# Patient Record
Sex: Male | Born: 1935 | Race: White | Hispanic: No | Marital: Married | Smoking: Former smoker
Health system: Southern US, Community
[De-identification: ages and names within clinical notes are randomized; demographics above are authoritative.]

## PROBLEM LIST (undated history)

## (undated) DIAGNOSIS — I639 Cerebral infarction, unspecified: Secondary | ICD-10-CM

## (undated) DIAGNOSIS — C4490 Unspecified malignant neoplasm of skin, unspecified: Secondary | ICD-10-CM

## (undated) DIAGNOSIS — IMO0002 Reserved for concepts with insufficient information to code with codable children: Secondary | ICD-10-CM

## (undated) DIAGNOSIS — I2510 Atherosclerotic heart disease of native coronary artery without angina pectoris: Secondary | ICD-10-CM

## (undated) DIAGNOSIS — Z8719 Personal history of other diseases of the digestive system: Secondary | ICD-10-CM

## (undated) DIAGNOSIS — Z8673 Personal history of transient ischemic attack (TIA), and cerebral infarction without residual deficits: Secondary | ICD-10-CM

## (undated) DIAGNOSIS — L309 Dermatitis, unspecified: Secondary | ICD-10-CM

## (undated) DIAGNOSIS — I509 Heart failure, unspecified: Secondary | ICD-10-CM

## (undated) DIAGNOSIS — I10 Essential (primary) hypertension: Secondary | ICD-10-CM

## (undated) DIAGNOSIS — R945 Abnormal results of liver function studies: Secondary | ICD-10-CM

## (undated) DIAGNOSIS — I499 Cardiac arrhythmia, unspecified: Secondary | ICD-10-CM

## (undated) DIAGNOSIS — R7989 Other specified abnormal findings of blood chemistry: Secondary | ICD-10-CM

## (undated) DIAGNOSIS — J189 Pneumonia, unspecified organism: Secondary | ICD-10-CM

## (undated) DIAGNOSIS — I4891 Unspecified atrial fibrillation: Secondary | ICD-10-CM

## (undated) DIAGNOSIS — E785 Hyperlipidemia, unspecified: Secondary | ICD-10-CM

## (undated) HISTORY — DX: Abnormal results of liver function studies: R94.5

## (undated) HISTORY — DX: Essential (primary) hypertension: I10

## (undated) HISTORY — PX: TONSILLECTOMY: SUR1361

## (undated) HISTORY — DX: Personal history of other diseases of the digestive system: Z87.19

## (undated) HISTORY — DX: Cardiac arrhythmia, unspecified: I49.9

## (undated) HISTORY — DX: Dermatitis, unspecified: L30.9

## (undated) HISTORY — DX: Other specified abnormal findings of blood chemistry: R79.89

## (undated) HISTORY — DX: Unspecified malignant neoplasm of skin, unspecified: C44.90

## (undated) HISTORY — DX: Unspecified atrial fibrillation: I48.91

## (undated) HISTORY — DX: Cerebral infarction, unspecified: I63.9

## (undated) HISTORY — DX: Hyperlipidemia, unspecified: E78.5

## (undated) HISTORY — DX: Reserved for concepts with insufficient information to code with codable children: IMO0002

## (undated) HISTORY — DX: Heart failure, unspecified: I50.9

## (undated) HISTORY — PX: APPENDECTOMY: SHX54

## (undated) HISTORY — DX: Personal history of transient ischemic attack (TIA), and cerebral infarction without residual deficits: Z86.73

## (undated) HISTORY — DX: Atherosclerotic heart disease of native coronary artery without angina pectoris: I25.10

---

## 1997-10-17 HISTORY — PX: CORONARY ANGIOPLASTY: SHX604

## 1998-04-18 ENCOUNTER — Ambulatory Visit (HOSPITAL_COMMUNITY): Admission: RE | Admit: 1998-04-18 | Discharge: 1998-04-18 | Payer: Self-pay | Admitting: Dentistry

## 1998-06-15 ENCOUNTER — Inpatient Hospital Stay (HOSPITAL_COMMUNITY): Admission: EM | Admit: 1998-06-15 | Discharge: 1998-06-18 | Payer: Self-pay | Admitting: Internal Medicine

## 1999-04-08 ENCOUNTER — Observation Stay (HOSPITAL_COMMUNITY): Admission: AD | Admit: 1999-04-08 | Discharge: 1999-04-09 | Payer: Self-pay | Admitting: Cardiology

## 1999-04-08 HISTORY — PX: CORONARY ANGIOPLASTY: SHX604

## 1999-04-08 HISTORY — PX: CARDIAC CATHETERIZATION: SHX172

## 1999-04-08 HISTORY — PX: CORONARY ANGIOPLASTY WITH STENT PLACEMENT: SHX49

## 2002-05-04 ENCOUNTER — Encounter: Admission: RE | Admit: 2002-05-04 | Discharge: 2002-05-04 | Payer: Self-pay | Admitting: Urology

## 2002-05-04 ENCOUNTER — Encounter: Payer: Self-pay | Admitting: Urology

## 2003-08-29 ENCOUNTER — Encounter: Payer: Self-pay | Admitting: Internal Medicine

## 2003-11-18 DIAGNOSIS — Z8673 Personal history of transient ischemic attack (TIA), and cerebral infarction without residual deficits: Secondary | ICD-10-CM

## 2003-11-18 HISTORY — DX: Personal history of transient ischemic attack (TIA), and cerebral infarction without residual deficits: Z86.73

## 2004-07-14 ENCOUNTER — Emergency Department (HOSPITAL_COMMUNITY): Admission: EM | Admit: 2004-07-14 | Discharge: 2004-07-14 | Payer: Self-pay | Admitting: Emergency Medicine

## 2004-09-25 ENCOUNTER — Ambulatory Visit: Payer: Self-pay | Admitting: Internal Medicine

## 2004-10-08 ENCOUNTER — Encounter: Admission: RE | Admit: 2004-10-08 | Discharge: 2004-10-08 | Payer: Self-pay | Admitting: Cardiology

## 2004-10-09 ENCOUNTER — Ambulatory Visit (HOSPITAL_COMMUNITY): Admission: RE | Admit: 2004-10-09 | Discharge: 2004-10-09 | Payer: Self-pay | Admitting: Cardiology

## 2005-06-23 ENCOUNTER — Ambulatory Visit: Payer: Self-pay | Admitting: Internal Medicine

## 2005-06-25 ENCOUNTER — Ambulatory Visit: Payer: Self-pay | Admitting: Internal Medicine

## 2005-07-01 ENCOUNTER — Ambulatory Visit: Payer: Self-pay | Admitting: Internal Medicine

## 2006-03-25 ENCOUNTER — Ambulatory Visit: Payer: Self-pay | Admitting: Internal Medicine

## 2006-11-13 ENCOUNTER — Ambulatory Visit: Payer: Self-pay | Admitting: Internal Medicine

## 2007-08-17 ENCOUNTER — Encounter: Payer: Self-pay | Admitting: Internal Medicine

## 2007-08-17 ENCOUNTER — Telehealth (INDEPENDENT_AMBULATORY_CARE_PROVIDER_SITE_OTHER): Payer: Self-pay | Admitting: *Deleted

## 2007-08-27 ENCOUNTER — Encounter: Payer: Self-pay | Admitting: Internal Medicine

## 2007-09-02 ENCOUNTER — Ambulatory Visit: Payer: Self-pay | Admitting: Internal Medicine

## 2007-09-02 DIAGNOSIS — I4891 Unspecified atrial fibrillation: Secondary | ICD-10-CM | POA: Insufficient documentation

## 2007-09-02 DIAGNOSIS — I10 Essential (primary) hypertension: Secondary | ICD-10-CM | POA: Insufficient documentation

## 2007-09-02 DIAGNOSIS — I252 Old myocardial infarction: Secondary | ICD-10-CM | POA: Insufficient documentation

## 2007-09-02 DIAGNOSIS — E785 Hyperlipidemia, unspecified: Secondary | ICD-10-CM | POA: Insufficient documentation

## 2007-09-03 ENCOUNTER — Ambulatory Visit: Payer: Self-pay | Admitting: Internal Medicine

## 2007-09-06 ENCOUNTER — Telehealth: Payer: Self-pay | Admitting: Internal Medicine

## 2007-09-07 ENCOUNTER — Ambulatory Visit: Payer: Self-pay | Admitting: Internal Medicine

## 2007-09-07 LAB — CONVERTED CEMR LAB
ALT: 24 units/L (ref 0–53)
AST: 30 units/L (ref 0–37)
Albumin: 3.2 g/dL — ABNORMAL LOW (ref 3.5–5.2)
Alkaline Phosphatase: 66 units/L (ref 39–117)
BUN: 25 mg/dL — ABNORMAL HIGH (ref 6–23)
Bacteria, UA: NEGATIVE
Basophils Absolute: 0 10*3/uL (ref 0.0–0.1)
Basophils Relative: 0.8 % (ref 0.0–1.0)
Bilirubin Urine: NEGATIVE
Bilirubin, Direct: 0.2 mg/dL (ref 0.0–0.3)
CO2: 32 meq/L (ref 19–32)
Calcium: 9 mg/dL (ref 8.4–10.5)
Chloride: 107 meq/L (ref 96–112)
Cholesterol: 117 mg/dL (ref 0–200)
Creatinine, Ser: 1.3 mg/dL (ref 0.4–1.5)
Crystals: NEGATIVE
Eosinophils Absolute: 0.1 10*3/uL (ref 0.0–0.6)
Eosinophils Relative: 2.4 % (ref 0.0–5.0)
GFR calc Af Amer: 68 mL/min
GFR calc non Af Amer: 56 mL/min
Glucose, Bld: 104 mg/dL — ABNORMAL HIGH (ref 70–99)
HCT: 42.1 % (ref 39.0–52.0)
HDL: 51.3 mg/dL (ref >39.0)
Hemoglobin: 14.1 g/dL (ref 13.0–17.0)
Ketones, ur: NEGATIVE mg/dL
LDL Cholesterol: 58 mg/dL (ref 0–99)
Leukocytes, UA: NEGATIVE
Lymphocytes Relative: 27.1 % (ref 12.0–46.0)
MCHC: 33.5 g/dL (ref 30.0–36.0)
MCV: 95.2 fL (ref 78.0–100.0)
Monocytes Absolute: 1.1 10*3/uL — ABNORMAL HIGH (ref 0.2–0.7)
Monocytes Relative: 17.5 % — ABNORMAL HIGH (ref 3.0–11.0)
Neutro Abs: 3.3 10*3/uL (ref 1.4–7.7)
Neutrophils Relative %: 52.2 % (ref 43.0–77.0)
Nitrite: NEGATIVE
Platelets: 181 10*3/uL (ref 150–400)
Potassium: 4.9 meq/L (ref 3.5–5.1)
RBC: 4.42 M/uL (ref 4.22–5.81)
RDW: 14.2 % (ref 11.5–14.6)
Sodium: 144 meq/L (ref 135–145)
Specific Gravity, Urine: 1.025 (ref 1.000–1.03)
TSH: 1.43 microintl units/mL (ref 0.35–5.50)
Total Bilirubin: 1 mg/dL (ref 0.3–1.2)
Total CHOL/HDL Ratio: 2.3
Total Protein, Urine: 30 mg/dL — AB
Total Protein: 6.4 g/dL (ref 6.0–8.3)
Triglycerides: 37 mg/dL (ref 0–149)
Uric Acid, Serum: 5.8 mg/dL (ref 2.4–7.0)
Urine Glucose: NEGATIVE mg/dL
Urobilinogen, UA: 1 (ref 0.0–1.0)
VLDL: 7 mg/dL (ref 0–40)
WBC: 6.2 10*3/uL (ref 4.5–10.5)
pH: 6.5 (ref 5.0–8.0)

## 2007-09-08 ENCOUNTER — Encounter (INDEPENDENT_AMBULATORY_CARE_PROVIDER_SITE_OTHER): Payer: Self-pay | Admitting: *Deleted

## 2007-09-08 ENCOUNTER — Encounter: Payer: Self-pay | Admitting: Internal Medicine

## 2007-09-21 ENCOUNTER — Encounter: Admission: RE | Admit: 2007-09-21 | Discharge: 2007-09-21 | Payer: Self-pay | Admitting: Internal Medicine

## 2007-09-22 ENCOUNTER — Telehealth (INDEPENDENT_AMBULATORY_CARE_PROVIDER_SITE_OTHER): Payer: Self-pay | Admitting: *Deleted

## 2007-09-22 ENCOUNTER — Encounter (INDEPENDENT_AMBULATORY_CARE_PROVIDER_SITE_OTHER): Payer: Self-pay | Admitting: *Deleted

## 2007-12-16 ENCOUNTER — Telehealth: Payer: Self-pay | Admitting: Internal Medicine

## 2008-01-04 ENCOUNTER — Telehealth (INDEPENDENT_AMBULATORY_CARE_PROVIDER_SITE_OTHER): Payer: Self-pay | Admitting: *Deleted

## 2008-05-15 ENCOUNTER — Telehealth (INDEPENDENT_AMBULATORY_CARE_PROVIDER_SITE_OTHER): Payer: Self-pay | Admitting: *Deleted

## 2008-05-17 ENCOUNTER — Ambulatory Visit: Payer: Self-pay | Admitting: Internal Medicine

## 2008-05-17 DIAGNOSIS — M545 Low back pain: Secondary | ICD-10-CM

## 2008-08-31 ENCOUNTER — Telehealth (INDEPENDENT_AMBULATORY_CARE_PROVIDER_SITE_OTHER): Payer: Self-pay | Admitting: *Deleted

## 2008-09-05 ENCOUNTER — Encounter (INDEPENDENT_AMBULATORY_CARE_PROVIDER_SITE_OTHER): Payer: Self-pay | Admitting: *Deleted

## 2008-09-06 ENCOUNTER — Ambulatory Visit: Payer: Self-pay | Admitting: Internal Medicine

## 2008-09-11 ENCOUNTER — Encounter (INDEPENDENT_AMBULATORY_CARE_PROVIDER_SITE_OTHER): Payer: Self-pay | Admitting: *Deleted

## 2008-09-11 LAB — CONVERTED CEMR LAB
Albumin: 3.6 g/dL (ref 3.5–5.2)
Alkaline Phosphatase: 73 units/L (ref 39–117)
BUN: 20 mg/dL (ref 6–23)
Cholesterol: 135 mg/dL (ref 0–200)
Eosinophils Absolute: 0.2 10*3/uL (ref 0.0–0.7)
Eosinophils Relative: 3.5 % (ref 0.0–5.0)
GFR calc Af Amer: 58 mL/min
GFR calc non Af Amer: 48 mL/min
HCT: 44.4 % (ref 39.0–52.0)
HDL: 49.4 mg/dL (ref >39.0)
MCHC: 34.1 g/dL (ref 30.0–36.0)
MCV: 94.4 fL (ref 78.0–100.0)
Monocytes Absolute: 0.8 10*3/uL (ref 0.1–1.0)
Platelets: 151 10*3/uL (ref 150–400)
Potassium: 4.9 meq/L (ref 3.5–5.1)
RDW: 14.3 % (ref 11.5–14.6)
Sodium: 143 meq/L (ref 135–145)
TSH: 1.84 microintl units/mL (ref 0.35–5.50)
Triglycerides: 65 mg/dL (ref 0–149)

## 2008-09-21 ENCOUNTER — Telehealth (INDEPENDENT_AMBULATORY_CARE_PROVIDER_SITE_OTHER): Payer: Self-pay | Admitting: *Deleted

## 2008-10-16 ENCOUNTER — Ambulatory Visit: Payer: Self-pay | Admitting: Internal Medicine

## 2008-10-16 DIAGNOSIS — G459 Transient cerebral ischemic attack, unspecified: Secondary | ICD-10-CM

## 2008-10-16 DIAGNOSIS — L259 Unspecified contact dermatitis, unspecified cause: Secondary | ICD-10-CM

## 2008-10-16 DIAGNOSIS — I2510 Atherosclerotic heart disease of native coronary artery without angina pectoris: Secondary | ICD-10-CM

## 2008-12-21 ENCOUNTER — Emergency Department (HOSPITAL_COMMUNITY): Admission: EM | Admit: 2008-12-21 | Discharge: 2008-12-21 | Payer: Self-pay | Admitting: *Deleted

## 2009-03-06 ENCOUNTER — Encounter: Payer: Self-pay | Admitting: Internal Medicine

## 2009-10-09 ENCOUNTER — Ambulatory Visit: Payer: Self-pay | Admitting: Internal Medicine

## 2009-10-12 LAB — CONVERTED CEMR LAB
Alkaline Phosphatase: 94 units/L (ref 39–117)
BUN: 20 mg/dL (ref 6–23)
Basophils Absolute: 0 10*3/uL (ref 0.0–0.1)
Bilirubin, Direct: 0.2 mg/dL (ref 0.0–0.3)
Chloride: 107 meq/L (ref 96–112)
Cholesterol: 137 mg/dL (ref 0–200)
Eosinophils Absolute: 0.2 10*3/uL (ref 0.0–0.7)
LDL Cholesterol: 75 mg/dL (ref 0–99)
Lymphocytes Relative: 29.2 % (ref 12.0–46.0)
MCHC: 33.2 g/dL (ref 30.0–36.0)
Neutrophils Relative %: 48.5 % (ref 43.0–77.0)
Platelets: 118 10*3/uL — ABNORMAL LOW (ref 150.0–400.0)
Potassium: 5.2 meq/L — ABNORMAL HIGH (ref 3.5–5.1)
RDW: 14.4 % (ref 11.5–14.6)
Triglycerides: 58 mg/dL (ref 0.0–149.0)
VLDL: 11.6 mg/dL (ref 0.0–40.0)

## 2009-10-18 ENCOUNTER — Ambulatory Visit: Payer: Self-pay | Admitting: Internal Medicine

## 2009-10-18 DIAGNOSIS — R972 Elevated prostate specific antigen [PSA]: Secondary | ICD-10-CM | POA: Insufficient documentation

## 2009-10-19 ENCOUNTER — Telehealth (INDEPENDENT_AMBULATORY_CARE_PROVIDER_SITE_OTHER): Payer: Self-pay | Admitting: *Deleted

## 2009-11-26 ENCOUNTER — Encounter: Payer: Self-pay | Admitting: Internal Medicine

## 2010-06-26 ENCOUNTER — Ambulatory Visit: Payer: Self-pay | Admitting: Cardiology

## 2010-07-24 ENCOUNTER — Ambulatory Visit: Payer: Self-pay | Admitting: Cardiology

## 2010-08-08 ENCOUNTER — Telehealth (INDEPENDENT_AMBULATORY_CARE_PROVIDER_SITE_OTHER): Payer: Self-pay | Admitting: *Deleted

## 2010-08-28 ENCOUNTER — Ambulatory Visit: Payer: Self-pay | Admitting: Cardiology

## 2010-09-27 ENCOUNTER — Ambulatory Visit: Payer: Self-pay | Admitting: Cardiology

## 2010-10-28 ENCOUNTER — Ambulatory Visit: Payer: Self-pay | Admitting: Cardiology

## 2010-10-29 ENCOUNTER — Ambulatory Visit: Payer: Self-pay | Admitting: Internal Medicine

## 2010-10-29 ENCOUNTER — Telehealth (INDEPENDENT_AMBULATORY_CARE_PROVIDER_SITE_OTHER): Payer: Self-pay | Admitting: *Deleted

## 2010-10-29 LAB — CONVERTED CEMR LAB
Bilirubin Urine: NEGATIVE
Blood in Urine, dipstick: NEGATIVE
Glucose, Urine, Semiquant: NEGATIVE
Protein, U semiquant: NEGATIVE
Specific Gravity, Urine: 1.015
pH: 5.5

## 2010-10-30 ENCOUNTER — Telehealth: Payer: Self-pay | Admitting: Internal Medicine

## 2010-10-30 ENCOUNTER — Ambulatory Visit: Payer: Self-pay | Admitting: Internal Medicine

## 2010-10-30 LAB — CONVERTED CEMR LAB: Potassium: 4.9 meq/L (ref 3.5–5.1)

## 2010-11-05 ENCOUNTER — Encounter: Payer: Self-pay | Admitting: Internal Medicine

## 2010-11-05 ENCOUNTER — Ambulatory Visit: Payer: Self-pay | Admitting: Internal Medicine

## 2010-11-05 DIAGNOSIS — Z85828 Personal history of other malignant neoplasm of skin: Secondary | ICD-10-CM

## 2010-11-27 ENCOUNTER — Ambulatory Visit: Payer: Self-pay | Admitting: Cardiology

## 2010-12-19 NOTE — Assessment & Plan Note (Signed)
Summary: YEARLY EXAM///SPH   Vital Signs:  Patient profile:   77 year old male Height:      72.75 inches Weight:      206.4 pounds BMI:     27.52 Temp:     97.7 degrees F oral Pulse rate:   54 / minute Resp:     14 per minute BP sitting:   134 / 80  (left arm) Cuff size:   large  Vitals Entered By: Shonna Chock CMA (November 05, 2010 1:10 PM) CC: CPX and discuss labs (copy given)    CC:  CPX and discuss labs (copy given) .  History of Present Illness:    Here for Medicare AWV: 1.Risk factors based on Past M, S, F history:see Diagnoses( chart updated) 2.Physical Activities: treadmill ,Abmaster, weights 60 min 4X/week. 3.Depression/mood: no issues 4.Hearing: whisper  heard @ 6 ft  5.ADL's: no limitations  6.Fall Risk: no imbalance or co-ordination issues 7.Home Safety: home safety proofed 8.Height, weight, &visual acuity:wall chart read @ 6 ft w/o lenses 9.Counseling: POA & Living Will in place 10.Labs ordered based on risk factors:results reviewed  11. Referral Coordination: none requested 12. Care Plan:see Instructions 13. Cognitive Assessment: Oriented X 3; memory & recall good  ; "WORLD" spelled backwards;mood & affect normal.       Hyperlipidemia Follow-Up:  The patient reports intermittent UE  muscle aches and itching (from eczema0, but denies GI upset, abdominal pain, flushing, constipation, diarrhea, and fatigue.  Other symptoms include occasional  pedal edema.  The patient denies the following symptoms: chest pain/pressure, exercise intolerance, dypsnea, palpitations, and syncope.  Compliance with medications (by patient report) has been near 100%.  Dietary compliance has been excellent.         Hypertension Follow-Up: The patient denies lightheadedness, urinary frequency, and headaches.  Compliance with medications (by patient report) has been near 100%.  Adjunctive measures currently used by the patient include salt restriction.  BP well controlled @ MD  visits.  Preventive Screening-Counseling & Management  Alcohol-Tobacco     Alcohol drinks/day: 3     Smoking Status: quit > 6 months     Year Started: 1959     Year Quit: 1970     Cigars/week: 5-6/week  Caffeine-Diet-Exercise     Caffeine use/day: < 1 /day  Hep-HIV-STD-Contraception     Dental Visit-last 6 months yes     Sun Exposure-Excessive: no  Safety-Violence-Falls     Seat Belt Use: yes      Blood Transfusions:  no.        Travel History:  [place]   09/2010.    Current Medications (verified): 1)  Coumadin 5 Mg  Tabs (Warfarin Sodium) .Marland Kitchen.. 1 By Mouth Qd 2)  Toprol Xl 100 Mg  Tb24 (Metoprolol Succinate) .... 1/2 Tab Qd 3)  Lipitor 10 Mg  Tabs (Atorvastatin Calcium) .Marland Kitchen.. 1 By Mouth Qd 4)  Advil 200 Mg Tabs (Ibuprofen) .Marland Kitchen.. 1 By Mouth As Needed  Allergies: 1)  ! Pcn  Past History:  Past Medical History: Eczema complicated by cellulitis 1999 ; elevated PSA, PMH of  (790.93), Dr Vonita Moss, Urology ( 4.2 in 2003) Atrial fibrillation Hyperlipidemia: NMR Lipoprofile: LDL 77( 1187/569), HDL 48, TG 65.LDL goal = < 100, ideally < 70. Hypertension Myocardial infarction, PMH  of X 2 in  1996 & 1998; ? Transient ischemic attack, PMH  of  2005, presentation  as diplopia Skin cancer,PMH of "pre Melanoma", Dr Donzetta Starch  Past Surgical History: Appendectomy; Atherectomy 1995, Dr  Delfin Edis; PTCA/stent 1999; Virtual colonoscopy negative  2009 Tonsillectomy  Family History: Father:died of  PNA @ 71 Mother: negative , d @ 68 Siblings: bro: MI in 18s  Social History: no diet Occupation: Psychologist, educational of 2 companies Married Alcohol use-yes: socially Regular exercise-yes: treadmill 30 min 3-4X/week, weights, Ab  Master Former Smoker: cigars in his 30s Smoking Status:  quit > 6 months Caffeine use/day:  < 1 /day Dental Care w/in 6 mos.:  yes Sun Exposure-Excessive:  no Seat Belt Use:  yes Blood Transfusions:  no  Review of Systems  The patient denies anorexia,  fever, weight loss, weight gain, hoarseness, prolonged cough, hemoptysis, melena, hematochezia, severe indigestion/heartburn, hematuria, suspicious skin lesions, unusual weight change, enlarged lymph nodes, and angioedema.    Physical Exam  General:  Appears younger than age,well-nourished; alert,appropriate and cooperative throughout examination Head:  Normocephalic and atraumatic without obvious abnormalities.  Eyes:  No corneal or conjunctival inflammation noted. EOMI. Perrla. Funduscopic exam benign, without hemorrhages, exudates or papilledema. Vision grossly normal. Ears:  External ear exam shows no significant lesions or deformities.  Otoscopic examination reveals clear canals, tympanic membranes are intact bilaterally without bulging, retraction, inflammation or discharge. Hearing is grossly normal bilaterally. Nose:  External nasal examination shows no deformity or inflammation. Nasal mucosa are pink and moist without lesions or exudates. Septal dislocation & deviation Mouth:  Oral mucosa and oropharynx without lesions or exudates.  Teeth in good repair. Neck:  No deformities, masses, or tenderness noted. Lungs:  Normal respiratory effort, chest expands symmetrically. Lungs are clear to auscultation, no crackles or wheezes. Heart:  bradycardia,MINIMALLY  irregular rhythm, and grade1.5- 2  /6 systolic murmur.   Abdomen:  Bowel sounds positive,abdomen soft and non-tender without masses, organomegaly or hernias noted. Prostate:  see PSA  Msk:  No deformity or scoliosis noted of thoracic or lumbar spine.   Pulses:  R and L carotid,radial,dorsalis pedis and posterior tibial pulses are full and equal bilaterally Extremities:  No clubbing or  cyanosis. 1+ ankle  edema. Contractions of 2 nd toes with callus formation.   Minimal crepitus of knees  Neurologic:  alert & oriented X3 and DTRs symmetrical  but 1/2+ Skin:  Diffuse low grade Eczema Cervical Nodes:  No lymphadenopathy noted Axillary  Nodes:  No palpable lymphadenopathy Psych:  memory intact for recent and remote, normally interactive, and good eye contact.     Impression & Recommendations:  Problem # 1:  PREVENTIVE HEALTH CARE (ICD-V70.0)  Orders: Medicare -1st Annual Wellness Visit [phone])  Problem # 2:  HYPERLIPIDEMIA (ICD-272.4) LDL @ goal of < 100 His updated medication list for this problem includes:    Lipitor 10 Mg Tabs (Atorvastatin calcium) .Marland Kitchen... 1 by mouth qd  Problem # 3:  HYPERTENSION (ICD-401.9) controlled His updated medication list for this problem includes:    Toprol Xl 100 Mg Tb24 (Metoprolol succinate) .Marland Kitchen... 1/2 tab qd  Problem # 4:  PSA, INCREASED (ICD-790.93) minimally elevated vs 2003 (4.2). No further evaluation indicated  Complete Medication List: 1)  Coumadin 5 Mg Tabs (Warfarin sodium) .Marland Kitchen.. 1 by mouth qd 2)  Toprol Xl 100 Mg Tb24 (Metoprolol succinate) .... 1/2 tab qd 3)  Lipitor 10 Mg Tabs (Atorvastatin calcium) .Marland Kitchen.. 1 by mouth qd 4)  Advil 200 Mg Tabs (Ibuprofen) .Marland Kitchen.. 1 by mouth as needed  Patient Instructions: 1)  Drink to thirst , up to 40 oz of water / day.  Monitor of PSA is not needed. Consider Tai Chi if you are concerned  about balance. 2)  Check your Blood Pressure regularly. If it is above: 135/85 ON AVERAGE you should make an appointment.If muscle aches persist , please have Vitamin D level & CPK drawn to assess cause(Code: 729.1).   Orders Added: 1)  Medicare -1st Annual Wellness Visit [G0438] 2)  Est. Patient Level III [16109]     Appended Document: YEARLY EXAM///SPH   Immunizations Administered:  Influenza Vaccine # 1:    Vaccine Type: Fluvax Non-MCR    Site: left deltoid    Mfr: Sanofi Pasteur    Dose: 0.5 ml    Route: IM    Given by: Shonna Chock CMA    Exp. Date: 05/17/2011    Lot #: [id number]   Laboratory Results    Stool - Occult Blood Hemmoccult #1: negative Hemoccult #2: negative Hemoccult #3: negative  Kit Test Internal QC: Positive    (Normal Range: Negative)

## 2010-12-19 NOTE — Progress Notes (Signed)
Summary: K+ results  Phone Note Call from Patient Call back at [phone]   Summary of Call: Patient spouse was notified of his K+ results today and they would like to know what was the cause of this and what do you recommend. Please advise. Initial call taken by: Lucious Groves CMA,  October 30, 2010 3:20 PM  Follow-up for Phone Call        Much improved ; avoid citrus or K+ containing products. Bring in all supplements to appt. Kidney function is normal. Follow-up by: Marga Melnick MD,  October 30, 2010 3:42 PM  Additional Follow-up for Phone Call Additional follow up Details #1::        Patient notified. Lucious Groves CMA  October 30, 2010 3:52 PM

## 2010-12-19 NOTE — Progress Notes (Signed)
Summary: ORDERS FOR 12/5 LAB??  Phone Note Call from Patient   Summary of Call: PATIENT HAS YEARLY EXAM FOR 12/12---WHAT ORDERS DO I NEED FOR 12/5 LAB??  ADVISED PT TO CHECK WITH INS TO SEE IF THEY WILL PAY FOR LABS BEFORE PHYSICAL Initial call taken by: Jerolyn Shin,  August 08, 2010 11:05 AM  Follow-up for Phone Call        Lipid/ Hep 272.4/995.20, BMP 401.9, CBCD,TSH, PSA, Stool Cards, Udip V70.0 Follow-up by: Shonna Chock CMA,  August 08, 2010 1:27 PM  Additional Follow-up for Phone Call Additional follow up Details #1::        ENTERED INFO FOR LABS Additional Follow-up by: Jerolyn Shin,  August 12, 2010 10:33 AM

## 2010-12-19 NOTE — Progress Notes (Signed)
Summary: Lab Concerns  Phone Note Outgoing Call Call back at Home Phone [phone]   Call placed by: Shonna Chock CMA,  October 29, 2010 3:19 PM Call placed to: Patient Details for Reason: K+ 6.3, ref range 3.5-5.1 Summary of Call: Per Dr.Hopper contact patient and have him go to [facility] to recheck potassium tomorrow (276.7).   Left message on machine for patient to return call when avaliable, Reason for call:  Discuss Elevated potassium, Dr.Hopper reviewed med list, no meds would cause increase in potassium. Patient to have recheck tomorrow at [facility]Chrae [facility] CMA  October 29, 2010 3:21 PM   Follow-up for Phone Call        Pt aware appt scheduled...........Marland KitchenFelecia Deloach CMA  October 29, 2010 3:33 PM

## 2010-12-19 NOTE — Consult Note (Signed)
Summary: [facility]   Imported By: Lanelle Bal 12/10/2009 09:43:39  _____________________________________________________________________  External Attachment:    Type:   Image     Comment:   External Document

## 2010-12-19 NOTE — Miscellaneous (Signed)
Summary: Orders Update  Clinical Lists Changes  Orders: Added new Service order of EKG w/ Interpretation (93000) - Signed 

## 2011-01-07 ENCOUNTER — Encounter (INDEPENDENT_AMBULATORY_CARE_PROVIDER_SITE_OTHER): Payer: Medicare Other

## 2011-01-07 DIAGNOSIS — Z7901 Long term (current) use of anticoagulants: Secondary | ICD-10-CM

## 2011-02-03 ENCOUNTER — Encounter: Payer: Self-pay | Admitting: *Deleted

## 2011-02-03 DIAGNOSIS — I4891 Unspecified atrial fibrillation: Secondary | ICD-10-CM

## 2011-02-04 ENCOUNTER — Ambulatory Visit (INDEPENDENT_AMBULATORY_CARE_PROVIDER_SITE_OTHER): Payer: Medicare Other | Admitting: *Deleted

## 2011-02-04 DIAGNOSIS — Z7901 Long term (current) use of anticoagulants: Secondary | ICD-10-CM

## 2011-02-04 LAB — POCT INR: INR: 1.9

## 2011-02-25 ENCOUNTER — Ambulatory Visit (INDEPENDENT_AMBULATORY_CARE_PROVIDER_SITE_OTHER): Payer: MEDICARE | Admitting: Cardiology

## 2011-02-25 ENCOUNTER — Encounter: Payer: Self-pay | Admitting: *Deleted

## 2011-02-25 ENCOUNTER — Encounter: Payer: Self-pay | Admitting: Cardiology

## 2011-02-25 VITALS — BP 128/80 | HR 45 | Wt 205.6 lb

## 2011-02-25 DIAGNOSIS — I2510 Atherosclerotic heart disease of native coronary artery without angina pectoris: Secondary | ICD-10-CM

## 2011-02-25 DIAGNOSIS — I4891 Unspecified atrial fibrillation: Secondary | ICD-10-CM

## 2011-02-25 NOTE — Progress Notes (Signed)
Arthur [name] is seen today for followup visit. He had a history of atherectomy of the left anterior descending in 1993 after anterior septal myocardial infarction. He had inferior myocardial infarction in December of 1998 with angioplasty right coronary artery. He subsequently had repeat catheterization in may of 2000 and we stented the proximal left anterior descending at that time. Overall, he has done well since then with no recurrence of angina.  He developed atrial fibrillation in 1995 and has been on warfarin anticoagulation since that time. He's been on beta blockers because of his ischemic heart disease and is gradually has slowing of his ventricular response to his atrial fibrillation. In 2010, he had a Holter monitor which was satisfactory but did have a slow ventricular response. He remains asymptomatic but today on his EKG, he is a heart rate of 45.   Current Outpatient Prescriptions  Medication Sig Dispense Refill  . atorvastatin (LIPITOR) 10 MG tablet Take 10 mg by mouth daily.        Marland Kitchen ibuprofen (ADVIL,MOTRIN) 200 MG tablet Take 200 mg by mouth every 6 (six) hours as needed.        . metoprolol (TOPROL-XL) 50 MG 24 hr tablet Take 50 mg by mouth daily. 1 tablet AM and 25mg  QHS       . warfarin (COUMADIN) 5 MG tablet Take 5 mg by mouth daily.          Allergies  Allergen Reactions  . Penicillins     Patient Active Problem List  Diagnoses  . HYPERLIPIDEMIA  . HYPERTENSION  . MYOCARDIAL INFARCTION, HX OF  . C A D  . ATRIAL FIBRILLATION  . ECZEMA  . LOW BACK PAIN, MILD  . PSA, INCREASED  . TRANSIENT ISCHEMIC ATTACK, HX OF  . SKIN CANCER, HX OF    History  Smoking status  . Former Smoker  . Quit date: 11/18/1967  Smokeless tobacco  . Never Used    History  Alcohol Use  . Yes    Family History  Problem Relation Age of Onset  . Heart attack Mother 56    MI  . Pneumonia Father     Review of Systems:   The patient denies any heat or cold intolerance.  No weight  gain or weight loss.  The patient denies headaches or blurry vision.  There is no cough or sputum production.  The patient denies dizziness.  There is no hematuria or hematochezia.  The patient denies any muscle aches or arthritis.  The patient denies any rash.  The patient denies frequent falling or instability.  There is no history of depression or anxiety.  All other systems were reviewed and are negative.   Physical Exam:   His weight is 205. Blood pressure is 150/80 sitting, 120/80 standing, heart rate is 45 and irregular he is in atrial fibrillation at 45 he may have a nodal escape rhythm.The head is normocephalic and atraumatic.  Pupils are equally round and reactive to light.  Sclerae nonicteric.  Conjunctiva is clear.  Oropharynx is unremarkable.  There's adequate oral airway.  Neck is supple there are no masses.  Thyroid is not enlarged.  There is no lymphadenopathy.  Lungs are clear.  Chest is symmetric.  Heart shows a regular rate and rhythm.  S1 and S2 are normal.  There is no murmur click or gallop.  Abdomen is soft normal bowel sounds.  There is no organomegaly.  Genital and rectal deferred.  Extremities are without edema.  Peripheral pulses  are adequate.  Neurologically intact.  Full range of motion.  The patient is not depressed.  Skin is warm and dry.  Assessment / Plan:

## 2011-02-25 NOTE — Assessment & Plan Note (Signed)
We'll continue his warfarin anticoagulation. I'll decrease his Toprol 25 mg per day. I will see him again in one month to assess his underlying rhythm and ventricular response to his atrial fibrillation.

## 2011-02-25 NOTE — Assessment & Plan Note (Signed)
We'll continue to monitor him for recurrent ischemia. It's been 12 years since he had a cardiac catheterization. His last stress Cardiolite study was in 2010 but overall he's been satisfactory. He's not had any syncope from his atrial fibrillation I will try to decrease his Toprol to 25 mg per day. I will have him see Dr. Excell Seltzer in followup in 6 months but I will see him back in one month to make sure he is tolerating the decrease in beta blockers.

## 2011-03-03 ENCOUNTER — Other Ambulatory Visit: Payer: Self-pay | Admitting: *Deleted

## 2011-03-03 DIAGNOSIS — I4891 Unspecified atrial fibrillation: Secondary | ICD-10-CM

## 2011-03-03 MED ORDER — WARFARIN SODIUM 5 MG PO TABS: 5.0000 mg | ORAL_TABLET | Freq: Every day | ORAL | Status: DC

## 2011-03-04 ENCOUNTER — Ambulatory Visit (INDEPENDENT_AMBULATORY_CARE_PROVIDER_SITE_OTHER): Payer: MEDICARE | Admitting: *Deleted

## 2011-03-04 DIAGNOSIS — I4891 Unspecified atrial fibrillation: Secondary | ICD-10-CM

## 2011-03-04 LAB — POCT INR: INR: 2.5

## 2011-03-06 ENCOUNTER — Encounter: Payer: Self-pay | Admitting: Cardiology

## 2011-03-21 ENCOUNTER — Other Ambulatory Visit (HOSPITAL_COMMUNITY): Payer: Self-pay | Admitting: Surgery

## 2011-03-21 DIAGNOSIS — D0362 Melanoma in situ of left upper limb, including shoulder: Secondary | ICD-10-CM

## 2011-03-24 ENCOUNTER — Telehealth: Payer: Self-pay | Admitting: Cardiology

## 2011-03-24 ENCOUNTER — Other Ambulatory Visit (HOSPITAL_COMMUNITY): Payer: Self-pay | Admitting: Surgery

## 2011-03-24 ENCOUNTER — Encounter (HOSPITAL_COMMUNITY)
Admission: RE | Admit: 2011-03-24 | Discharge: 2011-03-24 | Disposition: A | Discharge status: Home / Self Care | Payer: MEDICARE | Source: Ambulatory Visit | Attending: Surgery | Admitting: Surgery

## 2011-03-24 DIAGNOSIS — C4360 Malignant melanoma of unspecified upper limb, including shoulder: Secondary | ICD-10-CM

## 2011-03-24 DIAGNOSIS — D0362 Melanoma in situ of left upper limb, including shoulder: Secondary | ICD-10-CM

## 2011-03-24 MED ORDER — TECHNETIUM TC 99M SULFUR COLLOID FILTERED
0.5000 | Freq: Once | INTRAVENOUS | Status: AC | PRN
  Administered 2011-03-24: 0.5 via INTRADERMAL

## 2011-03-26 ENCOUNTER — Encounter: Payer: Self-pay | Admitting: Cardiology

## 2011-03-26 ENCOUNTER — Ambulatory Visit (INDEPENDENT_AMBULATORY_CARE_PROVIDER_SITE_OTHER): Payer: MEDICARE | Admitting: Cardiology

## 2011-03-26 DIAGNOSIS — I2510 Atherosclerotic heart disease of native coronary artery without angina pectoris: Secondary | ICD-10-CM

## 2011-03-26 DIAGNOSIS — I4891 Unspecified atrial fibrillation: Secondary | ICD-10-CM

## 2011-03-26 NOTE — Assessment & Plan Note (Signed)
His atrial fibrillation as been steady. By decreasing his beta blocker, he has less bradycardia. I think is reasonable to continue to follow him along at this point in time. He may need followup echocardiogram in 6-8 months

## 2011-03-26 NOTE — Progress Notes (Signed)
Subjective:   Arthur [name] comes in today for followup visit. He has chronic atrial fibrillation as well as ischemic heart disease. I've recently decrease his Toprol to 25 mg per day and he seen back for followup. Overall, he's doing well from a cardiac standpoint of his heart rate has increased to 56 beats per minute. Since his last visit, he seen a dermatologist and has a newly discovered melanoma on his left forearm. He is planned surgical excision in approximately one week.  He has known ischemic heart disease. He had an atherectomy of the left anterior descending in 1993 after anterior septal myocardial infarction. He had an inferior myocardial infarction December 1998 with angioplasty the right coronary artery. In may of 2000, stent was placed in his proximal left anterior descending. He has done well since that time. His history of hypercholesterolemia, a known gallstone, history of TIA in 2006, a history of hypertension.  Current Outpatient Prescriptions  Medication Sig Dispense Refill  . atorvastatin (LIPITOR) 10 MG tablet Take 10 mg by mouth daily.        Marland Kitchen ibuprofen (ADVIL,MOTRIN) 200 MG tablet Take 200 mg by mouth as needed. occasionally      . metoprolol (TOPROL-XL) 50 MG 24 hr tablet Take 25 mg by mouth daily.       Marland Kitchen warfarin (COUMADIN) 5 MG tablet Take 1 tablet (5 mg total) by mouth daily.  90 tablet  3    Allergies  Allergen Reactions  . Penicillins     Patient Active Problem List  Diagnoses  . HYPERLIPIDEMIA  . HYPERTENSION  . MYOCARDIAL INFARCTION, HX OF  . C A D  . ATRIAL FIBRILLATION  . ECZEMA  . LOW BACK PAIN, MILD  . PSA, INCREASED  . TRANSIENT ISCHEMIC ATTACK, HX OF  . SKIN CANCER, HX OF    History  Smoking status  . Former Smoker  . Types: Cigarettes, Cigars  . Quit date: 11/18/1967  Smokeless tobacco  . Never Used  Comment: 1-2 cigars a day    History  Alcohol Use  . Yes    2 glasses of wine daily    Family History  Problem Relation Age of Onset    . Heart attack Mother 80    MI  . Pneumonia Father     Review of Systems:   The patient denies any heat or cold intolerance.  No weight gain or weight loss.  The patient denies headaches or blurry vision.  There is no cough or sputum production.  The patient denies dizziness.  There is no hematuria or hematochezia.  The patient denies any muscle aches or arthritis.  The patient denies any rash.  The patient denies frequent falling or instability.  There is no history of depression or anxiety.  All other systems were reviewed and are negative.   Physical Exam:   He is in atrial fibrillation and has been a regular rate and rhythm. Heart rate is 56 vital signs were reviewed.The head is normocephalic and atraumatic.  Pupils are equally round and reactive to light.  Sclerae nonicteric.  Conjunctiva is clear.  Oropharynx is unremarkable.  There's adequate oral airway.  Neck is supple there are no masses.  Thyroid is not enlarged.  There is no lymphadenopathy.  Lungs are clear.  Chest is symmetric.  Heart shows air regular rate and rhythm.  S1 and S2 are normal.  There is no murmur click or gallop.  Abdomen is soft normal bowel sounds.  There is no organomegaly.  Genital and rectal deferred.  Extremities are without edema. Biopsy lesion is noted on the left forearm. Peripheral pulses are adequate.  Neurologically intact.  Full range of motion.  The patient is not depressed.  Skin is warm and dry. Assessment / Plan:

## 2011-03-26 NOTE — Assessment & Plan Note (Signed)
Overall, he is tall well for established coronary disease. I will have him see Dr. Excell Seltzer in followup. His last stress Cardiolite study was in April of 2010.

## 2011-03-28 ENCOUNTER — Telehealth: Payer: Self-pay | Admitting: Cardiology

## 2011-03-28 NOTE — Telephone Encounter (Signed)
[facility], OFFICE ,EKG, STRESS, ECHO, CHEST XRAY,    FAX [phone]  SURG. WED 04/02/11

## 2011-04-01 ENCOUNTER — Encounter: Payer: Medicare Other | Admitting: *Deleted

## 2011-04-01 ENCOUNTER — Ambulatory Visit
Admission: RE | Admit: 2011-04-01 | Discharge: 2011-04-01 | Disposition: A | Discharge status: Home / Self Care | Payer: MEDICARE | Source: Ambulatory Visit | Attending: Surgery | Admitting: Surgery

## 2011-04-01 ENCOUNTER — Other Ambulatory Visit: Payer: Self-pay | Admitting: Surgery

## 2011-04-01 ENCOUNTER — Encounter (HOSPITAL_BASED_OUTPATIENT_CLINIC_OR_DEPARTMENT_OTHER)
Admission: RE | Admit: 2011-04-01 | Discharge: 2011-04-01 | Disposition: A | Discharge status: Home / Self Care | Payer: Medicare Other | Source: Ambulatory Visit | Attending: Surgery | Admitting: Surgery

## 2011-04-01 DIAGNOSIS — Z01811 Encounter for preprocedural respiratory examination: Secondary | ICD-10-CM

## 2011-04-01 LAB — PROTIME-INR: Prothrombin Time: 21.6 seconds — ABNORMAL HIGH (ref 11.6–15.2)

## 2011-04-01 LAB — BASIC METABOLIC PANEL
Calcium: 9.5 mg/dL (ref 8.4–10.5)
GFR calc Af Amer: >60 mL/min (ref >60)
GFR calc non Af Amer: 58 mL/min — ABNORMAL LOW (ref >60)
Glucose, Bld: 84 mg/dL (ref 70–99)

## 2011-04-01 NOTE — Letter (Signed)
September 07, 2007    Colleen Can. Deborah Chalk, M.D.  [address]  [place], [place] [address]   RE:  Arthur, [name]  MRN:  [id number]  /  DOB:  May 02, 1925   Dear Arthur [name],   I saw Arthur [name] for a complete physical examination on September 02, 2007; enclosed is a copy of that dictation. I have reviewed your  correspondence concerning colonoscopy. You had categorically stated you  did not want him to come off Coumadin to permit a screening colonoscopy.  You felt that the risk of cardiovascular or neurovascular event was too  great off Coumadin.   Two of his 6 stool cards were positive for blood;he did not exhibit  anemia. A copy of his full labs were sent to him.   I will request a virtual colonoscopy be authorized by his insurance  company in view of these facts. I feel it is critical to rule out any  colon lesion in view of the Hemoccult positive stools.   If any significant lesions were identified @  virtual colonoscopy then  further evaluation would require a colonoscopy under Lovenox coverage  off coumadin in an inpatient setting.    Sincerely,      Arthur [name]. Alwyn Ren, MD,FACP,FCCP  Electronically Signed    [facility]  DD: 09/07/2007  DT: 09/08/2007  Job #: [id number]

## 2011-04-02 ENCOUNTER — Ambulatory Visit (HOSPITAL_COMMUNITY)
Admission: RE | Admit: 2011-04-02 | Discharge: 2011-04-02 | Disposition: A | Discharge status: Home / Self Care | Payer: Medicare Other | Source: Ambulatory Visit | Attending: Surgery | Admitting: Surgery

## 2011-04-02 ENCOUNTER — Other Ambulatory Visit (INDEPENDENT_AMBULATORY_CARE_PROVIDER_SITE_OTHER): Payer: Self-pay | Admitting: Surgery

## 2011-04-02 ENCOUNTER — Ambulatory Visit (HOSPITAL_BASED_OUTPATIENT_CLINIC_OR_DEPARTMENT_OTHER)
Admission: RE | Admit: 2011-04-02 | Discharge: 2011-04-02 | Disposition: A | Discharge status: Home / Self Care | Payer: Medicare Other | Source: Ambulatory Visit | Attending: Surgery | Admitting: Surgery

## 2011-04-02 DIAGNOSIS — C4360 Malignant melanoma of unspecified upper limb, including shoulder: Secondary | ICD-10-CM

## 2011-04-02 DIAGNOSIS — I10 Essential (primary) hypertension: Secondary | ICD-10-CM | POA: Insufficient documentation

## 2011-04-02 DIAGNOSIS — Z9861 Coronary angioplasty status: Secondary | ICD-10-CM | POA: Insufficient documentation

## 2011-04-02 DIAGNOSIS — I252 Old myocardial infarction: Secondary | ICD-10-CM | POA: Insufficient documentation

## 2011-04-02 DIAGNOSIS — Z01812 Encounter for preprocedural laboratory examination: Secondary | ICD-10-CM | POA: Insufficient documentation

## 2011-04-02 DIAGNOSIS — Z01810 Encounter for preprocedural cardiovascular examination: Secondary | ICD-10-CM | POA: Insufficient documentation

## 2011-04-02 DIAGNOSIS — Z01818 Encounter for other preprocedural examination: Secondary | ICD-10-CM | POA: Insufficient documentation

## 2011-04-02 LAB — POCT HEMOGLOBIN-HEMACUE: Hemoglobin: 15.4 g/dL (ref 13.0–17.0)

## 2011-04-02 MED ORDER — TECHNETIUM TC 99M SULFUR COLLOID FILTERED
0.5000 | Freq: Once | INTRAVENOUS | Status: AC | PRN
  Administered 2011-04-02: 0.5 via INTRADERMAL

## 2011-04-03 NOTE — Op Note (Addendum)
Arthur [name], [name] NO.:  [id number]  MEDICAL RECORD NO.:  [id number]  LOCATION:  [facility]                          FACILITY:  [facility]  PHYSICIAN:  Arthur Park. Daphine Deutscher, MD  DATE OF BIRTH:  [birth year]  DATE OF PROCEDURE:  04/02/2011 DATE OF DISCHARGE:                              OPERATIVE REPORT   PREOPERATIVE DIAGNOSIS:  Lentigo maligna melanoma, Clark's level IV, base involved, Breslow measurement 1.75 mm.  Lateral and deep margins were involved on the biopsy and labeled a T2B.  PROCEDURE:  Left axillary sentinel lymph node mapping and left axillary sentinel lymph node biopsy (two hot nodes).  Re-excision of melanoma with longitudinal ellipse with 1 cm margins around the melanoma necessitating full-thickness skin graft.  Skin graft harvested from the right lower quadrant abdomen.  SURGEON:  Arthur Park. Daphine Deutscher, MD  ASSISTANT:  None.  ANESTHESIA:  General endotracheal.  DESCRIPTION OF PROCEDURE:  This 77 year old gentleman was taken to room #8 at [facility] on Wednesday afternoon, Apr 02, 2011.  He was given general by LMA.  I did sentinel lymph node mapping first and mapped an area that was fairly high in the axilla.  This was marked and then the entire area was prepped with PCMX including his arm and his wrist, and draped sterilely.  After I had clipped the area prior to prepping, I went ahead and made an incision carried this up and used the Neoprobe to guide me to an area, this was actually pretty deep adjacent the subclavian vein.  I tried to stay right down on the node.  Nodal mass that I found.  I did not transect any major neurovascular pedicles and attempted to carefully just teased through the tissue putting little clips on the small tributaries leading into this nodal mass.  I removed the mass and this was hot.  There were still remained some hot activity and I went ahead and removed the second small area that was hot as well and the counts in  the axilla failed pretty much too, very low-grade background.  There was essentially no bleeding.  I inject this area with some Marcaine and closed with interrupted 4-0 Vicryl and ultimately with Dermabond.  Next, I went down and performed a re-excision of the melanoma of the arm.  I previously marked this with his assistance in the holding area and could see area that had been biopsied.  I measured about a centimeter out from that and created about a 9-cm long ellipse and made this down, then excised this down to the fascia.  I took this off with sharp dissection off the fascia and marked the suture on the distal margin.  When this was completed, I began to try to close this with 2-0 Vicryl pops, the subcu and came up, but there was too much tension and elected to that point after I had closed that with both Vicryl and nylon to measure the resultant defect and then I went up over on the right side, prepped that area on the abdomen and harvested a full-thickness mass of skin.  That was closed primarily with 4-0 Vicryl and running 4-0 nylon.  The skin graft  was then defatted and fenestrated with an 11 blade.  It was then sewn into the wound with 4-0 Vicryls all around and down in the middle of it down to the fascia.  A bolus dressing with Barry Dienes fabric and Silvadene cream was placed over that with fluffs and this was tied down.  This was then wrapped with Kerlix and then with an Ace wrap.  The patient tolerated this procedure well.  He was taken to the recovery room.  He will be given Vicodin for pain.  He will be coming back in 5 days for removal of the bolus dressing.     Arthur Park Daphine Deutscher, MD     [facility]MED[facility]  D:  04/02/2011  T:  04/03/2011  Job:  [id number]  cc:   Rocco Serene, M.D. Titus Dubin. Alwyn Ren, MD,FACP,FCCP  Electronically Signed by Luretha Murphy MD on 06/05/2011 08:45:17 AM

## 2011-04-04 ENCOUNTER — Telehealth: Payer: Self-pay | Admitting: *Deleted

## 2011-04-04 ENCOUNTER — Ambulatory Visit: Payer: Self-pay | Admitting: *Deleted

## 2011-04-04 NOTE — Telephone Encounter (Signed)
LM for pt to call back and schedule an INR appt.

## 2011-04-10 ENCOUNTER — Ambulatory Visit (INDEPENDENT_AMBULATORY_CARE_PROVIDER_SITE_OTHER): Payer: 59 | Admitting: *Deleted

## 2011-04-10 DIAGNOSIS — I4891 Unspecified atrial fibrillation: Secondary | ICD-10-CM

## 2011-04-11 ENCOUNTER — Other Ambulatory Visit (HOSPITAL_COMMUNITY): Payer: Medicare Other

## 2011-04-21 ENCOUNTER — Ambulatory Visit (INDEPENDENT_AMBULATORY_CARE_PROVIDER_SITE_OTHER): Payer: Medicare Other | Admitting: *Deleted

## 2011-04-21 DIAGNOSIS — I4891 Unspecified atrial fibrillation: Secondary | ICD-10-CM

## 2011-05-19 ENCOUNTER — Ambulatory Visit (INDEPENDENT_AMBULATORY_CARE_PROVIDER_SITE_OTHER): Payer: Medicare Other | Admitting: *Deleted

## 2011-05-19 DIAGNOSIS — I4891 Unspecified atrial fibrillation: Secondary | ICD-10-CM

## 2011-05-20 NOTE — Op Note (Addendum)
  [name], Arthur NO.:  [id number]  MEDICAL RECORD NO.:  [id number]  LOCATION:  [facility]                          FACILITY:  [facility]  PHYSICIAN:  Thornton Park. 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  PROCEDURE:  Left axillary sentinel lymph node mapping and left sentinel lymph node biopsy followed by reexcision of melanoma of left forearm, skin graft harvested from the right lower abdomen, full-thickness.  DESCRIPTION OF PROCEDURE:  This 77 year old man, previously dictated operative note, but it apparently has lost, so therefore I am re- dictating this on May 03, 2011.  [name] was taken to [facility].  Previously, he had been injected with radionucleotide and I took him back to room #8 and after prepping and draping him widely including an area to harvest possible skin graft, we prepped him with PCMX, draped him sterilely, and I mapped his axilla.  I also injected around the melanoma site with some methylene blue.  I made a small incision in the hot spot, went down and took out 2 nodal areas and sent those really as a single specimen as a hot node.  This was negative.  In the meantime, it was closed with 4-0 Vicryl and with Dermabond.  I then went down and did a wide excision measuring to get margins and required about a 9-cm long incision x 3 cm in diameter and in a longitudinal fashion where this was in the proximal forearm required a skin graft.  We went ahead and excised and got deep right down on the fascia and oriented this specimen and sent it for permanent sections.  I then harvested an ellipse of tissue from the lower abdomen on the right and closed that primarily with 4-0 Vicryl and with nylons and proceeded to defat this and get it down real thin and then fenestrated it  very well with an #11 blade.  This was then applied to the defect that was remaining after I closed either end a bit and then I sutured this down with 4-0 Vicryl and got a good application.  Owings fabric with some Silvadene cream was placed in a bolus dressing fashion over this and he was wrapped in layers to protect the graft.  The patient tolerated the procedure well, which was done under general anesthesia and there was a late case at [facility] where he did well and was discharged home. Arrangements are being made for followup to take off the bolus dressing in 5 days.     Thornton Park Daphine Deutscher, MD     MBM/MEDQ  D:  05/04/2011  T:  05/05/2011  Job:  [id number]  Electronically Signed by Luretha Murphy MD on 06/05/2011 08:45:21 AM

## 2011-05-29 ENCOUNTER — Encounter (INDEPENDENT_AMBULATORY_CARE_PROVIDER_SITE_OTHER): Payer: Self-pay | Admitting: Surgery

## 2011-05-29 ENCOUNTER — Encounter (INDEPENDENT_AMBULATORY_CARE_PROVIDER_SITE_OTHER): Payer: Self-pay | Admitting: General Surgery

## 2011-05-29 ENCOUNTER — Ambulatory Visit (INDEPENDENT_AMBULATORY_CARE_PROVIDER_SITE_OTHER): Payer: Medicare Other | Admitting: Surgery

## 2011-05-29 VITALS — Temp 97.2°F

## 2011-05-29 DIAGNOSIS — C4360 Malignant melanoma of unspecified upper limb, including shoulder: Secondary | ICD-10-CM

## 2011-05-29 NOTE — Progress Notes (Signed)
Patient returns today in the forearm area has an elevated scab. His surgery was done almost 2 months ago on Apr 02, 2011. I think we would leave the scab in place and I want to see him back in 2 months and see how he is doing.

## 2011-06-12 ENCOUNTER — Telehealth: Payer: Self-pay | Admitting: Nurse Practitioner

## 2011-06-12 NOTE — Telephone Encounter (Signed)
PT NEEDS TO SW SOMEONE ABOUT ONE OF HIS MEDS THAT IS ON AUTO REFILL WITH MAIL ORDER. HE HAS AN ABUNDANCE AND WANTS THEM TO STOP. HE IS UNSURE OF HOW TO HANDLE THIS.

## 2011-06-12 NOTE — Telephone Encounter (Signed)
Called wanting to know how to go about letting [facility] know that he doesn't need any further refills on Metoprolol tart.  When he saw Dr. Deborah Chalk in May he decreased dose to 25 mg daily. States he has enough for a year. Advised him to call [facility] and advise them not to send further refills until he called them. Advised Dr. Excell Seltzer would be doing refills after he is seen in November.

## 2011-06-16 ENCOUNTER — Encounter: Payer: Medicare Other | Admitting: *Deleted

## 2011-06-17 ENCOUNTER — Ambulatory Visit (INDEPENDENT_AMBULATORY_CARE_PROVIDER_SITE_OTHER): Payer: Medicare Other | Admitting: *Deleted

## 2011-06-17 DIAGNOSIS — I4891 Unspecified atrial fibrillation: Secondary | ICD-10-CM

## 2011-06-17 LAB — POCT INR: INR: 2.9

## 2011-07-14 ENCOUNTER — Telehealth: Payer: Self-pay

## 2011-07-14 ENCOUNTER — Telehealth: Payer: Self-pay | Admitting: *Deleted

## 2011-07-14 MED ORDER — ZOSTER VACCINE LIVE 19400 UNT/0.65ML ~~LOC~~ SOLR: 0.6500 mL | Freq: Once | SUBCUTANEOUS | Status: DC

## 2011-07-14 NOTE — Telephone Encounter (Signed)
Patient called to request rx for shingles to be sent to [facility] on [address] for him and his wife(Elizabeth Spainhower). Patient mentioned he is not for certain if he had chicken pox or not.  I spoke with Dr.Hopper and shared all above information and he ok'd patient getting shingles vaccine

## 2011-07-15 ENCOUNTER — Ambulatory Visit (INDEPENDENT_AMBULATORY_CARE_PROVIDER_SITE_OTHER): Payer: Medicare Other | Admitting: *Deleted

## 2011-07-15 DIAGNOSIS — I4891 Unspecified atrial fibrillation: Secondary | ICD-10-CM

## 2011-07-15 LAB — POCT INR: INR: 2.2

## 2011-08-13 ENCOUNTER — Ambulatory Visit (INDEPENDENT_AMBULATORY_CARE_PROVIDER_SITE_OTHER): Payer: Medicare Other | Admitting: *Deleted

## 2011-08-13 DIAGNOSIS — I4891 Unspecified atrial fibrillation: Secondary | ICD-10-CM

## 2011-09-01 ENCOUNTER — Telehealth: Payer: Self-pay

## 2011-09-01 NOTE — Telephone Encounter (Signed)
The pt came into the office and wanted to know if he could take Cod liver oil.  This supplement comes with and without Vitamin A and Vitamin D.  I made the pt aware that the FDA does not research these supplements and it is difficult to know how this supplement will interfere with his current prescription drugs (pt takes Coumadin). The pt is scheduled to see Dr Excell Seltzer on 09/23/11 for the first time and we can discuss his medications at that time.

## 2011-09-08 ENCOUNTER — Other Ambulatory Visit: Payer: Self-pay | Admitting: Nurse Practitioner

## 2011-09-10 ENCOUNTER — Encounter: Payer: Medicare Other | Admitting: *Deleted

## 2011-09-10 ENCOUNTER — Ambulatory Visit (INDEPENDENT_AMBULATORY_CARE_PROVIDER_SITE_OTHER): Payer: Medicare Other | Admitting: *Deleted

## 2011-09-10 DIAGNOSIS — I4891 Unspecified atrial fibrillation: Secondary | ICD-10-CM

## 2011-09-10 LAB — POCT INR: INR: 2.4

## 2011-09-23 ENCOUNTER — Encounter: Payer: Self-pay | Admitting: Cardiovascular Disease

## 2011-09-23 ENCOUNTER — Ambulatory Visit (INDEPENDENT_AMBULATORY_CARE_PROVIDER_SITE_OTHER): Payer: Medicare Other | Admitting: Cardiovascular Disease

## 2011-09-23 DIAGNOSIS — I4891 Unspecified atrial fibrillation: Secondary | ICD-10-CM

## 2011-09-23 DIAGNOSIS — I10 Essential (primary) hypertension: Secondary | ICD-10-CM

## 2011-09-23 DIAGNOSIS — I2510 Atherosclerotic heart disease of native coronary artery without angina pectoris: Secondary | ICD-10-CM

## 2011-09-23 DIAGNOSIS — E785 Hyperlipidemia, unspecified: Secondary | ICD-10-CM

## 2011-09-23 NOTE — Patient Instructions (Addendum)
Your physician wants you to follow-up in: 1 YEAR. You will receive a reminder letter in the mail two months in advance. If you don't receive a letter, please call our office to schedule the follow-up appointment.  Your physician has recommended you make the following change in your medication: START Losartan 50mg  take one by mouth daily (Pt would like to hold off on starting this medication until he can check his BP at home--prescription has not been sent in at this time)  Your physician recommends that you return for a FASTING LIPID, LIVER and BMP in 2 WEEKS (414.01, 401.1)--nothing to eat or drink after midnight  Your physician recommends that you return in 2 WEEKS for a Nurse Visit (BP check)    Your physician has requested that you regularly monitor and record your blood pressure readings at home. Please use the same machine at the same time of day to check your readings and record them to bring to your follow-up visit.

## 2011-09-30 ENCOUNTER — Encounter: Payer: Self-pay | Admitting: Cardiovascular Disease

## 2011-09-30 NOTE — Progress Notes (Signed)
HPI:  77 year-old gentleman presenting for follow-up evaluation. He has been followed by Dr Deborah Chalk for many years. 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 He underwent stenting of the LAD in 2000. He has had no further ischemic events. He is anticoagulated for paroxysmal atrial fibrillation. The patient exercises 3 days per week and has no exertional symptoms. He denies chest pain, dyspnea, edema, orthopnea, or PND.  Outpatient Encounter Prescriptions as of 09/23/2011  Medication Sig Dispense Refill  . atorvastatin (LIPITOR) 10 MG tablet Take 10 mg by mouth daily.        Marland Kitchen ibuprofen (ADVIL,MOTRIN) 200 MG tablet Take 200 mg by mouth as needed. occasionally      . metoprolol tartrate (LOPRESSOR) 25 MG tablet Take 25 mg by mouth daily.        Marland Kitchen warfarin (COUMADIN) 5 MG tablet Take 1 tablet (5 mg total) by mouth daily.  90 tablet  3  . DISCONTD: zoster vaccine live, PF, (ZOSTAVAX) 16109 UNT/0.65ML injection Inject 19,400 Units into the skin once.  1 vial  0    Allergies  Allergen Reactions  . Penicillins     Past Medical History  Diagnosis Date  . Coronary artery disease     MI, atherectomy 1993  . Hyperlipidemia   . Arrhythmia     1995  . Atrial fibrillation   . History of transient ischemic attack (TIA) 2005    double vision  . Hypertension   . History of gallstones   . 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 Eczema     ROS: Negative except as per HPI  BP 148/90  Pulse 74  Ht 6\' 1"  (1.854 m)  Wt 93.495 kg (206 lb 1.9 oz)  BMI 27.19 kg/m2  PHYSICAL EXAM: Pt is alert and oriented, elderly male in NAD HEENT: normal Neck: JVP - normal, carotids 2+= without bruits Lungs: CTA bilaterally CV: RRR with a short grade 2/6 systolic murmur at the RUSB. Abd: soft, NT, Positive BS, no hepatomegaly Ext: no C/C/E, distal pulses intact and equal Skin: warm/dry no rash  EKG:  Atrial fibrillation 74 bpm, otherwise within normal limits.  ASSESSMENT AND  PLAN:

## 2011-09-30 NOTE — Assessment & Plan Note (Signed)
BP elevated. I recommended the addition of losartan today, but the patient declined. He states that his stress level is very high because of today's Presidential election. He wants to monitor BP at home and will let us know if BP range is greater than 140/90.

## 2011-09-30 NOTE — Assessment & Plan Note (Signed)
Lipids previously at goal on Atorvastatin. Has been greater than one year since last check will repeat. LDL was last 75 mg/dL.

## 2011-09-30 NOTE — Assessment & Plan Note (Signed)
Doing well with strategy of rate-control and anticoagulation.

## 2011-09-30 NOTE — Assessment & Plan Note (Signed)
The patient is stable without angina. He is not on ASA because of long-term warfarin. He is tolerating a low-dose beta blocker. Continue current Rx.

## 2011-10-07 ENCOUNTER — Other Ambulatory Visit (INDEPENDENT_AMBULATORY_CARE_PROVIDER_SITE_OTHER): Payer: Medicare Other | Admitting: *Deleted

## 2011-10-07 ENCOUNTER — Ambulatory Visit (INDEPENDENT_AMBULATORY_CARE_PROVIDER_SITE_OTHER): Payer: Medicare Other

## 2011-10-07 ENCOUNTER — Other Ambulatory Visit: Payer: Self-pay | Admitting: *Deleted

## 2011-10-07 VITALS — BP 164/88 | HR 64 | Resp 18 | Ht 74.0 in | Wt 203.0 lb

## 2011-10-07 DIAGNOSIS — I10 Essential (primary) hypertension: Secondary | ICD-10-CM

## 2011-10-07 DIAGNOSIS — I2510 Atherosclerotic heart disease of native coronary artery without angina pectoris: Secondary | ICD-10-CM

## 2011-10-07 LAB — HEPATIC FUNCTION PANEL
ALT: 23 U/L (ref 0–53)
Alkaline Phosphatase: 76 U/L (ref 39–117)
Bilirubin, Direct: 0.2 mg/dL (ref 0.0–0.3)
Total Protein: 6.5 g/dL (ref 6.0–8.3)

## 2011-10-07 LAB — BASIC METABOLIC PANEL
BUN: 27 mg/dL — ABNORMAL HIGH (ref 6–23)
Calcium: 9 mg/dL (ref 8.4–10.5)
Creatinine, Ser: 1.5 mg/dL (ref 0.4–1.5)
GFR: 48.85 mL/min — ABNORMAL LOW (ref >60.00)

## 2011-10-07 LAB — LIPID PANEL
Cholesterol: 142 mg/dL (ref 0–200)
VLDL: 15 mg/dL (ref 0.0–40.0)

## 2011-10-07 MED ORDER — LOSARTAN POTASSIUM 50 MG PO TABS: 50.0000 mg | ORAL_TABLET | Freq: Every day | ORAL | Status: DC

## 2011-10-07 NOTE — Progress Notes (Signed)
Patient ID: Arthur [name], male   DOB: 1925/07/09, 77 y.o.   MRN: [id number] Agree with plan as outlined by Ms. Manson Passey.

## 2011-10-07 NOTE — Telephone Encounter (Signed)
Script kept switching to print, med cancelled and reordered.

## 2011-10-07 NOTE — Patient Instructions (Addendum)
In for nurse visit, BP eval. Home BP 161/84 p 67 are the average readings. I recorded bp and spoke with Dr Excell Seltzer, pt to start taking losartan 50 mg daily, [facility]. Pt verbalized understanding to record bp daily and call back with results in one week. Told him med can cause hypotension and to watch for s/s ie; dizziness. He is to call with any questions or concerns.

## 2011-10-08 ENCOUNTER — Other Ambulatory Visit: Payer: Self-pay | Admitting: *Deleted

## 2011-10-08 ENCOUNTER — Telehealth: Payer: Self-pay | Admitting: Cardiovascular Disease

## 2011-10-08 DIAGNOSIS — I10 Essential (primary) hypertension: Secondary | ICD-10-CM

## 2011-10-08 NOTE — Telephone Encounter (Signed)
Fu call  °Pt returning your call about test results °

## 2011-10-08 NOTE — Telephone Encounter (Signed)
FU Call: Pt returning call back to our office. Please return pt call.

## 2011-10-08 NOTE — Telephone Encounter (Signed)
Spoke with pt, aware of labs and need for repeat labs on 10/22/11 Arthur [name]

## 2011-10-15 ENCOUNTER — Encounter (INDEPENDENT_AMBULATORY_CARE_PROVIDER_SITE_OTHER): Payer: Self-pay | Admitting: Surgery

## 2011-10-15 ENCOUNTER — Ambulatory Visit (INDEPENDENT_AMBULATORY_CARE_PROVIDER_SITE_OTHER): Payer: Medicare Other | Admitting: Surgery

## 2011-10-15 VITALS — BP 144/82 | HR 42 | Temp 97.2°F | Resp 18 | Ht 74.0 in | Wt 206.0 lb

## 2011-10-15 DIAGNOSIS — C439 Malignant melanoma of skin, unspecified: Secondary | ICD-10-CM

## 2011-10-15 NOTE — Patient Instructions (Addendum)
followup with dermatologist

## 2011-10-15 NOTE — Progress Notes (Signed)
Mr. Biehn returns today in followupaafter I excised a melanoma from his left forearm. This is healed nicely. He's got good range of motion and he is playing golf and doing well. His surgery is done June 2012. He had a left axilla sentinel lymph node mapping and biopsy which was negative. Reexcision of melanoma left forearm with full thickness skin graft of left forearm. Impression all healed and doing well. Return p.r.n.

## 2011-10-22 ENCOUNTER — Other Ambulatory Visit (INDEPENDENT_AMBULATORY_CARE_PROVIDER_SITE_OTHER): Payer: Medicare Other | Admitting: *Deleted

## 2011-10-22 ENCOUNTER — Encounter: Payer: Medicare Other | Admitting: *Deleted

## 2011-10-22 ENCOUNTER — Ambulatory Visit (INDEPENDENT_AMBULATORY_CARE_PROVIDER_SITE_OTHER): Payer: Medicare Other | Admitting: *Deleted

## 2011-10-22 DIAGNOSIS — I10 Essential (primary) hypertension: Secondary | ICD-10-CM

## 2011-10-22 DIAGNOSIS — I4891 Unspecified atrial fibrillation: Secondary | ICD-10-CM

## 2011-10-22 LAB — BASIC METABOLIC PANEL
Calcium: 9 mg/dL (ref 8.4–10.5)
GFR: 51.26 mL/min — ABNORMAL LOW (ref >60.00)
Sodium: 140 mEq/L (ref 135–145)

## 2011-10-24 ENCOUNTER — Telehealth: Payer: Self-pay | Admitting: Cardiovascular Disease

## 2011-10-24 ENCOUNTER — Other Ambulatory Visit: Payer: Self-pay | Admitting: Cardiovascular Disease

## 2011-10-24 MED ORDER — LOSARTAN POTASSIUM 100 MG PO TABS: 100.0000 mg | ORAL_TABLET | Freq: Every day | ORAL | Status: DC

## 2011-10-24 NOTE — Telephone Encounter (Signed)
Notified pt of lab results and increased pt to increase Losartan to 100mg  po daily per Dr. Excell Seltzer.  Advised pt to continue to monitor his BP daily and report back readings in 1 week.  Waymon Budge, LPN

## 2011-10-24 NOTE — Telephone Encounter (Signed)
Pt rtn call to get results of test

## 2011-11-18 HISTORY — PX: MELANOMA EXCISION: SHX5266

## 2011-12-01 ENCOUNTER — Other Ambulatory Visit: Payer: Self-pay | Admitting: Cardiovascular Disease

## 2011-12-01 MED ORDER — ATORVASTATIN CALCIUM 10 MG PO TABS: 10.0000 mg | ORAL_TABLET | Freq: Every day | ORAL | Status: DC

## 2011-12-03 ENCOUNTER — Encounter: Payer: Medicare Other | Admitting: *Deleted

## 2011-12-03 ENCOUNTER — Ambulatory Visit (INDEPENDENT_AMBULATORY_CARE_PROVIDER_SITE_OTHER): Payer: Medicare Other | Admitting: *Deleted

## 2011-12-03 DIAGNOSIS — I4891 Unspecified atrial fibrillation: Secondary | ICD-10-CM

## 2011-12-03 LAB — POCT INR: INR: 2

## 2012-01-05 ENCOUNTER — Ambulatory Visit (INDEPENDENT_AMBULATORY_CARE_PROVIDER_SITE_OTHER): Payer: Medicare Other | Admitting: Pharmacist

## 2012-01-05 DIAGNOSIS — I4891 Unspecified atrial fibrillation: Secondary | ICD-10-CM

## 2012-01-05 LAB — POCT INR: INR: 3.3

## 2012-01-14 ENCOUNTER — Encounter: Payer: Medicare Other | Admitting: *Deleted

## 2012-01-27 ENCOUNTER — Other Ambulatory Visit: Payer: Self-pay | Admitting: Dermatology

## 2012-02-02 ENCOUNTER — Ambulatory Visit (INDEPENDENT_AMBULATORY_CARE_PROVIDER_SITE_OTHER): Payer: Medicare Other | Admitting: Pharmacist

## 2012-02-02 DIAGNOSIS — I4891 Unspecified atrial fibrillation: Secondary | ICD-10-CM

## 2012-03-01 ENCOUNTER — Ambulatory Visit (INDEPENDENT_AMBULATORY_CARE_PROVIDER_SITE_OTHER): Payer: Medicare Other | Admitting: *Deleted

## 2012-03-01 DIAGNOSIS — I4891 Unspecified atrial fibrillation: Secondary | ICD-10-CM

## 2012-03-02 NOTE — Telephone Encounter (Signed)
ENCOUNTER COMPLETED 

## 2012-03-09 ENCOUNTER — Other Ambulatory Visit: Payer: Self-pay | Admitting: Cardiology

## 2012-03-29 ENCOUNTER — Ambulatory Visit (INDEPENDENT_AMBULATORY_CARE_PROVIDER_SITE_OTHER): Payer: Medicare Other | Admitting: *Deleted

## 2012-03-29 DIAGNOSIS — I4891 Unspecified atrial fibrillation: Secondary | ICD-10-CM

## 2012-04-19 ENCOUNTER — Ambulatory Visit (INDEPENDENT_AMBULATORY_CARE_PROVIDER_SITE_OTHER): Payer: Medicare Other | Admitting: Pharmacist

## 2012-04-19 DIAGNOSIS — I4891 Unspecified atrial fibrillation: Secondary | ICD-10-CM

## 2012-05-17 ENCOUNTER — Ambulatory Visit (INDEPENDENT_AMBULATORY_CARE_PROVIDER_SITE_OTHER): Payer: Medicare Other | Admitting: Pharmacist

## 2012-05-17 DIAGNOSIS — I4891 Unspecified atrial fibrillation: Secondary | ICD-10-CM

## 2012-05-17 LAB — POCT INR: INR: 2.1

## 2012-06-14 ENCOUNTER — Ambulatory Visit (INDEPENDENT_AMBULATORY_CARE_PROVIDER_SITE_OTHER): Payer: Medicare Other

## 2012-06-14 DIAGNOSIS — I4891 Unspecified atrial fibrillation: Secondary | ICD-10-CM

## 2012-07-06 ENCOUNTER — Encounter (HOSPITAL_COMMUNITY): Payer: Self-pay | Admitting: *Deleted

## 2012-07-06 ENCOUNTER — Emergency Department (HOSPITAL_COMMUNITY)
Admission: EM | Admit: 2012-07-06 | Discharge: 2012-07-06 | Disposition: A | Discharge status: Home / Self Care | Payer: Medicare Other | Attending: Emergency Medicine | Admitting: Emergency Medicine

## 2012-07-06 DIAGNOSIS — I2510 Atherosclerotic heart disease of native coronary artery without angina pectoris: Secondary | ICD-10-CM | POA: Insufficient documentation

## 2012-07-06 DIAGNOSIS — Z8673 Personal history of transient ischemic attack (TIA), and cerebral infarction without residual deficits: Secondary | ICD-10-CM | POA: Insufficient documentation

## 2012-07-06 DIAGNOSIS — I4891 Unspecified atrial fibrillation: Secondary | ICD-10-CM | POA: Insufficient documentation

## 2012-07-06 DIAGNOSIS — Z9861 Coronary angioplasty status: Secondary | ICD-10-CM | POA: Insufficient documentation

## 2012-07-06 DIAGNOSIS — H1132 Conjunctival hemorrhage, left eye: Secondary | ICD-10-CM

## 2012-07-06 DIAGNOSIS — Z7901 Long term (current) use of anticoagulants: Secondary | ICD-10-CM | POA: Insufficient documentation

## 2012-07-06 DIAGNOSIS — Z87891 Personal history of nicotine dependence: Secondary | ICD-10-CM | POA: Insufficient documentation

## 2012-07-06 DIAGNOSIS — I252 Old myocardial infarction: Secondary | ICD-10-CM | POA: Insufficient documentation

## 2012-07-06 DIAGNOSIS — Z880 Allergy status to penicillin: Secondary | ICD-10-CM | POA: Insufficient documentation

## 2012-07-06 DIAGNOSIS — E785 Hyperlipidemia, unspecified: Secondary | ICD-10-CM | POA: Insufficient documentation

## 2012-07-06 DIAGNOSIS — H1130 Conjunctival hemorrhage, unspecified eye: Secondary | ICD-10-CM | POA: Insufficient documentation

## 2012-07-06 DIAGNOSIS — Z951 Presence of aortocoronary bypass graft: Secondary | ICD-10-CM | POA: Insufficient documentation

## 2012-07-06 DIAGNOSIS — I10 Essential (primary) hypertension: Secondary | ICD-10-CM | POA: Insufficient documentation

## 2012-07-06 LAB — CBC
HCT: 45.8 % (ref 39.0–52.0)
MCH: 32.2 pg (ref 26.0–34.0)
MCV: 96.4 fL (ref 78.0–100.0)
Platelets: 137 10*3/uL — ABNORMAL LOW (ref 150–400)
RBC: 4.75 MIL/uL (ref 4.22–5.81)
WBC: 6.4 10*3/uL (ref 4.0–10.5)

## 2012-07-06 MED ORDER — POLYVINYL ALCOHOL 1.4 % OP SOLN
1.0000 [drp] | OPHTHALMIC | Status: DC | PRN
  Administered 2012-07-06: 1 [drp] via OPHTHALMIC
  Filled 2012-07-06: qty 15

## 2012-07-06 NOTE — ED Notes (Addendum)
Pt c/o "popped blood vessel" in left eye at 3 pm this afternoon.  Bleeding controlled.  Bruising around left eye.  Denies CP, SOB, n/v.  Denies any vision changes/loss

## 2012-07-06 NOTE — ED Provider Notes (Signed)
History     CSN: [id number]  Arrival date & time 07/06/12  1924   First MD Initiated Contact with Patient 07/06/12 2001      Chief Complaint  Patient presents with  . Eye Problem    (Consider location/radiation/quality/duration/timing/severity/associated sxs/prior treatment) HPI Comments: Patient states he has a history of spontaneous some conjunctival hemorrhages.  Today.  He noticed a left is worse than normal, and actually had bleeding down onto his cheek.  He has significant bruising to the both the upper and lower lids at this time.  He does not have any visual compromise.  Does not appear to have a hyphema.  Is not having any pain.  Patient states his last INR was checked, approximately 2 weeks, ago, and it was 2.6, and his goal is between 2.5, and 3 for treatment of his atrial fibrillation  Patient is a 78 y.o. male presenting with eye problem. The history is provided by the patient.  Eye Problem  This is a new problem. The current episode started 1 to 2 hours ago. The problem occurs constantly. The problem has not changed since onset.There is pain in the left eye. There was no injury mechanism. Associated symptoms include eye redness. Pertinent negatives include no photophobia and no weakness.    Past Medical History  Diagnosis Date  . Coronary artery disease     MI, atherectomy 1993  . Hyperlipidemia   . Arrhythmia     1995  . Atrial fibrillation   . History of transient ischemic attack (TIA) 2005    double vision  . Hypertension   . History of gallstones   . 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 Eczema     Past Surgical History  Procedure Date  . Cholecystectomy   . Cardiac catheterization 04/08/99    LAD 70% INTER 40-50% RCA 30-40%  . Appendectomy   . Coronary angioplasty 04/08/99    LAD  . Coronary angioplasty with stent placement 04/08/99    LAD  . Coronary angioplasty 10/17/97    RCA     Family History  Problem Relation Age of Onset  . Heart  attack Mother 63    MI  . Pneumonia Father     History  Substance Use Topics  . Smoking status: Former Smoker    Types: Cigarettes, Cigars    Quit date: 11/18/1967  . Smokeless tobacco: Never Used   Comment: 1-2 cigars a day  . Alcohol Use: Yes     2 glasses of wine daily      Review of Systems  Constitutional: Negative for fever and chills.  HENT: Negative for congestion, rhinorrhea and neck pain.   Eyes: Positive for redness. Negative for photophobia, pain and visual disturbance.  Skin: Negative for rash and wound.  Neurological: Negative for dizziness, weakness and headaches.    Allergies  Penicillins  Home Medications   Current Outpatient Rx  Name Route Sig Dispense Refill  . ATORVASTATIN CALCIUM 10 MG PO TABS Oral Take 1 tablet (10 mg total) by mouth daily. 90 tablet 3  . IBUPROFEN 200 MG PO TABS Oral Take 200 mg by mouth every 8 (eight) hours as needed. For pain    . LOSARTAN POTASSIUM 100 MG PO TABS Oral Take 1 tablet (100 mg total) by mouth daily. 90 tablet 3  . METOPROLOL TARTRATE 25 MG PO TABS Oral Take 25 mg by mouth daily.      . WARFARIN SODIUM 5 MG PO TABS Oral  Take 5 mg by mouth daily.      BP 152/85  Pulse 63  Temp 98 F (36.7 C) (Oral)  Resp 18  SpO2 96%  Physical Exam  Constitutional: He is oriented to person, place, and time. He appears well-developed.  HENT:  Head: Normocephalic and atraumatic.  Eyes: EOM are normal. Pupils are equal, round, and reactive to light. Left conjunctiva has a hemorrhage.    Cardiovascular: Normal rate.   Pulmonary/Chest: Effort normal.  Neurological: He is alert and oriented to person, place, and time.  Skin: No rash noted.    ED Course  Procedures (including critical care time)  Labs Reviewed  PROTIME-INR - Abnormal; Notable for the following:    Prothrombin Time 25.9 (*)     INR 2.32 (*)     All other components within normal limits  CBC - Abnormal; Notable for the following:    Platelets 137 (*)       All other components within normal limits   No results found.   1. Conjunctival hemorrhage of left eye       MDM   Will obtain CBC, and INR.  I discussed this with Dr. Silverio Lay.  He will come and consult on the patient.  Prior to my contacting, ophthalmology Patient's INR was checked, is 2.32, there is no need to stop his Coumadin at this time.  I spoke with Dr. suggest a lubricating eyedrop and followup with the office in one to 2 days, earlier, if he has any visual disturbance.  I am to  Cautioned the  patient not to rub his eye        Arman Filter, NP 07/06/12 2142

## 2012-07-07 NOTE — ED Provider Notes (Signed)
Medical screening examination/treatment/procedure(s) were conducted as a shared visit with non-physician practitioner(s) and myself.  I personally evaluated the patient during the encounter  Arthur [name] is a 78 y.o. male hx of afib on coumadin, HTN, TIA here with L subjunctival hemorrhage. Noticed L eye was red suddenly. Denies trauma or any exacerbating factors. No blurry vision or pain.   Vitals stable. Vision nl bilaterally as per NP. He has subconjunctival hemorrhage of L eye without any hyphema. Labs showed stable CBC, INR 2.3. NP called ophthalmologist, who recommended continuing coumadin and using lubricating eyedrops and f/u in 1-2 days. Return if he has blurry vision and caused not to rub eyes.    Richardean Canal, MD 07/07/12 [phone]

## 2012-07-13 ENCOUNTER — Ambulatory Visit (INDEPENDENT_AMBULATORY_CARE_PROVIDER_SITE_OTHER): Payer: Medicare Other

## 2012-07-13 ENCOUNTER — Telehealth: Payer: Self-pay | Admitting: Cardiovascular Disease

## 2012-07-13 DIAGNOSIS — I4891 Unspecified atrial fibrillation: Secondary | ICD-10-CM

## 2012-07-13 NOTE — Telephone Encounter (Signed)
Pt reports subconjunctival hemorrage, no interfering with vision saw optometrist.  Made appt to check INR today.

## 2012-07-13 NOTE — Telephone Encounter (Signed)
I will forward this message to the coumadin clinic to determine if the pt needs to come in for a repeat INR. Pt was seen in the ED 07/06/12 for this issue.

## 2012-07-13 NOTE — Telephone Encounter (Signed)
New Problem:   Patient called in wanting to let you know that he had been hemorrhaging in his right eye for the past three days and his optometrist believes that  It is due to him being on warfarin (COUMADIN) 5 MG tablet.  Please call back.  Patient would like to be called soon so he could be seen if he needs to be seen.

## 2012-08-03 ENCOUNTER — Ambulatory Visit (INDEPENDENT_AMBULATORY_CARE_PROVIDER_SITE_OTHER): Payer: Medicare Other | Admitting: Pharmacist

## 2012-08-03 DIAGNOSIS — I4891 Unspecified atrial fibrillation: Secondary | ICD-10-CM

## 2012-08-20 ENCOUNTER — Telehealth: Payer: Self-pay | Admitting: Cardiovascular Disease

## 2012-08-20 NOTE — Telephone Encounter (Signed)
plz return call to patient [phone] regarding questions about coumadin medication

## 2012-08-20 NOTE — Telephone Encounter (Signed)
Spoke with pt.  He has hurt his back and his orthopedic MD wants to start him on prednisone taper.  Pt wants to know if this okay.  Explained this may increase his INR.  He is going to start it on 10/6.  We made an appt to recheck INR on 10/10

## 2012-08-26 ENCOUNTER — Ambulatory Visit (INDEPENDENT_AMBULATORY_CARE_PROVIDER_SITE_OTHER): Payer: Medicare Other | Admitting: *Deleted

## 2012-08-26 DIAGNOSIS — I4891 Unspecified atrial fibrillation: Secondary | ICD-10-CM

## 2012-08-26 LAB — POCT INR: INR: 3.9

## 2012-09-16 ENCOUNTER — Telehealth: Payer: Self-pay | Admitting: Cardiovascular Disease

## 2012-09-16 ENCOUNTER — Ambulatory Visit (INDEPENDENT_AMBULATORY_CARE_PROVIDER_SITE_OTHER): Payer: Medicare Other | Admitting: Pharmacist

## 2012-09-16 ENCOUNTER — Telehealth: Payer: Self-pay | Admitting: *Deleted

## 2012-09-16 DIAGNOSIS — I4891 Unspecified atrial fibrillation: Secondary | ICD-10-CM

## 2012-09-16 LAB — POCT INR: INR: 3.1

## 2012-09-16 MED ORDER — WARFARIN SODIUM 5 MG PO TABS: 5.0000 mg | ORAL_TABLET | ORAL | Status: DC

## 2012-09-16 MED ORDER — ATORVASTATIN CALCIUM 10 MG PO TABS: 10.0000 mg | ORAL_TABLET | Freq: Every day | ORAL | Status: DC

## 2012-09-16 MED ORDER — LOSARTAN POTASSIUM 100 MG PO TABS: 100.0000 mg | ORAL_TABLET | Freq: Every day | ORAL | Status: DC

## 2012-09-16 NOTE — Telephone Encounter (Signed)
Pt needs a 1 week supply of his Lipitor sent to local pharmacy [facility] on [address].

## 2012-09-16 NOTE — Telephone Encounter (Signed)
New Problem:   Coumadin Clinic:  Patient called in needing a refill of his warfarin (COUMADIN) 5 MG tablet. Please call once the order has been placed.  Refill:  Also needs a refill of his atorvastatin (LIPITOR) 10 MG tablet and losartan (COZAAR) 100 MG tablet.  Please call once the order has been placed.

## 2012-09-17 MED ORDER — ATORVASTATIN CALCIUM 10 MG PO TABS: 10.0000 mg | ORAL_TABLET | Freq: Every day | ORAL | Status: DC

## 2012-10-05 ENCOUNTER — Ambulatory Visit (INDEPENDENT_AMBULATORY_CARE_PROVIDER_SITE_OTHER): Payer: Medicare Other | Admitting: Pharmacist

## 2012-10-05 DIAGNOSIS — I4891 Unspecified atrial fibrillation: Secondary | ICD-10-CM

## 2012-10-05 LAB — POCT INR: INR: 1.8

## 2012-10-08 ENCOUNTER — Ambulatory Visit (INDEPENDENT_AMBULATORY_CARE_PROVIDER_SITE_OTHER): Payer: Medicare Other | Admitting: Cardiovascular Disease

## 2012-10-08 ENCOUNTER — Encounter: Payer: Self-pay | Admitting: Cardiovascular Disease

## 2012-10-08 VITALS — BP 153/79 | HR 63 | Ht 74.0 in | Wt 198.0 lb

## 2012-10-08 DIAGNOSIS — I10 Essential (primary) hypertension: Secondary | ICD-10-CM

## 2012-10-08 DIAGNOSIS — I2510 Atherosclerotic heart disease of native coronary artery without angina pectoris: Secondary | ICD-10-CM

## 2012-10-08 LAB — LIPID PANEL
Cholesterol: 111 mg/dL (ref 0–200)
HDL: 41.2 mg/dL (ref >39.00)
Total CHOL/HDL Ratio: 3
Triglycerides: 48 mg/dL (ref 0.0–149.0)

## 2012-10-08 LAB — HEPATIC FUNCTION PANEL
ALT: 72 U/L — ABNORMAL HIGH (ref 0–53)
AST: 54 U/L — ABNORMAL HIGH (ref 0–37)
Albumin: 3.3 g/dL — ABNORMAL LOW (ref 3.5–5.2)
Alkaline Phosphatase: 85 U/L (ref 39–117)
Total Protein: 7.2 g/dL (ref 6.0–8.3)

## 2012-10-08 LAB — BASIC METABOLIC PANEL
CO2: 31 mEq/L (ref 19–32)
Calcium: 9.1 mg/dL (ref 8.4–10.5)
Creatinine, Ser: 1.4 mg/dL (ref 0.4–1.5)
GFR: 50.73 mL/min — ABNORMAL LOW (ref >60.00)
Sodium: 141 mEq/L (ref 135–145)

## 2012-10-08 NOTE — Patient Instructions (Addendum)
Your physician has requested that you regularly monitor and record your blood pressure readings at home. Please use the same machine at the same time of day to check your readings and record them to bring to your follow-up visit. Please check your BP 2-3 times per week and call the office in 2 WEEKS with your BP readings.  We will make medication adjustments at that time.   Your physician wants you to follow-up in: 1 YEAR with Dr Excell Seltzer.  You will receive a reminder letter in the mail two months in advance. If you don't receive a letter, please call our office to schedule the follow-up appointment.  Your physician recommends that you have lab work today: LIPID, LIVER and BMP

## 2012-10-08 NOTE — Progress Notes (Signed)
   HPI:  78 year old woman presenting for followup evaluation. The patient has coronary artery disease with a history of septal infarction and inferior infarction in 1990s. He underwent stenting of the LAD in 2000. The patient is anticoagulated for atrial fibrillation.  The patient is doing well from a cardiac standpoint. He denies chest pain, chest pressure, dyspnea, or palpitations. He's had problems with low back pain and Advil helps him quite a bit. However, his INRs have been more erratic. He has tried Tylenol but this is not helping him at all. He's also been doing physical therapy.  Outpatient Encounter Prescriptions as of 10/08/2012  Medication Sig Dispense Refill  . atorvastatin (LIPITOR) 10 MG tablet Take 1 tablet (10 mg total) by mouth daily.  10 tablet  1  . losartan (COZAAR) 100 MG tablet Take 1 tablet (100 mg total) by mouth daily.  90 tablet  3  . metoprolol tartrate (LOPRESSOR) 25 MG tablet Take 12.5 mg by mouth daily.       Marland Kitchen warfarin (COUMADIN) 5 MG tablet Take by mouth as directed.      . [DISCONTINUED] warfarin (COUMADIN) 5 MG tablet Take 1 tablet (5 mg total) by mouth as directed.  90 tablet  1  . ibuprofen (ADVIL,MOTRIN) 200 MG tablet Take 200 mg by mouth every 8 (eight) hours as needed. For pain        Allergies  Allergen Reactions  . Penicillins Hives    Past Medical History  Diagnosis Date  . Coronary artery disease     MI, atherectomy 1993  . Hyperlipidemia   . Arrhythmia     1995  . Atrial fibrillation   . History of transient ischemic attack (TIA) 2005    double vision  . Hypertension   . History of gallstones   . 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 Eczema     ROS: Negative except as per HPI  BP 153/79  Pulse 63  Ht 6\' 2"  (1.88 m)  Wt 89.812 kg (198 lb)  BMI 25.42 kg/m2  PHYSICAL EXAM: Pt is alert and oriented, NAD HEENT: normal Neck: JVP - normal, carotids 2+= without bruits Lungs: CTA bilaterally CV: Irregularly irregular without  murmur or gallop Abd: soft, NT, Positive BS, no hepatomegaly Ext: no C/C/E, distal pulses intact and equal Skin: warm/dry no rash  EKG:  Atrial fibrillation 63 beats per minute, cannot rule out anterior infarct age undetermined.  ASSESSMENT AND PLAN: 1. Coronary artery disease, native vessel. He remains asymptomatic. He is anticoagulated with warfarin so he has not prescribed aspirin. He also is on metoprolol 12.5 mg daily.  2. Chronic atrial fibrillation. Continue anticoagulation. His heart rate is well controlled.  3. Hypertension. Blood pressure remains elevated. I've asked him to obtain a blood pressure cuff and call us in a few weeks with readings. He's on losartan metoprolol and may need a diuretic added to his regimen.  4. Hyperlipidemia. The patient is on low-dose Lipitor. His lipids from last year were reviewed. Will followup with lipids and LFTs.  Tonny Bollman 10/08/2012 11:25 AM

## 2012-10-11 ENCOUNTER — Other Ambulatory Visit: Payer: Self-pay

## 2012-10-11 DIAGNOSIS — R748 Abnormal levels of other serum enzymes: Secondary | ICD-10-CM

## 2012-10-19 ENCOUNTER — Ambulatory Visit (INDEPENDENT_AMBULATORY_CARE_PROVIDER_SITE_OTHER): Payer: Medicare Other | Admitting: *Deleted

## 2012-10-19 DIAGNOSIS — I4891 Unspecified atrial fibrillation: Secondary | ICD-10-CM

## 2012-10-28 ENCOUNTER — Ambulatory Visit (INDEPENDENT_AMBULATORY_CARE_PROVIDER_SITE_OTHER): Payer: Medicare Other | Admitting: *Deleted

## 2012-10-28 DIAGNOSIS — I4891 Unspecified atrial fibrillation: Secondary | ICD-10-CM

## 2012-10-28 LAB — POCT INR: INR: 3.2

## 2012-11-16 ENCOUNTER — Ambulatory Visit (INDEPENDENT_AMBULATORY_CARE_PROVIDER_SITE_OTHER): Payer: Medicare Other | Admitting: *Deleted

## 2012-11-16 DIAGNOSIS — I4891 Unspecified atrial fibrillation: Secondary | ICD-10-CM

## 2012-11-16 LAB — POCT INR: INR: 3.3

## 2012-11-30 ENCOUNTER — Telehealth: Payer: Self-pay | Admitting: Cardiovascular Disease

## 2012-11-30 ENCOUNTER — Ambulatory Visit (INDEPENDENT_AMBULATORY_CARE_PROVIDER_SITE_OTHER): Payer: Medicare Other

## 2012-11-30 DIAGNOSIS — I4891 Unspecified atrial fibrillation: Secondary | ICD-10-CM

## 2012-11-30 DIAGNOSIS — E7800 Pure hypercholesterolemia, unspecified: Secondary | ICD-10-CM

## 2012-11-30 NOTE — Telephone Encounter (Signed)
I spoke with the pt and made him aware that he needs to come into the office next week for repeat blood work.  We will check a lipid and liver profile on 12/06/12. The pt has been taking Atorvastatin 10mg  daily.

## 2012-11-30 NOTE — Telephone Encounter (Signed)
New problem:   Has Dr. Excell Seltzer taken any consideration - Lipitor.

## 2012-12-06 ENCOUNTER — Other Ambulatory Visit: Payer: Medicare Other

## 2012-12-09 ENCOUNTER — Other Ambulatory Visit (INDEPENDENT_AMBULATORY_CARE_PROVIDER_SITE_OTHER): Payer: Medicare Other

## 2012-12-09 DIAGNOSIS — R748 Abnormal levels of other serum enzymes: Secondary | ICD-10-CM

## 2012-12-09 DIAGNOSIS — E7800 Pure hypercholesterolemia, unspecified: Secondary | ICD-10-CM

## 2012-12-09 LAB — HEPATIC FUNCTION PANEL
ALT: 276 U/L — ABNORMAL HIGH (ref 0–53)
Albumin: 3.4 g/dL — ABNORMAL LOW (ref 3.5–5.2)
Bilirubin, Direct: 0.2 mg/dL (ref 0.0–0.3)
Total Protein: 7.2 g/dL (ref 6.0–8.3)

## 2012-12-09 LAB — LIPID PANEL
Total CHOL/HDL Ratio: 3
Triglycerides: 62 mg/dL (ref 0.0–149.0)

## 2012-12-10 ENCOUNTER — Telehealth: Payer: Self-pay | Admitting: Cardiovascular Disease

## 2012-12-10 ENCOUNTER — Other Ambulatory Visit: Payer: Self-pay

## 2012-12-10 NOTE — Telephone Encounter (Signed)
Spoke with pt, abdominal ultrasound scheduled for Wednesday 12-15-12. Pt to arrive at [place] @ 9:15am. He will be NPO 6 hours prior.

## 2012-12-10 NOTE — Telephone Encounter (Signed)
Pt rtn call from yesterday

## 2012-12-10 NOTE — Telephone Encounter (Signed)
New problem:   clarification on test results.

## 2012-12-10 NOTE — Telephone Encounter (Signed)
Spoke with pt, elevated liver function discussed with dr cooper. Pt to be scheduled for liver ultrasound.

## 2012-12-15 ENCOUNTER — Ambulatory Visit (HOSPITAL_COMMUNITY)
Admission: RE | Admit: 2012-12-15 | Discharge: 2012-12-15 | Disposition: A | Discharge status: Home / Self Care | Payer: Medicare Other | Source: Ambulatory Visit | Attending: Cardiovascular Disease | Admitting: Cardiovascular Disease

## 2012-12-15 ENCOUNTER — Telehealth: Payer: Self-pay | Admitting: Cardiovascular Disease

## 2012-12-15 DIAGNOSIS — R748 Abnormal levels of other serum enzymes: Secondary | ICD-10-CM

## 2012-12-15 DIAGNOSIS — K8020 Calculus of gallbladder without cholecystitis without obstruction: Secondary | ICD-10-CM | POA: Insufficient documentation

## 2012-12-15 DIAGNOSIS — R7989 Other specified abnormal findings of blood chemistry: Secondary | ICD-10-CM | POA: Insufficient documentation

## 2012-12-15 DIAGNOSIS — K7689 Other specified diseases of liver: Secondary | ICD-10-CM | POA: Insufficient documentation

## 2012-12-15 DIAGNOSIS — N200 Calculus of kidney: Secondary | ICD-10-CM | POA: Insufficient documentation

## 2012-12-15 NOTE — Telephone Encounter (Signed)
Explained to pt that the results were not in the system yet, told him I will forward a msg to dr/nurse and he will receive a call when available, pt agreed to plan.

## 2012-12-15 NOTE — Telephone Encounter (Signed)
New Problem:    Patient called in because he had an acoustic scan today and wanted to know the results.  Please call back.

## 2012-12-16 ENCOUNTER — Ambulatory Visit (INDEPENDENT_AMBULATORY_CARE_PROVIDER_SITE_OTHER): Payer: Medicare Other | Admitting: *Deleted

## 2012-12-16 ENCOUNTER — Encounter: Payer: Self-pay | Admitting: Cardiovascular Disease

## 2012-12-16 DIAGNOSIS — I4891 Unspecified atrial fibrillation: Secondary | ICD-10-CM

## 2012-12-16 LAB — POCT INR: INR: 2

## 2012-12-16 NOTE — Telephone Encounter (Signed)
Pt requesting test results done yesterday done at [facility]

## 2012-12-16 NOTE — Telephone Encounter (Signed)
This encounter was created in error - please disregard.

## 2012-12-16 NOTE — Telephone Encounter (Signed)
Arthur [name] 12/16/2012 4:58 PM Signed  Pt requesting test results done yesterday done at [facility]

## 2012-12-17 NOTE — Telephone Encounter (Signed)
Left message for pt to call back  °

## 2012-12-17 NOTE — Telephone Encounter (Signed)
New Problem     Returning a phone call from Dr. Earmon Phoenix nurse  Call regarding results of scan done on Wednesday

## 2012-12-17 NOTE — Telephone Encounter (Signed)
I spoke with the pt and made him aware of abdominal ultrasound results.  The pt is already aware of gallstones and states he has had them for a while but they are to big to pass. The pt is not using tylenol but does consume 2 glasses of wine every evening (has done for a number of years).  I instructed the pt that he needs to stop using alcohol.  The pt will go to one glass of wine/day for one week and then discontinue use.  The pt is scheduled for repeat liver function on 01/11/13.

## 2012-12-17 NOTE — Telephone Encounter (Signed)
New Problem     Pt had sonic scan earlier this week, calling about results.

## 2012-12-17 NOTE — Telephone Encounter (Signed)
Follow-up:    Patient called in following up on his initial call.  Please call back.

## 2013-01-11 ENCOUNTER — Other Ambulatory Visit (INDEPENDENT_AMBULATORY_CARE_PROVIDER_SITE_OTHER): Payer: Medicare Other

## 2013-01-11 ENCOUNTER — Ambulatory Visit (INDEPENDENT_AMBULATORY_CARE_PROVIDER_SITE_OTHER): Payer: Medicare Other

## 2013-01-11 ENCOUNTER — Telehealth: Payer: Self-pay | Admitting: Internal Medicine

## 2013-01-11 ENCOUNTER — Telehealth: Payer: Self-pay | Admitting: Cardiovascular Disease

## 2013-01-11 DIAGNOSIS — I4891 Unspecified atrial fibrillation: Secondary | ICD-10-CM

## 2013-01-11 DIAGNOSIS — R748 Abnormal levels of other serum enzymes: Secondary | ICD-10-CM

## 2013-01-11 LAB — HEPATIC FUNCTION PANEL
ALT: 494 U/L — ABNORMAL HIGH (ref 0–53)
Alkaline Phosphatase: 154 U/L — ABNORMAL HIGH (ref 39–117)
Bilirubin, Direct: 0.3 mg/dL (ref 0.0–0.3)
Total Bilirubin: 1 mg/dL (ref 0.3–1.2)

## 2013-01-11 NOTE — Telephone Encounter (Signed)
Patient is scheduled to see Dr. Marina Goodell tomorrow at 10:15

## 2013-01-11 NOTE — Telephone Encounter (Signed)
New problem:  Test results.  

## 2013-01-11 NOTE — Telephone Encounter (Signed)
Follow-up:    Patient called in because he was unable to get in contact with [facility]'s office.  Please call back.

## 2013-01-11 NOTE — Telephone Encounter (Signed)
Follow-up:     Patient called in wanting to know the results of his latest labs.  Please call back.

## 2013-01-11 NOTE — Telephone Encounter (Signed)
I spoke with the pt and the virtual colonoscopy that was performed in the past was not ordered by a GI physician.  This test was ordered by a friend of the pt who is a physician. I contacted [facility] and they have scheduled the pt to see Dr Marina Goodell tomorrow morning at 10:15.  I instructed the pt to arrive at 10:00 for appointment. Pt verbalized understanding.

## 2013-01-11 NOTE — Telephone Encounter (Signed)
I spoke with the pt and made him aware of hepatic panel results.  Per Dr Excell Seltzer the pt needs ASAP referral to GI.  I contacted [facility] and they made me aware that before an appointment can be scheduled they need to know if the pt has seen a GI doctor in the past.  I spoke with the pt and he has had a virtual colonoscopy in the past but cannot remember who ordered that test.  The pt will try to find out the name of physician and then call our office.

## 2013-01-12 ENCOUNTER — Encounter: Payer: Self-pay | Admitting: Internal Medicine

## 2013-01-12 ENCOUNTER — Ambulatory Visit (INDEPENDENT_AMBULATORY_CARE_PROVIDER_SITE_OTHER): Payer: Medicare Other | Admitting: Internal Medicine

## 2013-01-12 ENCOUNTER — Other Ambulatory Visit (INDEPENDENT_AMBULATORY_CARE_PROVIDER_SITE_OTHER): Payer: Medicare Other

## 2013-01-12 VITALS — BP 132/78 | HR 79 | Ht 74.0 in | Wt 198.0 lb

## 2013-01-12 LAB — HEPATITIS B SURFACE ANTIGEN: Hepatitis B Surface Ag: NEGATIVE

## 2013-01-12 LAB — HEPATITIS C ANTIBODY: HCV Ab: NEGATIVE

## 2013-01-12 NOTE — Patient Instructions (Addendum)
Your physician has requested that you go to the basement for lab work before leaving today.  We will call you with the results

## 2013-01-12 NOTE — Progress Notes (Signed)
HISTORY OF PRESENT ILLNESS:  Arthur [name] is a 79 y.o. male with coronary artery disease status post myocardial infarction with remote antrectomy, hyperlipidemia, chronic atrial fibrillation on Coumadin, question remote TIA, hypertension, and skin cancer. He is sent today regarding new onset elevated liver tests. The patient has been on 20 mg of Lipitor for years. About 3 months ago he was noted to have mild elevation of transaminases. His Lipitor was decreased from 20 mg daily to 10 mg daily. Followup liver tests last month revealed progressive abnormalities. The medication was discontinued and he was told to abstain from alcohol (drinks about 2 glasses of wine daily) which he has. Followup liver tests yesterday progressed with AST 278, ALT 494, alkaline phosphatase with a 154, total bilirubin 1.0. Total protein 7.1 and albumin 3.2. He is on chronic Coumadin therapy and his INR yesterday was 2.5. The patient denies a personal or family history of liver disease. No exposure to persons with hepatitis. He does take 200 mg of ibuprofen approximately 3 times per week when he plays golf. He denies over-the-counter medications or herbal supplements. No prior transfusion. His GI review of systems is entirely negative as is his non-GI review of systems. He did undergo abdominal ultrasound on 12/15/2012. He was noted to have gallstones (previously known), fatty liver, and question of minimal prominence of intrahepatic biliary ducts as well as slight lobulated contour of the liver. No other abnormalities. The patient did undergo a virtual colonoscopy in November of 2008 for Hemoccult-positive stool. Examination was unremarkable except for incidental gallstones. Liver was normal. No polyps. He is active playing golf and travels back-and-forth to [place] regularly. Though retired, he is involved with 2 businesses currently  REVIEW OF SYSTEMS:  All non-GI ROS negative except for arthritis and arthralgias  Past Medical  History  Diagnosis Date  . Coronary artery disease     MI, atherectomy 1993  . Hyperlipidemia   . Arrhythmia     1995  . Atrial fibrillation   . History of transient ischemic attack (TIA) 2005    double vision  . Hypertension   . History of gallstones   . 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 Eczema   . Elevated LFTs   . Skin cancer     Past Surgical History  Procedure Laterality Date  . Cholecystectomy    . Cardiac catheterization  04/08/99    LAD 70% INTER 40-50% RCA 30-40%  . Appendectomy    . Coronary angioplasty  04/08/99    LAD  . Coronary angioplasty with stent placement  04/08/99    LAD  . Coronary angioplasty  10/17/97    RCA     Social History Arthur [name]  reports that he quit smoking about 45 years ago. His smoking use included Cigarettes and Cigars. He smoked 0.00 packs per day. He has never used smokeless tobacco. He reports that  drinks alcohol. He reports that he does not use illicit drugs.  family history includes Heart attack (age of onset: 47) in his mother and Pneumonia in his father.  Allergies  Allergen Reactions  . Penicillins Hives       PHYSICAL EXAMINATION: Vital signs: BP 132/78  Pulse 79  Ht 6\' 2"  (1.88 m)  Wt 198 lb (89.812 kg)  BMI 25.41 kg/m2  SpO2 98%  Constitutional: generally well-appearing, no acute distress Psychiatric: alert and oriented x3, cooperative Eyes: extraocular movements intact, anicteric, conjunctiva pink Mouth: oral pharynx moist, no lesions Neck: supple no  lymphadenopathy Cardiovascular: Irregular heart  rate and rhythm, soft systolic murmur Lungs: clear to auscultation bilaterally Abdomen: soft, nontender, nondistended, no obvious ascites, no peritoneal signs, normal bowel sounds, no organomegaly Rectal: Omitted Extremities: no lower extremity edema bilaterally Skin: no lesions on visible extremities Neuro: No focal deficits. No asterixis.    ASSESSMENT:  #1. Elevated hepatic transaminases. 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 Rule out other causes such as viral hepatitis. Currently asymptomatic with intact hepatic synthetic function.   PLAN:  #1. Viral and nonviral liver studies today #2. Continue to hold Lipitor and abstain from all alcohol. As well, minimal NSAIDs only #3. We will need to monitor his liver functions regulate. If they continue to deteriorate, he will likely need a liver biopsy. We discussed this. We'll also discuss his issues regarding travel back-and-forth from [place]. He is available to [place] as needed.

## 2013-01-13 LAB — ANTI-SMOOTH MUSCLE/MITOCHOND.: Smooth Muscle Ab: 85 Units — ABNORMAL HIGH (ref 0–19)

## 2013-01-13 LAB — HEPATITIS B SURFACE ANTIBODY,QUALITATIVE: Hep B S Ab: NONREACTIVE

## 2013-01-13 LAB — ANTI-SMOOTH MUSCLE ANTIBODY, IGG: Smooth Muscle Ab: 75 U — ABNORMAL HIGH (ref <20)

## 2013-01-13 LAB — ANA: Anti Nuclear Antibody(ANA): NEGATIVE

## 2013-01-14 ENCOUNTER — Other Ambulatory Visit: Payer: Self-pay | Admitting: Internal Medicine

## 2013-01-17 ENCOUNTER — Telehealth: Payer: Self-pay | Admitting: Internal Medicine

## 2013-01-17 NOTE — Telephone Encounter (Signed)
See result note.  

## 2013-01-20 ENCOUNTER — Telehealth: Payer: Self-pay | Admitting: Internal Medicine

## 2013-01-20 NOTE — Telephone Encounter (Signed)
Pt states that the lab in [place] told him Medicare will not cover his PT/INR, he wanted to know if he really needed to have it done. Explained to pt that Dr. Marina Goodell did want him to have the LFT's and PT/INR repeated. Let him know that we have refaxed the orders again with diagnosis code added per the labs request.

## 2013-01-24 ENCOUNTER — Telehealth: Payer: Self-pay | Admitting: Internal Medicine

## 2013-01-24 NOTE — Telephone Encounter (Signed)
I spoke with patient this evening regarding worsening LFT's. Ideally, I would like a liver bx as the next step. However, as he is in [place], his son just unexpectedly just died (funeral later this week), he's on coumadin, and it's likely that this is autoimmune hepatitis, we will empirically treat with prednisone 40 mg daily and obtain LFT's once weekly (in [place] until he returns to [place] in April). I reviewed steroid side effects. He is to call ASAP for any questions / problems.  Bonita Quin, talk to him in am. Get prednisone to his pharmacy of choice and arrange once weekly LFT's for my review. Thanks. JNP

## 2013-01-24 NOTE — Telephone Encounter (Signed)
Pt had labs drawn in [place] 01/20/13. Results on Dr. Lamar Sprinkles desk for review. Pt calling for results. Dr. Marina Goodell please advise.  PT 14.3 INR 1.38 Total protein 7.7 Albumin 3.0 Total bilirubin 1.0 Direct bilirubin 0.4 Alk Phos 222 AST 432 ALT 681

## 2013-01-25 MED ORDER — PREDNISONE (PAK) 10 MG PO TABS: 40.0000 mg | ORAL_TABLET | Freq: Every day | ORAL | Status: DC

## 2013-01-25 NOTE — Telephone Encounter (Signed)
Attempted to call pt but received message that voicemail has not been set up. Will try pt again later.  Pt aware and script sent to the pharmacy. Orders faxed for weekly labs. Pt wants to go every Monday.

## 2013-01-26 ENCOUNTER — Telehealth: Payer: Self-pay | Admitting: Internal Medicine

## 2013-01-27 NOTE — Telephone Encounter (Signed)
Left message for pt to call back  °

## 2013-01-27 NOTE — Telephone Encounter (Signed)
Called pt and Left message for pt that I did not call him yesterday.

## 2013-01-31 ENCOUNTER — Telehealth: Payer: Self-pay | Admitting: Internal Medicine

## 2013-01-31 NOTE — Telephone Encounter (Signed)
Received labs by fax. Labs to Dr. Marina Goodell for review.

## 2013-01-31 NOTE — Telephone Encounter (Signed)
Spoke with pt and let him know that we do not have the lab results back yet.

## 2013-02-01 NOTE — Telephone Encounter (Signed)
Please let patient know that his liver tests are much improved. Continue on prednisone 40 mg daily and repeat lft's in one week. Thanks    LABS 01-31-13 ([facility])  SGOT 108 SGPT 383 ALK P 201 TBILI  0.3 ALB  2.7 PROT 7.3

## 2013-02-01 NOTE — Telephone Encounter (Signed)
Spoke with pt and he is aware. Orders have been faxed to [place] for weekly LFT's.

## 2013-02-03 ENCOUNTER — Telehealth: Payer: Self-pay

## 2013-02-03 ENCOUNTER — Other Ambulatory Visit: Payer: Self-pay | Admitting: Internal Medicine

## 2013-02-03 NOTE — Telephone Encounter (Signed)
Pt called and states that he hit his toe in [place] and he went to the urgent care. Pt was given an antibiotic to take but he is afraid to start taking it. States he was given Doxycycline and he is allergic to PCN. Pt is worried about taking this. Discussed with pt that it is a derivative of tetracycline. Pt states he is going to use the ointment he was given and wait on the antibiotic, states his toe seems to be getting better.

## 2013-02-07 ENCOUNTER — Telehealth: Payer: Self-pay

## 2013-02-07 NOTE — Telephone Encounter (Signed)
Let patient know that LFT's continue to improve. Have him stay on prednisone 40 mg daily and repeat LFT's in one week.

## 2013-02-07 NOTE — Telephone Encounter (Signed)
Pt had labs in [place] today. Pt is currently taking Prednisone 40mg  daily. Pt is scheduled for labs next Monday. Please advise.  Total protein 7.0 Albumin        2.7 Total Bilirubin 0.7 Direct Bilirubin 0.3 Alkaline Phosphatase 171 AST (SGOT) 42 ALT (SGPT) 154

## 2013-02-07 NOTE — Telephone Encounter (Signed)
Left message for pt to call back.  Called pt and left him a message regarding the prednisone and repeat lab in one week.

## 2013-02-08 ENCOUNTER — Other Ambulatory Visit: Payer: Self-pay | Admitting: Internal Medicine

## 2013-02-08 ENCOUNTER — Encounter: Payer: Self-pay | Admitting: Internal Medicine

## 2013-02-14 ENCOUNTER — Other Ambulatory Visit (INDEPENDENT_AMBULATORY_CARE_PROVIDER_SITE_OTHER): Payer: Medicare Other

## 2013-02-14 ENCOUNTER — Encounter: Payer: Self-pay | Admitting: Internal Medicine

## 2013-02-14 DIAGNOSIS — R7989 Other specified abnormal findings of blood chemistry: Secondary | ICD-10-CM

## 2013-02-14 LAB — HEPATIC FUNCTION PANEL
Albumin: 3.6 g/dL (ref 3.5–5.2)
Total Protein: 7.4 g/dL (ref 6.0–8.3)

## 2013-02-15 ENCOUNTER — Other Ambulatory Visit: Payer: Self-pay | Admitting: Internal Medicine

## 2013-02-15 ENCOUNTER — Ambulatory Visit (INDEPENDENT_AMBULATORY_CARE_PROVIDER_SITE_OTHER): Payer: Medicare Other | Admitting: *Deleted

## 2013-02-15 DIAGNOSIS — I4891 Unspecified atrial fibrillation: Secondary | ICD-10-CM

## 2013-02-16 ENCOUNTER — Other Ambulatory Visit: Payer: Self-pay | Admitting: *Deleted

## 2013-02-16 MED ORDER — METOPROLOL TARTRATE 25 MG PO TABS: 25.0000 mg | ORAL_TABLET | Freq: Every day | ORAL | Status: DC

## 2013-02-16 NOTE — Telephone Encounter (Signed)
Fax Received. Refill Completed. Shekinah Pitones Chowoe (R.M.A)   

## 2013-02-21 ENCOUNTER — Other Ambulatory Visit: Payer: Self-pay | Admitting: Internal Medicine

## 2013-02-21 ENCOUNTER — Telehealth: Payer: Self-pay

## 2013-02-21 ENCOUNTER — Other Ambulatory Visit (INDEPENDENT_AMBULATORY_CARE_PROVIDER_SITE_OTHER): Payer: Medicare Other

## 2013-02-21 DIAGNOSIS — Z7901 Long term (current) use of anticoagulants: Secondary | ICD-10-CM

## 2013-02-21 DIAGNOSIS — R7989 Other specified abnormal findings of blood chemistry: Secondary | ICD-10-CM

## 2013-02-21 LAB — HEPATIC FUNCTION PANEL
Albumin: 3 g/dL — ABNORMAL LOW (ref 3.5–5.2)
Alkaline Phosphatase: 120 U/L — ABNORMAL HIGH (ref 39–117)
Total Protein: 5.9 g/dL — ABNORMAL LOW (ref 6.0–8.3)

## 2013-02-21 LAB — PROTIME-INR: Prothrombin Time: 57.5 s (ref 10.2–12.4)

## 2013-02-21 NOTE — Telephone Encounter (Signed)
Arthur [name], please let the patient know that his INR is significantly elevated. He needs to hold his Coumadin and contact his Coumadin clinic immediately for additional instructions. Other laboratories are we are interested in, liver tests are still pending. We'll contact him when those are available. Thanks

## 2013-02-21 NOTE — Telephone Encounter (Signed)
Spoke with patient. Having some prednisone related insomnia. He does not want a hypnotic.Marland Kitchen Answered additional questions. He will contact the Coumadin clinic for further instructions regarding Coumadin and his INR. He'll see me in the office in a few weeks.

## 2013-02-21 NOTE — Telephone Encounter (Signed)
Left message for pt to call back  °

## 2013-02-21 NOTE — Telephone Encounter (Signed)
Critical from lab:  Pt: 57-45  INR: 5.61

## 2013-02-21 NOTE — Telephone Encounter (Signed)
Spoke with pt and he is aware. See result note for further results and instructions. Pt requested to speak with Dr. Marina Goodell , would like for Dr. Marina Goodell to call him. Dr. Marina Goodell notified.

## 2013-02-22 ENCOUNTER — Ambulatory Visit (INDEPENDENT_AMBULATORY_CARE_PROVIDER_SITE_OTHER): Payer: Medicare Other | Admitting: Cardiovascular Disease

## 2013-02-22 DIAGNOSIS — I4891 Unspecified atrial fibrillation: Secondary | ICD-10-CM

## 2013-02-25 ENCOUNTER — Telehealth: Payer: Self-pay | Admitting: Internal Medicine

## 2013-03-01 ENCOUNTER — Ambulatory Visit (INDEPENDENT_AMBULATORY_CARE_PROVIDER_SITE_OTHER): Payer: Medicare Other | Admitting: Pharmacist

## 2013-03-01 DIAGNOSIS — I4891 Unspecified atrial fibrillation: Secondary | ICD-10-CM

## 2013-03-01 LAB — POCT INR: INR: 2.1

## 2013-03-02 ENCOUNTER — Encounter: Payer: Self-pay | Admitting: Internal Medicine

## 2013-03-02 ENCOUNTER — Ambulatory Visit (INDEPENDENT_AMBULATORY_CARE_PROVIDER_SITE_OTHER): Payer: Medicare Other | Admitting: Internal Medicine

## 2013-03-02 VITALS — BP 124/80 | HR 69 | Temp 98.2°F | Wt 202.0 lb

## 2013-03-02 DIAGNOSIS — L97509 Non-pressure chronic ulcer of other part of unspecified foot with unspecified severity: Secondary | ICD-10-CM

## 2013-03-02 DIAGNOSIS — L97519 Non-pressure chronic ulcer of other part of right foot with unspecified severity: Secondary | ICD-10-CM

## 2013-03-02 MED ORDER — DOXYCYCLINE HYCLATE 100 MG PO TABS: 100.0000 mg | ORAL_TABLET | Freq: Two times a day (BID) | ORAL | Status: DC

## 2013-03-02 NOTE — Patient Instructions (Addendum)
Please recheck PT/INR on Friday 4/18. The antibiotic which has been initiated may raise the PT/INR. The antibiotic was prescribed to cover infection in the skin and possibly bone of the right second toe. Dip gauze in  sterile saline and applied to the wound twice a day. Cover the wound with Telfa , non stick dressing  without any antibiotic ointment. The saline can be purchased at the drugstore or you can make your own .Boil cup of salt in a gallon of water. Store mixture  in a clean container.Report Warning  signs as discussed (red streaks, pus, fever, increasing pain).

## 2013-03-02 NOTE — Progress Notes (Signed)
Subjective:    Patient ID: Arthur [name], male    DOB: [birth year], 79 y.o.   MRN: [id number]  HPI  He states that the bunion of his second toe flared 2-3 days ago; this has been an intermittent problem especially when he wears dress shoes.  This is been associated with some purulent drainage from the toe  He's also developed erythema over the right anterior shin. There's been some clear drainage from shallow ulcers over the anterior aspect of the shin.  He denies associated fever, chills, or sweats. He's had no chest pain, palpitations, or shortness of breath.  He denies a past history of MRSA. He has a history of urticaria with penicillin.    Review of Systems His cardiologist Dr Excell Seltzer noted elevated liver function tests recently. Off his statin liver enzymes continue to rise. Stopping alcohol also made no difference. Approximately 4 weeks ago he was placed on prednisone by Dr Marina Goodell which has been weaned to the present dose.LFTs have improved with steroids. Liver biopsy deferred based on steroid response.  He has been having difficulty sleeping due to subjective hyperactivity with the prednisone.  His PT/INR was 2.1 yesterday     Objective:   Physical Exam Gen.:  well-nourished in appearance. Alert, appropriate and cooperative throughout exam. Eyes: No corneal or conjunctival inflammation noted. Neck: No deformities, masses, or tenderness noted. No neck vein distention at 15  Lungs: Normal respiratory effort; chest expands symmetrically. Lungs are clear to auscultation without rales, wheezes, or increased work of breathing. Heart: Heart rate is minimally irregular; grade 1 systolic murmur is present.  Abdomen: Bowel sounds normal; abdomen soft and nontender. No masses, organomegaly or hernias noted. No hepatojugular reflux present                                 Musculoskeletal/extremities: The second right toe is swollen approximately 1.5 times normal and is associated with  dependent erythema. This partially resolves with elevation of the leg. There is a shallow to 3 mm ulcer over the PIP joint. Purulent material can be expressed from the ulcer. Culture was collected  There is stasis dermatitis as well as erythema which is nonblanching over the right shin. There are several shallow ulcers; 2 of these have existing eschar. One is draining clear serosanguineous material  He has 2+ edema of the right foot and 1+ on the left. Lateral deviation of the MTP joint of the left foot. Second toe overlaps the left first toe  Chronic toenail changes present   Vascular: Dorsalis pedis and  posterior tibial pulses are decreased.  Neurologic: Alert and oriented x3.  Skin: Intact without suspicious lesions or rashes other than lower extremity dermatologic changes. Lymph: No cervical, axillary lymphadenopathy present. Psych: Mood and affect are normal. Normally interactive                                                                                        Assessment & Plan:  #1 ulcer right second toe; cellulitis/abscess clinically. Rule out osteomyelitis.  #2 penicillin allergy  Plan: Wound care referral as  soon as possible. Culture collected. Doxycycline initiated with repeat PT/INR 4/18.

## 2013-03-03 ENCOUNTER — Telehealth: Payer: Self-pay | Admitting: Internal Medicine

## 2013-03-03 NOTE — Telephone Encounter (Signed)
I placed another call to [facility], to try again to see if any sooner appointment possible.  Wound Care is closed Friday, 03/04/13, and 03/10/13 is still the absolute soonest they will be able to see this patient unless someone else cancels.  I called, spoke with patient, he is coming in to see Dr. Alwyn Ren Friday 03/04/13 at 11:00am.

## 2013-03-03 NOTE — Telephone Encounter (Signed)
Hopp please further advise

## 2013-03-03 NOTE — Telephone Encounter (Signed)
FYI he served on [employer] for 10 years;if that makes any difference.He is @ high risk of osteomyelitis. If no earlier appt ; I need to see him tomorrow 4/18

## 2013-03-03 NOTE — Telephone Encounter (Signed)
Patient has an appointment with [facility] on Thursday, 03/10/13.  When I placed a call to Wound Care, I informed them an urgent appointment needed, and this was the absolute soonest they could see patient, and this was due to a cancellation.  [facility] care is scheduling just as far out.  Patient is now calling back stating that 03/10/13 is just not soon enough, and states with his open wound he will be in trouble if he waits that long.  Please advise.

## 2013-03-04 ENCOUNTER — Ambulatory Visit (INDEPENDENT_AMBULATORY_CARE_PROVIDER_SITE_OTHER): Payer: Medicare Other | Admitting: Pharmacist

## 2013-03-04 ENCOUNTER — Ambulatory Visit (INDEPENDENT_AMBULATORY_CARE_PROVIDER_SITE_OTHER): Payer: Medicare Other | Admitting: Internal Medicine

## 2013-03-04 DIAGNOSIS — Z7901 Long term (current) use of anticoagulants: Secondary | ICD-10-CM

## 2013-03-04 DIAGNOSIS — R600 Localized edema: Secondary | ICD-10-CM

## 2013-03-04 DIAGNOSIS — L02619 Cutaneous abscess of unspecified foot: Secondary | ICD-10-CM

## 2013-03-04 DIAGNOSIS — I4891 Unspecified atrial fibrillation: Secondary | ICD-10-CM

## 2013-03-04 DIAGNOSIS — R609 Edema, unspecified: Secondary | ICD-10-CM

## 2013-03-04 DIAGNOSIS — E8809 Other disorders of plasma-protein metabolism, not elsewhere classified: Secondary | ICD-10-CM

## 2013-03-04 MED ORDER — POTASSIUM CHLORIDE CRYS ER 20 MEQ PO TBCR: 20.0000 meq | EXTENDED_RELEASE_TABLET | Freq: Every day | ORAL | Status: DC

## 2013-03-04 MED ORDER — FUROSEMIDE 40 MG PO TABS: 40.0000 mg | ORAL_TABLET | Freq: Every day | ORAL | Status: DC

## 2013-03-04 NOTE — Progress Notes (Signed)
  Subjective:    Patient ID: Arthur [name], male    DOB: 03/09/1925, 79 y.o.   MRN: [id number]  HPI  He has not initiated the saline wet-to-dry treatments yet. He is on doxycycline twice a day. He denies fever, chills, or sweats.  The second toe was less erythematous as of this morning. He denies chest pain, palpitations, shortness of breath.  His PT/INR was 2.3 today.    Review of Systems  He continues to take the prednisone for probable autoimmune hepatitis. On 02/21/13 his albumin was 3.0.  He is on no diuretics.      Objective:   Physical Exam  He appears well-nourished and in no distress  Has no lymphadenopathy about the neck or exam.  Chest is clear with no increased work of breathing  He has a raspy grade 1 systolic murmur  There is faint erythema of this right second toe distally when the foot is elevated. When it is dependent, there is marked rubor of the entire toe. A small ulcer has an early eschar.  The erythema over the right shin appears slightly improved. There still serosanguineous drainage from a shallow ulcer in the mid shin. The erythema may be worse dependent as well  He has 1+-2+ pitting edema of the right foot. There is 1+ edema in the left lower extremity        Assessment & Plan:  #1 cellulitis second right toe and right shin. Clinically he does not have osteomyelitis  #2 marked venous insufficiency with dependent edema  #3 hypoalbuminemia; this is undoubtedly contributing to the edema  Plan: He will continue the doxycycline; PT/INR is recommended for/21. Additionally at that time an A1c and BMET are recommended. Furosemide will be initiated.   He's been asked to initiate wet-to-dry saline dressings 3 times a day to the shin and toe and keep the foot elevated as much as possible.

## 2013-03-04 NOTE — Patient Instructions (Addendum)
Please  schedule  Labs 03/07/13  : BMET,A1c, PT/INR.PLEASE BRING THESE INSTRUCTIONS TO FOLLOW UP  LAB APPOINTMENT.This will guarantee correct labs are drawn, eliminating need for repeat blood sampling ( needle sticks ! ). Diagnoses /Codes: 273.8,995.20,790.29, 782.3,V58.61. Total protein & albumin reflect nutrition; low fat  or plant protein (Ex soy) sources are recommended as supplements.  Dip gauze in  sterile saline and applied to the wound twice a day. Cover the wound with Telfa , non stick dressing  without any antibiotic ointment. The saline can be purchased at the drugstore or you can make your own .Boil cup of salt in a gallon of water. Store mixture  in a clean container.Report Warning  signs as discussed (red streaks, pus, fever, increasing pain).

## 2013-03-05 LAB — WOUND CULTURE: Gram Stain: NONE SEEN

## 2013-03-07 ENCOUNTER — Telehealth: Payer: Self-pay | Admitting: Internal Medicine

## 2013-03-07 ENCOUNTER — Other Ambulatory Visit (INDEPENDENT_AMBULATORY_CARE_PROVIDER_SITE_OTHER): Payer: Medicare Other

## 2013-03-07 ENCOUNTER — Encounter: Payer: Self-pay | Admitting: Internal Medicine

## 2013-03-07 DIAGNOSIS — T887XXA Unspecified adverse effect of drug or medicament, initial encounter: Secondary | ICD-10-CM

## 2013-03-07 DIAGNOSIS — R7309 Other abnormal glucose: Secondary | ICD-10-CM

## 2013-03-07 DIAGNOSIS — Z7901 Long term (current) use of anticoagulants: Secondary | ICD-10-CM

## 2013-03-07 DIAGNOSIS — E8809 Other disorders of plasma-protein metabolism, not elsewhere classified: Secondary | ICD-10-CM

## 2013-03-07 LAB — BASIC METABOLIC PANEL
Creatinine, Ser: 1.6 mg/dL — ABNORMAL HIGH (ref 0.4–1.5)
Glucose, Bld: 219 mg/dL — ABNORMAL HIGH (ref 70–99)
Potassium: 5.2 mEq/L — ABNORMAL HIGH (ref 3.5–5.1)
Sodium: 138 mEq/L (ref 135–145)

## 2013-03-07 LAB — HEPATIC FUNCTION PANEL
ALT: 38 U/L (ref 0–53)
AST: 32 U/L (ref 0–37)
Total Bilirubin: 0.8 mg/dL (ref 0.3–1.2)
Total Protein: 5.7 g/dL — ABNORMAL LOW (ref 6.0–8.3)

## 2013-03-07 LAB — PROTIME-INR: Prothrombin Time: 29.9 s — ABNORMAL HIGH (ref 10.2–12.4)

## 2013-03-07 NOTE — Addendum Note (Signed)
Addended by: Silvio Pate D on: 03/07/2013 09:04 AM   Modules accepted: Orders

## 2013-03-07 NOTE — Telephone Encounter (Signed)
Noted  

## 2013-03-07 NOTE — Telephone Encounter (Signed)
Pt had labs done at PCP office today. Pt calling regarding his LFT's. Pt taking prednisone 30mg  daily.  AST 32 ALT 38  Results in epic. Please advise. Pt has OV with Dr. Marina Goodell 03/15/13

## 2013-03-07 NOTE — Telephone Encounter (Signed)
Hepatic Function panel was drawn by the lab.

## 2013-03-07 NOTE — Telephone Encounter (Signed)
Let patient know that his LFT's are improving. Decrease prednisone to 20 mg daily. Repeat LFT's at next O.V. On 03-15-13. Thanks

## 2013-03-07 NOTE — Telephone Encounter (Signed)
[facility] Triage Call Report Triage Record Num: [id number] Operator: Maryfrances Bunnell Patient Name: Arthur [name] Call Date & Time: 03/04/2013 2:44:25PM Patient Phone: [phone] PCP: Marga Melnick Patient Gender: Male PCP Fax : [phone] Patient DOB: 03-10-1925 Practice Name: [facility] Reason for Call: Caller: Mat Carne; PCP: Marga Melnick; CB#: [phone]; Call regarding Question about visit; Unclear on wound care instructions: Per emr: Dip gauze in sterile saline and applied to the wound twice a day. Cover the wound with Telfa , non stick dressing without any antibiotic ointment. The saline can be purchased at the drugstore or you can make your own .Boil cup of salt in a gallon of water. Store mixture in a clean container.Report Warning signs as discussed (red streaks, pus, fever, increasing pain). Explained instructions to caller with questions answered. Protocol(s) Used: Office Note Recommended Outcome per Protocol: Information Noted and Sent to Office Reason for Outcome: Caller information to office Care Advice: ~

## 2013-03-07 NOTE — Telephone Encounter (Signed)
Get the gauze thoroughly wet with saline and apply to the toe wound. Remove it before it dries. If you cannot purchase  saline; it can be made by boiling a cup of salt water in a gallon of water and storing in a clean container.

## 2013-03-08 NOTE — Telephone Encounter (Signed)
Pt aware.

## 2013-03-10 ENCOUNTER — Encounter (HOSPITAL_BASED_OUTPATIENT_CLINIC_OR_DEPARTMENT_OTHER): Payer: Medicare Other | Attending: Internal Medicine

## 2013-03-10 ENCOUNTER — Ambulatory Visit (HOSPITAL_COMMUNITY)
Admission: RE | Admit: 2013-03-10 | Discharge: 2013-03-10 | Disposition: A | Discharge status: Home / Self Care | Payer: Medicare Other | Source: Ambulatory Visit | Attending: Internal Medicine | Admitting: Internal Medicine

## 2013-03-10 ENCOUNTER — Other Ambulatory Visit (HOSPITAL_BASED_OUTPATIENT_CLINIC_OR_DEPARTMENT_OTHER): Payer: Self-pay | Admitting: Internal Medicine

## 2013-03-10 DIAGNOSIS — R52 Pain, unspecified: Secondary | ICD-10-CM

## 2013-03-10 DIAGNOSIS — R609 Edema, unspecified: Secondary | ICD-10-CM | POA: Insufficient documentation

## 2013-03-10 DIAGNOSIS — L97809 Non-pressure chronic ulcer of other part of unspecified lower leg with unspecified severity: Secondary | ICD-10-CM | POA: Insufficient documentation

## 2013-03-10 DIAGNOSIS — M19079 Primary osteoarthritis, unspecified ankle and foot: Secondary | ICD-10-CM | POA: Insufficient documentation

## 2013-03-10 DIAGNOSIS — Q667 Congenital pes cavus, unspecified foot: Secondary | ICD-10-CM | POA: Insufficient documentation

## 2013-03-10 DIAGNOSIS — I872 Venous insufficiency (chronic) (peripheral): Secondary | ICD-10-CM | POA: Insufficient documentation

## 2013-03-10 DIAGNOSIS — M79609 Pain in unspecified limb: Secondary | ICD-10-CM | POA: Insufficient documentation

## 2013-03-10 DIAGNOSIS — L97509 Non-pressure chronic ulcer of other part of unspecified foot with unspecified severity: Secondary | ICD-10-CM | POA: Insufficient documentation

## 2013-03-11 NOTE — Progress Notes (Signed)
[facility]  NAME:  Arthur [name], Arthur [name] NO.:  [id number]  MEDICAL RECORD NO.:  [id number]      DATE OF BIRTH:  [birth year]  PHYSICIAN:  Maxwell Caul, M.D.      VISIT DATE:                                  OFFICE VISIT   LOCATION:  [facility].  [name] is an 79 year old man, kindly referred by Dr. Marga Melnick for review of wounds on his right anterior leg as well as his right second toe.  Historically, [name] appears to have had chronic venous stasis, although he has not been well aware of this.  He developed a small open area on the right anterior leg perhaps a month to 2 ago.  There was no history of trauma.  He has also developed an area on his second toe on the right.  He describes this is having a bunion, which became infected. He has been on doxycycline with considerable improvement over the last week.  He is not a diabetic and has no prior wound history.  PAST MEDICAL HISTORY: 1. Autoimmune hepatitis, on chronic prednisone. 2. Venous insufficiency. 3. Hypoalbuminemia. 4. Coronary artery disease, status post cardiac stent and remote     appendectomy.  MEDICATIONS: 1. Doxycycline 100 b.i.d. 2. Prednisone 20 mg daily. 3. Toprol 25 daily orally.  PHYSICAL EXAMINATION:  VITAL SIGNS:  Temperature is 97.8, pulse 93, blood pressure 150/83. RESPIRATORY EXAMINATION:  Revealed clear air entry bilaterally with no increased work of breathing. CARDIAC:  Heart sounds are normal.  He has a soft midsystolic murmur that does not radiate. WOUND EXAM:  There is venous stasis on the right leg, minor degree of venous stasis changes on the left.  There is a small open area on the anterior right leg measuring 0.9 x 0.4 x 0.1.  This underwent a nonselective debridement to clean the base of the wound.  Perhaps a more worrisome area here is on the dorsal aspect of his right second toe over the PIP joint.  There is some  erythema here, the patient describes this is a lot better.  There are two open areas separated by a small amount of normal skin;however, palpating the joint elicits some drainage out of the medial opening.  This had already been cultured in Dr. Frederik Pear office and he is growing methicillin-sensitive Staphylococcus aureus and he is on doxycycline because of beta-lactam allergy.  The patient did not appear to have any arterial insufficiency.  ABI is calculated in this clinic was 1.11 on the right.  IMPRESSIONS/PLAN:  Small venous stasis ulceration that underwent debridement as noted above.  To this area, we applied collagen foam under Vaseline gauze and a Profore Lite wrap.  The area over the second toe, we applied silver alginate, Kerlix, and a toe sock.  He will be given a healing sandal for this.  I cautioned him against strenuous exercise on a treadmill, which he seems to enjoy doing.  We did send him for an x-ray of the left second toe, although I am doubtful if the history is accurate that this would be positive (too short in interval). In any case, he is to continue on the doxycycline as previously prescribed by Dr. Alwyn Ren.  My concern here  would be involvement of the underlying joint.  I will continue to follow this expectantly for now.          ______________________________ Maxwell Caul, M.D.     MGR/MEDQ  D:  03/10/2013  T:  03/11/2013  Job:  [id number]

## 2013-03-14 ENCOUNTER — Telehealth: Payer: Self-pay | Admitting: General Practice

## 2013-03-14 NOTE — Telephone Encounter (Signed)
He does not have hypokalemia; potassium chloride prescription should not be filled.

## 2013-03-14 NOTE — Telephone Encounter (Signed)
Left message to call office

## 2013-03-14 NOTE — Telephone Encounter (Signed)
Pt called stating that per his recent labs his potassium was a little on the high side. Pt said that his pharmacy called him stating that they had an Rx for potassium ready for him to pick up. I seen in chart where the potassium looked like it was entered in error. Please advise if pt needs this medication.

## 2013-03-15 ENCOUNTER — Ambulatory Visit (INDEPENDENT_AMBULATORY_CARE_PROVIDER_SITE_OTHER): Payer: Medicare Other | Admitting: Internal Medicine

## 2013-03-15 ENCOUNTER — Telehealth: Payer: Self-pay | Admitting: Internal Medicine

## 2013-03-15 ENCOUNTER — Other Ambulatory Visit (INDEPENDENT_AMBULATORY_CARE_PROVIDER_SITE_OTHER): Payer: Medicare Other

## 2013-03-15 ENCOUNTER — Other Ambulatory Visit: Payer: Self-pay | Admitting: Internal Medicine

## 2013-03-15 ENCOUNTER — Encounter: Payer: Self-pay | Admitting: Internal Medicine

## 2013-03-15 ENCOUNTER — Telehealth: Payer: Self-pay | Admitting: *Deleted

## 2013-03-15 VITALS — BP 100/62 | HR 78 | Ht 74.0 in | Wt 205.0 lb

## 2013-03-15 DIAGNOSIS — R7989 Other specified abnormal findings of blood chemistry: Secondary | ICD-10-CM

## 2013-03-15 DIAGNOSIS — K754 Autoimmune hepatitis: Secondary | ICD-10-CM

## 2013-03-15 LAB — HEPATIC FUNCTION PANEL
Bilirubin, Direct: 0.2 mg/dL (ref 0.0–0.3)
Total Bilirubin: 1.2 mg/dL (ref 0.3–1.2)
Total Protein: 5.9 g/dL — ABNORMAL LOW (ref 6.0–8.3)

## 2013-03-15 NOTE — Telephone Encounter (Signed)
See other encounter.

## 2013-03-15 NOTE — Telephone Encounter (Signed)
This was entered in error; I left a message 4/29 telling him not to take this medication

## 2013-03-15 NOTE — Telephone Encounter (Signed)
Pt was made aware not to take this med in previous encounter from 03-14-13. Pt was advise today at 3:46 pm that this was a error and pharmacy was notified as well for Pt not to take med.Pt was made aware again not to take med as per Dr Alwyn Ren instruction. Pt was also verbally informed by Dr hopper over the phone not to take med. Pt stated that he did not take med.

## 2013-03-15 NOTE — Telephone Encounter (Signed)
Patient called the office after receiving Rx from pharmacy for Klor-Con . Directions are to take 1 tab daily, KCL level 1 week ago was 5.2, do you want this patient to take this medication? He does not take diuretics or potassium wasting meds. Advised patient to not take this tonight until we hear response back from Dr. Alwyn Ren. Please advise.

## 2013-03-15 NOTE — Patient Instructions (Addendum)
Please return to the lab in 2 weeks to have your liver function tested.  Per Dr. Marina Goodell, decrease your Prednisone to 10mg  daily.  Please follow up with Dr. Marina Goodell in 3 months

## 2013-03-15 NOTE — Telephone Encounter (Signed)
Discuss with patient and called pharmacy and advise that Rx was sent in Error Pt is not supposed to be on potassium.

## 2013-03-15 NOTE — Progress Notes (Signed)
HISTORY OF PRESENT ILLNESS:  Arthur [name] is a 79 y.o. male with multiple significant medical problems, who was evaluated initially 01/12/2013 regarding elevated hepatic transaminases. He underwent further laboratory evaluation. 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 He was in [place], we decided against liver biopsy, and initiated prednisone 40 mg. He has had marked improvement in his liver studies. Most recent study from one week ago were normal. On 20 mg of prednisone. Aside from the side effects of difficulty sleeping and decreased appetite he feels well. Prior problems with joint aches that resolved. Pro time is been monitored.  REVIEW OF SYSTEMS:  All non-GI ROS negative except for sleeping problems, ankle edema, toe pain  Past Medical History  Diagnosis Date  . Coronary artery disease     MI, atherectomy 1993  . Hyperlipidemia   . Arrhythmia     1995  . Atrial fibrillation   . History of transient ischemic attack (TIA) 2005    double vision  . Hypertension   . History of gallstones   . 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 Eczema   . Elevated LFTs   . Skin cancer     Past Surgical History  Procedure Laterality Date  . Cholecystectomy    . Cardiac catheterization  04/08/99    LAD 70% INTER 40-50% RCA 30-40%  . Appendectomy    . Coronary angioplasty  04/08/99    LAD  . Coronary angioplasty with stent placement  04/08/99    LAD  . Coronary angioplasty  10/17/97    RCA     Social History KILIAN SCHWARTZ  reports that he quit smoking about 45 years ago. His smoking use included Cigarettes and Cigars. He smoked 0.00 packs per day. He has never used smokeless tobacco. He reports that he does not drink alcohol or use illicit drugs.  family history includes Heart attack (age of onset: 27) in his mother and Pneumonia in his father.  Allergies  Allergen Reactions  . Penicillins Hives       PHYSICAL EXAMINATION: Vital signs: BP 100/62  Pulse 78   Ht 6\' 2"  (1.88 m)  Wt 205 lb (92.987 kg)  BMI 26.31 kg/m2 General: Well-developed, well-nourished, no acute distress HEENT: Sclerae are anicteric, conjunctiva pink. Oral mucosa intact Lungs: Clear Heart: Regular Abdomen: soft, nontender, nondistended, no obvious ascites, no peritoneal signs, normal bowel sounds. No organomegaly. Extremities: No edema. Right foot in walking boot Psychiatric: alert and oriented x3. Cooperative   ASSESSMENT:  #1. Some problems with elevated hepatic transaminases felt to be secondary to autoimmune hepatitis, likely drug induced from statin. Marked improvement with prednisone therapy #2. Multiple medical problems  PLAN:  #1. Decrease prednisone to 10 mg daily #2. Recheck liver tests in 2 weeks #3. Routine office followup in 3 months. Contact the office in the interim for questions or problems

## 2013-03-15 NOTE — Telephone Encounter (Signed)
Pt called and stated that he was returning a call back. Pt said its regarding his potassium medication. thanks

## 2013-03-17 ENCOUNTER — Encounter (HOSPITAL_BASED_OUTPATIENT_CLINIC_OR_DEPARTMENT_OTHER): Payer: Medicare Other | Attending: Internal Medicine

## 2013-03-17 DIAGNOSIS — L97809 Non-pressure chronic ulcer of other part of unspecified lower leg with unspecified severity: Secondary | ICD-10-CM | POA: Insufficient documentation

## 2013-03-17 DIAGNOSIS — L89899 Pressure ulcer of other site, unspecified stage: Secondary | ICD-10-CM | POA: Insufficient documentation

## 2013-03-17 DIAGNOSIS — L8992 Pressure ulcer of unspecified site, stage 2: Secondary | ICD-10-CM | POA: Insufficient documentation

## 2013-03-17 DIAGNOSIS — I872 Venous insufficiency (chronic) (peripheral): Secondary | ICD-10-CM | POA: Insufficient documentation

## 2013-03-18 ENCOUNTER — Ambulatory Visit (INDEPENDENT_AMBULATORY_CARE_PROVIDER_SITE_OTHER): Payer: Medicare Other | Admitting: *Deleted

## 2013-03-18 DIAGNOSIS — I4891 Unspecified atrial fibrillation: Secondary | ICD-10-CM

## 2013-03-18 LAB — POCT INR: INR: 2.4

## 2013-03-21 ENCOUNTER — Telehealth: Payer: Self-pay | Admitting: *Deleted

## 2013-03-21 NOTE — Telephone Encounter (Signed)
Pt left VM stating that he would like a call back to determine what time his appt is for tomorrow. Reviewed Pt chart called Pt back and advise that we do not have any appt schedule for him for tomorrow. Pt ok info and thank me for call back

## 2013-03-29 ENCOUNTER — Other Ambulatory Visit: Payer: Self-pay | Admitting: Internal Medicine

## 2013-03-29 ENCOUNTER — Other Ambulatory Visit (INDEPENDENT_AMBULATORY_CARE_PROVIDER_SITE_OTHER): Payer: Medicare Other

## 2013-03-29 DIAGNOSIS — K754 Autoimmune hepatitis: Secondary | ICD-10-CM

## 2013-03-29 LAB — HEPATIC FUNCTION PANEL
ALT: 42 U/L (ref 0–53)
Bilirubin, Direct: 0.2 mg/dL (ref 0.0–0.3)
Total Bilirubin: 1.1 mg/dL (ref 0.3–1.2)

## 2013-03-31 ENCOUNTER — Other Ambulatory Visit (HOSPITAL_BASED_OUTPATIENT_CLINIC_OR_DEPARTMENT_OTHER): Payer: Self-pay | Admitting: Internal Medicine

## 2013-03-31 DIAGNOSIS — M009 Pyogenic arthritis, unspecified: Secondary | ICD-10-CM

## 2013-04-04 ENCOUNTER — Ambulatory Visit (HOSPITAL_COMMUNITY): Payer: Medicare Other

## 2013-04-04 ENCOUNTER — Ambulatory Visit (HOSPITAL_COMMUNITY)
Admission: RE | Admit: 2013-04-04 | Discharge: 2013-04-04 | Disposition: A | Discharge status: Home / Self Care | Payer: Medicare Other | Source: Ambulatory Visit | Attending: Internal Medicine | Admitting: Internal Medicine

## 2013-04-04 DIAGNOSIS — M949 Disorder of cartilage, unspecified: Secondary | ICD-10-CM | POA: Insufficient documentation

## 2013-04-04 DIAGNOSIS — M009 Pyogenic arthritis, unspecified: Secondary | ICD-10-CM | POA: Insufficient documentation

## 2013-04-04 DIAGNOSIS — M899 Disorder of bone, unspecified: Secondary | ICD-10-CM | POA: Insufficient documentation

## 2013-04-04 DIAGNOSIS — M25473 Effusion, unspecified ankle: Secondary | ICD-10-CM | POA: Insufficient documentation

## 2013-04-04 DIAGNOSIS — M25476 Effusion, unspecified foot: Secondary | ICD-10-CM | POA: Insufficient documentation

## 2013-04-04 MED ORDER — GADOBENATE DIMEGLUMINE 529 MG/ML IV SOLN
10.0000 mL | Freq: Once | INTRAVENOUS | Status: AC | PRN
  Administered 2013-04-04: 10 mL via INTRAVENOUS

## 2013-04-07 ENCOUNTER — Telehealth: Payer: Self-pay | Admitting: Internal Medicine

## 2013-04-07 NOTE — Telephone Encounter (Signed)
Okay to take them. However, watch for diarrhea. I appreciate am checking with [place]

## 2013-04-07 NOTE — Telephone Encounter (Signed)
Pt states he has an infection in his toe and has been going to the wound center. Pt was told the infection is in a bone in his toe. Pt has been given 2 antibiotics to take and he wants to make sure Dr. Marina Goodell thinks it is ok for him to take them. Pt has been given Clindamycin HCL and Doxycycline for 10days. Dr. Marina Goodell please advise.

## 2013-04-08 ENCOUNTER — Ambulatory Visit (INDEPENDENT_AMBULATORY_CARE_PROVIDER_SITE_OTHER): Payer: Medicare Other | Admitting: *Deleted

## 2013-04-08 DIAGNOSIS — I4891 Unspecified atrial fibrillation: Secondary | ICD-10-CM

## 2013-04-08 LAB — POCT INR: INR: 1.5

## 2013-04-08 NOTE — Telephone Encounter (Signed)
I have left a detailed message for the patient with Dr. Lamar Sprinkles response.  I have asked that they call back for any questions or concerns

## 2013-04-15 ENCOUNTER — Ambulatory Visit (INDEPENDENT_AMBULATORY_CARE_PROVIDER_SITE_OTHER): Payer: Medicare Other | Admitting: *Deleted

## 2013-04-15 DIAGNOSIS — I4891 Unspecified atrial fibrillation: Secondary | ICD-10-CM

## 2013-04-15 LAB — POCT INR: INR: 2

## 2013-04-20 ENCOUNTER — Encounter: Payer: Self-pay | Admitting: Internal Medicine

## 2013-04-20 ENCOUNTER — Ambulatory Visit (INDEPENDENT_AMBULATORY_CARE_PROVIDER_SITE_OTHER): Payer: Medicare Other | Admitting: Internal Medicine

## 2013-04-20 VITALS — BP 113/75 | HR 59 | Temp 98.1°F | Ht 74.0 in | Wt 200.0 lb

## 2013-04-20 DIAGNOSIS — M869 Osteomyelitis, unspecified: Secondary | ICD-10-CM | POA: Insufficient documentation

## 2013-04-20 LAB — CBC WITH DIFFERENTIAL/PLATELET
Basophils Relative: 0 % (ref 0–1)
Eosinophils Absolute: 0 10*3/uL (ref 0.0–0.7)
Eosinophils Relative: 0 % (ref 0–5)
Lymphs Abs: 3.3 10*3/uL (ref 0.7–4.0)
MCH: 31.8 pg (ref 26.0–34.0)
MCHC: 34 g/dL (ref 30.0–36.0)
MCV: 93.6 fL (ref 78.0–100.0)
Neutrophils Relative %: 53 % (ref 43–77)
Platelets: 202 10*3/uL (ref 150–400)
RDW: 17.5 % — ABNORMAL HIGH (ref 11.5–15.5)

## 2013-04-20 MED ORDER — LEVOFLOXACIN 750 MG PO TABS: 750.0000 mg | ORAL_TABLET | Freq: Every day | ORAL | Status: DC

## 2013-04-20 NOTE — Assessment & Plan Note (Addendum)
Based on MRI findings and clinical exam, this is concerning for active osteomyelitis. He has been on doxycycline and clindamycin so bone biopsy likely to be unrevealing. I will therefore start him on therapy with vancomycin and Levaquin. He will take this for presumed 6 weeks. He is on prednisone so CRP and sedimentation rate likely to be unhelpful. I will check his creatinine and have a PICC line placed. He will come back in 2 weeks for followup to assure he is doing well. I will send a message to his Coumadin clinic. I also will send a message to gastroenterology to see his prednisone can be tapered at all anymore.  Vancomycin for 6 weeks with trough goal of 15-20 per home health protocol, weekly vanco trough ,cbc, cmp to [facility].  Keep picc until seen by id.

## 2013-04-20 NOTE — Progress Notes (Signed)
  Subjective:    Patient ID: Arthur [name], male    DOB: 08/08/1925, 79 y.o.   MRN: [id number]  HPI He comes in here as a new patient. He has a chronic ulcer over his left second toe that has been ongoing for 2-3 months and has been difficult to heal. He had an MRI done by Dr. Leanord Hawking of wound care and was concerning for osteomyelitis at the same area. Also some suggestion of septic arthritis in the toe. He has had no fever or chills. No discharge or drainage coming out of the toe. No significant pain. He does not have any vascular insufficiency or peripheral vascular disease to his knowledge. MRI reviewed and does show osteomyelitis and septic arthritis of the toe.  He does have autoimmune hepatitis and is on chronic prednisone for this. He equates it is elevated transaminases to Lipitor as the inciting event. He is down to 10 mg of prednisone and is most recent LFTs are much improved though some elevation.  No history of significant tobacco use.   Review of Systems  Constitutional: Negative for fever and chills.  Eyes: Negative for redness and visual disturbance.  Respiratory: Negative for shortness of breath.   Cardiovascular: Negative for chest pain.       Some leg swell  Gastrointestinal: Negative for nausea and diarrhea.  Musculoskeletal: Negative for myalgias, joint swelling and arthralgias.  Skin: Negative for rash.  Allergic/Immunologic: Negative for immunocompromised state.  Neurological: Negative for dizziness, light-headedness and headaches.  Hematological: Negative for adenopathy.  Psychiatric/Behavioral: The patient is not nervous/anxious.        Objective:   Physical Exam  Constitutional: He appears well-developed and well-nourished. No distress.  HENT:  Mouth/Throat: Oropharynx is clear and moist. No oropharyngeal exudate.  Eyes: Right eye exhibits no discharge. Left eye exhibits no discharge.  Cardiovascular: Normal rate, regular rhythm and normal heart sounds.   No  murmur heard. Pulmonary/Chest: Effort normal and breath sounds normal. No respiratory distress. He has no wheezes.  Abdominal: Soft. Bowel sounds are normal. He exhibits no distension. There is no tenderness.  Musculoskeletal:  Left second toe with 3-4 mm round ulcer with no significant pus or drainage and is on the joint  Skin: No rash noted.  Psychiatric: He has a normal mood and affect.          Assessment & Plan:

## 2013-04-21 ENCOUNTER — Encounter: Payer: Self-pay | Admitting: Internal Medicine

## 2013-04-21 ENCOUNTER — Telehealth: Payer: Self-pay

## 2013-04-21 ENCOUNTER — Ambulatory Visit (HOSPITAL_COMMUNITY)
Admission: RE | Admit: 2013-04-21 | Discharge: 2013-04-21 | Disposition: A | Discharge status: Home / Self Care | Payer: Medicare Other | Source: Ambulatory Visit | Attending: Internal Medicine | Admitting: Internal Medicine

## 2013-04-21 ENCOUNTER — Telehealth: Payer: Self-pay | Admitting: *Deleted

## 2013-04-21 ENCOUNTER — Encounter (HOSPITAL_BASED_OUTPATIENT_CLINIC_OR_DEPARTMENT_OTHER): Payer: Medicare Other | Attending: Internal Medicine

## 2013-04-21 ENCOUNTER — Other Ambulatory Visit: Payer: Self-pay | Admitting: Internal Medicine

## 2013-04-21 DIAGNOSIS — I872 Venous insufficiency (chronic) (peripheral): Secondary | ICD-10-CM | POA: Insufficient documentation

## 2013-04-21 DIAGNOSIS — L97809 Non-pressure chronic ulcer of other part of unspecified lower leg with unspecified severity: Secondary | ICD-10-CM | POA: Insufficient documentation

## 2013-04-21 DIAGNOSIS — L8992 Pressure ulcer of unspecified site, stage 2: Secondary | ICD-10-CM | POA: Insufficient documentation

## 2013-04-21 DIAGNOSIS — M869 Osteomyelitis, unspecified: Secondary | ICD-10-CM

## 2013-04-21 DIAGNOSIS — K754 Autoimmune hepatitis: Secondary | ICD-10-CM

## 2013-04-21 DIAGNOSIS — L89899 Pressure ulcer of other site, unspecified stage: Secondary | ICD-10-CM | POA: Insufficient documentation

## 2013-04-21 LAB — COMPLETE METABOLIC PANEL WITH GFR
ALT: 35 U/L (ref 0–53)
AST: 37 U/L (ref 0–37)
Albumin: 3.5 g/dL (ref 3.5–5.2)
CO2: 27 mEq/L (ref 19–32)
Calcium: 9.6 mg/dL (ref 8.4–10.5)
Chloride: 103 mEq/L (ref 96–112)
Creat: 1.51 mg/dL — ABNORMAL HIGH (ref 0.50–1.35)
GFR, Est African American: 48 mL/min — ABNORMAL LOW
Potassium: 6.2 mEq/L — ABNORMAL HIGH (ref 3.5–5.3)
Total Protein: 6 g/dL (ref 6.0–8.3)

## 2013-04-21 NOTE — Telephone Encounter (Signed)
Message copied by Chrystie Nose on Thu Apr 21, 2013 11:45 AM ------      Message from: Hilarie Fredrickson      Created: Thu Apr 21, 2013 11:41 AM      Regarding: Liver tests       Please contact the patient and let him know that I reviewed his liver tests from yesterday. His liver tests are now normal. Reduce his prednisone to 5 mg daily. Repeat liver tests in 4 weeks to make sure that they continue to be normal on this lower dosage. Convert to a phone note. Thank you ------

## 2013-04-21 NOTE — Telephone Encounter (Signed)
Left message for pt to call back.  Spoke with pt and he is aware. Order in epic. 

## 2013-04-21 NOTE — Telephone Encounter (Signed)
Message copied by Andree Coss on Thu Apr 21, 2013  9:46 AM ------      Message from: Gardiner Barefoot      Created: Thu Apr 21, 2013  8:56 AM       After reviewing his labs, he should take his levaquin every other day instead of daily (750mg  every other day).  This is due to renal function. thanks ------

## 2013-04-21 NOTE — Telephone Encounter (Signed)
Patient's wife notified of dosing change from levaquin daily to levaquin every other day.  Pt's wife verbalized understanding, will notify him. Andree Coss, RN

## 2013-04-22 ENCOUNTER — Telehealth (HOSPITAL_COMMUNITY): Payer: Self-pay | Admitting: *Deleted

## 2013-04-25 ENCOUNTER — Telehealth: Payer: Self-pay | Admitting: *Deleted

## 2013-04-25 NOTE — Telephone Encounter (Signed)
Message copied by Carmela Hurt on Mon Apr 25, 2013  9:29 AM ------      Message from: Gardiner Barefoot      Created: Wed Apr 20, 2013  5:03 PM       I have seen this patient for osteomyelitis and would like to start him on vancomycin and levaquin.  His levaquin of course may increase the warfarin so let me know if you think that would be a problem.        Thanks,      [employer], ID ------

## 2013-04-25 NOTE — Telephone Encounter (Signed)
Spoke with home health nurse while she was in pts home, gave orders for INR check 04/28/2013 for Levaquin started 04/20/2013, he will take this for presumed 6 weeks. Patient also on Vanc IV. This per Dr Staci Righter ID MD. He has a chronic ulcer over his left second toe that has been ongoing for 2-3 months and has been difficult to heal., probably osteomyelitis.

## 2013-04-28 ENCOUNTER — Telehealth: Payer: Self-pay | Admitting: *Deleted

## 2013-04-28 ENCOUNTER — Ambulatory Visit (INDEPENDENT_AMBULATORY_CARE_PROVIDER_SITE_OTHER): Payer: Medicare Other | Admitting: Internal Medicine

## 2013-04-28 DIAGNOSIS — I4891 Unspecified atrial fibrillation: Secondary | ICD-10-CM

## 2013-04-28 LAB — POCT INR: INR: 2.6

## 2013-04-28 NOTE — Telephone Encounter (Signed)
This pt is followed in the coumadin clinic. I will route this message to them for follow-up of INR.

## 2013-04-28 NOTE — Telephone Encounter (Signed)
Spoke with pt and Alexis with [facility].  INR dosed.  See anti-coag visit note for details.

## 2013-04-28 NOTE — Telephone Encounter (Signed)
ALEXIS  CALLED FROM [facility]    [phone] RE PT'S LABS  PT  30.7 INR  2.6  PT TAKING COUMADIN AND  LEVAQUIN   WILL  FORWARD TO DR Excell Seltzer FOR REVIEW AND RECOMMENDATIONS./CY

## 2013-05-02 ENCOUNTER — Telehealth: Payer: Self-pay | Admitting: Cardiovascular Disease

## 2013-05-02 NOTE — Telephone Encounter (Signed)
Per DOD Dr Clifton James, pt may stop coumadin 5 days prior to toe amputation. Pt on coumadin for afib.

## 2013-05-02 NOTE — Telephone Encounter (Signed)
Taken to MR to be faxed. 

## 2013-05-02 NOTE — Telephone Encounter (Signed)
New problem    Need to come of coumadin 5 prior to surgery  . 2 right toe amputee.

## 2013-05-03 ENCOUNTER — Telehealth: Payer: Self-pay | Admitting: Cardiovascular Disease

## 2013-05-03 NOTE — Telephone Encounter (Signed)
Call was transferred from GD/scheduling-Peter Daldorf's office is calling stating they did not receive verification for patient to stop coumadin. Fax number was verified and refaxed to [phone] at 3:54 PM/djc

## 2013-05-04 ENCOUNTER — Encounter: Payer: Self-pay | Admitting: Internal Medicine

## 2013-05-04 ENCOUNTER — Ambulatory Visit (INDEPENDENT_AMBULATORY_CARE_PROVIDER_SITE_OTHER): Payer: Medicare Other | Admitting: Internal Medicine

## 2013-05-04 ENCOUNTER — Other Ambulatory Visit: Payer: Self-pay | Admitting: Orthopaedic Surgery

## 2013-05-04 ENCOUNTER — Telehealth: Payer: Self-pay | Admitting: Pharmacist

## 2013-05-04 VITALS — BP 125/77 | HR 53 | Temp 97.5°F | Wt 211.0 lb

## 2013-05-04 DIAGNOSIS — M869 Osteomyelitis, unspecified: Secondary | ICD-10-CM

## 2013-05-04 MED ORDER — RIFAMPIN 300 MG PO CAPS: 300.0000 mg | ORAL_CAPSULE | Freq: Two times a day (BID) | ORAL | Status: DC

## 2013-05-04 MED ORDER — DOXYCYCLINE HYCLATE 100 MG PO TABS: 100.0000 mg | ORAL_TABLET | Freq: Two times a day (BID) | ORAL | Status: DC

## 2013-05-04 NOTE — Telephone Encounter (Signed)
Pt has procedure on 6/24.  He has already been cleared to hold Coumadin x 5 days prior.  He is aware to take last dose today.  Instructed to restart at previous dose after procedure and appt made for follow up 1 week post procedure.

## 2013-05-04 NOTE — Progress Notes (Signed)
Per Dr Luciana Axe PICC removed.  45 cm PICC removed from patient's left arm. Two sutures in place which were removed. PICC site unremarkable.  Petroleum dressing applied to area and patient advised no heavy lifting with dressing to remain for 24 hours.  Pt tolerated the procedure well.  Laurell Josephs, RN

## 2013-05-04 NOTE — Telephone Encounter (Signed)
New problem    Pt has a question about stopping coumadin for a procedure

## 2013-05-04 NOTE — Assessment & Plan Note (Signed)
He is going to have definitive therapy. Since he is going to have amputation, I will convert him to oral therapy which he can continue until his surgery and then I would continue for at least a week post surgery. This is to assure he has no transient bacteremia. I will use doxycycline since he has renal insufficiency and also add rifampin.

## 2013-05-04 NOTE — Progress Notes (Signed)
  Subjective:    Patient ID: Arthur [name], male    DOB: 1924/11/22, 79 y.o.   MRN: [id number]  HPI He comes in for followup of osteomyelitis. He developed a nonhealing wound on his right second toe and despite almost 3 months of wound care did not improve. He he had an MRI that was consistent with osteomyelitis and some joint effusion. He was then started on him. Therapy with vancomycin and Levaquin, renally dosed. He then saw orthopedics and after evaluation and discussion, it was decided that he will have his second toe amputated next week. He has had no significant problems with the antibiotics in regards to diarrhea, rashes or otherwise no it has been disruptive in his life that with the infusion. He does not have any new complaints or new fever or chills.   Review of Systems  Constitutional: Negative for fever, chills and fatigue.  Gastrointestinal: Negative for nausea, abdominal pain and diarrhea.  Musculoskeletal: Negative for myalgias and arthralgias.  Skin: Negative for rash.  Neurological: Negative for dizziness and headaches.  Hematological: Negative for adenopathy.       Objective:   Physical Exam  Constitutional: He appears well-developed and well-nourished. No distress.  Cardiovascular: Normal rate, regular rhythm and normal heart sounds.   No murmur heard. Skin: No rash noted.          Assessment & Plan:

## 2013-05-05 ENCOUNTER — Encounter (HOSPITAL_BASED_OUTPATIENT_CLINIC_OR_DEPARTMENT_OTHER): Payer: Self-pay | Admitting: *Deleted

## 2013-05-05 ENCOUNTER — Telehealth: Payer: Self-pay | Admitting: Cardiovascular Disease

## 2013-05-05 NOTE — Progress Notes (Signed)
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

## 2013-05-05 NOTE — Telephone Encounter (Signed)
Will forward to the coumadin clinic for clarification.

## 2013-05-05 NOTE — Telephone Encounter (Signed)
New Problem:    Will be discharging the patient on Monday and will not be able to take the patient's INR on 6/30.  If INR still needed she would need to know by tomorrow so it can be drawn on Monday.  Please call back.

## 2013-05-05 NOTE — Telephone Encounter (Signed)
Pt off coumadin for 5 days for surgery on 6/24. Pt already has appt on 05/17/13 to f/u with [place] post Sx

## 2013-05-06 ENCOUNTER — Other Ambulatory Visit: Payer: Self-pay | Admitting: Cardiovascular Disease

## 2013-05-06 NOTE — H&P (Signed)
Arthur [name] is an 79 y.o. male.   Chief Complaint: Right second toe osteomyelitis HPI: Arthur [name] is an 79 year old man is complaining of right second toe pain and infection.  He is had a rigid hammertoe for years over the lastNumber of weeks to months developed a wound that will not heal and ended up with an infection.Dr. Alwyn Ren has sent him to the wound center and he has had various treatments.  He is currently on IV vancomycin.  He is currently not feeling sick or running any fevers.  We have discussed proceeding with a second toe amputation.  MRI scan dated 04/04/13 shows bone erosions consistent with osteomyelitis of the second toe.  Past Medical History  Diagnosis Date  . Coronary artery disease     MI, atherectomy 1993  . Hyperlipidemia   . Arrhythmia     1995  . Atrial fibrillation   . History of transient ischemic attack (TIA) 2005    double vision  . Hypertension   . History of gallstones   . 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 Eczema   . Elevated LFTs   . Skin cancer     Past Surgical History  Procedure Laterality Date  . Cardiac catheterization  04/08/99    LAD 70% INTER 40-50% RCA 30-40%  . Appendectomy    . Coronary angioplasty  04/08/99    LAD  . Coronary angioplasty with stent placement  04/08/99    LAD  . Coronary angioplasty  10/17/97    RCA   . Melanoma excision  2013    lt arm  . Tonsillectomy      Family History  Problem Relation Age of Onset  . Heart attack Mother 56    MI  . Pneumonia Father    Social History:  reports that he quit smoking about 45 years ago. His smoking use included Cigarettes and Cigars. He smoked 0.00 packs per day. He has never used smokeless tobacco. He reports that he drinks about 5.0 ounces of alcohol per week. He reports that he does not use illicit drugs.  Allergies:  Allergies  Allergen Reactions  . Penicillins Hives    No prescriptions prior to admission    No results found for this or any previous visit  (from the past 48 hour(s)). No results found.  Review of Systems  Constitutional: Negative.   HENT: Negative.   Eyes: Negative.   Respiratory: Negative.   Cardiovascular: Positive for palpitations.  Gastrointestinal: Negative.   Genitourinary: Negative.   Musculoskeletal: Positive for joint pain.  Skin: Negative.   Neurological: Negative.   Endo/Heme/Allergies: Negative.   Psychiatric/Behavioral: Negative.     There were no vitals taken for this visit. Physical Exam  Constitutional: He is oriented to person, place, and time. He appears well-nourished.  HENT:  Head: Atraumatic.  Eyes: Pupils are equal, round, and reactive to light.  Neck: Normal range of motion.  Cardiovascular: Normal heart sounds.   Respiratory: Breath sounds normal.  GI: Bowel sounds are normal.  Musculoskeletal:  Right second toe is moderately swollen and red.  He does have a small dorsal wound.  There is no exposed bone.  Neurological: He is oriented to person, place, and time.  Skin: Skin is dry.  Psychiatric: He has a normal mood and affect.     Assessment/Plan Assessment: Right second toe osteomyelitis.  Patient is on chronic Coumadin for atrial fibrillation Plan: We would like to proceed with a second toe right foot  amputation to eliminate his osteomyelitis.  I think at that point he would be able to come off his IV antibiotics.  We have discussed the risk of anesthesia and infection related to this operation.  He will be off his Coumadin for a few days preoperatively and then we'll restart it that night.  Arthur [name] R 05/06/2013, 1:09 PM

## 2013-05-09 ENCOUNTER — Encounter: Payer: Self-pay | Admitting: Nurse Practitioner

## 2013-05-09 ENCOUNTER — Encounter (HOSPITAL_BASED_OUTPATIENT_CLINIC_OR_DEPARTMENT_OTHER)
Admission: RE | Admit: 2013-05-09 | Discharge: 2013-05-09 | Disposition: A | Discharge status: Home / Self Care | Payer: Medicare Other | Source: Ambulatory Visit | Attending: Orthopaedic Surgery | Admitting: Orthopaedic Surgery

## 2013-05-09 ENCOUNTER — Telehealth: Payer: Self-pay | Admitting: *Deleted

## 2013-05-09 ENCOUNTER — Telehealth: Payer: Self-pay | Admitting: Cardiovascular Disease

## 2013-05-09 ENCOUNTER — Ambulatory Visit (INDEPENDENT_AMBULATORY_CARE_PROVIDER_SITE_OTHER): Payer: Medicare Other | Admitting: Nurse Practitioner

## 2013-05-09 VITALS — BP 120/80 | HR 68 | Ht 74.0 in | Wt 209.0 lb

## 2013-05-09 DIAGNOSIS — I4891 Unspecified atrial fibrillation: Secondary | ICD-10-CM

## 2013-05-09 DIAGNOSIS — R609 Edema, unspecified: Secondary | ICD-10-CM

## 2013-05-09 LAB — BASIC METABOLIC PANEL
BUN: 28 mg/dL — ABNORMAL HIGH (ref 6–23)
Calcium: 9 mg/dL (ref 8.4–10.5)
GFR calc Af Amer: 45 mL/min — ABNORMAL LOW (ref >90)
GFR calc non Af Amer: 39 mL/min — ABNORMAL LOW (ref >90)
Potassium: 4.8 mEq/L (ref 3.5–5.1)
Sodium: 134 mEq/L — ABNORMAL LOW (ref 135–145)

## 2013-05-09 LAB — APTT: aPTT: 31 seconds (ref 24–37)

## 2013-05-09 LAB — PROTIME-INR: INR: 1.04 (ref 0.00–1.49)

## 2013-05-09 MED ORDER — FUROSEMIDE 40 MG PO TABS: ORAL_TABLET | ORAL | Status: DC

## 2013-05-09 NOTE — Progress Notes (Signed)
Arthur [name] Date of Birth: 01-Sep-1925 Medical Record #[id number]  History of Present Illness: Arthur [name] is seen back today for a work in visit. Seen for Dr. Excell Seltzer. Former patient of Dr. Ronnald Nian and also my neighbor. Last seen here in November. He has a history of CAD with past septal infarction and inferior infarction in the 1990's. Had stenting of the LAD in 2000. Remains on chronic anticoagulation for atrial fib. Other issues include HTN, HLD and OA. Normal LV function per remote echo back in 2005. He was evaluated by GI initially in 01/12/2013 regarding elevated hepatic transaminases. He underwent further laboratory evaluation. 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 He was in [place] and it was decided against having a liver biopsy, and initiating prednisone therapy. Most recently has had nonhealing ulcer on his toe - osteomyelitis - and will be having a toe amputated tomorrow.   The nurse from [facility] called today to report increasing edema. This is their last visit to see him.   Comes in today. He is here alone. He has had a tough year so far. Lost his son earlier this year. He and [name] continue to struggle with that. Has had lots of health issues as well. While here in November had his labs checked and his LFTs were up. Lipitor was cut back but his LFTs went higher. Sent to GI - Lipitor stopped. Prednisone was started which "wired him up". LFTs have improved. Then had a nonhealing ulcer on his right foot - sent to the wound clinic and subsequently to ID - now for toe amputation tomorrow. He does not think he has had a doppler study to evaluate the blood flow to his legs. Over the last several weeks he has had worsening edema - up to his thighs. Weight is up. He is NOT short of breath. No cough. Says it will do down some overnight. Salt use is questionable. He does go out to eat several times a week. No chest pain reported. No awareness of his atrial fib.   Current Outpatient  Prescriptions  Medication Sig Dispense Refill  . doxycycline (VIBRA-TABS) 100 MG tablet Take 1 tablet (100 mg total) by mouth 2 (two) times daily.  28 tablet  0  . metoprolol tartrate (LOPRESSOR) 25 MG tablet Take 1 tablet (25 mg total) by mouth daily.  90 tablet  3  . predniSONE (DELTASONE) 10 MG tablet Take 5 mg by mouth daily.       . rifampin (RIFADIN) 300 MG capsule Take 1 capsule (300 mg total) by mouth 2 (two) times daily.  28 capsule  0  . warfarin (COUMADIN) 5 MG tablet TAKE 1 TABLET AS DIRECTED  90 tablet  1   No current facility-administered medications for this visit.    Allergies  Allergen Reactions  . Penicillins Hives    Past Medical History  Diagnosis Date  . Coronary artery disease     MI, atherectomy 1993  . Hyperlipidemia   . Arrhythmia     1995  . Atrial fibrillation   . History of transient ischemic attack (TIA) 2005    double vision  . Hypertension   . History of gallstones   . 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 Eczema   . Elevated LFTs   . Skin cancer     Past Surgical History  Procedure Laterality Date  . Cardiac catheterization  04/08/99    LAD 70% INTER 40-50% RCA 30-40%  . Appendectomy    .  Coronary angioplasty  04/08/99    LAD  . Coronary angioplasty with stent placement  04/08/99    LAD  . Coronary angioplasty  10/17/97    RCA   . Melanoma excision  2013    lt arm  . Tonsillectomy      History  Smoking status  . Former Smoker  . Types: Cigarettes, Cigars  . Quit date: 11/18/1967  Smokeless tobacco  . Never Used    Comment: 1-2 cigars a day    History  Alcohol Use  . 5.0 oz/week  . 10 drink(s) per week    Comment: 2-3 glasses of wine in the evening    Family History  Problem Relation Age of Onset  . Heart attack Mother 76    MI  . Pneumonia Father     Review of Systems: The review of systems is per the HPI.  All other systems were reviewed and are negative.  Physical Exam: BP 120/80  Pulse 68  Ht 6\' 2"   (1.88 m)  Wt 209 lb (94.802 kg)  BMI 26.82 kg/m2 Patient is very pleasant and in no acute distress. Weight is up 11 pounds since last visit. Skin is warm and dry. Color is normal.  HEENT is unremarkable. Normocephalic/atraumatic. PERRL. Sclera are nonicteric. Neck is supple. No masses. No JVD. Lungs are clear. Cardiac exam shows an irregular rhythm. Rate is ok. Abdomen is soft. Extremities are with over 3+ edema, extending up to the thighs. Right foot in a boot. Gait and ROM are intact. No gross neurologic deficits noted.  LABORATORY DATA: Lab Results  Component Value Date   WBC 9.1 04/20/2013   HGB 14.4 04/20/2013   HCT 42.4 04/20/2013   PLT 202 04/20/2013   GLUCOSE 135* 05/09/2013   CHOL 133 12/09/2012   TRIG 62.0 12/09/2012   HDL 39.30 12/09/2012   LDLCALC 81 12/09/2012   ALT 35 04/20/2013   AST 37 04/20/2013   NA 134* 05/09/2013   K 4.8 05/09/2013   CL 99 05/09/2013   CREATININE 1.55* 05/09/2013   BUN 28* 05/09/2013   CO2 24 05/09/2013   TSH 1.64 10/09/2009   PSA 5.69* 10/09/2009   INR 1.04 05/09/2013   HGBA1C 7.2* 03/07/2013     Assessment / Plan: 1. Swelling of the lower legs - probably venous insufficiency - he says he can put on support stockings. Would try extra large ACE wraps as well if needed. Will add Lasix 40 mg a day for a week - first dose tonight - skip tomorrow since he is having his surgery - resume on Wednesday - then cut back to 20 mg. He is coming next Tuesday for an INR check. Will check BMET that day and have the coumadin clinic let me know he is here so we can look at his legs. For now, will hold off on an echo - he is not short of breath.   2. Atrial fib - this is chronic  3. CAD - no chest pain reported.   Patient is agreeable to this plan and will call if any problems develop in the interim.   Rosalio Macadamia, RN, ANP-C [facility] [address] [place], [place]  [address]

## 2013-05-09 NOTE — Telephone Encounter (Signed)
Alexis, from [facility], calls b/c of increasing edema of bilateral extremities with 3+ pitting edema. States pt is DOE but "no more than usual". She has been following pt for 3 weeks. He is for toe amputation tomorrow.  He is not on any diuretics. He is due in November for his yearly appt with Dr. Excell Seltzer.  States she was told to call his cardiologist office by ? MD office.  Pt was seen by Dr. Luciana Axe on 6/4 Wt 200                                              & 6/18 Wt 211  Jon Gills has noted since edema began to worsen last week she has performed ankle measurements. Last week ankles measured 26 cm Today ankle measurement is 32 cm  Pt will see Norma Fredrickson NP today at 3pm for increased edema Mylo Red RN

## 2013-05-09 NOTE — Telephone Encounter (Signed)
New problem   The pt has increase swelling of leg w/rt toe amputation sched for tomorrow. Please call Jon Gills

## 2013-05-09 NOTE — Patient Instructions (Addendum)
I am adding Lasix 40 mg a day - take your first dose tonight, then each day starting on Wednesday - take this for one week and then cut back to just a half each morning.  We will check bloodwork next Tuesday with your coumadin check  Show the girls in the Coumadin clinic your legs next week - they will come talk with me.  Call the [facility] office at [phone] if you have any questions, problems or concerns.

## 2013-05-09 NOTE — Telephone Encounter (Signed)
Pt is to have his toe amputated tomorrow d/t osteomyelitis.  [facility] RN Jon Gills calling to advise [place] that the patient has increased edema.  Jon Gills would like to hold of on his [facility] d/c until next week because of this increased edema.  She will also let his cardiologist's office know of her findings.

## 2013-05-10 ENCOUNTER — Ambulatory Visit (HOSPITAL_BASED_OUTPATIENT_CLINIC_OR_DEPARTMENT_OTHER)
Admission: RE | Admit: 2013-05-10 | Discharge: 2013-05-10 | Disposition: A | Discharge status: Home / Self Care | Payer: Medicare Other | Source: Ambulatory Visit | Attending: Orthopaedic Surgery | Admitting: Orthopaedic Surgery

## 2013-05-10 ENCOUNTER — Encounter (HOSPITAL_BASED_OUTPATIENT_CLINIC_OR_DEPARTMENT_OTHER): Payer: Self-pay | Admitting: *Deleted

## 2013-05-10 ENCOUNTER — Encounter (HOSPITAL_BASED_OUTPATIENT_CLINIC_OR_DEPARTMENT_OTHER)
Admission: RE | Disposition: A | Discharge status: Home / Self Care | Payer: Self-pay | Source: Ambulatory Visit | Attending: Orthopaedic Surgery

## 2013-05-10 ENCOUNTER — Ambulatory Visit (HOSPITAL_BASED_OUTPATIENT_CLINIC_OR_DEPARTMENT_OTHER): Payer: Medicare Other | Admitting: Anesthesiology

## 2013-05-10 ENCOUNTER — Encounter (HOSPITAL_BASED_OUTPATIENT_CLINIC_OR_DEPARTMENT_OTHER): Payer: Self-pay | Admitting: Anesthesiology

## 2013-05-10 DIAGNOSIS — Z880 Allergy status to penicillin: Secondary | ICD-10-CM | POA: Insufficient documentation

## 2013-05-10 DIAGNOSIS — L259 Unspecified contact dermatitis, unspecified cause: Secondary | ICD-10-CM | POA: Insufficient documentation

## 2013-05-10 DIAGNOSIS — M86171 Other acute osteomyelitis, right ankle and foot: Secondary | ICD-10-CM

## 2013-05-10 DIAGNOSIS — Z85828 Personal history of other malignant neoplasm of skin: Secondary | ICD-10-CM | POA: Insufficient documentation

## 2013-05-10 DIAGNOSIS — Z8673 Personal history of transient ischemic attack (TIA), and cerebral infarction without residual deficits: Secondary | ICD-10-CM | POA: Insufficient documentation

## 2013-05-10 DIAGNOSIS — Z87891 Personal history of nicotine dependence: Secondary | ICD-10-CM | POA: Insufficient documentation

## 2013-05-10 DIAGNOSIS — I2510 Atherosclerotic heart disease of native coronary artery without angina pectoris: Secondary | ICD-10-CM | POA: Insufficient documentation

## 2013-05-10 DIAGNOSIS — Z7901 Long term (current) use of anticoagulants: Secondary | ICD-10-CM | POA: Insufficient documentation

## 2013-05-10 DIAGNOSIS — M869 Osteomyelitis, unspecified: Secondary | ICD-10-CM | POA: Insufficient documentation

## 2013-05-10 DIAGNOSIS — I10 Essential (primary) hypertension: Secondary | ICD-10-CM | POA: Insufficient documentation

## 2013-05-10 DIAGNOSIS — I252 Old myocardial infarction: Secondary | ICD-10-CM | POA: Insufficient documentation

## 2013-05-10 DIAGNOSIS — I4891 Unspecified atrial fibrillation: Secondary | ICD-10-CM | POA: Insufficient documentation

## 2013-05-10 DIAGNOSIS — E785 Hyperlipidemia, unspecified: Secondary | ICD-10-CM | POA: Insufficient documentation

## 2013-05-10 HISTORY — PX: AMPUTATION: SHX166

## 2013-05-10 SURGERY — AMPUTATION DIGIT
Anesthesia: General | Site: Foot | Laterality: Right | Wound class: Dirty or Infected

## 2013-05-10 MED ORDER — BUPIVACAINE-EPINEPHRINE 0.5% -1:200000 IJ SOLN
INTRAMUSCULAR | Status: DC | PRN
  Administered 2013-05-10: 6 mL

## 2013-05-10 MED ORDER — MIDAZOLAM HCL 2 MG/2ML IJ SOLN: 1.0000 mg | INTRAMUSCULAR | Status: DC | PRN

## 2013-05-10 MED ORDER — HYDROCODONE-ACETAMINOPHEN 5-325 MG PO TABS: 1.0000 | ORAL_TABLET | Freq: Four times a day (QID) | ORAL | Status: DC | PRN

## 2013-05-10 MED ORDER — ONDANSETRON HCL 4 MG/2ML IJ SOLN: 4.0000 mg | Freq: Once | INTRAMUSCULAR | Status: DC | PRN

## 2013-05-10 MED ORDER — LACTATED RINGERS IV SOLN
INTRAVENOUS | Status: DC
  Administered 2013-05-10: 14:00:00 via INTRAVENOUS

## 2013-05-10 MED ORDER — EPHEDRINE SULFATE 50 MG/ML IJ SOLN
INTRAMUSCULAR | Status: DC | PRN
  Administered 2013-05-10 (×2): 10 mg via INTRAVENOUS

## 2013-05-10 MED ORDER — FENTANYL CITRATE 0.05 MG/ML IJ SOLN
INTRAMUSCULAR | Status: DC | PRN
  Administered 2013-05-10: 100 ug via INTRAVENOUS

## 2013-05-10 MED ORDER — PHENYLEPHRINE HCL 10 MG/ML IJ SOLN
10.0000 mg | INTRAVENOUS | Status: DC | PRN
  Administered 2013-05-10: 40 ug/min via INTRAVENOUS

## 2013-05-10 MED ORDER — VANCOMYCIN HCL IN DEXTROSE 1-5 GM/200ML-% IV SOLN
1000.0000 mg | INTRAVENOUS | Status: AC
  Administered 2013-05-10 (×2): 1000 mg via INTRAVENOUS

## 2013-05-10 MED ORDER — LIDOCAINE HCL (CARDIAC) 20 MG/ML IV SOLN
INTRAVENOUS | Status: DC | PRN
  Administered 2013-05-10: 50 mg via INTRAVENOUS

## 2013-05-10 MED ORDER — ONDANSETRON HCL 4 MG/2ML IJ SOLN
INTRAMUSCULAR | Status: DC | PRN
  Administered 2013-05-10: 4 mg via INTRAVENOUS

## 2013-05-10 MED ORDER — LACTATED RINGERS IV SOLN: INTRAVENOUS | Status: DC

## 2013-05-10 MED ORDER — FENTANYL CITRATE 0.05 MG/ML IJ SOLN: 50.0000 ug | INTRAMUSCULAR | Status: DC | PRN

## 2013-05-10 MED ORDER — PROPOFOL 10 MG/ML IV BOLUS
INTRAVENOUS | Status: DC | PRN
  Administered 2013-05-10: 150 mg via INTRAVENOUS

## 2013-05-10 MED ORDER — FENTANYL CITRATE 0.05 MG/ML IJ SOLN: 25.0000 ug | INTRAMUSCULAR | Status: DC | PRN

## 2013-05-10 SURGICAL SUPPLY — 67 items
BANDAGE ELASTIC 3 VELCRO ST LF (GAUZE/BANDAGES/DRESSINGS) IMPLANT
BANDAGE ELASTIC 4 VELCRO ST LF (GAUZE/BANDAGES/DRESSINGS) ×2 IMPLANT
BANDAGE ELASTIC 6 VELCRO ST LF (GAUZE/BANDAGES/DRESSINGS) IMPLANT
BLADE SURG 15 STRL LF DISP TIS (BLADE) ×1 IMPLANT
BLADE SURG 15 STRL SS (BLADE) ×1
BNDG COHESIVE 4X5 TAN STRL (GAUZE/BANDAGES/DRESSINGS) IMPLANT
BNDG ESMARK 4X9 LF (GAUZE/BANDAGES/DRESSINGS) ×2 IMPLANT
CANISTER SUCTION 1200CC (MISCELLANEOUS) IMPLANT
COVER MAYO STAND STRL (DRAPES) ×2 IMPLANT
COVER TABLE BACK 60X90 (DRAPES) ×2 IMPLANT
CUFF TOURNIQUET SINGLE 18IN (TOURNIQUET CUFF) ×2 IMPLANT
CUFF TOURNIQUET SINGLE 24IN (TOURNIQUET CUFF) IMPLANT
CUFF TOURNIQUET SINGLE 34IN LL (TOURNIQUET CUFF) IMPLANT
DECANTER SPIKE VIAL GLASS SM (MISCELLANEOUS) IMPLANT
DRAIN PENROSE 1/4X12 LTX STRL (WOUND CARE) IMPLANT
DRAPE EXTREMITY T 121X128X90 (DRAPE) ×2 IMPLANT
DRAPE OEC MINIVIEW 54X84 (DRAPES) IMPLANT
DRSG EMULSION OIL 3X3 NADH (GAUZE/BANDAGES/DRESSINGS) IMPLANT
DURAPREP 26ML APPLICATOR (WOUND CARE) ×2 IMPLANT
ELECT REM PT RETURN 9FT ADLT (ELECTROSURGICAL) ×2
ELECTRODE REM PT RTRN 9FT ADLT (ELECTROSURGICAL) ×1 IMPLANT
GAUZE SPONGE 4X4 16PLY XRAY LF (GAUZE/BANDAGES/DRESSINGS) IMPLANT
GAUZE XEROFORM 1X8 LF (GAUZE/BANDAGES/DRESSINGS) ×2 IMPLANT
GLOVE BIO SURGEON STRL SZ 6.5 (GLOVE) ×4 IMPLANT
GLOVE BIO SURGEON STRL SZ8.5 (GLOVE) ×2 IMPLANT
GLOVE BIOGEL PI IND STRL 7.0 (GLOVE) ×1 IMPLANT
GLOVE BIOGEL PI IND STRL 8 (GLOVE) ×1 IMPLANT
GLOVE BIOGEL PI IND STRL 8.5 (GLOVE) ×1 IMPLANT
GLOVE BIOGEL PI INDICATOR 7.0 (GLOVE) ×1
GLOVE BIOGEL PI INDICATOR 8 (GLOVE) ×1
GLOVE BIOGEL PI INDICATOR 8.5 (GLOVE) ×1
GLOVE SS BIOGEL STRL SZ 8 (GLOVE) ×1 IMPLANT
GLOVE SUPERSENSE BIOGEL SZ 8 (GLOVE) ×1
GOWN PREVENTION PLUS XLARGE (GOWN DISPOSABLE) ×4 IMPLANT
LOOP VESSEL MAXI BLUE (MISCELLANEOUS) IMPLANT
NEEDLE HYPO 25X1 1.5 SAFETY (NEEDLE) ×2 IMPLANT
NS IRRIG 1000ML POUR BTL (IV SOLUTION) ×2 IMPLANT
PACK BASIN DAY SURGERY FS (CUSTOM PROCEDURE TRAY) ×2 IMPLANT
PAD CAST 3X4 CTTN HI CHSV (CAST SUPPLIES) IMPLANT
PAD CAST 4YDX4 CTTN HI CHSV (CAST SUPPLIES) IMPLANT
PADDING CAST ABS 4INX4YD NS (CAST SUPPLIES) ×1
PADDING CAST ABS COTTON 4X4 ST (CAST SUPPLIES) ×1 IMPLANT
PADDING CAST COTTON 3X4 STRL (CAST SUPPLIES)
PADDING CAST COTTON 4X4 STRL (CAST SUPPLIES)
PENCIL BUTTON HOLSTER BLD 10FT (ELECTRODE) ×2 IMPLANT
SHEET MEDIUM DRAPE 40X70 STRL (DRAPES) IMPLANT
SLEEVE SCD COMPRESS KNEE MED (MISCELLANEOUS) IMPLANT
SPLINT PLASTER CAST XFAST 3X15 (CAST SUPPLIES) IMPLANT
SPLINT PLASTER XTRA FASTSET 3X (CAST SUPPLIES)
SPONGE GAUZE 4X4 12PLY (GAUZE/BANDAGES/DRESSINGS) ×2 IMPLANT
STOCKINETTE 4X48 STRL (DRAPES) ×2 IMPLANT
STOCKINETTE IMPERVIOUS LG (DRAPES) IMPLANT
STRIP CLOSURE SKIN 1/2X4 (GAUZE/BANDAGES/DRESSINGS) IMPLANT
SUCTION FRAZIER TIP 10 FR DISP (SUCTIONS) IMPLANT
SUT ETHILON 4 0 PS 2 18 (SUTURE) ×2 IMPLANT
SUT PROLENE 3 0 PS 1 (SUTURE) IMPLANT
SUT PROLENE 4 0 PS 2 18 (SUTURE) IMPLANT
SUT VIC AB 0 CT1 27 (SUTURE)
SUT VIC AB 0 CT1 27XBRD ANBCTR (SUTURE) IMPLANT
SUT VIC AB 2-0 SH 27 (SUTURE)
SUT VIC AB 2-0 SH 27XBRD (SUTURE) IMPLANT
SYR BULB 3OZ (MISCELLANEOUS) ×2 IMPLANT
SYR CONTROL 10ML LL (SYRINGE) ×2 IMPLANT
TOWEL OR 17X24 6PK STRL BLUE (TOWEL DISPOSABLE) ×4 IMPLANT
TOWEL OR NON WOVEN STRL DISP B (DISPOSABLE) ×2 IMPLANT
TUBE CONNECTING 20X1/4 (TUBING) IMPLANT
UNDERPAD 30X30 INCONTINENT (UNDERPADS AND DIAPERS) ×2 IMPLANT

## 2013-05-10 NOTE — Transfer of Care (Signed)
Immediate Anesthesia Transfer of Care Note  Patient: Arthur [name]  Procedure(s) Performed: Procedure(s): RIGHT SECOND TOE AMPUTATION (Right)  Patient Location: PACU  Anesthesia Type:General  Level of Consciousness: sedated  Airway & Oxygen Therapy: Patient Spontanous Breathing and Patient connected to face mask oxygen  Post-op Assessment: Report given to PACU RN and Post -op Vital signs reviewed and stable  Post vital signs: Reviewed and stable  Complications: No apparent anesthesia complications

## 2013-05-10 NOTE — Anesthesia Preprocedure Evaluation (Signed)
Anesthesia Evaluation  Patient identified by MRN, date of birth, ID band Patient awake    Reviewed: Allergy & Precautions, H&P , NPO status , Patient's Chart, lab work & pertinent test results  Airway Mallampati: I TM Distance: >3 FB Neck ROM: Full    Dental  (+) Teeth Intact and Dental Advisory Given   Pulmonary  breath sounds clear to auscultation        Cardiovascular hypertension, Pt. on medications + CAD and + Past MI + dysrhythmias Rhythm:Regular Rate:Normal     Neuro/Psych    GI/Hepatic   Endo/Other    Renal/GU      Musculoskeletal   Abdominal   Peds  Hematology   Anesthesia Other Findings PAD noted with poor circulation in legs, R>L.  Reproductive/Obstetrics                           Anesthesia Physical Anesthesia Plan  ASA: III  Anesthesia Plan: General   Post-op Pain Management:    Induction: Intravenous  Airway Management Planned: LMA  Additional Equipment:   Intra-op Plan:   Post-operative Plan: Extubation in OR  Informed Consent: I have reviewed the patients History and Physical, chart, labs and discussed the procedure including the risks, benefits and alternatives for the proposed anesthesia with the patient or authorized representative who has indicated his/her understanding and acceptance.   Dental advisory given  Plan Discussed with: CRNA, Anesthesiologist and Surgeon  Anesthesia Plan Comments:         Anesthesia Quick Evaluation

## 2013-05-10 NOTE — Anesthesia Procedure Notes (Signed)
Procedure Name: LMA Insertion Date/Time: 05/10/2013 1:56 PM Performed by: Zenia Resides D Pre-anesthesia Checklist: Patient identified, Emergency Drugs available, Suction available and Patient being monitored Patient Re-evaluated:Patient Re-evaluated prior to inductionOxygen Delivery Method: Circle System Utilized Preoxygenation: Pre-oxygenation with 100% oxygen Intubation Type: IV induction Ventilation: Mask ventilation without difficulty LMA: LMA inserted LMA Size: 4.0 Number of attempts: 1 Airway Equipment and Method: bite block Placement Confirmation: positive ETCO2 Tube secured with: Tape Dental Injury: Teeth and Oropharynx as per pre-operative assessment

## 2013-05-10 NOTE — Telephone Encounter (Signed)
ok 

## 2013-05-10 NOTE — Op Note (Signed)
#  [id number] 

## 2013-05-10 NOTE — Interval H&P Note (Signed)
History and Physical Interval Note:  05/10/2013 1:22 PM  Arthur [name]  has presented today for surgery, with the diagnosis of RIGHT SECOND TOE OSTEOMYLITIS  The various methods of treatment have been discussed with the patient and family. After consideration of risks, benefits and other options for treatment, the patient has consented to  Procedure(s): RIGHT SECOND TOE AMPUTATION (Right) as a surgical intervention .  The patient's history has been reviewed, patient examined, no change in status, stable for surgery.  I have reviewed the patient's chart and labs.  Questions were answered to the patient's satisfaction.     Nasha Diss G

## 2013-05-11 ENCOUNTER — Encounter (HOSPITAL_BASED_OUTPATIENT_CLINIC_OR_DEPARTMENT_OTHER): Payer: Self-pay | Admitting: Orthopaedic Surgery

## 2013-05-11 NOTE — Op Note (Signed)
NAMEKAEDAN, RICHERT NO.:  [id number]  MEDICAL RECORD NO.:  [id number]  LOCATION:                               FACILITY:  [facility]  PHYSICIAN:  Lubertha Basque. Yony Roulston, M.D.DATE OF BIRTH:  [birth year]  DATE OF PROCEDURE:  05/10/2013 DATE OF DISCHARGE:  05/10/2013                              OPERATIVE REPORT   PREOPERATIVE DIAGNOSIS:  Right second toe osteomyelitis.  POSTOPERATIVE DIAGNOSIS:  Right second toe osteomyelitis.  PROCEDURE:  Right second toe amputation.  ANESTHESIA:  General.  ATTENDING SURGEON:  Lubertha Basque. Jerl Santos, MD  ASSISTANT:  Lindwood Qua, PA  INDICATION FOR PROCEDURE:  The patient is an 79 year old man with a long history of a draining wound on the right toe.  He has developed some osteomyelitis which has not been responsive to treatments at the [facility] Center including IV antibiotics.  He is offered a simple second toe amputation at this point.  Informed operative consent was obtained after discussion of possible complications including reaction to anesthesia and wound healing difficulty.  SUMMARY OF FINDINGS AND PROCEDURE:  Under general anesthesia with a circumferential incision at the base of the toe, a second toe amputation was performed at the MTP joint.  He was closed primarily and discharged home.  DESCRIPTION OF PROCEDURE:  The patient was taken to the operating suite where general anesthetic was applied without difficulty.  He was positioned supine and prepped and draped in normal sterile fashion. After the administration of IV antibiotic and an appropriate time out, the right leg was elevated, exsanguinated, tourniquet inflated about the calf.  I made a circumferential incision near the base of the second toe with dissection down to the MTP joint.  The toe was disarticulated at this joint.  The wound was thoroughly irrigated with saline.  The tourniquet was deflated and small amount of bleeding was easily controlled with some  pressure.  I then reapproximated skin with vertical mattresses of nylon loosely.  Skin edges bled well and we injected with some Marcaine.  Adaptic was applied followed by dry gauze and loose Ace wrap.  We also did trim his other nails at the same time.  Estimated blood loss and fluids can be obtained from anesthesia records as can accurate tourniquet time.  DISPOSITION:  The patient was extubated in the operating room and taken to recovery room in stable addition.  He was to go home same-day and follow up in the office closely.  I will contact him by phone tonight.     Lubertha Basque Jerl Santos, M.D.     PGD/MEDQ  D:  05/10/2013  T:  05/10/2013  Job:  [id number]

## 2013-05-11 NOTE — Anesthesia Postprocedure Evaluation (Signed)
  Anesthesia Post-op Note  Patient: Arthur [name]  Procedure(s) Performed: Procedure(s): RIGHT SECOND TOE AMPUTATION (Right)  Patient Location: PACU  Anesthesia Type:General  Level of Consciousness: awake  Airway and Oxygen Therapy: Patient Spontanous Breathing  Post-op Pain: mild  Post-op Assessment: Post-op Vital signs reviewed  Post-op Vital Signs: Reviewed  Complications: No apparent anesthesia complications

## 2013-05-16 ENCOUNTER — Ambulatory Visit: Payer: Medicare Other | Admitting: Internal Medicine

## 2013-05-16 ENCOUNTER — Encounter: Payer: Self-pay | Admitting: Internal Medicine

## 2013-05-16 ENCOUNTER — Telehealth: Payer: Self-pay | Admitting: Internal Medicine

## 2013-05-16 ENCOUNTER — Ambulatory Visit (INDEPENDENT_AMBULATORY_CARE_PROVIDER_SITE_OTHER): Payer: Medicare Other | Admitting: Cardiology

## 2013-05-16 DIAGNOSIS — I4891 Unspecified atrial fibrillation: Secondary | ICD-10-CM

## 2013-05-16 NOTE — Patient Instructions (Signed)
Anticoagulation medication compliance and risk for missed doses

## 2013-05-16 NOTE — Telephone Encounter (Signed)
I have left another message in response to the patient's voicemail.

## 2013-05-16 NOTE — Telephone Encounter (Signed)
Left message for patient to call back  

## 2013-05-16 NOTE — Telephone Encounter (Signed)
Patient would like to come in and discuss stopping his prednisone.  He is scheduled for 05/25/13 10:00 with Dr. Marina Goodell

## 2013-05-23 ENCOUNTER — Other Ambulatory Visit: Payer: Self-pay | Admitting: *Deleted

## 2013-05-23 MED ORDER — METOPROLOL TARTRATE 25 MG PO TABS: 25.0000 mg | ORAL_TABLET | Freq: Every day | ORAL | Status: DC

## 2013-05-23 NOTE — Telephone Encounter (Signed)
Pt called wanting office to send him a 30 days tablet to local Pharmacy while he waits for his Mail order pharmacy to send him his Metoprolol Tart. Verified with pt the sig of his Lopressor. Fax Received. Refill Completed. Jaymee Tilson Chowoe (R.M.A)

## 2013-05-24 ENCOUNTER — Ambulatory Visit (INDEPENDENT_AMBULATORY_CARE_PROVIDER_SITE_OTHER): Payer: Medicare Other | Admitting: *Deleted

## 2013-05-24 DIAGNOSIS — I4891 Unspecified atrial fibrillation: Secondary | ICD-10-CM

## 2013-05-24 LAB — POCT INR: INR: 1.6

## 2013-05-25 ENCOUNTER — Ambulatory Visit: Payer: Medicare Other | Admitting: Internal Medicine

## 2013-05-30 ENCOUNTER — Telehealth: Payer: Self-pay | Admitting: *Deleted

## 2013-05-30 NOTE — Telephone Encounter (Signed)
S/w pt, left a note for Norma Fredrickson with weight history, Lawson Fiscal stated weight looked ok, I asked if swelling is better, pt stated a little better, pt stated should pt keep taking Lasixs, I stated no change in medication at this time

## 2013-05-31 ENCOUNTER — Encounter: Payer: Self-pay | Admitting: Internal Medicine

## 2013-05-31 ENCOUNTER — Ambulatory Visit (INDEPENDENT_AMBULATORY_CARE_PROVIDER_SITE_OTHER): Payer: Medicare Other | Admitting: Internal Medicine

## 2013-05-31 ENCOUNTER — Other Ambulatory Visit (INDEPENDENT_AMBULATORY_CARE_PROVIDER_SITE_OTHER): Payer: Medicare Other

## 2013-05-31 VITALS — BP 130/66 | HR 83 | Ht 74.0 in | Wt 202.4 lb

## 2013-05-31 DIAGNOSIS — K754 Autoimmune hepatitis: Secondary | ICD-10-CM

## 2013-05-31 DIAGNOSIS — R7989 Other specified abnormal findings of blood chemistry: Secondary | ICD-10-CM

## 2013-05-31 LAB — HEPATIC FUNCTION PANEL
ALT: 123 U/L — ABNORMAL HIGH (ref 0–53)
AST: 104 U/L — ABNORMAL HIGH (ref 0–37)
Alkaline Phosphatase: 72 U/L (ref 39–117)
Bilirubin, Direct: 0.2 mg/dL (ref 0.0–0.3)
Total Bilirubin: 0.9 mg/dL (ref 0.3–1.2)

## 2013-05-31 NOTE — Patient Instructions (Addendum)
Your physician has requested that you go to the basement for the following lab work before leaving today:  Liver function tests  Continue taking Prednisone until you hear from our office

## 2013-05-31 NOTE — Progress Notes (Signed)
HISTORY OF PRESENT ILLNESS:  Arthur [name] is a 79 y.o. male with multiple significant medical problems who was initially evaluated 01/12/2013 regarding elevated hepatic transaminases. As previously outlined. He was felt to have autoimmune hepatitis and did not have a liver biopsy. He was initiated on prednisone 40 mg with marked improvement in liver studies. He was last seen in the office 03/15/2013 after returning from [place]. At that time he decreased his prednisone to 10 mg daily. Repeat liver tests a few weeks later were mildly abnormal. He is currently on prednisone 5 mg daily. He was to followup at this time. He has not had liver tests in over one month. He is interested in getting off prednisone. He did have amputation of a toe on the right foot. He is recovering as expected. No interval problems. He was placed on furosemide for ankle edema. This has helped his edema, though he exhibits nocturia with associated sleep interruption  REVIEW OF SYSTEMS:  All non-GI ROS negative except for mild fatigue and sleeping problems  Past Medical History  Diagnosis Date  . Coronary artery disease     MI, atherectomy 1993  . Hyperlipidemia   . Arrhythmia     1995  . Atrial fibrillation   . History of transient ischemic attack (TIA) 2005    double vision  . Hypertension   . History of gallstones   . 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 Eczema   . Elevated LFTs   . Skin cancer   . Hiatal hernia   . DDD (degenerative disc disease)     Past Surgical History  Procedure Laterality Date  . Cardiac catheterization  04/08/99    LAD 70% INTER 40-50% RCA 30-40%  . Appendectomy    . Coronary angioplasty  04/08/99    LAD  . Coronary angioplasty with stent placement  04/08/99    LAD  . Coronary angioplasty  10/17/97    RCA   . Melanoma excision  2013    lt arm  . Tonsillectomy    . Amputation Right 05/10/2013    Procedure: RIGHT SECOND TOE AMPUTATION;  Surgeon: Velna Ochs, MD;   Location: [facility];  Service: Orthopedics;  Laterality: Right;    Social History Arthur [name]  reports that he quit smoking about 45 years ago. His smoking use included Cigarettes and Cigars. He smoked 0.00 packs per day. He has never used smokeless tobacco. He reports that he drinks about 5.0 ounces of alcohol per week. He reports that he does not use illicit drugs.  family history includes Heart attack (age of onset: 33) in his mother and Pneumonia in his father.  Allergies  Allergen Reactions  . Penicillins Hives       PHYSICAL EXAMINATION: Vital signs: BP 130/66  Pulse 83  Ht 6\' 2"  (1.88 m)  Wt 202 lb 6.4 oz (91.808 kg)  BMI 25.98 kg/m2  SpO2 97% General: Well-developed, well-nourished, no acute distress HEENT: Sclerae are anicteric, conjunctiva pink. Oral mucosa intact Lungs: Clear Heart: Regular Abdomen: soft, nontender, nondistended, no obvious ascites, no peritoneal signs, normal bowel sounds. No organomegaly. Extremities:  1-2+ ankle edema Psychiatric: alert and oriented x3. Cooperative   ASSESSMENT:  #1. Autoimmune hepatitis, presumptive. Possibly secondary, statin induced. Clinically doing well. Currently on 5 mg of prednisone. On chronic Coumadin for other reasons   PLAN:  #1. Recheck liver tests today. Adjustments in prednisone and interval for rechecking liver tests be determined after the  results are available.

## 2013-06-01 ENCOUNTER — Other Ambulatory Visit: Payer: Self-pay

## 2013-06-01 ENCOUNTER — Telehealth: Payer: Self-pay | Admitting: Internal Medicine

## 2013-06-01 NOTE — Telephone Encounter (Signed)
Dr. Marina Goodell the patient is asking to speak directly with you.  I did speak with him and reviewed the results and the increase in prednisone.

## 2013-06-03 ENCOUNTER — Telehealth: Payer: Self-pay | Admitting: Internal Medicine

## 2013-06-06 NOTE — Telephone Encounter (Signed)
Left message for pt to call back  °

## 2013-06-07 ENCOUNTER — Ambulatory Visit (INDEPENDENT_AMBULATORY_CARE_PROVIDER_SITE_OTHER): Payer: Medicare Other | Admitting: *Deleted

## 2013-06-07 DIAGNOSIS — I4891 Unspecified atrial fibrillation: Secondary | ICD-10-CM

## 2013-06-07 NOTE — Telephone Encounter (Signed)
I spoke with him. Answered questions.

## 2013-06-07 NOTE — Telephone Encounter (Signed)
Left message for pt to call back  °

## 2013-06-08 ENCOUNTER — Other Ambulatory Visit: Payer: Self-pay | Admitting: Internal Medicine

## 2013-06-08 ENCOUNTER — Other Ambulatory Visit (INDEPENDENT_AMBULATORY_CARE_PROVIDER_SITE_OTHER): Payer: Medicare Other

## 2013-06-08 ENCOUNTER — Telehealth: Payer: Self-pay | Admitting: Internal Medicine

## 2013-06-08 DIAGNOSIS — R7989 Other specified abnormal findings of blood chemistry: Secondary | ICD-10-CM

## 2013-06-08 LAB — HEPATIC FUNCTION PANEL
ALT: 103 U/L — ABNORMAL HIGH (ref 0–53)
AST: 70 U/L — ABNORMAL HIGH (ref 0–37)
Albumin: 3.1 g/dL — ABNORMAL LOW (ref 3.5–5.2)
Alkaline Phosphatase: 77 U/L (ref 39–117)
Total Protein: 6.4 g/dL (ref 6.0–8.3)

## 2013-06-08 MED ORDER — PREDNISONE 10 MG PO TABS: 5.0000 mg | ORAL_TABLET | Freq: Every day | ORAL | Status: DC

## 2013-06-08 NOTE — Telephone Encounter (Signed)
Refilled Prednisone

## 2013-06-08 NOTE — Telephone Encounter (Signed)
Attempted to reach pt again. Had to leave another message for pt to call back. Have been unable to reach pt after multiple attempts.

## 2013-06-17 ENCOUNTER — Telehealth: Payer: Self-pay | Admitting: Internal Medicine

## 2013-06-17 NOTE — Telephone Encounter (Signed)
Left message for pt to call back  °

## 2013-06-20 NOTE — Telephone Encounter (Signed)
Pt states he is having about 2 loose diarrhea stools/day. Discussed with pt that diarrhea is not a usual side effect of prednisone. Instructed pt to take Imodium for the diarrhea but he states it really is not bothering him so he will hold on the Imodium for now.

## 2013-06-21 ENCOUNTER — Ambulatory Visit (INDEPENDENT_AMBULATORY_CARE_PROVIDER_SITE_OTHER): Payer: Medicare Other | Admitting: *Deleted

## 2013-06-21 DIAGNOSIS — I4891 Unspecified atrial fibrillation: Secondary | ICD-10-CM

## 2013-06-21 LAB — POCT INR: INR: 2.7

## 2013-06-22 ENCOUNTER — Other Ambulatory Visit: Payer: Self-pay | Admitting: Internal Medicine

## 2013-06-22 ENCOUNTER — Other Ambulatory Visit (INDEPENDENT_AMBULATORY_CARE_PROVIDER_SITE_OTHER): Payer: Medicare Other

## 2013-06-22 DIAGNOSIS — R7989 Other specified abnormal findings of blood chemistry: Secondary | ICD-10-CM

## 2013-06-22 LAB — HEPATIC FUNCTION PANEL
Albumin: 3.2 g/dL — ABNORMAL LOW (ref 3.5–5.2)
Alkaline Phosphatase: 66 U/L (ref 39–117)
Total Protein: 6.2 g/dL (ref 6.0–8.3)

## 2013-06-23 ENCOUNTER — Other Ambulatory Visit: Payer: Self-pay | Admitting: Internal Medicine

## 2013-06-27 ENCOUNTER — Telehealth: Payer: Self-pay | Admitting: Internal Medicine

## 2013-06-27 NOTE — Telephone Encounter (Signed)
Left message for pt to call back  °

## 2013-06-28 NOTE — Telephone Encounter (Signed)
Left message for pt to call back.  Pt wanted to know when he is supposed to have labs. Reviewed with pt that he is due for labs 07/06/13, orders in epic.

## 2013-07-06 ENCOUNTER — Other Ambulatory Visit (INDEPENDENT_AMBULATORY_CARE_PROVIDER_SITE_OTHER): Payer: Medicare Other

## 2013-07-06 ENCOUNTER — Telehealth: Payer: Self-pay | Admitting: Internal Medicine

## 2013-07-06 ENCOUNTER — Other Ambulatory Visit: Payer: Self-pay | Admitting: Internal Medicine

## 2013-07-06 DIAGNOSIS — K754 Autoimmune hepatitis: Secondary | ICD-10-CM

## 2013-07-06 DIAGNOSIS — R7989 Other specified abnormal findings of blood chemistry: Secondary | ICD-10-CM

## 2013-07-06 LAB — HEPATIC FUNCTION PANEL
Albumin: 3.3 g/dL — ABNORMAL LOW (ref 3.5–5.2)
Alkaline Phosphatase: 61 U/L (ref 39–117)
Bilirubin, Direct: 0.1 mg/dL (ref 0.0–0.3)
Total Bilirubin: 0.9 mg/dL (ref 0.3–1.2)

## 2013-07-06 NOTE — Telephone Encounter (Signed)
Pt aware of results. See result note.

## 2013-07-13 ENCOUNTER — Telehealth: Payer: Self-pay | Admitting: Internal Medicine

## 2013-07-15 MED ORDER — PREDNISONE 10 MG PO TABS: 5.0000 mg | ORAL_TABLET | Freq: Every day | ORAL | Status: DC

## 2013-07-15 NOTE — Telephone Encounter (Signed)
Refilled Prednisone

## 2013-07-20 ENCOUNTER — Ambulatory Visit (INDEPENDENT_AMBULATORY_CARE_PROVIDER_SITE_OTHER): Payer: Medicare Other | Admitting: Pharmacist

## 2013-07-20 DIAGNOSIS — I4891 Unspecified atrial fibrillation: Secondary | ICD-10-CM

## 2013-07-21 ENCOUNTER — Telehealth: Payer: Self-pay | Admitting: Oncology

## 2013-07-21 ENCOUNTER — Other Ambulatory Visit (INDEPENDENT_AMBULATORY_CARE_PROVIDER_SITE_OTHER): Payer: Medicare Other

## 2013-07-21 DIAGNOSIS — K754 Autoimmune hepatitis: Secondary | ICD-10-CM

## 2013-07-21 LAB — HEPATIC FUNCTION PANEL
AST: 40 U/L — ABNORMAL HIGH (ref 0–37)
Alkaline Phosphatase: 55 U/L (ref 39–117)
Bilirubin, Direct: 0.1 mg/dL (ref 0.0–0.3)
Total Protein: 6.1 g/dL (ref 6.0–8.3)

## 2013-07-21 NOTE — Telephone Encounter (Signed)
LVOM FOR PT TO RETURN CALL IN RE TO REFERRAL.  °

## 2013-07-21 NOTE — Telephone Encounter (Signed)
C/D 07/21/13 for appt. 07/22/13

## 2013-07-21 NOTE — Telephone Encounter (Signed)
PT CALLED TO SCHEDULE NP APPT 09/05 @ 1:30 W/DR. SHADAD REFERRING DR. DREW JONES DX- [employer] PACKET MAILED.

## 2013-07-22 ENCOUNTER — Encounter: Payer: Self-pay | Admitting: Oncology

## 2013-07-22 ENCOUNTER — Other Ambulatory Visit (HOSPITAL_BASED_OUTPATIENT_CLINIC_OR_DEPARTMENT_OTHER): Payer: Medicare Other | Admitting: Lab

## 2013-07-22 ENCOUNTER — Ambulatory Visit: Payer: Medicare Other

## 2013-07-22 ENCOUNTER — Ambulatory Visit (HOSPITAL_BASED_OUTPATIENT_CLINIC_OR_DEPARTMENT_OTHER): Payer: Medicare Other | Admitting: Oncology

## 2013-07-22 ENCOUNTER — Telehealth: Payer: Self-pay | Admitting: Oncology

## 2013-07-22 ENCOUNTER — Other Ambulatory Visit: Payer: Self-pay | Admitting: Internal Medicine

## 2013-07-22 ENCOUNTER — Other Ambulatory Visit: Payer: Self-pay | Admitting: Oncology

## 2013-07-22 VITALS — BP 138/64 | HR 63 | Temp 97.9°F | Resp 18 | Ht 74.0 in | Wt 206.1 lb

## 2013-07-22 DIAGNOSIS — C439 Malignant melanoma of skin, unspecified: Secondary | ICD-10-CM

## 2013-07-22 LAB — CBC WITH DIFFERENTIAL/PLATELET
Basophils Absolute: 0 10*3/uL (ref 0.0–0.1)
EOS%: 0.3 % (ref 0.0–7.0)
HGB: 14.2 g/dL (ref 13.0–17.1)
MCH: 33.1 pg (ref 27.2–33.4)
MCHC: 33 g/dL (ref 32.0–36.0)
MCV: 100 fL — ABNORMAL HIGH (ref 79.3–98.0)
MONO%: 12 % (ref 0.0–14.0)
NEUT%: 62.3 % (ref 39.0–75.0)
RDW: 14.4 % (ref 11.0–14.6)

## 2013-07-22 LAB — COMPREHENSIVE METABOLIC PANEL (CC13)
AST: 39 U/L — ABNORMAL HIGH (ref 5–34)
Alkaline Phosphatase: 63 U/L (ref 40–150)
BUN: 23.6 mg/dL (ref 7.0–26.0)
Creatinine: 1.4 mg/dL — ABNORMAL HIGH (ref 0.7–1.3)
Potassium: 5.5 mEq/L — ABNORMAL HIGH (ref 3.5–5.1)

## 2013-07-22 NOTE — Telephone Encounter (Signed)
Gave pt appt for MD visit on 11/11/14for MD and PEt scan on 9/11 , Ok per MD

## 2013-07-22 NOTE — Progress Notes (Signed)
Reason for Referral: Melanoma.   HPI: This is a pleasant 79 year old gentleman with past medical history significant for coronary artery disease and hyperlipidemia as well as multiple skin cancers. He follows up with Dr. Yetta Barre from [facility] for routine examinations. In 2012 he was diagnosed with malignant melanoma from a skin lesion that was noted on his left forearm. At that time, he was measured to be 1.75 mm with a Clark's level IV. No ulcerations or vascular invasion. This was qualified as T2 b. lesion in May of 2012, he underwent wide excision and sentinel lymph node biopsy done by Dr. Daphine Deutscher which showed no lymph node involvement and no residual malignant melanoma. Since that time, he has been receiving active surveillance and developed another recurrence on his left elbow in the punch biopsy showed recurrence of his melanoma. He also had a skin lesion on his scalp that was basal cell carcinoma, nodular pattern. Patient is set up to see Dr. Daphine Deutscher from Gen. surgery and was referred to me for evaluation for systemic disease. He also been diagnosed with autoimmune hepatitis and have been treated with prednisone with Dr. Marina Goodell from gastroenterology. He is currently maintained on 15 mg of prednisone. He also has atrial fibrillation and currently on Coumadin.  Clinically, he is overall asymptomatic. He still in rather good health and shape. Continue to exercise regularly including treadmill and by the core exercises daily. He had not reported any chest pain or shortness of breath. Has not reported any weight loss or decline in his energy of performance status. Had not reported any headaches other neurological changes. Has not reported any cough or hemoptysis. Has not reported any change in his bowel habits. Had not reported any genitourinary complaints. He had not reported any lymphadenopathy or change in his activity level.  Past Medical History  Diagnosis Date  . Coronary artery  disease     MI, atherectomy 1993  . Hyperlipidemia   . Arrhythmia     1995  . Atrial fibrillation   . History of transient ischemic attack (TIA) 2005    double vision  . Hypertension   . History of gallstones   . 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 Eczema   . Elevated LFTs   . Skin cancer   . Hiatal hernia   . DDD (degenerative disc disease)   :  Past Surgical History  Procedure Laterality Date  . Cardiac catheterization  04/08/99    LAD 70% INTER 40-50% RCA 30-40%  . Appendectomy    . Coronary angioplasty  04/08/99    LAD  . Coronary angioplasty with stent placement  04/08/99    LAD  . Coronary angioplasty  10/17/97    RCA   . Melanoma excision  2013    lt arm  . Tonsillectomy    . Amputation Right 05/10/2013    Procedure: RIGHT SECOND TOE AMPUTATION;  Surgeon: Velna Ochs, MD;  Location: [facility];  Service: Orthopedics;  Laterality: Right;  :  Current Outpatient Prescriptions  Medication Sig Dispense Refill  . furosemide (LASIX) 20 MG tablet Take 20 mg by mouth.      . metoprolol tartrate (LOPRESSOR) 25 MG tablet Take 1 tablet (25 mg total) by mouth daily.  30 tablet  0  . predniSONE (STERAPRED UNI-PAK) 5 MG TABS tablet Take 15 mg by mouth daily.      Marland Kitchen warfarin (COUMADIN) 5 MG tablet TAKE 1 TABLET AS DIRECTED  90 tablet  1   No current facility-administered medications for this visit.     Allergies  Allergen Reactions  . Penicillins Hives  :  Family History  Problem Relation Age of Onset  . Heart attack Mother 30    MI  . Pneumonia Father   :  History   Social History  . Marital Status: Married    Spouse Name: N/A    Number of Children: N/A  . Years of Education: N/A   Occupational History  . Not on file.   Social History Main Topics  . Smoking status: Former Smoker    Types: Cigarettes, Cigars    Quit date: 11/18/1967  . Smokeless tobacco: Never Used     Comment: 1-2 cigars a day  . Alcohol Use: 5.0 oz/week     10 drink(s) per week     Comment: 2-3 glasses of wine in the evening  . Drug Use: No  . Sexual Activity: Not Currently   Other Topics Concern  . Not on file   Social History Narrative  . No narrative on file  :  Pertinent items are noted in HPI.  Exam: ECOG 0 Blood pressure 138/64, pulse 63, temperature 97.9 F (36.6 C), temperature source Oral, resp. rate 18, height 6\' 2"  (1.88 m), weight 206 lb 1.6 oz (93.486 kg), SpO2 98.00%. General appearance: alert and cooperative Head: Normocephalic, without obvious abnormality, atraumatic Eyes: conjunctivae/corneas clear. PERRL, EOM's intact. Fundi benign. Throat: normal findings: lips normal without lesions Neck: no adenopathy, no carotid bruit, no JVD, supple, symmetrical, trachea midline and thyroid not enlarged, symmetric, no tenderness/mass/nodules Resp: clear to auscultation bilaterally Cardio: regular rate and rhythm, S1, S2 normal, no murmur, click, rub or gallop GI: soft, non-tender; bowel sounds normal; no masses,  no organomegaly Extremities: edema 1+. Skin: Skin color, texture, turgor normal. No rashes or lesions Lymph nodes: Cervical, supraclavicular, and axillary nodes normal.   Recent Labs  07/22/13 1354  WBC 9.3  HGB 14.2  HCT 42.9  PLT 158       Assessment and Plan:   79 year old gentleman with the following issues:  1. Malignant melanoma initially diagnosed in 2012 and found to have a T2b lesion with the depth of invasion of 1.75 mm and a Clark's level IV. No ulcerations or vascular invasion was noted and was treated with a wide excision and a sentinel lymph node biopsy that was negative. Most recently, he had a punch biopsy of a lesion in his left elbow which showed deposits of atypical melanocytes arranging sheets which are positive for recurrence of his malignant melanoma. I had a lengthy discussion today with the patient discussing the natural course of malignant melanoma in the pattern of spread and  recurrence of this particular disease. Noe Gens to have a propensity for developing skin lesions and cancers. It is not surprising anemia he developed a recurrence and possibly this could be a new primary. But also this could be a recurrence of his already resected melanoma although he had an excellent wide excision and sentinel lymph node biopsy.  Our management standpoint, I agree with an evaluation with Dr. Daphine Deutscher for possible reexcision and lymph node sampling to make sure he does not have any local local regional recurrence. I will also obtain a PET CT scan to rule out distant metastasis at this time. Unless he has a distant metastasis that is biopsy proven, I favor resection with another wide excision of that particular area to insure local control.  All his questions were  answered today and L2 EKG results of the PET CT scan as soon as I get them.  2. Followup: Pending on the finding of the PET CT scan. He does not have any metastatic disease I will see him back in 2 months following his surgery. He does have any suspicious lesions and we will set him up for a biopsy and then we will talk about treating stage IV melanoma at that time.

## 2013-07-22 NOTE — Progress Notes (Signed)
Checked in new pt with no financial concerns. °

## 2013-07-27 ENCOUNTER — Ambulatory Visit (INDEPENDENT_AMBULATORY_CARE_PROVIDER_SITE_OTHER): Payer: Medicare Other | Admitting: Surgery

## 2013-07-27 ENCOUNTER — Encounter (INDEPENDENT_AMBULATORY_CARE_PROVIDER_SITE_OTHER): Payer: Self-pay | Admitting: Surgery

## 2013-07-27 VITALS — BP 122/72 | HR 68 | Temp 98.0°F | Resp 14 | Ht 74.0 in | Wt 206.0 lb

## 2013-07-27 DIAGNOSIS — C439 Malignant melanoma of skin, unspecified: Secondary | ICD-10-CM | POA: Insufficient documentation

## 2013-07-27 NOTE — Progress Notes (Signed)
Chief Complaint:  Recurrent melanoma left forearm  History of Present Illness:  Arthur [name] is an 79 y.o. male on whom I did a excision of a melanoma 2 years ago of the forearm with a sentinel lymph node biopsy. Sentinel node was negative and margins were clean. Adjacent to that was a new area that Dr. Karlyn Agee biopsy in an area and it turned out there were some sheets of melanoma cells in there. I do not have that written report says this is: By way of Dr. Blima Ledger note.  He is scheduled for a PET scan on Friday and he was to go and have this reexcision next week if possible. I discussed stopping his Coumadin 5 days in advance. If the PET scan shows something when he backed on sooner or differently what might either move his surgery day.  Past Medical History  Diagnosis Date  . Coronary artery disease     MI, atherectomy 1993  . Hyperlipidemia   . Arrhythmia     1995  . Atrial fibrillation   . History of transient ischemic attack (TIA) 2005    double vision  . Hypertension   . History of gallstones   . 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 Eczema   . Elevated LFTs   . Skin cancer   . Hiatal hernia   . DDD (degenerative disc disease)     Past Surgical History  Procedure Laterality Date  . Cardiac catheterization  04/08/99    LAD 70% INTER 40-50% RCA 30-40%  . Appendectomy    . Coronary angioplasty  04/08/99    LAD  . Coronary angioplasty with stent placement  04/08/99    LAD  . Coronary angioplasty  10/17/97    RCA   . Melanoma excision  2013    lt arm  . Tonsillectomy    . Amputation Right 05/10/2013    Procedure: RIGHT SECOND TOE AMPUTATION;  Surgeon: Velna Ochs, MD;  Location: [facility];  Service: Orthopedics;  Laterality: Right;    Current Outpatient Prescriptions  Medication Sig Dispense Refill  . metoprolol tartrate (LOPRESSOR) 25 MG tablet Take 1 tablet (25 mg total) by mouth daily.  30 tablet  0  . predniSONE (STERAPRED UNI-PAK) 5 MG  TABS tablet Take 15 mg by mouth daily.      Marland Kitchen warfarin (COUMADIN) 5 MG tablet TAKE 1 TABLET AS DIRECTED  90 tablet  1   No current facility-administered medications for this visit.   Penicillins Family History  Problem Relation Age of Onset  . Heart attack Mother 73    MI  . Pneumonia Father    Social History:   reports that he quit smoking about 45 years ago. His smoking use included Cigarettes and Cigars. He smoked 0.00 packs per day. He has never used smokeless tobacco. He reports that he drinks about 5.0 ounces of alcohol per week. He reports that he does not use illicit drugs.   REVIEW OF SYSTEMS - PERTINENT POSITIVES ONLY: noncontributory  Physical Exam:   Blood pressure 122/72, pulse 68, temperature 98 F (36.7 C), temperature source Temporal, resp. rate 14, height 6\' 2"  (1.88 m), weight 206 lb (93.441 kg). Body mass index is 26.44 kg/(m^2).  Gen:  WDWN white male NAD  Neurological: Alert and oriented to person, place, and time. Motor and sensory function is grossly intact  Head: Normocephalic and atraumatic.  Eyes: Conjunctivae are normal. Pupils are equal, round, and reactive to  light. No scleral icterus.   Skin:  There is a small incision proximal to the prior excision site on the left forearm.    LABORATORY RESULTS: No results found for this or any previous visit (from the past 48 hour(s)).  RADIOLOGY RESULTS: No results found.  Problem List: Patient Active Problem List   Diagnosis Date Noted  . Osteomyelitis of toe of right foot 04/20/2013  . Current use of long term anticoagulation 03/04/2013  . SKIN CANCER, HX OF 11/05/2010  . PSA, INCREASED 10/18/2009  . C A D 10/16/2008  . ECZEMA 10/16/2008  . TRANSIENT ISCHEMIC ATTACK, HX OF 10/16/2008  . LOW BACK PAIN, MILD 05/17/2008  . HYPERLIPIDEMIA 09/02/2007  . HYPERTENSION 09/02/2007  . MYOCARDIAL INFARCTION, HX OF 09/02/2007  . ATRIAL FIBRILLATION 09/02/2007    Assessment & Plan: Recurrent melanoma.   Sounds like a satellite lesion.  PET scan pending.  Will get scheduled for re excision.      Matt B. Daphine Deutscher, MD, [facility] [phone] beeper [phone]  07/27/2013 10:30 AM

## 2013-07-27 NOTE — Patient Instructions (Signed)
Positron Emission Tomography (PET Scan) PET stands for positron emission tomography. This is a test similar to an X-ray. Pictures can be taken of a body part after injection of a very small dose of a chemical called a radionuclide. This is combined with sugar, water, or ammonia to give off tiny particles called positrons. The positrons emitted are like small bursts of energy that can be detected by a scanner. They are processed by a computer to create images. These images can be used to study different diseases. They are often used to study cancer and cancer therapy. A scan of the entire body can be done and used to study all its parts. Because this test is tagged to a sugar used by cells, the bursts of energy show up differently in cells that use sugar faster. The computer is able to produce a color-coded picture based on this. The colors and amount of brightness on a PET image show different levels of tissue or organ function. For example, a cancer grows faster than healthy tissue and uses more sugar than normal tissue. It will absorb more of the substance injected. This causes it to appear brighter than normal tissue on the PET image. A specialist will read and explain the images. Other examinations, such as recent CT (or CAT) scans or MRI scans may help with interpretation and should be brought along. There are usually no restrictions after the test. You should drink plenty of fluids to flush the radioactive substance from your body. BEFORE THE PROCEDURE   PET is usually an outpatient procedure. Wear comfortable, loose-fitting clothes.  Do not eat for four hours before the scan. You will be encouraged to drink water.  Your caregiver will instruct you regarding the use of medications before the test.  Note: Diabetic patients should ask for any specific diet guidelines to control glucose (sugar) levels during the day of the test. There are limitations with the test if your blood sugar is not controlled  during or before the test.  Be on time because of the rapid decay of the radioactive material that must be injected. PROCEDURE  Before the procedure begins a small amount of harmless radioactive material will be injected into a vein. This means you will have a needle stick. It will take from 30 minutes to one hour for the material to travel around your body in preparation for the scan. You will lie on a cushioned table and be moved through the center of a machine that looks like a large doughnut. This is the machine that detects the positrons. It is connected to a computer that produces images that can be viewed on a monitor. This will take about 30 minutes to an hour, during which you must remain still. Let your caregiver know if this will be difficult for you. Also, let your caregiver know if you need a sedative or help dealing with claustrophobia (feeling uncomfortable in enclosed spaces). HOME CARE INSTRUCTIONS   For the protection of your privacy, test results can not be given over the phone. Make sure you receive the results of your test. Ask as to how these results are to be obtained if you have not been informed. It is your responsibility to obtain your test results.  Drink several 8-once glasses of water following the test to flush the small amount of radioactive material out of your body.  Keep your follow-up appointments. Document Released: 05/10/2003 Document Revised: 01/26/2012 Document Reviewed: 11/03/2005 Exit[employer] Patient Information 2014 [employer].  

## 2013-07-28 ENCOUNTER — Telehealth: Payer: Self-pay | Admitting: *Deleted

## 2013-07-28 ENCOUNTER — Telehealth (INDEPENDENT_AMBULATORY_CARE_PROVIDER_SITE_OTHER): Payer: Self-pay

## 2013-07-28 NOTE — Telephone Encounter (Signed)
Gave patient results of recent lab work and he requested i send them to dr Baldo Ash fax [phone]. done

## 2013-07-28 NOTE — Telephone Encounter (Signed)
The pt wants to know since his blood work is normal does he have to take the pet scan tomorrow.  Please call [phone]

## 2013-07-29 ENCOUNTER — Encounter (HOSPITAL_COMMUNITY)
Admission: RE | Admit: 2013-07-29 | Discharge: 2013-07-29 | Disposition: A | Discharge status: Home / Self Care | Payer: Medicare Other | Source: Ambulatory Visit | Attending: Oncology | Admitting: Oncology

## 2013-07-29 ENCOUNTER — Telehealth: Payer: Self-pay | Admitting: *Deleted

## 2013-07-29 ENCOUNTER — Encounter (HOSPITAL_COMMUNITY): Payer: Self-pay | Admitting: Pharmacy Technician

## 2013-07-29 DIAGNOSIS — C439 Malignant melanoma of skin, unspecified: Secondary | ICD-10-CM | POA: Insufficient documentation

## 2013-07-29 LAB — GLUCOSE, CAPILLARY: Glucose-Capillary: 86 mg/dL (ref 70–99)

## 2013-07-29 MED ORDER — FLUDEOXYGLUCOSE F - 18 (FDG) INJECTION
15.0000 | Freq: Once | INTRAVENOUS | Status: AC | PRN
  Administered 2013-07-29: 15 via INTRAVENOUS

## 2013-07-29 NOTE — Telephone Encounter (Signed)
Spoke with patient, gave results of scan done yesterday

## 2013-07-29 NOTE — Telephone Encounter (Signed)
Returned pt's call. Let him know that yes, he should still have the pet scan done today.

## 2013-07-29 NOTE — Telephone Encounter (Signed)
Message copied by Reesa Chew on Fri Jul 29, 2013  4:57 PM ------      Message from: Benjiman Core      Created: Fri Jul 29, 2013  4:28 PM       Please call him the let him know that his PET scan showed no cancer. ------

## 2013-07-29 NOTE — Telephone Encounter (Signed)
No note

## 2013-08-01 ENCOUNTER — Telehealth (INDEPENDENT_AMBULATORY_CARE_PROVIDER_SITE_OTHER): Payer: Self-pay

## 2013-08-01 NOTE — Telephone Encounter (Signed)
Patient states his Pet scan was negative and he needs the pre op appointment to be cancelled and he is going out of town today. He is asking for Dr. Daphine Deutscher to call ASAP.I expressed  Dr. Daphine Deutscher is in the OR I would forward the Message

## 2013-08-02 ENCOUNTER — Inpatient Hospital Stay (HOSPITAL_COMMUNITY): Admission: RE | Admit: 2013-08-02 | Payer: Medicare Other | Source: Ambulatory Visit

## 2013-08-03 ENCOUNTER — Encounter (INDEPENDENT_AMBULATORY_CARE_PROVIDER_SITE_OTHER): Payer: Self-pay

## 2013-08-04 ENCOUNTER — Other Ambulatory Visit (INDEPENDENT_AMBULATORY_CARE_PROVIDER_SITE_OTHER): Payer: Medicare Other

## 2013-08-04 DIAGNOSIS — R7989 Other specified abnormal findings of blood chemistry: Secondary | ICD-10-CM

## 2013-08-04 LAB — HEPATIC FUNCTION PANEL
AST: 41 U/L — ABNORMAL HIGH (ref 0–37)
Albumin: 3.3 g/dL — ABNORMAL LOW (ref 3.5–5.2)
Total Bilirubin: 0.8 mg/dL (ref 0.3–1.2)

## 2013-08-05 ENCOUNTER — Other Ambulatory Visit: Payer: Self-pay | Admitting: Internal Medicine

## 2013-08-08 ENCOUNTER — Encounter (INDEPENDENT_AMBULATORY_CARE_PROVIDER_SITE_OTHER): Payer: Self-pay

## 2013-08-08 ENCOUNTER — Telehealth: Payer: Self-pay | Admitting: Internal Medicine

## 2013-08-08 MED ORDER — PREDNISONE 10 MG PO TABS: 10.0000 mg | ORAL_TABLET | Freq: Every day | ORAL | Status: DC

## 2013-08-10 ENCOUNTER — Encounter (HOSPITAL_COMMUNITY): Admission: RE | Payer: Self-pay | Source: Ambulatory Visit

## 2013-08-10 ENCOUNTER — Encounter (HOSPITAL_COMMUNITY): Payer: Self-pay | Admitting: Pharmacy Technician

## 2013-08-10 ENCOUNTER — Ambulatory Visit (HOSPITAL_COMMUNITY): Admission: RE | Admit: 2013-08-10 | Payer: Medicare Other | Source: Ambulatory Visit | Admitting: Surgery

## 2013-08-10 SURGERY — EXCISION, MELANOMA
Anesthesia: General | Laterality: Left

## 2013-08-15 NOTE — Patient Instructions (Addendum)
22 Arthur [name]  08/15/2013   Your procedure is scheduled on: 08/23/13  Report to [facility] at 7:30 AM.  Call this number if you have problems the morning of surgery 336-: [phone]   Remember: please complete fleets enema the night before surgery   Do not eat food or drink liquids After Midnight.     Take these medicines the morning of surgery with A SIP OF WATER: metoprolol tartrate, prednisone   Do not wear jewelry, make-up or nail polish.  Do not wear lotions, powders, or perfumes. You may wear deodorant.  Do not shave 48 hours prior to surgery. Men may shave face and neck.  Do not bring valuables to the hospital.    Patients discharged the day of surgery will not be allowed to drive home.  Name and phone number of your driver: Lanora Manis [phone]   Birdie Sons, RN  pre op nurse call if needed [phone]    FAILURE TO FOLLOW THESE INSTRUCTIONS MAY RESULT IN CANCELLATION OF YOUR SURGERY   Patient Signature: ___________________________________________

## 2013-08-15 NOTE — Progress Notes (Addendum)
EKG 10/08/12 on EPIC, PET scan 07/29/13 on EPIC

## 2013-08-16 ENCOUNTER — Telehealth (INDEPENDENT_AMBULATORY_CARE_PROVIDER_SITE_OTHER): Payer: Self-pay | Admitting: *Deleted

## 2013-08-16 ENCOUNTER — Encounter (HOSPITAL_COMMUNITY): Payer: Self-pay

## 2013-08-16 ENCOUNTER — Encounter (HOSPITAL_COMMUNITY)
Admission: RE | Admit: 2013-08-16 | Discharge: 2013-08-16 | Disposition: A | Discharge status: Home / Self Care | Payer: Medicare Other | Source: Ambulatory Visit | Attending: Surgery | Admitting: Surgery

## 2013-08-16 DIAGNOSIS — Z01818 Encounter for other preprocedural examination: Secondary | ICD-10-CM | POA: Insufficient documentation

## 2013-08-16 DIAGNOSIS — Z01812 Encounter for preprocedural laboratory examination: Secondary | ICD-10-CM | POA: Insufficient documentation

## 2013-08-16 HISTORY — DX: Pneumonia, unspecified organism: J18.9

## 2013-08-16 LAB — CBC
HCT: 43.7 % (ref 39.0–52.0)
Platelets: 153 10*3/uL (ref 150–400)
RBC: 4.43 MIL/uL (ref 4.22–5.81)
RDW: 14.2 % (ref 11.5–15.5)
WBC: 7.4 10*3/uL (ref 4.0–10.5)

## 2013-08-16 LAB — BASIC METABOLIC PANEL
Chloride: 101 mEq/L (ref 96–112)
GFR calc Af Amer: 50 mL/min — ABNORMAL LOW (ref >90)
GFR calc non Af Amer: 43 mL/min — ABNORMAL LOW (ref >90)
Glucose, Bld: 150 mg/dL — ABNORMAL HIGH (ref 70–99)
Potassium: 4.9 mEq/L (ref 3.5–5.1)
Sodium: 137 mEq/L (ref 135–145)

## 2013-08-16 LAB — PROTIME-INR: INR: 1.99 — ABNORMAL HIGH (ref 0.00–1.49)

## 2013-08-16 NOTE — Telephone Encounter (Signed)
Fleet Contras with Pre-surgical called regarding patient's Coumadin and when it should be stopped.  Dr. Daphine Deutscher has order placed on 07/27/13 that states for Coumadin to be stopped 5 days prior to surgery.  Fleet Contras updated with this information at this time.

## 2013-08-17 ENCOUNTER — Telehealth (INDEPENDENT_AMBULATORY_CARE_PROVIDER_SITE_OTHER): Payer: Self-pay

## 2013-08-17 NOTE — Telephone Encounter (Signed)
Pt called stating he was given order sheet from pre admit that indicated and enema the pm prior to surgery. I reviewed with Meagen and per request pt was advised he did not need to have enema.

## 2013-08-19 ENCOUNTER — Encounter (HOSPITAL_COMMUNITY): Payer: Self-pay | Admitting: Family Medicine

## 2013-08-19 ENCOUNTER — Emergency Department (HOSPITAL_COMMUNITY)
Admission: EM | Admit: 2013-08-19 | Discharge: 2013-08-19 | Disposition: A | Discharge status: Home / Self Care | Payer: Medicare Other | Attending: Emergency Medicine | Admitting: Emergency Medicine

## 2013-08-19 DIAGNOSIS — IMO0002 Reserved for concepts with insufficient information to code with codable children: Secondary | ICD-10-CM | POA: Insufficient documentation

## 2013-08-19 DIAGNOSIS — Z8679 Personal history of other diseases of the circulatory system: Secondary | ICD-10-CM | POA: Insufficient documentation

## 2013-08-19 DIAGNOSIS — Y929 Unspecified place or not applicable: Secondary | ICD-10-CM | POA: Insufficient documentation

## 2013-08-19 DIAGNOSIS — Y93E8 Activity, other personal hygiene: Secondary | ICD-10-CM | POA: Insufficient documentation

## 2013-08-19 DIAGNOSIS — Z8719 Personal history of other diseases of the digestive system: Secondary | ICD-10-CM | POA: Insufficient documentation

## 2013-08-19 DIAGNOSIS — Z87891 Personal history of nicotine dependence: Secondary | ICD-10-CM | POA: Insufficient documentation

## 2013-08-19 DIAGNOSIS — Z8739 Personal history of other diseases of the musculoskeletal system and connective tissue: Secondary | ICD-10-CM | POA: Insufficient documentation

## 2013-08-19 DIAGNOSIS — Z79899 Other long term (current) drug therapy: Secondary | ICD-10-CM | POA: Insufficient documentation

## 2013-08-19 DIAGNOSIS — Z7901 Long term (current) use of anticoagulants: Secondary | ICD-10-CM | POA: Insufficient documentation

## 2013-08-19 DIAGNOSIS — I252 Old myocardial infarction: Secondary | ICD-10-CM | POA: Insufficient documentation

## 2013-08-19 DIAGNOSIS — I2510 Atherosclerotic heart disease of native coronary artery without angina pectoris: Secondary | ICD-10-CM | POA: Insufficient documentation

## 2013-08-19 DIAGNOSIS — Z8673 Personal history of transient ischemic attack (TIA), and cerebral infarction without residual deficits: Secondary | ICD-10-CM | POA: Insufficient documentation

## 2013-08-19 DIAGNOSIS — Z8701 Personal history of pneumonia (recurrent): Secondary | ICD-10-CM | POA: Insufficient documentation

## 2013-08-19 DIAGNOSIS — W278XXA Contact with other nonpowered hand tool, initial encounter: Secondary | ICD-10-CM | POA: Insufficient documentation

## 2013-08-19 DIAGNOSIS — Z85828 Personal history of other malignant neoplasm of skin: Secondary | ICD-10-CM | POA: Insufficient documentation

## 2013-08-19 DIAGNOSIS — E785 Hyperlipidemia, unspecified: Secondary | ICD-10-CM | POA: Insufficient documentation

## 2013-08-19 DIAGNOSIS — Z872 Personal history of diseases of the skin and subcutaneous tissue: Secondary | ICD-10-CM | POA: Insufficient documentation

## 2013-08-19 DIAGNOSIS — I10 Essential (primary) hypertension: Secondary | ICD-10-CM | POA: Insufficient documentation

## 2013-08-19 NOTE — ED Provider Notes (Signed)
CSN: [id number]     Arrival date & time 08/19/13  1403 History   First MD Initiated Contact with Patient 08/19/13 1514     Chief Complaint  Patient presents with  . Toe Injury   (Consider location/radiation/quality/duration/timing/severity/associated sxs/prior Treatment) HPI Comments: Arthur [name] is a 79 y.o. Male who injured his left second toe when he was clipping his nails 10 days ago. Since that time. He has noticed yellow drainage from the toe and is concerned that it is not healing. He had to have a colon lesion on the right about one month ago after a prolonged wound that never healed. He denies fever, chills, nausea, vomiting, weakness, or dizziness. The wound has not been bleeding. There are no other known modifying factors.  The history is provided by the patient.    Past Medical History  Diagnosis Date  . Coronary artery disease     MI, atherectomy 1993  . Hyperlipidemia   . Arrhythmia     1995  . Atrial fibrillation   . History of transient ischemic attack (TIA) 2005    double vision  . Hypertension   . History of gallstones   . 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 Eczema   . Elevated LFTs   . Skin cancer   . DDD (degenerative disc disease)   . Pneumonia     long time ago   Past Surgical History  Procedure Laterality Date  . Cardiac catheterization  04/08/99    LAD 70% INTER 40-50% RCA 30-40%  . Appendectomy    . Coronary angioplasty  04/08/99    LAD  . Coronary angioplasty with stent placement  04/08/99    LAD  . Coronary angioplasty  10/17/97    RCA   . Melanoma excision  2013    lt arm  . Tonsillectomy    . Amputation Right 05/10/2013    Procedure: RIGHT SECOND TOE AMPUTATION;  Surgeon: Velna Ochs, MD;  Location: [facility];  Service: Orthopedics;  Laterality: Right;   Family History  Problem Relation Age of Onset  . Heart attack Mother 46    MI  . Pneumonia Father    History  Substance Use Topics  . Smoking status:  Former Smoker    Types: Cigarettes, Cigars    Quit date: 11/18/1967  . Smokeless tobacco: Never Used     Comment: 1-2 cigars a day  . Alcohol Use: 0.0 oz/week     Comment: 2-3 glasses of wine in the evening    Review of Systems  All other systems reviewed and are negative.    Allergies  Penicillins  Home Medications   Current Outpatient Rx  Name  Route  Sig  Dispense  Refill  . Glycerin-Hypromellose-PEG 400 ([facility] DRY EYE [facility]IE[facility] OP)   Both Eyes   Place 3 drops into both eyes daily as needed (dry eye).         . metoprolol tartrate (LOPRESSOR) 25 MG tablet   Oral   Take 25 mg by mouth every morning.         . predniSONE (DELTASONE) 10 MG tablet   Oral   Take 1 tablet (10 mg total) by mouth daily.   30 tablet   1     Changed to 10mg  daily per Dr. Lamar Sprinkles lab note dat ...   . warfarin (COUMADIN) 5 MG tablet   Oral   Take 2.5-5 mg by mouth daily. 5 mg tablet on Monday,  Wednesday and Friday all other days is 2.5 mg          BP 122/73  Pulse 82  Temp(Src) 98.3 F (36.8 C)  Resp 18  SpO2 98% Physical Exam  Nursing note and vitals reviewed. Constitutional: He is oriented to person, place, and time. He appears well-developed and well-nourished.  HENT:  Head: Normocephalic and atraumatic.  Right Ear: External ear normal.  Left Ear: External ear normal.  Eyes: Conjunctivae and EOM are normal. Pupils are equal, round, and reactive to light.  Neck: Normal range of motion and phonation normal. Neck supple.  Cardiovascular: Normal rate.   Pulmonary/Chest: Effort normal. He exhibits no bony tenderness.  Abdominal: Normal appearance.  Musculoskeletal: Normal range of motion.  Left foot- hammertoe great toe. Second digit has a superficial abrasion, that appears to be near full thickness in the center, measuring 5x10 mm. There is no drainage, bleeding, swelling, or proximal streaking. The remainder of the second toe appears normal.  Neurological: He is alert and  oriented to person, place, and time. No cranial nerve deficit or sensory deficit. He exhibits normal muscle tone. Coordination normal.  Skin: Skin is warm, dry and intact.  Psychiatric: He has a normal mood and affect. His behavior is normal. Judgment and thought content normal.    ED Course  Procedures (including critical care time)    MDM   1. Abrasion, toe without infection    Toe abrasion that is nearly completely healed. It was likely near full thickness and is therefore taking some time to improve. There is no sign of infection, vascular compromise, fracture, bone involvement, or systemic symptoms. He is stable for discharge with outpatient management.  Nursing Notes Reviewed/ Care Coordinated, and agree without changes. Applicable Imaging Reviewed.  Interpretation of Laboratory Data incorporated into ED treatment   Plan: Home Medications- usual; Home Treatments and Observation- daily soaks until improved; return here if the recommended treatment, does not improve the symptoms; Recommended follow up- PCP, when necessary      Flint Melter, MD 08/19/13 1609

## 2013-08-19 NOTE — ED Notes (Signed)
Pt presents with redness to L 3rd toe.  Pt reports cutting toenail x 1 week ago, clipping top of toe with nail.  Area is to top of toe, small amount of redness noted with scant amount of yellow drainage noted on bandaid.  Pt denies any pain, reports walking daily on treadmill "that probably aggravated it".  Pt reports amputation of R 2nd toe x 2 months ago.

## 2013-08-19 NOTE — ED Notes (Signed)
Pt comfortable with d/c and f/u instructions. No prescriptions 

## 2013-08-19 NOTE — ED Notes (Signed)
Per pt sts cut toe on left foot 1 week ago and now possibly infected. sts slightly red and oozing.

## 2013-08-22 ENCOUNTER — Other Ambulatory Visit (INDEPENDENT_AMBULATORY_CARE_PROVIDER_SITE_OTHER): Payer: Medicare Other

## 2013-08-22 DIAGNOSIS — R7989 Other specified abnormal findings of blood chemistry: Secondary | ICD-10-CM

## 2013-08-22 LAB — HEPATIC FUNCTION PANEL
Bilirubin, Direct: 0.2 mg/dL (ref 0.0–0.3)
Total Bilirubin: 1.1 mg/dL (ref 0.3–1.2)

## 2013-08-22 NOTE — Telephone Encounter (Signed)
ERROR

## 2013-08-23 ENCOUNTER — Ambulatory Visit (HOSPITAL_COMMUNITY)
Admission: RE | Admit: 2013-08-23 | Discharge: 2013-08-23 | Disposition: A | Discharge status: Home / Self Care | Payer: Medicare Other | Source: Ambulatory Visit | Attending: Surgery | Admitting: Surgery

## 2013-08-23 ENCOUNTER — Other Ambulatory Visit: Payer: Self-pay

## 2013-08-23 ENCOUNTER — Encounter (HOSPITAL_COMMUNITY): Payer: Self-pay | Admitting: *Deleted

## 2013-08-23 ENCOUNTER — Encounter (HOSPITAL_COMMUNITY)
Admission: RE | Disposition: A | Discharge status: Home / Self Care | Payer: Self-pay | Source: Ambulatory Visit | Attending: Surgery

## 2013-08-23 ENCOUNTER — Ambulatory Visit (HOSPITAL_COMMUNITY): Payer: Medicare Other | Admitting: Anesthesiology

## 2013-08-23 ENCOUNTER — Encounter (HOSPITAL_COMMUNITY): Payer: Self-pay | Admitting: Anesthesiology

## 2013-08-23 DIAGNOSIS — Z85820 Personal history of malignant melanoma of skin: Secondary | ICD-10-CM

## 2013-08-23 DIAGNOSIS — H532 Diplopia: Secondary | ICD-10-CM | POA: Insufficient documentation

## 2013-08-23 DIAGNOSIS — C50919 Malignant neoplasm of unspecified site of unspecified female breast: Secondary | ICD-10-CM

## 2013-08-23 DIAGNOSIS — I10 Essential (primary) hypertension: Secondary | ICD-10-CM | POA: Insufficient documentation

## 2013-08-23 DIAGNOSIS — Z7901 Long term (current) use of anticoagulants: Secondary | ICD-10-CM | POA: Insufficient documentation

## 2013-08-23 DIAGNOSIS — K754 Autoimmune hepatitis: Secondary | ICD-10-CM

## 2013-08-23 DIAGNOSIS — I252 Old myocardial infarction: Secondary | ICD-10-CM | POA: Insufficient documentation

## 2013-08-23 DIAGNOSIS — E785 Hyperlipidemia, unspecified: Secondary | ICD-10-CM | POA: Insufficient documentation

## 2013-08-23 DIAGNOSIS — H539 Unspecified visual disturbance: Secondary | ICD-10-CM | POA: Insufficient documentation

## 2013-08-23 DIAGNOSIS — C4360 Malignant melanoma of unspecified upper limb, including shoulder: Secondary | ICD-10-CM | POA: Insufficient documentation

## 2013-08-23 DIAGNOSIS — I2510 Atherosclerotic heart disease of native coronary artery without angina pectoris: Secondary | ICD-10-CM | POA: Insufficient documentation

## 2013-08-23 DIAGNOSIS — C779 Secondary and unspecified malignant neoplasm of lymph node, unspecified: Secondary | ICD-10-CM

## 2013-08-23 DIAGNOSIS — I69998 Other sequelae following unspecified cerebrovascular disease: Secondary | ICD-10-CM | POA: Insufficient documentation

## 2013-08-23 HISTORY — PX: MELANOMA EXCISION: SHX5266

## 2013-08-23 LAB — PROTIME-INR
INR: 1.15 (ref 0.00–1.49)
Prothrombin Time: 14.5 seconds (ref 11.6–15.2)

## 2013-08-23 SURGERY — EXCISION, MELANOMA
Anesthesia: Monitor Anesthesia Care | Site: Arm Lower | Laterality: Left | Wound class: Clean

## 2013-08-23 MED ORDER — FENTANYL CITRATE 0.05 MG/ML IJ SOLN
INTRAMUSCULAR | Status: DC | PRN
  Administered 2013-08-23: 50 ug via INTRAVENOUS

## 2013-08-23 MED ORDER — PROPOFOL 10 MG/ML IV BOLUS
INTRAVENOUS | Status: DC | PRN
  Administered 2013-08-23: 200 mg via INTRAVENOUS

## 2013-08-23 MED ORDER — OXYCODONE HCL 5 MG PO TABS: 5.0000 mg | ORAL_TABLET | Freq: Once | ORAL | Status: DC | PRN

## 2013-08-23 MED ORDER — LIDOCAINE HCL (PF) 2 % IJ SOLN
INTRAMUSCULAR | Status: DC | PRN
  Administered 2013-08-23: 75 mg

## 2013-08-23 MED ORDER — OXYCODONE HCL 5 MG PO TABS: 5.0000 mg | ORAL_TABLET | ORAL | Status: DC | PRN

## 2013-08-23 MED ORDER — SODIUM CHLORIDE 0.9 % IJ SOLN: 3.0000 mL | Freq: Two times a day (BID) | INTRAMUSCULAR | Status: DC

## 2013-08-23 MED ORDER — ONDANSETRON HCL 4 MG/2ML IJ SOLN
INTRAMUSCULAR | Status: DC | PRN
  Administered 2013-08-23: 4 mg via INTRAMUSCULAR

## 2013-08-23 MED ORDER — MEPERIDINE HCL 50 MG/ML IJ SOLN: 6.2500 mg | INTRAMUSCULAR | Status: DC | PRN

## 2013-08-23 MED ORDER — BUPIVACAINE-EPINEPHRINE PF 0.25-1:200000 % IJ SOLN
INTRAMUSCULAR | Status: AC
  Filled 2013-08-23: qty 30

## 2013-08-23 MED ORDER — HYDROMORPHONE HCL PF 1 MG/ML IJ SOLN: 0.2500 mg | INTRAMUSCULAR | Status: DC | PRN

## 2013-08-23 MED ORDER — CEFAZOLIN SODIUM-DEXTROSE 2-3 GM-% IV SOLR
INTRAVENOUS | Status: AC
  Filled 2013-08-23: qty 50

## 2013-08-23 MED ORDER — HYDROCODONE-ACETAMINOPHEN 5-325 MG PO TABS: 1.0000 | ORAL_TABLET | ORAL | Status: DC | PRN

## 2013-08-23 MED ORDER — SODIUM CHLORIDE 0.9 % IJ SOLN: 3.0000 mL | INTRAMUSCULAR | Status: DC | PRN

## 2013-08-23 MED ORDER — CEFAZOLIN SODIUM-DEXTROSE 2-3 GM-% IV SOLR
2.0000 g | INTRAVENOUS | Status: AC
  Administered 2013-08-23: 2 g via INTRAVENOUS

## 2013-08-23 MED ORDER — BUPIVACAINE-EPINEPHRINE PF 0.25-1:200000 % IJ SOLN
INTRAMUSCULAR | Status: DC | PRN
  Administered 2013-08-23: 10 mL

## 2013-08-23 MED ORDER — POTASSIUM CHLORIDE IN NACL 20-0.45 MEQ/L-% IV SOLN
INTRAVENOUS | Status: DC
  Filled 2013-08-23: qty 1000

## 2013-08-23 MED ORDER — SODIUM CHLORIDE 0.9 % IV SOLN: 250.0000 mL | INTRAVENOUS | Status: DC | PRN

## 2013-08-23 MED ORDER — OXYCODONE HCL 5 MG/5ML PO SOLN
5.0000 mg | Freq: Once | ORAL | Status: DC | PRN
  Filled 2013-08-23: qty 5

## 2013-08-23 MED ORDER — PHENYLEPHRINE HCL 10 MG/ML IJ SOLN
INTRAMUSCULAR | Status: DC | PRN
  Administered 2013-08-23 (×5): 80 ug via INTRAVENOUS

## 2013-08-23 MED ORDER — ONDANSETRON HCL 4 MG/2ML IJ SOLN: 4.0000 mg | Freq: Four times a day (QID) | INTRAMUSCULAR | Status: DC | PRN

## 2013-08-23 MED ORDER — HEPARIN SODIUM (PORCINE) 5000 UNIT/ML IJ SOLN
5000.0000 [IU] | Freq: Once | INTRAMUSCULAR | Status: AC
  Administered 2013-08-23: 5000 [IU] via SUBCUTANEOUS
  Filled 2013-08-23: qty 1

## 2013-08-23 MED ORDER — ACETAMINOPHEN 325 MG PO TABS: 650.0000 mg | ORAL_TABLET | ORAL | Status: DC | PRN

## 2013-08-23 MED ORDER — LACTATED RINGERS IV SOLN
INTRAVENOUS | Status: DC
  Administered 2013-08-23: 1000 mL via INTRAVENOUS
  Administered 2013-08-23: 11:00:00 via INTRAVENOUS

## 2013-08-23 MED ORDER — CEFAZOLIN SODIUM 10 G IJ SOLR: 3.0000 g | INTRAMUSCULAR | Status: DC

## 2013-08-23 MED ORDER — PROMETHAZINE HCL 25 MG/ML IJ SOLN: 6.2500 mg | INTRAMUSCULAR | Status: DC | PRN

## 2013-08-23 MED ORDER — ACETAMINOPHEN 650 MG RE SUPP
650.0000 mg | RECTAL | Status: DC | PRN
  Filled 2013-08-23: qty 1

## 2013-08-23 MED ORDER — EPHEDRINE SULFATE 50 MG/ML IJ SOLN
INTRAMUSCULAR | Status: DC | PRN
  Administered 2013-08-23 (×2): 10 mg via INTRAVENOUS

## 2013-08-23 SURGICAL SUPPLY — 34 items
BENZOIN TINCTURE PRP APPL 2/3 (GAUZE/BANDAGES/DRESSINGS) IMPLANT
BLADE HEX COATED 2.75 (ELECTRODE) ×2 IMPLANT
BLADE SURG 15 STRL LF DISP TIS (BLADE) ×1 IMPLANT
BLADE SURG 15 STRL SS (BLADE) ×1
BLADE SURG SZ10 CARB STEEL (BLADE) ×2 IMPLANT
CANISTER SUCTION 2500CC (MISCELLANEOUS) ×2 IMPLANT
CLOTH BEACON ORANGE TIMEOUT ST (SAFETY) IMPLANT
DECANTER SPIKE VIAL GLASS SM (MISCELLANEOUS) ×2 IMPLANT
DRAPE LAPAROTOMY T 102X78X121 (DRAPES) ×2 IMPLANT
DRAPE LAPAROTOMY TRNSV 102X78 (DRAPE) IMPLANT
DRAPE LG THREE QUARTER DISP (DRAPES) ×2 IMPLANT
ELECT REM PT RETURN 9FT ADLT (ELECTROSURGICAL) ×2
ELECTRODE REM PT RTRN 9FT ADLT (ELECTROSURGICAL) ×1 IMPLANT
GLOVE BIOGEL M 8.0 STRL (GLOVE) ×2 IMPLANT
GLOVE BIOGEL PI IND STRL 7.0 (GLOVE) ×3 IMPLANT
GLOVE BIOGEL PI INDICATOR 7.0 (GLOVE) ×3
GOWN PREVENTION PLUS LG XLONG (DISPOSABLE) ×2 IMPLANT
GOWN STRL REIN XL XLG (GOWN DISPOSABLE) ×4 IMPLANT
KIT BASIN OR (CUSTOM PROCEDURE TRAY) ×2 IMPLANT
MARKER SKIN DUAL TIP RULER LAB (MISCELLANEOUS) ×2 IMPLANT
NEEDLE HYPO 25X1 1.5 SAFETY (NEEDLE) ×2 IMPLANT
NS IRRIG 1000ML POUR BTL (IV SOLUTION) ×2 IMPLANT
PACK BASIC VI WITH GOWN DISP (CUSTOM PROCEDURE TRAY) ×2 IMPLANT
PENCIL BUTTON HOLSTER BLD 10FT (ELECTRODE) ×2 IMPLANT
SPONGE GAUZE 4X4 12PLY (GAUZE/BANDAGES/DRESSINGS) ×2 IMPLANT
SPONGE LAP 18X18 X RAY DECT (DISPOSABLE) IMPLANT
SPONGE LAP 4X18 X RAY DECT (DISPOSABLE) ×2 IMPLANT
STAPLER VISISTAT 35W (STAPLE) IMPLANT
SUT ETHILON 2 0 PS N (SUTURE) ×2 IMPLANT
SUT PROLENE 4 0 P 3 18 (SUTURE) ×2 IMPLANT
SUT VIC AB 3-0 SH 18 (SUTURE) ×2 IMPLANT
SUT VIC AB 4-0 SH 18 (SUTURE) ×2 IMPLANT
SYR CONTROL 10ML LL (SYRINGE) ×2 IMPLANT
YANKAUER SUCT BULB TIP 10FT TU (MISCELLANEOUS) IMPLANT

## 2013-08-23 NOTE — Anesthesia Preprocedure Evaluation (Addendum)
Anesthesia Evaluation  Patient identified by MRN, date of birth, ID band Patient awake    Reviewed: Allergy & Precautions, H&P , NPO status , Patient's Chart, lab work & pertinent test results  Airway Mallampati: I TM Distance: >3 FB Neck ROM: Full    Dental  (+) Teeth Intact and Dental Advisory Given   Pulmonary pneumonia -, resolved, former smoker,  breath sounds clear to auscultation        Cardiovascular hypertension, Pt. on medications and Pt. on home beta blockers + CAD, + Past MI, + Cardiac Stents and + Peripheral Vascular Disease + dysrhythmias Rhythm:Regular Rate:Normal  Normal nuclear stress in 2010   Neuro/Psych TIAnegative psych ROS   GI/Hepatic negative GI ROS, Neg liver ROS,   Endo/Other  negative endocrine ROS  Renal/GU negative Renal ROS     Musculoskeletal negative musculoskeletal ROS (+)   Abdominal   Peds  Hematology negative hematology ROS (+)   Anesthesia Other Findings PAD noted with poor circulation in legs, R>L.  Reproductive/Obstetrics negative OB ROS                         Anesthesia Physical  Anesthesia Plan  ASA: III  Anesthesia Plan: General and MAC   Post-op Pain Management:    Induction: Intravenous  Airway Management Planned: LMA  Additional Equipment:   Intra-op Plan:   Post-operative Plan: Extubation in OR  Informed Consent: I have reviewed the patients History and Physical, chart, labs and discussed the procedure including the risks, benefits and alternatives for the proposed anesthesia with the patient or authorized representative who has indicated his/her understanding and acceptance.   Dental advisory given  Plan Discussed with: CRNA  Anesthesia Plan Comments:       Anesthesia Quick Evaluation

## 2013-08-23 NOTE — Preoperative (Signed)
Beta Blockers   Reason not to administer Beta Blockers:Took Metoprolol this am. 

## 2013-08-23 NOTE — Transfer of Care (Signed)
Immediate Anesthesia Transfer of Care Note  Patient: Arthur [name]  Procedure(s) Performed: Procedure(s): MELANOMA EXCISION (Left)  Patient Location: PACU  Anesthesia Type:General  Level of Consciousness: awake, sedated and patient cooperative  Airway & Oxygen Therapy: Patient Spontanous Breathing and Patient connected to face mask oxygen  Post-op Assessment: Report given to PACU RN and Post -op Vital signs reviewed and stable  Post vital signs: Reviewed and stable  Complications: No apparent anesthesia complications

## 2013-08-23 NOTE — H&P (View-Only) (Signed)
Chief Complaint:  Recurrent melanoma left forearm  History of Present Illness:  Arthur [name] is an 79 y.o. male on whom I did a excision of a melanoma 2 years ago of the forearm with a sentinel lymph node biopsy. Sentinel node was negative and margins were clean. Adjacent to that was a new area that Dr. Karlyn Agee biopsy in an area and it turned out there were some sheets of melanoma cells in there. I do not have that written report says this is: By way of Dr. Blima Ledger note.  He is scheduled for a PET scan on Friday and he was to go and have this reexcision next week if possible. I discussed stopping his Coumadin 5 days in advance. If the PET scan shows something when he backed on sooner or differently what might either move his surgery day.  Past Medical History  Diagnosis Date  . Coronary artery disease     MI, atherectomy 1993  . Hyperlipidemia   . Arrhythmia     1995  . Atrial fibrillation   . History of transient ischemic attack (TIA) 2005    double vision  . Hypertension   . History of gallstones   . 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 Eczema   . Elevated LFTs   . Skin cancer   . Hiatal hernia   . DDD (degenerative disc disease)     Past Surgical History  Procedure Laterality Date  . Cardiac catheterization  04/08/99    LAD 70% INTER 40-50% RCA 30-40%  . Appendectomy    . Coronary angioplasty  04/08/99    LAD  . Coronary angioplasty with stent placement  04/08/99    LAD  . Coronary angioplasty  10/17/97    RCA   . Melanoma excision  2013    lt arm  . Tonsillectomy    . Amputation Right 05/10/2013    Procedure: RIGHT SECOND TOE AMPUTATION;  Surgeon: Velna Ochs, MD;  Location: [facility];  Service: Orthopedics;  Laterality: Right;    Current Outpatient Prescriptions  Medication Sig Dispense Refill  . metoprolol tartrate (LOPRESSOR) 25 MG tablet Take 1 tablet (25 mg total) by mouth daily.  30 tablet  0  . predniSONE (STERAPRED UNI-PAK) 5 MG  TABS tablet Take 15 mg by mouth daily.      Marland Kitchen warfarin (COUMADIN) 5 MG tablet TAKE 1 TABLET AS DIRECTED  90 tablet  1   No current facility-administered medications for this visit.   Penicillins Family History  Problem Relation Age of Onset  . Heart attack Mother 55    MI  . Pneumonia Father    Social History:   reports that he quit smoking about 45 years ago. His smoking use included Cigarettes and Cigars. He smoked 0.00 packs per day. He has never used smokeless tobacco. He reports that he drinks about 5.0 ounces of alcohol per week. He reports that he does not use illicit drugs.   REVIEW OF SYSTEMS - PERTINENT POSITIVES ONLY: noncontributory  Physical Exam:   Blood pressure 122/72, pulse 68, temperature 98 F (36.7 C), temperature source Temporal, resp. rate 14, height 6\' 2"  (1.88 m), weight 206 lb (93.441 kg). Body mass index is 26.44 kg/(m^2).  Gen:  WDWN white male NAD  Neurological: Alert and oriented to person, place, and time. Motor and sensory function is grossly intact  Head: Normocephalic and atraumatic.  Eyes: Conjunctivae are normal. Pupils are equal, round, and reactive to  light. No scleral icterus.   Skin:  There is a small incision proximal to the prior excision site on the left forearm.    LABORATORY RESULTS: No results found for this or any previous visit (from the past 48 hour(s)).  RADIOLOGY RESULTS: No results found.  Problem List: Patient Active Problem List   Diagnosis Date Noted  . Osteomyelitis of toe of right foot 04/20/2013  . Current use of long term anticoagulation 03/04/2013  . SKIN CANCER, HX OF 11/05/2010  . PSA, INCREASED 10/18/2009  . C A D 10/16/2008  . ECZEMA 10/16/2008  . TRANSIENT ISCHEMIC ATTACK, HX OF 10/16/2008  . LOW BACK PAIN, MILD 05/17/2008  . HYPERLIPIDEMIA 09/02/2007  . HYPERTENSION 09/02/2007  . MYOCARDIAL INFARCTION, HX OF 09/02/2007  . ATRIAL FIBRILLATION 09/02/2007    Assessment & Plan: Recurrent melanoma.   Sounds like a satellite lesion.  PET scan pending.  Will get scheduled for re excision.      Matt B. Daphine Deutscher, MD, [facility] [phone] beeper [phone]  07/27/2013 10:30 AM

## 2013-08-23 NOTE — Op Note (Signed)
Surgeon: Wenda Low, MD, FACS  Asst:  none  Anes:  general  Procedure: Re-excision of site of melanoma satellite lesion of left forearm  Diagnosis: History of melanoma of the left forearm  Complications: none  EBL:   2 cc  Description of Procedure:  Was taken to room 6 and given general anesthesia. In the holding area he and I had marked the area previously biopsied which revealed a satellite lesion in the left forearm. This was an area adjacent the widest portion of the previous excision and it may have represented a near margin but presented as a satellite lesion that was excised by dermatology.  I described an ellipse to get around this with a margin that would enable me to close this primarily. This was approximately 2 cm wide excision and was not quite 3 times the length. This was taken down to the fascia on the distal margin wall where a suture was placed. On the margin near his the excision site actually undermined the lateral margin and took more tissue down to the fascia. The ellipse was removed and sent for permanent sections. The wound was then closed with interrupted 3-0 Vicryl and with a running 4-0 Prolene. Neosporin ointment and Curlex followed by an Ace wrap were used to dress area. Patient are the procedure well was taken recovery room in satisfactory condition.  Matt B. Daphine Deutscher, MD, [facility], [place] [phone]

## 2013-08-23 NOTE — Anesthesia Postprocedure Evaluation (Signed)
Anesthesia Post Note  Patient: Arthur [name]  Procedure(s) Performed: Procedure(s) (LRB): MELANOMA EXCISION (Left)  Anesthesia type: General  Patient location: PACU  Post pain: Pain level controlled  Post assessment: Post-op Vital signs reviewed  Last Vitals: BP 130/72  Pulse 54  Temp(Src) 36.4 C (Oral)  Resp 16  Ht 6\' 2"  (1.88 m)  Wt 207 lb (93.895 kg)  BMI 26.57 kg/m2  SpO2 95%  Post vital signs: Reviewed  Level of consciousness: sedated  Complications: No apparent anesthesia complications

## 2013-08-23 NOTE — Interval H&P Note (Signed)
History and Physical Interval Note:  08/23/2013 10:03 AM  Arthur [name]  has presented today for surgery, with the diagnosis of melanoma   The various methods of treatment have been discussed with the patient and family. After consideration of risks, benefits and other options for treatment, the patient has consented to  Procedure(s): MELANOMA EXCISION (Left) as a surgical intervention .  The patient's history has been reviewed, patient examined, no change in status, stable for surgery.  I have reviewed the patient's chart and labs.  Questions were answered to the patient's satisfaction.     Tesean Stump B

## 2013-08-24 ENCOUNTER — Telehealth: Payer: Self-pay | Admitting: Cardiovascular Disease

## 2013-08-24 ENCOUNTER — Telehealth: Payer: Self-pay | Admitting: Internal Medicine

## 2013-08-24 ENCOUNTER — Encounter (HOSPITAL_COMMUNITY): Payer: Self-pay | Admitting: Surgery

## 2013-08-24 NOTE — Telephone Encounter (Signed)
I spoke with the patient by telephone this evening. He wants to know if he should be concerned that his liver tests have not normalized. We discussed autoimmune hepatitis in some detail. I told him that his liver test abnormalities were not overwhelming and that his liver is functioning well. He is tolerating low-dose prednisone quite well. His history of skin cancer (recent metastatic melanoma to the skin) makes me a bit reluctant to start Imuran. He felt better after our conversation. Continue on prednisone 10 mg daily with repeat liver tests in 4 weeks.

## 2013-08-24 NOTE — Telephone Encounter (Signed)
New message   Pt off coumadin because of a procedure. When will he need to be seen again and dosage

## 2013-08-24 NOTE — Telephone Encounter (Signed)
Spoke with pt he was off coumadin for 3 days due to procedure yesterday where he had " something removed from his arm". He had a Melanoma removed.  He states he was instructed to resume his meds, thus instructed to take an extra 1/2 tab for 2 days then normal dose. F/U app made for next week.

## 2013-08-24 NOTE — Telephone Encounter (Signed)
Pt called and would like for Dr. Marina Goodell to call him regarding his LFT's. Pt states he is "upset" regarding the lack of progress with them coming down and wants to know what the next step/plan is. Dr. Marina Goodell notified.

## 2013-08-31 ENCOUNTER — Encounter (INDEPENDENT_AMBULATORY_CARE_PROVIDER_SITE_OTHER): Payer: Medicare Other | Admitting: Surgery

## 2013-08-31 ENCOUNTER — Ambulatory Visit (INDEPENDENT_AMBULATORY_CARE_PROVIDER_SITE_OTHER): Payer: Medicare Other | Admitting: Surgery

## 2013-09-02 ENCOUNTER — Ambulatory Visit (INDEPENDENT_AMBULATORY_CARE_PROVIDER_SITE_OTHER): Payer: Medicare Other | Admitting: General Surgery

## 2013-09-02 ENCOUNTER — Ambulatory Visit (INDEPENDENT_AMBULATORY_CARE_PROVIDER_SITE_OTHER): Payer: Medicare Other | Admitting: *Deleted

## 2013-09-02 ENCOUNTER — Encounter (INDEPENDENT_AMBULATORY_CARE_PROVIDER_SITE_OTHER): Payer: Self-pay

## 2013-09-02 VITALS — BP 118/68 | HR 64 | Temp 97.2°F | Resp 18 | Ht 74.0 in | Wt 207.0 lb

## 2013-09-02 DIAGNOSIS — I4891 Unspecified atrial fibrillation: Secondary | ICD-10-CM

## 2013-09-02 DIAGNOSIS — Z4802 Encounter for removal of sutures: Secondary | ICD-10-CM

## 2013-09-02 LAB — POCT INR: INR: 1.7

## 2013-09-02 NOTE — Progress Notes (Signed)
Pt came in today for suture removal s/p lesion excision.  Incision looks clean, no redness, no tenderness and no complaints by patient.  I cleaned the skin with chlorhexidine and removed the running stitch.  Re-cleansed the skin.  Pt has a f/u appt with Dr. Daphine Deutscher on 09/28/13.  Pt was informed to call the office if he has any fever, chills or aching at the incision site.

## 2013-09-08 ENCOUNTER — Telehealth: Payer: Self-pay | Admitting: Internal Medicine

## 2013-09-16 ENCOUNTER — Other Ambulatory Visit: Payer: Self-pay | Admitting: Cardiovascular Disease

## 2013-09-16 ENCOUNTER — Ambulatory Visit (INDEPENDENT_AMBULATORY_CARE_PROVIDER_SITE_OTHER): Payer: Medicare Other | Admitting: Pharmacist

## 2013-09-16 DIAGNOSIS — Z23 Encounter for immunization: Secondary | ICD-10-CM

## 2013-09-16 DIAGNOSIS — I4891 Unspecified atrial fibrillation: Secondary | ICD-10-CM

## 2013-09-16 LAB — POCT INR: INR: 1.8

## 2013-09-22 ENCOUNTER — Other Ambulatory Visit (INDEPENDENT_AMBULATORY_CARE_PROVIDER_SITE_OTHER): Payer: Medicare Other

## 2013-09-22 DIAGNOSIS — K754 Autoimmune hepatitis: Secondary | ICD-10-CM

## 2013-09-22 LAB — HEPATIC FUNCTION PANEL
Albumin: 3.4 g/dL — ABNORMAL LOW (ref 3.5–5.2)
Alkaline Phosphatase: 58 U/L (ref 39–117)
Total Bilirubin: 0.9 mg/dL (ref 0.3–1.2)
Total Protein: 6.4 g/dL (ref 6.0–8.3)

## 2013-09-22 NOTE — Telephone Encounter (Signed)
See Previous Encounter

## 2013-09-23 ENCOUNTER — Other Ambulatory Visit: Payer: Self-pay

## 2013-09-23 ENCOUNTER — Encounter: Payer: Self-pay | Admitting: Internal Medicine

## 2013-09-27 ENCOUNTER — Encounter (INDEPENDENT_AMBULATORY_CARE_PROVIDER_SITE_OTHER): Payer: Self-pay

## 2013-09-27 ENCOUNTER — Ambulatory Visit (HOSPITAL_BASED_OUTPATIENT_CLINIC_OR_DEPARTMENT_OTHER): Payer: Medicare Other | Admitting: Oncology

## 2013-09-27 VITALS — BP 104/60 | HR 56 | Temp 97.2°F | Resp 18 | Ht 74.0 in | Wt 207.7 lb

## 2013-09-27 DIAGNOSIS — C439 Malignant melanoma of skin, unspecified: Secondary | ICD-10-CM

## 2013-09-27 NOTE — Progress Notes (Signed)
Hematology and Oncology Follow Up Visit  Arthur [name] [id number] 1925-08-23 79 y.o. 09/27/2013 10:59 AM Marga Melnick, MDHopper, Titus Dubin, MD   Principle Diagnosis: 79 year old gentleman diagnosed with Malignant melanoma initially diagnosed in 2012 and found to have a T2b lesion with the depth of invasion of 1.75 mm and a Clark's level IV. Most recently he developed local regional relapse.    Prior Therapy: 1. In May of 2012, he underwent wide excision and sentinel lymph node biopsy done by Dr. Daphine Deutscher which showed no lymph node involvement and no residual malignant melanoma.  2. he is status post Re-excision of site of melanoma satellite lesion of left forearm. This was done on 08/23/2013.   Current therapy: Observation and surveillance.  Interim History:  Arthur [name] presents today for a followup visit. He is a gentleman with the above diagnosis and presents today after his recent surgical resection. As mentioned, he does not have any systemic metastasis as evidence by his most recent PET/CT scan that was unremarkable. He tolerated the procedure without any complications. He is continued to perform activities of daily living without any hindrance or decline. He does not report any skin lesions. He is continued to be on low-dose prednisone due to autoimmune hepatitis. He has not had any recent illnesses or hospitalizations not had any.  Medications: I have reviewed the patient's current medications.  Current Outpatient Prescriptions  Medication Sig Dispense Refill  . losartan (COZAAR) 100 MG tablet TAKE 1 TABLET (100 MG TOTAL) DAILY  90 tablet  0  . metoprolol tartrate (LOPRESSOR) 25 MG tablet Take 25 mg by mouth every morning.      . predniSONE (DELTASONE) 10 MG tablet Take 1 tablet (10 mg total) by mouth daily.  30 tablet  1  . warfarin (COUMADIN) 5 MG tablet Take 2.5-5 mg by mouth daily. 5 mg tablet on Monday, Wednesday and Friday all other days is 2.5 mg       No current  facility-administered medications for this visit.     Allergies:  Allergies  Allergen Reactions  . Penicillins Hives    Past Medical History, Surgical history, Social history, and Family History were reviewed and updated.  Review of Systems: Constitutional:  Negative for fever, chills, night sweats, anorexia, weight loss, pain. Cardiovascular: no chest pain or dyspnea on exertion Respiratory: negative Neurological: no TIA or stroke symptoms Dermatological: negative ENT: negative Skin: Negative. Gastrointestinal: no abdominal pain, change in bowel habits, or black or bloody stools Genito-Urinary: no dysuria, trouble voiding, or hematuria Hematological and Lymphatic: negative Breast: negative Musculoskeletal: negative Remaining ROS negative. Physical Exam: Blood pressure 104/60, pulse 56, temperature 97.2 F (36.2 C), temperature source Oral, resp. rate 18, height 6\' 2"  (1.88 m), weight 207 lb 11.2 oz (94.212 kg). ECOG:  General appearance: alert Head: Normocephalic, without obvious abnormality, atraumatic Neck: no adenopathy, no carotid bruit, no JVD, supple, symmetrical, trachea midline and thyroid not enlarged, symmetric, no tenderness/mass/nodules Lymph nodes: Cervical, supraclavicular, and axillary nodes normal. Heart:regular rate and rhythm, S1, S2 normal, no murmur, click, rub or gallop Lung:chest clear, no wheezing, rales, normal symmetric air entry Abdomin: soft, non-tender, without masses or organomegaly EXT:no erythema, induration, or nodules   Lab Results: Lab Results  Component Value Date   WBC 7.4 08/16/2013   HGB 14.4 08/16/2013   HCT 43.7 08/16/2013   MCV 98.6 08/16/2013   PLT 153 08/16/2013     Chemistry      Component Value Date/Time   NA 137 08/16/2013 1120  NA 138 07/22/2013 1354   K 4.9 08/16/2013 1120   K 5.5* 07/22/2013 1354   CL 101 08/16/2013 1120   CO2 27 08/16/2013 1120   CO2 26 07/22/2013 1354   BUN 26* 08/16/2013 1120   BUN 23.6 07/22/2013 1354    CREATININE 1.42* 08/16/2013 1120   CREATININE 1.4* 07/22/2013 1354   CREATININE 1.51* 04/20/2013 1639      Component Value Date/Time   CALCIUM 9.2 08/16/2013 1120   CALCIUM 9.1 07/22/2013 1354   ALKPHOS 58 09/22/2013 1129   ALKPHOS 63 07/22/2013 1354   AST 59* 09/22/2013 1129   AST 39* 07/22/2013 1354   ALT 82* 09/22/2013 1129   ALT 47 07/22/2013 1354   BILITOT 0.9 09/22/2013 1129   BILITOT 0.64 07/22/2013 1354       Impression and Plan:  79 year old gentleman with the following issues:  1. Malignant melanoma initially diagnosed in 2012 and found to have a T2b lesion with the depth of invasion of 1.75 mm and a Clark's level IV. No ulcerations or vascular invasion was noted and was treated with a wide excision and a sentinel lymph node biopsy that was negative. Most recently, he had a punch biopsy of a lesion in his left elbow which showed deposits of atypical melanocytes.   he is status post reexcision of his melanoma lesion done on 08/23/2013. The pathology reveals area of malignant melanoma with negative resection margins.   clinical examination and imaging studies failed to show any measurable disease and I see no role for systemic therapy at this point. However, he is at high risk of developing systemic relapse and we'll need to continue monitoring him closely. She will get a skin examination on a regular basis with Dr. Yetta Barre and I will continue to monitor him as well for medical oncology standpoint. Should he develops metastatic disease, he will be treated accordingly.   2. Followup: Will be in 6 months for clinical visit sooner if needed to.   SHADAD,FIRAS, MD 11/11/201410:59 AM

## 2013-09-28 ENCOUNTER — Ambulatory Visit (INDEPENDENT_AMBULATORY_CARE_PROVIDER_SITE_OTHER): Payer: Medicare Other | Admitting: Surgery

## 2013-09-28 ENCOUNTER — Telehealth: Payer: Self-pay | Admitting: Oncology

## 2013-09-28 ENCOUNTER — Encounter (INDEPENDENT_AMBULATORY_CARE_PROVIDER_SITE_OTHER): Payer: Self-pay | Admitting: Surgery

## 2013-09-28 VITALS — BP 112/60 | HR 68 | Resp 16 | Ht 74.0 in | Wt 206.2 lb

## 2013-09-28 DIAGNOSIS — C439 Malignant melanoma of skin, unspecified: Secondary | ICD-10-CM

## 2013-09-28 NOTE — Progress Notes (Signed)
Arthur [name] 79 y.o.  Body mass index is 26.46 kg/(m^2).  Patient Active Problem List   Diagnosis Date Noted  . Melanoma of skin, left forearm 07/27/2013  . Osteomyelitis of toe of right foot 04/20/2013  . Current use of long term anticoagulation 03/04/2013  . SKIN CANCER, HX OF 11/05/2010  . PSA, INCREASED 10/18/2009  . C A D 10/16/2008  . ECZEMA 10/16/2008  . TRANSIENT ISCHEMIC ATTACK, HX OF 10/16/2008  . LOW BACK PAIN, MILD 05/17/2008  . HYPERLIPIDEMIA 09/02/2007  . HYPERTENSION 09/02/2007  . MYOCARDIAL INFARCTION, HX OF 09/02/2007  . ATRIAL FIBRILLATION 09/02/2007    Allergies  Allergen Reactions  . Penicillins Hives    Past Surgical History  Procedure Laterality Date  . Cardiac catheterization  04/08/99    LAD 70% INTER 40-50% RCA 30-40%  . Appendectomy    . Coronary angioplasty  04/08/99    LAD  . Coronary angioplasty with stent placement  04/08/99    LAD  . Coronary angioplasty  10/17/97    RCA   . Melanoma excision  2013    lt arm  . Tonsillectomy    . Amputation Right 05/10/2013    Procedure: RIGHT SECOND TOE AMPUTATION;  Surgeon: Velna Ochs, MD;  Location: [facility];  Service: Orthopedics;  Laterality: Right;  . Melanoma excision Left 08/23/2013    Procedure: MELANOMA EXCISION;  Surgeon: Valarie Merino, MD;  Location: [facility];  Service: General;  Laterality: Left;   Marga Melnick, MD No diagnosis found.  followup after excision and reexcision of melanoma of the forearm. I saw Dr. Blima Ledger note.  He is getting surveillance by Dr. Yetta Barre every 3 months.  He recently required amputation of his second toe due to osteomyelitis-probably related to insensitivity but he is not diabetic.  I want to request that his melanoma tissue be sent to [facility] for the [facility] test. Will communicate this to Dr. Yetta Barre and Dr. Venda Rodes if we are able to review his old material.   I will see him as needed.   Matt B. Daphine Deutscher, MD,  [facility] [phone] beeper [phone]  09/28/2013 1:48 PM

## 2013-09-28 NOTE — Patient Instructions (Signed)
Melanoma Melanoma is the least common, but most dangerous, form of skin cancer. This is because it can spread (metastasize) to other organs and can be life-threatening. Melanoma is a cancerous (malignant) tumor that begins in a certain type of cells, called melanocytes. Melanocytes are the cells that produce the color (pigment) called melanin. Melanin colors our skin, hair, eyes, and moles. CAUSES  The exact cause of melanoma is unknown. You may have a higher risk if you:  Spend or have spent a lot of time in the sun. This includes sunlamp and tanning booth exposure.  Have had sunburns. This put you at a particularly increased risk for melanoma. The more blistering sunburns a person has, the higher the risk.  Spend time in parts of the world with more intense sunlight.  Have fair skin that does not tan easily. You may have a lower risk if you have a darker skin color. However, people with darker skin can get melanoma, especially on the hands and feet (acral areas).  Have a close relative (parent, sibling) who has melanoma.  Have a large number of skin moles (more than 100). SYMPTOMS  A skin mole is suspicious if it has any of these 5 traits. This is called the ABCDE's of melanoma:  Asymmetry: Irregular shape, not simply round or oval.  Border: Edge of the mole is irregular, not smooth.  Color: Mole may have multiple colors in it, including brown, black, blue, red, or tan.  Diameter: More than 0.2 inches (6 mm) across.  Evolving: Any unusual change or symptoms in the mole, such as pain, itching, stinging, sensitivity, or bleeding. A mole that is noticeably changing in appearance, or any new mole, should be checked for melanoma. In general, people develop new moles until age 30. New moles after this age should be brought to the attention of your caregiver. DIAGNOSIS  Your caregiver can look at your skin and find lesions or moles that may be suspicious. A patient may also notice a mole  with symptoms or a mole that does not look like most of the other moles on his or her body. This is called the "ugly duckling" sign. A tissue sample (biopsy) examined under a microscope is needed to determine if it is melanoma. The size and extent of the biopsy will depend on the location, size, and appearance of the skin lesion or mole. The biopsy can also reveal whether melanoma has spread to deeper layers of the skin. TREATMENT  Surgery to completely remove the melanoma is required. Lymph nodes may also be removed. If the melanoma has spread to other organs, such as the liver, lungs, bone, or brain, cancer-fighting drugs (chemotherapy) must be used. Your caregiver will discuss your treatment options with you. You can ask about being included in a clinical trial to evaluate new forms of treatment. Melanoma can occasionally recur years after the initial diagnosis. If you have melanoma, you will need follow-up visits with your caregiver for many years. PREVENTION  Risk for melanoma can be reduced by minimizing sun exposure. Practice the 3 S's:  Slip on a shirt.  Slop on sunscreen.  Slap on a hat. Do not spend time in the sun during peak midafternoon hours. Sunscreen/sunblock with SPF 30 or higher and UVA/UVB block should be applied regularly. You should do this even during brief exposure to sunlight. You should also do this on cloudy days and in winter, even though the perceived sunlight is less. Always avoid sunburn! Wear sunglasses that block UV   light. Be sure to see your caregiver if you have any new or changing moles. HOME CARE INSTRUCTIONS   Follow wound care instructions after surgical removal of your melanoma.  Practice good sun avoidance and protective measures as described above.  Let your close family members (parents, children, siblings) know about your diagnosis. This puts them at a higher risk of getting melanoma than the general population. SEEK MEDICAL CARE IF:   You notice any  new moles, or you have any moles that are changing.  You have had a melanoma removed and you notice a new growth near the same location.  You have had a melanoma removed and you experience any new or unexplained health problems. Document Released: 11/03/2005 Document Revised: 01/26/2012 Document Reviewed: 02/22/2010 [employer] Patient Information 2014 [employer].  

## 2013-09-28 NOTE — Telephone Encounter (Signed)
Per 11/11 POF sch 6 mo fup in May 2015 LVMM and mailed cal shh

## 2013-09-30 ENCOUNTER — Encounter: Payer: Self-pay | Admitting: Cardiovascular Disease

## 2013-09-30 NOTE — Telephone Encounter (Signed)
This encounter was created in error - please disregard.

## 2013-09-30 NOTE — Telephone Encounter (Signed)
New message ° ° ° ° °Returned Lauren's call. °

## 2013-10-04 ENCOUNTER — Ambulatory Visit (INDEPENDENT_AMBULATORY_CARE_PROVIDER_SITE_OTHER): Payer: Medicare Other | Admitting: Cardiovascular Disease

## 2013-10-04 ENCOUNTER — Encounter: Payer: Self-pay | Admitting: Cardiovascular Disease

## 2013-10-04 ENCOUNTER — Ambulatory Visit (INDEPENDENT_AMBULATORY_CARE_PROVIDER_SITE_OTHER): Payer: Medicare Other | Admitting: General Practice

## 2013-10-04 VITALS — BP 130/60 | HR 58 | Ht 74.0 in | Wt 208.0 lb

## 2013-10-04 DIAGNOSIS — I2510 Atherosclerotic heart disease of native coronary artery without angina pectoris: Secondary | ICD-10-CM

## 2013-10-04 DIAGNOSIS — I4891 Unspecified atrial fibrillation: Secondary | ICD-10-CM

## 2013-10-04 DIAGNOSIS — I10 Essential (primary) hypertension: Secondary | ICD-10-CM

## 2013-10-04 LAB — POCT INR: INR: 1.8

## 2013-10-04 NOTE — Progress Notes (Signed)
    HPI:  79 year old gentleman presenting for followup evaluation. The patient is followed for atrial fibrillation on chronic anticoagulation with warfarin. He also has coronary artery disease and with history of anteroseptal and inferior MIs in the 1990s. He underwent stenting of the LAD in 2000.  The patient has had a tough year. He was diagnosed with melanoma and underwent wide excision on the left arm. He's had close followup with a recent PET scan showing no obvious disease activity per his report. He also lost a son to lymphoma.  From a cardiac perspective he has been doing fine. He walks on the treadmill for 25 minutes without exertional symptoms. He denies chest pain, chest pressure, shortness of breath, or palpitations. He does have chronic leg swelling. He has tried compression stockings without success. Leg elevation has worked best. He has no claudication symptoms.  Outpatient Encounter Prescriptions as of 10/04/2013  Medication Sig  . losartan (COZAAR) 100 MG tablet TAKE 1 TABLET (100 MG TOTAL) DAILY  . metoprolol tartrate (LOPRESSOR) 25 MG tablet Take 25 mg by mouth every morning.  . predniSONE (DELTASONE) 10 MG tablet Take 1 tablet (10 mg total) by mouth daily.  Marland Kitchen warfarin (COUMADIN) 5 MG tablet Take 2.5-5 mg by mouth daily. 5 mg tablet on Monday, Wednesday and Friday all other days is 2.5 mg    Allergies  Allergen Reactions  . Penicillins Hives    Past Medical History  Diagnosis Date  . Coronary artery disease     MI, atherectomy 1993  . Hyperlipidemia   . Arrhythmia     1995  . Atrial fibrillation   . History of transient ischemic attack (TIA) 2005    double vision  . Hypertension   . History of gallstones   . 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 Eczema   . Elevated LFTs   . Skin cancer   . DDD (degenerative disc disease)   . Pneumonia     long time ago   ROS: Negative except as per HPI  BP 130/60  Pulse 58  Ht 6\' 2"  (1.88 m)  Wt 208 lb (94.348 kg)   BMI 26.69 kg/m2  PHYSICAL EXAM: Pt is alert and oriented, elderly male in NAD HEENT: normal Neck: JVP - normal, carotids 2+= without bruits Lungs: CTA bilaterally CV: Irregularly irregular with grade 2/6 systolic murmur at the left sternal border Abd: soft, NT, Positive BS, no hepatomegaly Ext: There is 2+ pretibial edema bilaterally, distal pulses intact and equal Skin: warm/dry with stasis dermatitis  EKG:  Atrial fibrillation with slow ventricular response 58 beats per minute, cannot rule out anterior infarct age undetermined.  ASSESSMENT AND PLAN: 1. Coronary atherosclerosis, native vessel. The patient is asymptomatic without anginal symptoms at the present time. He is anticoagulated with warfarin so he is not on aspirin. He is tolerating metoprolol.  2. Hypertension. Blood pressure is well controlled on losartan and metoprolol.  3. Permanent atrial fibrillation. He is doing well with the strategy of rate control and anticoagulation.  4. Hyperlipidemia. Lipids from January show a cholesterol of 133, triglycerides 62, HDL 39, and LDL 81.  Tonny Bollman 10/04/2013 10:49 AM

## 2013-10-04 NOTE — Patient Instructions (Signed)
Your physician wants you to follow-up in: 1 YEAR with Dr Cooper.  You will receive a reminder letter in the mail two months in advance. If you don't receive a letter, please call our office to schedule the follow-up appointment.  Your physician recommends that you continue on your current medications as directed. Please refer to the Current Medication list given to you today.  

## 2013-10-11 ENCOUNTER — Other Ambulatory Visit: Payer: Self-pay | Admitting: Internal Medicine

## 2013-10-12 ENCOUNTER — Ambulatory Visit: Payer: Medicare Other | Admitting: Cardiovascular Disease

## 2013-10-20 ENCOUNTER — Other Ambulatory Visit: Payer: Self-pay

## 2013-10-20 ENCOUNTER — Other Ambulatory Visit (INDEPENDENT_AMBULATORY_CARE_PROVIDER_SITE_OTHER): Payer: Medicare Other

## 2013-10-20 DIAGNOSIS — R7989 Other specified abnormal findings of blood chemistry: Secondary | ICD-10-CM

## 2013-10-20 LAB — HEPATIC FUNCTION PANEL
ALT: 56 U/L — ABNORMAL HIGH (ref 0–53)
Alkaline Phosphatase: 56 U/L (ref 39–117)
Bilirubin, Direct: 0.2 mg/dL (ref 0.0–0.3)
Total Bilirubin: 1.1 mg/dL (ref 0.3–1.2)

## 2013-10-25 ENCOUNTER — Ambulatory Visit (INDEPENDENT_AMBULATORY_CARE_PROVIDER_SITE_OTHER): Payer: Medicare Other | Admitting: *Deleted

## 2013-10-25 DIAGNOSIS — I4891 Unspecified atrial fibrillation: Secondary | ICD-10-CM

## 2013-10-25 LAB — POCT INR: INR: 2.2

## 2013-11-07 ENCOUNTER — Other Ambulatory Visit: Payer: Self-pay | Admitting: Dermatology

## 2013-11-11 ENCOUNTER — Encounter: Payer: Self-pay | Admitting: Internal Medicine

## 2013-11-11 DIAGNOSIS — C44621 Squamous cell carcinoma of skin of unspecified upper limb, including shoulder: Secondary | ICD-10-CM | POA: Insufficient documentation

## 2013-11-22 ENCOUNTER — Ambulatory Visit (INDEPENDENT_AMBULATORY_CARE_PROVIDER_SITE_OTHER): Payer: Medicare Other | Admitting: *Deleted

## 2013-11-22 ENCOUNTER — Other Ambulatory Visit (INDEPENDENT_AMBULATORY_CARE_PROVIDER_SITE_OTHER): Payer: Medicare Other

## 2013-11-22 ENCOUNTER — Other Ambulatory Visit: Payer: Self-pay

## 2013-11-22 DIAGNOSIS — R7989 Other specified abnormal findings of blood chemistry: Secondary | ICD-10-CM

## 2013-11-22 DIAGNOSIS — R945 Abnormal results of liver function studies: Principal | ICD-10-CM

## 2013-11-22 DIAGNOSIS — I4891 Unspecified atrial fibrillation: Secondary | ICD-10-CM

## 2013-11-22 LAB — HEPATIC FUNCTION PANEL
ALK PHOS: 62 U/L (ref 39–117)
ALT: 59 U/L — ABNORMAL HIGH (ref 0–53)
AST: 47 U/L — ABNORMAL HIGH (ref 0–37)
Albumin: 3.5 g/dL (ref 3.5–5.2)
Bilirubin, Direct: 0.2 mg/dL (ref 0.0–0.3)
TOTAL PROTEIN: 6.3 g/dL (ref 6.0–8.3)
Total Bilirubin: 0.9 mg/dL (ref 0.3–1.2)

## 2013-11-22 LAB — POCT INR: INR: 2.8

## 2013-11-22 MED ORDER — PREDNISONE 5 MG PO TABS: 7.5000 mg | ORAL_TABLET | Freq: Every day | ORAL | Status: DC

## 2013-12-13 ENCOUNTER — Ambulatory Visit (INDEPENDENT_AMBULATORY_CARE_PROVIDER_SITE_OTHER): Payer: Medicare Other | Admitting: Pharmacist

## 2013-12-13 DIAGNOSIS — I4891 Unspecified atrial fibrillation: Secondary | ICD-10-CM

## 2013-12-13 LAB — POCT INR: INR: 2.1

## 2013-12-14 ENCOUNTER — Other Ambulatory Visit: Payer: Self-pay | Admitting: *Deleted

## 2013-12-14 ENCOUNTER — Telehealth: Payer: Self-pay | Admitting: Internal Medicine

## 2013-12-14 ENCOUNTER — Telehealth: Payer: Self-pay | Admitting: *Deleted

## 2013-12-14 DIAGNOSIS — R7989 Other specified abnormal findings of blood chemistry: Secondary | ICD-10-CM

## 2013-12-14 DIAGNOSIS — R945 Abnormal results of liver function studies: Principal | ICD-10-CM

## 2013-12-14 MED ORDER — WARFARIN SODIUM 5 MG PO TABS: ORAL_TABLET | ORAL | Status: DC

## 2013-12-14 MED ORDER — LOSARTAN POTASSIUM 100 MG PO TABS: ORAL_TABLET | ORAL | Status: DC

## 2013-12-14 MED ORDER — PREDNISONE 5 MG PO TABS: 7.5000 mg | ORAL_TABLET | Freq: Every day | ORAL | Status: DC

## 2013-12-14 NOTE — Telephone Encounter (Signed)
Patient requests coumadin refill to be sent to [facility]. Thanks, MI

## 2013-12-14 NOTE — Telephone Encounter (Signed)
Discussed with pt that he could come tomorrow for labs and refill sent to pharmacy. Pt will be in [place] starting on Friday, pt will be using his cell phone.

## 2013-12-15 ENCOUNTER — Other Ambulatory Visit (INDEPENDENT_AMBULATORY_CARE_PROVIDER_SITE_OTHER): Payer: Medicare Other

## 2013-12-15 ENCOUNTER — Other Ambulatory Visit: Payer: Self-pay | Admitting: Cardiovascular Disease

## 2013-12-15 ENCOUNTER — Other Ambulatory Visit: Payer: Self-pay

## 2013-12-15 DIAGNOSIS — R7989 Other specified abnormal findings of blood chemistry: Secondary | ICD-10-CM

## 2013-12-15 DIAGNOSIS — R945 Abnormal results of liver function studies: Principal | ICD-10-CM

## 2013-12-15 LAB — HEPATIC FUNCTION PANEL
ALT: 74 U/L — ABNORMAL HIGH (ref 0–53)
AST: 57 U/L — ABNORMAL HIGH (ref 0–37)
Albumin: 3.3 g/dL — ABNORMAL LOW (ref 3.5–5.2)
Alkaline Phosphatase: 63 U/L (ref 39–117)
Bilirubin, Direct: 0.2 mg/dL (ref 0.0–0.3)
TOTAL PROTEIN: 6.4 g/dL (ref 6.0–8.3)
Total Bilirubin: 1.1 mg/dL (ref 0.3–1.2)

## 2013-12-23 ENCOUNTER — Other Ambulatory Visit: Payer: Self-pay

## 2013-12-23 MED ORDER — LOSARTAN POTASSIUM 100 MG PO TABS: ORAL_TABLET | ORAL | Status: DC

## 2014-01-10 ENCOUNTER — Other Ambulatory Visit (INDEPENDENT_AMBULATORY_CARE_PROVIDER_SITE_OTHER): Payer: Medicare Other

## 2014-01-10 ENCOUNTER — Ambulatory Visit (INDEPENDENT_AMBULATORY_CARE_PROVIDER_SITE_OTHER): Payer: Medicare Other | Admitting: Pharmacist

## 2014-01-10 DIAGNOSIS — I4891 Unspecified atrial fibrillation: Secondary | ICD-10-CM

## 2014-01-10 DIAGNOSIS — R7989 Other specified abnormal findings of blood chemistry: Secondary | ICD-10-CM

## 2014-01-10 DIAGNOSIS — R945 Abnormal results of liver function studies: Principal | ICD-10-CM

## 2014-01-10 LAB — HEPATIC FUNCTION PANEL
ALBUMIN: 3.5 g/dL (ref 3.5–5.2)
ALT: 204 U/L — AB (ref 0–53)
AST: 131 U/L — AB (ref 0–37)
Alkaline Phosphatase: 72 U/L (ref 39–117)
Bilirubin, Direct: 0.2 mg/dL (ref 0.0–0.3)
TOTAL PROTEIN: 6.9 g/dL (ref 6.0–8.3)
Total Bilirubin: 1 mg/dL (ref 0.3–1.2)

## 2014-01-10 LAB — POCT INR: INR: 2.9

## 2014-01-10 MED ORDER — LOSARTAN POTASSIUM 100 MG PO TABS: ORAL_TABLET | ORAL | Status: DC

## 2014-01-11 ENCOUNTER — Other Ambulatory Visit: Payer: Self-pay

## 2014-01-11 DIAGNOSIS — R7989 Other specified abnormal findings of blood chemistry: Secondary | ICD-10-CM

## 2014-01-11 DIAGNOSIS — R945 Abnormal results of liver function studies: Principal | ICD-10-CM

## 2014-01-30 ENCOUNTER — Telehealth: Payer: Self-pay | Admitting: Internal Medicine

## 2014-01-30 MED ORDER — PREDNISONE 10 MG PO TABS: 10.0000 mg | ORAL_TABLET | Freq: Every day | ORAL | Status: DC

## 2014-01-30 NOTE — Telephone Encounter (Signed)
Refilled Prednisone.  10mg  per Rosanne Sack

## 2014-01-31 NOTE — Telephone Encounter (Signed)
Sent Prednisone rx to incorrect pharmacy; called it in the the [facility] in [place] then called patient to let him know.

## 2014-02-15 ENCOUNTER — Other Ambulatory Visit: Payer: Self-pay

## 2014-02-16 ENCOUNTER — Other Ambulatory Visit (INDEPENDENT_AMBULATORY_CARE_PROVIDER_SITE_OTHER): Payer: Medicare Other

## 2014-02-16 DIAGNOSIS — R7989 Other specified abnormal findings of blood chemistry: Secondary | ICD-10-CM

## 2014-02-16 DIAGNOSIS — R945 Abnormal results of liver function studies: Principal | ICD-10-CM

## 2014-02-16 LAB — HEPATIC FUNCTION PANEL
ALT: 140 U/L — ABNORMAL HIGH (ref 0–53)
AST: 85 U/L — ABNORMAL HIGH (ref 0–37)
Albumin: 3.1 g/dL — ABNORMAL LOW (ref 3.5–5.2)
Alkaline Phosphatase: 68 U/L (ref 39–117)
Bilirubin, Direct: 0.1 mg/dL (ref 0.0–0.3)
Total Bilirubin: 1 mg/dL (ref 0.3–1.2)
Total Protein: 6.4 g/dL (ref 6.0–8.3)

## 2014-02-20 ENCOUNTER — Other Ambulatory Visit: Payer: Self-pay

## 2014-02-20 DIAGNOSIS — K754 Autoimmune hepatitis: Secondary | ICD-10-CM

## 2014-02-21 ENCOUNTER — Encounter: Payer: Self-pay | Admitting: Internal Medicine

## 2014-02-21 ENCOUNTER — Ambulatory Visit (INDEPENDENT_AMBULATORY_CARE_PROVIDER_SITE_OTHER): Payer: Medicare Other | Admitting: Internal Medicine

## 2014-02-21 ENCOUNTER — Telehealth: Payer: Self-pay | Admitting: Cardiovascular Disease

## 2014-02-21 VITALS — BP 122/64 | HR 56 | Ht 71.0 in | Wt 208.4 lb

## 2014-02-21 DIAGNOSIS — R7989 Other specified abnormal findings of blood chemistry: Secondary | ICD-10-CM

## 2014-02-21 DIAGNOSIS — K754 Autoimmune hepatitis: Secondary | ICD-10-CM

## 2014-02-21 DIAGNOSIS — R945 Abnormal results of liver function studies: Secondary | ICD-10-CM

## 2014-02-21 NOTE — Patient Instructions (Signed)
Follow up with Dr. Henrene Pastor in office in 6 months.  Remember to stop down in our lab at your convenience at the end of the month to have lab work done

## 2014-02-21 NOTE — Progress Notes (Signed)
HISTORY OF PRESENT ILLNESS:  Arthur [name] is a 80 y.o. male who was initially evaluated 01/12/2013 regarding elevated hepatic transaminases (see dictation for details). He was felt to have autoimmune hepatitis, after laboratory workup, but did not have a liver biopsy (on Coumadin, was going to [place], pros and cons discussed). Possible causes were drug-induced (Lipitor) or idiopathic. He was initiated on 40 mg of prednisone while in [place]. Marked improvement in liver tests. His prednisone was tapered fairly rapidly down to 5 mg. He did have a toe amputation. Personal history of melanoma as well as basal cell cancer. At one point, liver tests normal. However, with lower dose prednisone rebound transaminitis. Prednisone recently increased to 10 mg daily. Most recent AST 85, ALT 140. Total protein 6.4 and albumin 3.1. Normal alkaline phosphatase and bilirubin. He is on Coumadin with last INR 2.9. Only obvious complaint with prednisone is possible sleep pattern disruption. He has had chronic ankle edema. Also complains of fatigue though has had beta blocker added to his medical regimen. He comes into the office today to discuss the current state of his liver disease as well as treatment strategy moving forward  REVIEW OF SYSTEMS:  All non-GI ROS negative except for slow heart rate, skin rash, swelling of feet and legs, trouble sleeping  Past Medical History  Diagnosis Date  . Coronary artery disease     MI, atherectomy 1993  . Hyperlipidemia   . Arrhythmia     1995  . Atrial fibrillation   . History of transient ischemic attack (TIA) 2005    double vision  . Hypertension   . History of gallstones   . 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 Eczema   . Elevated LFTs   . Skin cancer   . DDD (degenerative disc disease)   . Pneumonia     long time ago    Past Surgical History  Procedure Laterality Date  . Cardiac catheterization  04/08/99    LAD 70% INTER 40-50% RCA 30-40%  .  Appendectomy    . Coronary angioplasty  04/08/99    LAD  . Coronary angioplasty with stent placement  04/08/99    LAD  . Coronary angioplasty  10/17/97    RCA   . Melanoma excision  2013    lt arm  . Tonsillectomy    . Amputation Right 05/10/2013    Procedure: RIGHT SECOND TOE AMPUTATION;  Surgeon: Hessie Dibble, MD;  Location: [facility];  Service: Orthopedics;  Laterality: Right;  . Melanoma excision Left 08/23/2013    Procedure: MELANOMA EXCISION;  Surgeon: Pedro Earls, MD;  Location: [facility];  Service: General;  Laterality: Left;    Social History Arthur [name]  reports that he quit smoking about 46 years ago. His smoking use included Cigarettes and Cigars. He smoked 0.00 packs per day. He has never used smokeless tobacco. He reports that he drinks alcohol. He reports that he does not use illicit drugs.  family history includes Heart attack (age of onset: 79) in his mother; Pneumonia in his father.  Allergies  Allergen Reactions  . Penicillins Hives       PHYSICAL EXAMINATION: Vital signs: BP 122/64  Pulse 56  Ht 5\' 11"  (1.803 m)  Wt 208 lb 6 oz (94.518 kg)  BMI 29.08 kg/m2 General: Well-developed, well-nourished, no acute distress HEENT: Sclerae are anicteric, conjunctiva pink. Oral mucosa intact Lungs: Clear Heart: Regular with bradycardia Abdomen: soft, nontender, nondistended, no obvious  ascites, no peritoneal signs, normal bowel sounds. No organomegaly. Extremities: 2+ pitting edema. Chronic stasis changes bilaterally Neuro: No asterixis Psychiatric: alert and oriented x3. Cooperative   ASSESSMENT:  #1. Autoimmune hepatitis, drug-induced or primary. No biopsy. Seems to require at least low-dose prednisone. Not a good candidate for immunomodulatory therapy with history of multiple skin cancers. I discussed with him today treatment strategies. Also discussed with him concerns of untreated liver disease. At this point, we would like to find  the lowest dose of prednisone that would keep his transaminases less than 2 times the upper limit of normal. We also discussed potential long-term side effects or complications of prednisone. He understands and weighs this against long-term complications of progressive liver disease potentially. I spent well over 30 minutes counseling the patient.   PLAN:  #1. Continue prednisone 10 mg daily #2. Repeat liver tests in 3 weeks as already planned #3. Will continue to monitor liver tests and adjust prednisone accordingly #4. See me in the office in 6 months. Contact the office sooner for questions or problems #5. Will discuss with his cardiology concerns over fatigue and possible low blood pressure. As well, possible use of diuretic as both blood pressure lowering agent (if needed) and something to address his chronic lower extremity edema

## 2014-02-21 NOTE — Telephone Encounter (Signed)
New problem   Pt went to see GI physician and his bp is down and he feels sluggish with leg swelling. Please call pt concerning this. Pt would like some water pills

## 2014-02-21 NOTE — Telephone Encounter (Signed)
Left message on machine for pt to contact the office.   

## 2014-02-22 NOTE — Telephone Encounter (Signed)
Follow up ° ° ° ° °Returning a nurses call °

## 2014-02-22 NOTE — Telephone Encounter (Signed)
Called to speak with pt - per wife pt is not in and will call back tomorrow.  Advised I will forward message to [place].

## 2014-02-22 NOTE — Telephone Encounter (Signed)
Called back to speak with pt at number that was left - pt wife pt is not there and should be at his office.  Called office number which rang multiple times with no answer.  Will attempt again later.

## 2014-02-22 NOTE — Telephone Encounter (Signed)
Follow up ° ° ° ° °Returning [name]'s call °

## 2014-02-23 NOTE — Telephone Encounter (Signed)
Follow up     Patient calling back to speak with nurse from yesterday.

## 2014-02-23 NOTE — Telephone Encounter (Signed)
I spoke with the pt and he complains of low BP, fatigue and swelling in legs.  The pt does monitor his BP at home and he has seen readings of 90/60.  The pt is currently taking prednisone and Dr Burt Knack felt like the pt's swelling was related to this medication.  Recommendation was made to limit salt intake, elevate legs and decrease losartan to 50mg  daily.  The pt will continue to monitor BP at home and will call the office in 1 week with readings to determine if other changes are needed.  Pt verbalized understanding and agreed with plan.

## 2014-03-03 ENCOUNTER — Telehealth: Payer: Self-pay | Admitting: Cardiovascular Disease

## 2014-03-03 MED ORDER — LOSARTAN POTASSIUM 50 MG PO TABS: 50.0000 mg | ORAL_TABLET | Freq: Every day | ORAL | Status: DC

## 2014-03-03 NOTE — Telephone Encounter (Signed)
New message     Pt has had a change in his bp medications---want to give you bp readings

## 2014-03-03 NOTE — Telephone Encounter (Signed)
Follow up     Pt want to talk to [facility] about his blood pressure

## 2014-03-03 NOTE — Telephone Encounter (Signed)
4/13 138/83, 76 and 121/63, 56 4/14 143/75, 65 and 126/55, 49 and 114/57, 71 4/15 157/92, 59 and 135/68 59 4/16 113/55, 124/68   I spoke with the pt and his BP has improved since decreasing Losartan to 50mg  daily.  The pt also has more energy.  I made the pt aware that we will continue current dosage of medication. The pt will continue to monitor BP and call the office with any other questions or concerns. Rx sent to pharmacy.

## 2014-03-03 NOTE — Telephone Encounter (Signed)
Agree with plan as per Theodosia Quay thx

## 2014-03-13 ENCOUNTER — Other Ambulatory Visit (INDEPENDENT_AMBULATORY_CARE_PROVIDER_SITE_OTHER): Payer: Medicare Other

## 2014-03-13 ENCOUNTER — Telehealth: Payer: Self-pay

## 2014-03-13 DIAGNOSIS — K754 Autoimmune hepatitis: Secondary | ICD-10-CM

## 2014-03-13 LAB — HEPATIC FUNCTION PANEL
ALK PHOS: 59 U/L (ref 39–117)
ALT: 78 U/L — AB (ref 0–53)
AST: 54 U/L — AB (ref 0–37)
Albumin: 3.3 g/dL — ABNORMAL LOW (ref 3.5–5.2)
BILIRUBIN DIRECT: 0.2 mg/dL (ref 0.0–0.3)
BILIRUBIN TOTAL: 1.1 mg/dL (ref 0.3–1.2)
Total Protein: 6.5 g/dL (ref 6.0–8.3)

## 2014-03-13 NOTE — Telephone Encounter (Signed)
Message copied by Algernon Huxley on Mon Mar 13, 2014  2:35 PM ------      Message from: Deztinee Lohmeyer, Virginia R      Created: Mon Feb 20, 2014 11:31 AM      Regarding: Labs       Needs labs in 3 weeks, order in epic. ------

## 2014-03-13 NOTE — Telephone Encounter (Signed)
Pt knows to come for labs. 

## 2014-03-14 ENCOUNTER — Other Ambulatory Visit: Payer: Self-pay

## 2014-03-14 DIAGNOSIS — K754 Autoimmune hepatitis: Secondary | ICD-10-CM

## 2014-03-17 NOTE — Telephone Encounter (Signed)
Prednisone dosage and rx addressed by Rosanne Sack 03/07/2013

## 2014-03-19 ENCOUNTER — Other Ambulatory Visit: Payer: Self-pay | Admitting: Cardiovascular Disease

## 2014-03-23 ENCOUNTER — Other Ambulatory Visit: Payer: Self-pay | Admitting: *Deleted

## 2014-03-23 NOTE — Telephone Encounter (Signed)
Needs coumadin clinic appointment

## 2014-03-28 ENCOUNTER — Telehealth: Payer: Self-pay | Admitting: Oncology

## 2014-03-28 ENCOUNTER — Encounter: Payer: Self-pay | Admitting: Oncology

## 2014-03-28 ENCOUNTER — Ambulatory Visit (HOSPITAL_BASED_OUTPATIENT_CLINIC_OR_DEPARTMENT_OTHER): Payer: Medicare Other | Admitting: Oncology

## 2014-03-28 VITALS — BP 136/68 | HR 67 | Temp 97.8°F | Resp 18 | Ht 71.0 in | Wt 207.0 lb

## 2014-03-28 DIAGNOSIS — C439 Malignant melanoma of skin, unspecified: Secondary | ICD-10-CM

## 2014-03-28 DIAGNOSIS — C4360 Malignant melanoma of unspecified upper limb, including shoulder: Secondary | ICD-10-CM

## 2014-03-28 NOTE — Progress Notes (Signed)
Hematology and Oncology Follow Up Visit  Arthur [name] [id number] 09/07/1925 80 y.o. 03/28/2014 10:51 AM Arthur [name], MDHopper, Arthur Penna, MD   Principle Diagnosis: 80 year old gentleman diagnosed with Malignant melanoma initially diagnosed in 2012 and found to have a T2b lesion with the depth of invasion of 1.75 mm and a Clark's level IV. He developed regional relapse in October of 2014.    Prior Therapy: 1. In May of 2012, he underwent wide excision and sentinel lymph node biopsy done by Dr. Hassell Done which showed no lymph node involvement and no residual malignant melanoma.  2. he is status post Re-excision of site of melanoma satellite lesion of left forearm. This was done on 08/23/2013.   Current therapy: Observation and surveillance.  Interim History:  [name] presents today for a followup visit. He is a gentleman with the above diagnosis and presents today for a followup visit. His most recent PET/CT scan in September of 2014 was unremarkable. He is not reporting any new symptoms. He did report some slight weakness and fatigue that was attributed to his blood pressure medication. He is continued to perform activities of daily living without any hindrance or decline. He does not report any skin lesions. He is continued to be on low-dose prednisone due to autoimmune hepatitis to his increased recently to 10 mg daily. He has not had any recent illnesses or hospitalizations. He does not report any headaches or blurry vision or double vision. Does not report any chest pain or shortness of breath. As that report any cough or hemoptysis. Does not report any nausea or vomiting or abdominal pain. Did not report any hematochezia or melena. Did not report any frequency urgency or hesitancy. Does not report any musculoskeletal complaints. Her recent rashes or lesions. The rest of the review of system is unremarkable.   Medications: I have reviewed the patient's current medications.  Current  Outpatient Prescriptions  Medication Sig Dispense Refill  . losartan (COZAAR) 50 MG tablet Take 1 tablet (50 mg total) by mouth daily.  90 tablet  3  . metoprolol tartrate (LOPRESSOR) 25 MG tablet TAKE 1 TABLET DAILY  90 tablet  1  . predniSONE (DELTASONE) 10 MG tablet Take 1 tablet (10 mg total) by mouth daily.  30 tablet  1  . warfarin (COUMADIN) 5 MG tablet Patient needs to call and make coumadin clinic appointment this is a partial refill, 1 tablet daily except 1/2 tablet on Tuesdays, Thursdays and Saturdays or as directed by coumadin clinic  15 tablet  0   No current facility-administered medications for this visit.     Allergies:  Allergies  Allergen Reactions  . Penicillins Hives       Physical Exam: Blood pressure 136/68, pulse 67, temperature 97.8 F (36.6 C), temperature source Oral, resp. rate 18, height 5\' 11"  (1.803 m), weight 207 lb (93.895 kg), SpO2 96.00%. ECOG: 1 General appearance: alert awake not in any distress. Head: Normocephalic, without any masses or lesions. Neck: no adenopathy, or masses. Lymph nodes: Cervical, supraclavicular, and axillary nodes normal.no inguinal adenopathy noted either. Heart:regular rate and rhythm, S1, S2 normal, no murmur, click, rub or gallop Lung:chest clear, no wheezing, rales, normal symmetric air entry Abdomin: soft, non-tender, without masses or organomegaly no rebound or guarding EXT:no erythema, induration, or nodules Skin showed no rashes or lesions.  Lab Results: Lab Results  Component Value Date   WBC 7.4 08/16/2013   HGB 14.4 08/16/2013   HCT 43.7 08/16/2013   MCV 98.6 08/16/2013  PLT 153 08/16/2013     Chemistry      Component Value Date/Time   NA 137 08/16/2013 1120   NA 138 07/22/2013 1354   K 4.9 08/16/2013 1120   K 5.5* 07/22/2013 1354   CL 101 08/16/2013 1120   CO2 27 08/16/2013 1120   CO2 26 07/22/2013 1354   BUN 26* 08/16/2013 1120   BUN 23.6 07/22/2013 1354   CREATININE 1.42* 08/16/2013 1120   CREATININE 1.4*  07/22/2013 1354   CREATININE 1.51* 04/20/2013 1639      Component Value Date/Time   CALCIUM 9.2 08/16/2013 1120   CALCIUM 9.1 07/22/2013 1354   ALKPHOS 59 03/13/2014 1510   ALKPHOS 63 07/22/2013 1354   AST 54* 03/13/2014 1510   AST 39* 07/22/2013 1354   ALT 78* 03/13/2014 1510   ALT 47 07/22/2013 1354   BILITOT 1.1 03/13/2014 1510   BILITOT 0.64 07/22/2013 1354       Impression and Plan:  80 year old gentleman with the following issues:   1. Malignant melanoma initially diagnosed in 2012 and found to have a T2b lesion with the depth of invasion of 1.75 mm and a Clark's level IV. No ulcerations or vascular invasion was noted and was treated with a wide excision and a sentinel lymph node biopsy that was negative. He had a punch biopsy of a lesion in his left elbow which showed deposits of atypical melanocytes. He is status post reexcision of his melanoma lesion done on 08/23/2013. The pathology reveals area of malignant melanoma with negative resection margins.  His PET CT scan in September of 2014 did not show any evidence of metastatic disease. His clinical visit and examination today did not suggest that either. I will continue to monitor him clinically for any suggestion of systemic relapse. He continues to followup with dermatology as well.  2. Followup: Will be in 6 months for clinical visit sooner if needed to.   Arthur Portela, MD 5/12/201510:51 AM

## 2014-03-28 NOTE — Telephone Encounter (Signed)
per pof to sch pt appt-cld & tlwd w/pt to advise time & date for the appt-will mail copy of sch

## 2014-03-30 ENCOUNTER — Ambulatory Visit (INDEPENDENT_AMBULATORY_CARE_PROVIDER_SITE_OTHER): Payer: Medicare Other | Admitting: Pharmacist Clinician (PhC)/ Clinical Pharmacy Specialist

## 2014-03-30 ENCOUNTER — Telehealth: Payer: Self-pay

## 2014-03-30 ENCOUNTER — Other Ambulatory Visit: Payer: Self-pay

## 2014-03-30 DIAGNOSIS — I4891 Unspecified atrial fibrillation: Secondary | ICD-10-CM

## 2014-03-30 LAB — POCT INR: INR: 2.3

## 2014-03-30 MED ORDER — WARFARIN SODIUM 5 MG PO TABS: ORAL_TABLET | ORAL | Status: DC

## 2014-03-30 NOTE — Telephone Encounter (Signed)
Left message for patient to call back.  Overdue for protime.  Last INR was 01/10/14.

## 2014-04-04 ENCOUNTER — Other Ambulatory Visit: Payer: Self-pay | Admitting: Internal Medicine

## 2014-04-04 ENCOUNTER — Other Ambulatory Visit: Payer: Self-pay | Admitting: Dermatology

## 2014-04-04 MED ORDER — PREDNISONE 10 MG PO TABS: 10.0000 mg | ORAL_TABLET | Freq: Every day | ORAL | Status: DC

## 2014-04-05 ENCOUNTER — Other Ambulatory Visit: Payer: Self-pay | Admitting: Internal Medicine

## 2014-04-06 ENCOUNTER — Telehealth: Payer: Self-pay | Admitting: Internal Medicine

## 2014-04-06 NOTE — Telephone Encounter (Signed)
Pt states his script stated that he needed to take 7.5mg  daily. Discussed with pt that he is supposed to still be taking 10mg  daily of Prednisone. Pt verbalized understanding.

## 2014-04-06 NOTE — Telephone Encounter (Signed)
Routed to Linda Hunt 

## 2014-04-06 NOTE — Telephone Encounter (Signed)
Reviewed with pt that he is supposed to be taking Prednisone 10mg  daily.

## 2014-04-07 ENCOUNTER — Encounter: Payer: Self-pay | Admitting: Internal Medicine

## 2014-04-13 ENCOUNTER — Other Ambulatory Visit (INDEPENDENT_AMBULATORY_CARE_PROVIDER_SITE_OTHER): Payer: Medicare Other

## 2014-04-13 ENCOUNTER — Other Ambulatory Visit: Payer: Self-pay

## 2014-04-13 DIAGNOSIS — R7989 Other specified abnormal findings of blood chemistry: Secondary | ICD-10-CM

## 2014-04-13 DIAGNOSIS — K754 Autoimmune hepatitis: Secondary | ICD-10-CM

## 2014-04-13 DIAGNOSIS — R945 Abnormal results of liver function studies: Principal | ICD-10-CM

## 2014-04-13 LAB — HEPATIC FUNCTION PANEL
ALK PHOS: 62 U/L (ref 39–117)
ALT: 83 U/L — AB (ref 0–53)
AST: 64 U/L — ABNORMAL HIGH (ref 0–37)
Albumin: 3.2 g/dL — ABNORMAL LOW (ref 3.5–5.2)
Bilirubin, Direct: 0.2 mg/dL (ref 0.0–0.3)
Total Bilirubin: 1 mg/dL (ref 0.2–1.2)
Total Protein: 6.5 g/dL (ref 6.0–8.3)

## 2014-04-28 ENCOUNTER — Other Ambulatory Visit: Payer: Self-pay | Admitting: Internal Medicine

## 2014-05-03 ENCOUNTER — Other Ambulatory Visit: Payer: Self-pay

## 2014-05-03 MED ORDER — PREDNISONE 10 MG PO TABS: 10.0000 mg | ORAL_TABLET | Freq: Every day | ORAL | Status: DC

## 2014-05-09 NOTE — Telephone Encounter (Signed)
rx Prednisone already sent by Vaughan Basta.

## 2014-05-10 ENCOUNTER — Other Ambulatory Visit (INDEPENDENT_AMBULATORY_CARE_PROVIDER_SITE_OTHER): Payer: Medicare Other

## 2014-05-10 ENCOUNTER — Telehealth: Payer: Self-pay

## 2014-05-10 DIAGNOSIS — R945 Abnormal results of liver function studies: Principal | ICD-10-CM

## 2014-05-10 DIAGNOSIS — R7989 Other specified abnormal findings of blood chemistry: Secondary | ICD-10-CM

## 2014-05-10 LAB — HEPATIC FUNCTION PANEL
ALK PHOS: 81 U/L (ref 39–117)
ALT: 83 U/L — ABNORMAL HIGH (ref 0–53)
AST: 67 U/L — AB (ref 0–37)
Albumin: 3.4 g/dL — ABNORMAL LOW (ref 3.5–5.2)
BILIRUBIN TOTAL: 0.9 mg/dL (ref 0.2–1.2)
Bilirubin, Direct: 0.2 mg/dL (ref 0.0–0.3)
Total Protein: 6.6 g/dL (ref 6.0–8.3)

## 2014-05-10 NOTE — Telephone Encounter (Signed)
Pt aware and states he will try to come today.

## 2014-05-10 NOTE — Telephone Encounter (Signed)
Message copied by Algernon Huxley on Wed May 10, 2014  1:39 PM ------      Message from: HUNT, LINDA R      Created: Thu Apr 13, 2014  3:32 PM      Regarding: LFT's       Needs LFT's done, order in. ------

## 2014-05-11 ENCOUNTER — Other Ambulatory Visit: Payer: Self-pay

## 2014-05-11 ENCOUNTER — Ambulatory Visit (INDEPENDENT_AMBULATORY_CARE_PROVIDER_SITE_OTHER): Payer: Medicare Other | Admitting: *Deleted

## 2014-05-11 DIAGNOSIS — R945 Abnormal results of liver function studies: Principal | ICD-10-CM

## 2014-05-11 DIAGNOSIS — R7989 Other specified abnormal findings of blood chemistry: Secondary | ICD-10-CM

## 2014-05-11 DIAGNOSIS — I4891 Unspecified atrial fibrillation: Secondary | ICD-10-CM

## 2014-05-11 LAB — POCT INR: INR: 4.1

## 2014-05-25 ENCOUNTER — Ambulatory Visit (INDEPENDENT_AMBULATORY_CARE_PROVIDER_SITE_OTHER): Payer: Medicare (Managed Care) | Admitting: *Deleted

## 2014-05-25 DIAGNOSIS — I4891 Unspecified atrial fibrillation: Secondary | ICD-10-CM

## 2014-05-25 LAB — POCT INR: INR: 3.3

## 2014-06-12 ENCOUNTER — Ambulatory Visit (INDEPENDENT_AMBULATORY_CARE_PROVIDER_SITE_OTHER): Payer: Medicare (Managed Care)

## 2014-06-12 DIAGNOSIS — I4891 Unspecified atrial fibrillation: Secondary | ICD-10-CM

## 2014-06-12 LAB — POCT INR: INR: 1.7

## 2014-06-19 ENCOUNTER — Other Ambulatory Visit (INDEPENDENT_AMBULATORY_CARE_PROVIDER_SITE_OTHER): Payer: Medicare (Managed Care)

## 2014-06-19 ENCOUNTER — Other Ambulatory Visit: Payer: Self-pay

## 2014-06-19 ENCOUNTER — Telehealth: Payer: Self-pay

## 2014-06-19 DIAGNOSIS — K754 Autoimmune hepatitis: Secondary | ICD-10-CM

## 2014-06-19 DIAGNOSIS — R945 Abnormal results of liver function studies: Principal | ICD-10-CM

## 2014-06-19 DIAGNOSIS — R7989 Other specified abnormal findings of blood chemistry: Secondary | ICD-10-CM

## 2014-06-19 LAB — HEPATIC FUNCTION PANEL
ALK PHOS: 64 U/L (ref 39–117)
ALT: 43 U/L (ref 0–53)
AST: 43 U/L — AB (ref 0–37)
Albumin: 3.2 g/dL — ABNORMAL LOW (ref 3.5–5.2)
BILIRUBIN TOTAL: 1 mg/dL (ref 0.2–1.2)
Bilirubin, Direct: 0.2 mg/dL (ref 0.0–0.3)
Total Protein: 6.6 g/dL (ref 6.0–8.3)

## 2014-06-19 NOTE — Telephone Encounter (Signed)
Message copied by Algernon Huxley on Mon Jun 19, 2014  8:47 AM ------      Message from: HUNT, LINDA R      Created: Thu May 11, 2014 10:52 AM      Regarding: Labs       Needs LFT's order in. ------

## 2014-06-19 NOTE — Telephone Encounter (Signed)
Pt aware.

## 2014-06-26 ENCOUNTER — Ambulatory Visit (INDEPENDENT_AMBULATORY_CARE_PROVIDER_SITE_OTHER): Payer: Medicare Other | Admitting: Pharmacist

## 2014-06-26 DIAGNOSIS — I4891 Unspecified atrial fibrillation: Secondary | ICD-10-CM

## 2014-06-26 LAB — POCT INR: INR: 2.9

## 2014-06-29 ENCOUNTER — Other Ambulatory Visit: Payer: Self-pay | Admitting: *Deleted

## 2014-06-29 MED ORDER — METOPROLOL TARTRATE 25 MG PO TABS: ORAL_TABLET | ORAL | Status: DC

## 2014-07-04 ENCOUNTER — Other Ambulatory Visit: Payer: Self-pay | Admitting: Dermatology

## 2014-07-09 ENCOUNTER — Encounter: Payer: Self-pay | Admitting: Internal Medicine

## 2014-07-17 ENCOUNTER — Ambulatory Visit (INDEPENDENT_AMBULATORY_CARE_PROVIDER_SITE_OTHER): Payer: Medicare Other | Admitting: Pharmacist

## 2014-07-17 DIAGNOSIS — I4891 Unspecified atrial fibrillation: Secondary | ICD-10-CM

## 2014-07-17 LAB — POCT INR: INR: 2.5

## 2014-07-29 ENCOUNTER — Telehealth: Payer: Self-pay

## 2014-07-29 NOTE — Telephone Encounter (Signed)
LVM for pt to call back.   RE: schedule AWV

## 2014-08-14 ENCOUNTER — Ambulatory Visit (INDEPENDENT_AMBULATORY_CARE_PROVIDER_SITE_OTHER): Payer: Medicare Other | Admitting: Pharmacist

## 2014-08-14 DIAGNOSIS — I4891 Unspecified atrial fibrillation: Secondary | ICD-10-CM

## 2014-08-14 LAB — POCT INR: INR: 2.1

## 2014-08-21 ENCOUNTER — Other Ambulatory Visit (INDEPENDENT_AMBULATORY_CARE_PROVIDER_SITE_OTHER): Payer: Medicare Other

## 2014-08-21 ENCOUNTER — Telehealth: Payer: Self-pay

## 2014-08-21 DIAGNOSIS — K754 Autoimmune hepatitis: Secondary | ICD-10-CM

## 2014-08-21 LAB — HEPATIC FUNCTION PANEL
ALT: 64 U/L — ABNORMAL HIGH (ref 0–53)
AST: 57 U/L — ABNORMAL HIGH (ref 0–37)
Albumin: 3.3 g/dL — ABNORMAL LOW (ref 3.5–5.2)
Alkaline Phosphatase: 68 U/L (ref 39–117)
Bilirubin, Direct: 0.1 mg/dL (ref 0.0–0.3)
Total Bilirubin: 0.8 mg/dL (ref 0.2–1.2)
Total Protein: 6.7 g/dL (ref 6.0–8.3)

## 2014-08-21 NOTE — Telephone Encounter (Signed)
Message copied by Algernon Huxley on Mon Aug 21, 2014 10:47 AM ------      Message from: Knolan Simien, Virginia R      Created: Mon Jun 19, 2014  2:57 PM      Regarding: LFT's       Pt needs LFT's in Oct. ------

## 2014-08-21 NOTE — Telephone Encounter (Signed)
Pt aware and states he will come today for labs.

## 2014-08-22 ENCOUNTER — Other Ambulatory Visit: Payer: Self-pay

## 2014-08-22 DIAGNOSIS — R945 Abnormal results of liver function studies: Principal | ICD-10-CM

## 2014-08-22 DIAGNOSIS — R7989 Other specified abnormal findings of blood chemistry: Secondary | ICD-10-CM

## 2014-09-06 ENCOUNTER — Telehealth: Payer: Self-pay

## 2014-09-06 MED ORDER — WARFARIN SODIUM 5 MG PO TABS: ORAL_TABLET | ORAL | Status: DC

## 2014-09-06 NOTE — Telephone Encounter (Signed)
Rx sent per pt request 

## 2014-09-15 ENCOUNTER — Telehealth: Payer: Self-pay

## 2014-09-15 ENCOUNTER — Other Ambulatory Visit: Payer: Self-pay | Admitting: *Deleted

## 2014-09-15 MED ORDER — WARFARIN SODIUM 5 MG PO TABS: ORAL_TABLET | ORAL | Status: DC

## 2014-09-15 NOTE — Telephone Encounter (Signed)
Rx sent 

## 2014-09-18 ENCOUNTER — Ambulatory Visit (INDEPENDENT_AMBULATORY_CARE_PROVIDER_SITE_OTHER): Payer: Medicare Other | Admitting: Pharmacist

## 2014-09-18 DIAGNOSIS — I4891 Unspecified atrial fibrillation: Secondary | ICD-10-CM

## 2014-09-18 LAB — POCT INR: INR: 1.4

## 2014-09-23 ENCOUNTER — Other Ambulatory Visit: Payer: Self-pay | Admitting: Internal Medicine

## 2014-09-26 ENCOUNTER — Telehealth: Payer: Self-pay | Admitting: Oncology

## 2014-09-26 NOTE — Telephone Encounter (Signed)
Pt called recd msg from answering service stating pt had apt advised of apt and he states he will be out of town, pt confirmed r/s MD visit.... KJ

## 2014-09-28 ENCOUNTER — Ambulatory Visit: Payer: Medicare Other | Admitting: Oncology

## 2014-09-29 ENCOUNTER — Telehealth: Payer: Self-pay | Admitting: *Deleted

## 2014-09-29 MED ORDER — WARFARIN SODIUM 5 MG PO TABS: ORAL_TABLET | ORAL | Status: DC

## 2014-09-29 NOTE — Telephone Encounter (Signed)
Patient is in [place] and he left his coumadin at home. [facility] [place] [place] which I have already put on his chart. Thanks, MI

## 2014-10-02 ENCOUNTER — Other Ambulatory Visit: Payer: Self-pay | Admitting: *Deleted

## 2014-10-02 MED ORDER — METOPROLOL TARTRATE 25 MG PO TABS: ORAL_TABLET | ORAL | Status: DC

## 2014-10-03 ENCOUNTER — Ambulatory Visit (INDEPENDENT_AMBULATORY_CARE_PROVIDER_SITE_OTHER): Payer: Medicare Other

## 2014-10-03 ENCOUNTER — Other Ambulatory Visit: Payer: Self-pay | Admitting: Dermatology

## 2014-10-03 DIAGNOSIS — I4891 Unspecified atrial fibrillation: Secondary | ICD-10-CM

## 2014-10-03 LAB — POCT INR: INR: 2

## 2014-10-10 ENCOUNTER — Ambulatory Visit (INDEPENDENT_AMBULATORY_CARE_PROVIDER_SITE_OTHER): Payer: Medicare Other | Admitting: Cardiovascular Disease

## 2014-10-10 ENCOUNTER — Encounter: Payer: Self-pay | Admitting: Cardiovascular Disease

## 2014-10-10 VITALS — BP 132/78 | HR 61 | Ht 71.0 in | Wt 205.4 lb

## 2014-10-10 DIAGNOSIS — I4891 Unspecified atrial fibrillation: Secondary | ICD-10-CM

## 2014-10-10 DIAGNOSIS — E785 Hyperlipidemia, unspecified: Secondary | ICD-10-CM

## 2014-10-10 DIAGNOSIS — I10 Essential (primary) hypertension: Secondary | ICD-10-CM

## 2014-10-10 NOTE — Patient Instructions (Signed)
Your physician wants you to follow-up in: 1 YEAR with Dr Cooper.  You will receive a reminder letter in the mail two months in advance. If you don't receive a letter, please call our office to schedule the follow-up appointment.  Your physician recommends that you continue on your current medications as directed. Please refer to the Current Medication list given to you today.  

## 2014-10-10 NOTE — Progress Notes (Signed)
Background: The patient is followed for atrial fibrillation on chronic anticoagulation with warfarin. He also has coronary artery disease and with history of anteroseptal and inferior MIs in the 1990s. He underwent stenting of the LAD in 2000. The patient also has chronic leg swelling without good response to conservative measures utilizing compression stockings and leg elevation.  HPI:  80 year old gentleman presenting for follow-up evaluation. He had to undergo a right second toe amputation and has complained of leg weakness ever since that time. He has been working back up on the treadmill and has gone from 5 up to now 15-20 minutes without stopping to rest. He denies chest pain, shortness of breath, orthopnea, or PND.   Reports no bleeding problems on chronic anticoagulation with warfarin. He's had multiple skin surgeries over the past few years for treatment of melanoma and basal cell carcinomas.  Outpatient Encounter Prescriptions as of 10/10/2014  Medication Sig  . losartan (COZAAR) 50 MG tablet Take 1 tablet (50 mg total) by mouth daily.  . metoprolol tartrate (LOPRESSOR) 25 MG tablet TAKE 1 TABLET DAILY  . predniSONE (DELTASONE) 10 MG tablet TAKE 1 TABLET (10 MG TOTAL) BY MOUTH DAILY.  Marland Kitchen warfarin (COUMADIN) 5 MG tablet Take as directed by Coumadin Clinic.    Allergies  Allergen Reactions  . Penicillins Hives    Past Medical History  Diagnosis Date  . Coronary artery disease     MI, atherectomy 1993  . Hyperlipidemia   . Arrhythmia     1995  . Atrial fibrillation   . History of transient ischemic attack (TIA) 2005    double vision  . Hypertension   . History of gallstones   . 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 Eczema   . Elevated LFTs   . Skin cancer   . DDD (degenerative disc disease)   . Pneumonia     long time ago    family history includes Heart attack (age of onset: 6) in his mother; Pneumonia in his father.   ROS: Negative except as per HPI  BP  132/78 mmHg  Pulse 61  Ht 5\' 11"  (1.803 m)  Wt 205 lb 6.4 oz (93.169 kg)  BMI 28.66 kg/m2  PHYSICAL EXAM: Pt is alert and oriented, elderly male in NAD HEENT: normal Neck: JVP - normal, carotids 2+= without bruits Lungs: CTA bilaterally CV: irregularly irregular without murmur or gallop Abd: soft, NT, Positive BS, no hepatomegaly Ext: 2+ pretibial edema bilaterally, distal pulses intact and equal Skin: multiple skin lesions noted  EKG:  Atrial fibrillation 61 bpm, ST and T wave abnormality consider digitalis effect or lateral ischemia.  ASSESSMENT AND PLAN: 1. Coronary atherosclerosis, native vessel, without symptoms of angina: no antiplatelet Rx because of chronic anticoagulation.  2. Essential hypertension: Blood pressure is well controlled on a combination of losartan and metoprolol  3. Permanent atrial fibrillation: The patient has done well over the long-term with the strategy of rate control and anticoagulation. He reports no bleeding problems on chronic warfarin.  4. Hyperlipidemia:  Lipid Panel     Component Value Date/Time   CHOL 133 12/09/2012 0837   TRIG 62.0 12/09/2012 0837   HDL 39.30 12/09/2012 0837   CHOLHDL 3 12/09/2012 0837   VLDL 12.4 12/09/2012 0837   LDLCALC 81 12/09/2012 0837   5. Leg edema: chronic issue, suspect venous insufficiency. Continue compression/elevation.  For follow-up I will see him back in one year unless problems arise.  Sherren Mocha, MD 10/10/2014 11:39 AM

## 2014-10-13 ENCOUNTER — Other Ambulatory Visit: Payer: Self-pay | Admitting: Cardiovascular Disease

## 2014-10-19 ENCOUNTER — Telehealth: Payer: Self-pay | Admitting: Oncology

## 2014-10-19 ENCOUNTER — Ambulatory Visit (HOSPITAL_BASED_OUTPATIENT_CLINIC_OR_DEPARTMENT_OTHER): Payer: Medicare Other | Admitting: Oncology

## 2014-10-19 VITALS — BP 145/66 | HR 53 | Temp 95.0°F | Resp 19 | Wt 203.8 lb

## 2014-10-19 DIAGNOSIS — C439 Malignant melanoma of skin, unspecified: Secondary | ICD-10-CM

## 2014-10-19 DIAGNOSIS — C4362 Malignant melanoma of left upper limb, including shoulder: Secondary | ICD-10-CM

## 2014-10-19 NOTE — Telephone Encounter (Signed)
Pt confirmed labs/ov per 12/03 POF, gave pt AVS... KJ °

## 2014-10-19 NOTE — Progress Notes (Signed)
Hematology and Oncology Follow Up Visit  Arthur [name] [id number] 1925/07/17 80 y.o. 10/19/2014 8:49 AM Arthur [name], MDHopper, Arthur Penna, MD   Principle Diagnosis: 80 year old gentleman diagnosed with Malignant melanoma initially diagnosed in 2012 and found to have a T2b lesion with the depth of invasion of 1.75 mm and a Clark's level IV. He developed regional relapse in October of 2014.    Prior Therapy: 1. In May of 2012, he underwent wide excision and sentinel lymph node biopsy done by Dr. Hassell Done which showed no lymph node involvement and no residual malignant melanoma.  2. he is status post Re-excision of site of melanoma satellite lesion of left forearm. This was done on 08/23/2013.   Current therapy: Observation and surveillance.  Interim History:  Arthur [name] presents today for a followup visit. Since the last visit he has been doing well. 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 He is not reporting any new symptoms. He is continued to perform activities of daily living without any hindrance or decline. He is continued to be on low-dose prednisone due to autoimmune hepatitis to his increased recently to 10 mg daily. He has not had any recent illnesses or hospitalizations. He does not report any headaches or blurry vision or double vision. Does not report any chest pain or shortness of breath. As that report any cough or hemoptysis. Does not report any nausea or vomiting or abdominal pain. Did not report any hematochezia or melena. Did not report any frequency urgency or hesitancy. Does not report any musculoskeletal complaints. Her recent rashes or lesions. The rest of the review of system is unremarkable.   Medications: I have reviewed the patient's current medications.  Current Outpatient Prescriptions  Medication Sig Dispense Refill  . losartan (COZAAR) 50 MG tablet TAKE 1 TABLET (50 MG TOTAL) BY MOUTH DAILY. 90 tablet 2  . metoprolol tartrate (LOPRESSOR) 25 MG  tablet TAKE 1 TABLET DAILY 90 tablet 1  . predniSONE (DELTASONE) 10 MG tablet TAKE 1 TABLET (10 MG TOTAL) BY MOUTH DAILY. 30 tablet 3  . warfarin (COUMADIN) 5 MG tablet Take as directed by [facility]. 3 tablet 0   No current facility-administered medications for this visit.     Allergies:  Allergies  Allergen Reactions  . Penicillins Hives       Physical Exam: Blood pressure 145/66, pulse 53, temperature 95 F (35 C), temperature source Oral, resp. rate 19, weight 203 lb 12.8 oz (92.443 kg). ECOG: 1 General appearance: alert awake not in any distress. Head: Normocephalic, without any masses or lesions. Neck: no adenopathy, or masses. Lymph nodes: Cervical, supraclavicular, and axillary nodes normal.no inguinal adenopathy noted either. Heart:regular rate and rhythm, S1, S2 normal, no murmur, click, rub or gallop Lung:chest clear, no wheezing, rales, normal symmetric air entry Abdomin: soft, non-tender, without masses or organomegaly  EXT:no erythema, induration, or nodules Skin showed no rashes or lesions. I could not appreciate any other melanoma lesions.  Lab Results: Lab Results  Component Value Date   WBC 7.4 08/16/2013   HGB 14.4 08/16/2013   HCT 43.7 08/16/2013   MCV 98.6 08/16/2013   PLT 153 08/16/2013     Chemistry      Component Value Date/Time   NA 137 08/16/2013 1120   NA 138 07/22/2013 1354   K 4.9 08/16/2013 1120   K 5.5* 07/22/2013 1354   CL 101 08/16/2013 1120   CO2 27 08/16/2013 1120   CO2 26 07/22/2013 1354  BUN 26* 08/16/2013 1120   BUN 23.6 07/22/2013 1354   CREATININE 1.42* 08/16/2013 1120   CREATININE 1.4* 07/22/2013 1354   CREATININE 1.51* 04/20/2013 1639      Component Value Date/Time   CALCIUM 9.2 08/16/2013 1120   CALCIUM 9.1 07/22/2013 1354   ALKPHOS 68 08/21/2014 1351   ALKPHOS 63 07/22/2013 1354   AST 57* 08/21/2014 1351   AST 39* 07/22/2013 1354   ALT 64* 08/21/2014 1351   ALT 47 07/22/2013 1354   BILITOT 0.8  08/21/2014 1351   BILITOT 0.64 07/22/2013 1354       Impression and Plan:  80 year old gentleman with the following issues:   1. Malignant melanoma initially diagnosed in 2012 and found to have a T2b lesion with the depth of invasion of 1.75 mm and a Clark's level IV. No ulcerations or vascular invasion was noted and was treated with a wide excision and a sentinel lymph node biopsy that was negative. He had a punch biopsy of a lesion in his left elbow which showed deposits of atypical melanocytes. He is status post reexcision of his melanoma lesion done on 08/23/2013. The pathology reveals area of malignant melanoma with negative resection margins.  His PET CT scan in September of 2014 did not show any evidence of metastatic disease. His clinical visit and examination today did not suggest that either.  I will repeat imaging studies before the next visit to rule out systemic relapse.  2. Followup: Will be in 6 months for clinical visit sooner if needed to.   [facility], MD 12/3/20158:49 AM

## 2014-10-23 ENCOUNTER — Telehealth: Payer: Self-pay

## 2014-10-23 ENCOUNTER — Other Ambulatory Visit (INDEPENDENT_AMBULATORY_CARE_PROVIDER_SITE_OTHER): Payer: Medicare Other

## 2014-10-23 DIAGNOSIS — R7989 Other specified abnormal findings of blood chemistry: Secondary | ICD-10-CM

## 2014-10-23 DIAGNOSIS — R945 Abnormal results of liver function studies: Principal | ICD-10-CM

## 2014-10-23 LAB — HEPATIC FUNCTION PANEL
ALT: 150 U/L — ABNORMAL HIGH (ref 0–53)
AST: 112 U/L — AB (ref 0–37)
Albumin: 3.4 g/dL — ABNORMAL LOW (ref 3.5–5.2)
Alkaline Phosphatase: 76 U/L (ref 39–117)
Bilirubin, Direct: 0.2 mg/dL (ref 0.0–0.3)
TOTAL PROTEIN: 6.7 g/dL (ref 6.0–8.3)
Total Bilirubin: 1.1 mg/dL (ref 0.2–1.2)

## 2014-10-23 NOTE — Telephone Encounter (Signed)
-----   Message from Maury Dus, RN sent at 08/22/2014  9:34 AM EDT ----- Regarding: LFT's Pt needs LFTS, order in

## 2014-10-23 NOTE — Telephone Encounter (Signed)
Pt aware.

## 2014-10-24 ENCOUNTER — Ambulatory Visit (INDEPENDENT_AMBULATORY_CARE_PROVIDER_SITE_OTHER): Payer: Medicare Other | Admitting: *Deleted

## 2014-10-24 DIAGNOSIS — I4891 Unspecified atrial fibrillation: Secondary | ICD-10-CM

## 2014-10-24 LAB — POCT INR: INR: 2.6

## 2014-11-20 ENCOUNTER — Telehealth: Payer: Self-pay

## 2014-11-20 ENCOUNTER — Other Ambulatory Visit (INDEPENDENT_AMBULATORY_CARE_PROVIDER_SITE_OTHER): Payer: Medicare Other

## 2014-11-20 DIAGNOSIS — R7989 Other specified abnormal findings of blood chemistry: Secondary | ICD-10-CM

## 2014-11-20 DIAGNOSIS — R945 Abnormal results of liver function studies: Principal | ICD-10-CM

## 2014-11-20 LAB — HEPATIC FUNCTION PANEL
ALBUMIN: 3.4 g/dL — AB (ref 3.5–5.2)
ALT: 142 U/L — AB (ref 0–53)
AST: 107 U/L — AB (ref 0–37)
Alkaline Phosphatase: 75 U/L (ref 39–117)
Bilirubin, Direct: 0.2 mg/dL (ref 0.0–0.3)
Total Bilirubin: 1.1 mg/dL (ref 0.2–1.2)
Total Protein: 6.6 g/dL (ref 6.0–8.3)

## 2014-11-20 NOTE — Telephone Encounter (Signed)
Pt aware.

## 2014-11-20 NOTE — Telephone Encounter (Signed)
-----   Message from Maury Dus, RN sent at 10/24/2014  1:50 PM EST ----- Regarding: LFT's Pt needs LFT's in 4 weeks, orders in epic.

## 2014-11-21 ENCOUNTER — Other Ambulatory Visit: Payer: Self-pay

## 2014-11-21 ENCOUNTER — Ambulatory Visit (INDEPENDENT_AMBULATORY_CARE_PROVIDER_SITE_OTHER): Payer: Medicare Other

## 2014-11-21 DIAGNOSIS — I4891 Unspecified atrial fibrillation: Secondary | ICD-10-CM

## 2014-11-21 DIAGNOSIS — R945 Abnormal results of liver function studies: Principal | ICD-10-CM

## 2014-11-21 DIAGNOSIS — R7989 Other specified abnormal findings of blood chemistry: Secondary | ICD-10-CM

## 2014-11-21 LAB — POCT INR: INR: 2

## 2014-12-12 ENCOUNTER — Telehealth: Payer: Self-pay | Admitting: Pharmacist

## 2014-12-12 ENCOUNTER — Telehealth: Payer: Self-pay | Admitting: Internal Medicine

## 2014-12-12 ENCOUNTER — Telehealth: Payer: Self-pay

## 2014-12-12 ENCOUNTER — Ambulatory Visit (INDEPENDENT_AMBULATORY_CARE_PROVIDER_SITE_OTHER): Payer: Medicare Other | Admitting: Pharmacist

## 2014-12-12 ENCOUNTER — Other Ambulatory Visit (INDEPENDENT_AMBULATORY_CARE_PROVIDER_SITE_OTHER): Payer: Medicare Other

## 2014-12-12 DIAGNOSIS — Z23 Encounter for immunization: Secondary | ICD-10-CM

## 2014-12-12 DIAGNOSIS — R945 Abnormal results of liver function studies: Principal | ICD-10-CM

## 2014-12-12 DIAGNOSIS — R7989 Other specified abnormal findings of blood chemistry: Secondary | ICD-10-CM

## 2014-12-12 DIAGNOSIS — I4891 Unspecified atrial fibrillation: Secondary | ICD-10-CM

## 2014-12-12 LAB — HEPATIC FUNCTION PANEL
ALT: 81 U/L — ABNORMAL HIGH (ref 0–53)
AST: 57 U/L — ABNORMAL HIGH (ref 0–37)
Albumin: 3.3 g/dL — ABNORMAL LOW (ref 3.5–5.2)
Alkaline Phosphatase: 85 U/L (ref 39–117)
BILIRUBIN TOTAL: 0.8 mg/dL (ref 0.2–1.2)
Bilirubin, Direct: 0.2 mg/dL (ref 0.0–0.3)
Total Protein: 6.7 g/dL (ref 6.0–8.3)

## 2014-12-12 LAB — POCT INR: INR: 3.8

## 2014-12-12 NOTE — Telephone Encounter (Signed)
-----   Message from Maury Dus, RN sent at 11/21/2014  9:40 AM EST ----- Regarding: LFTs Pt needs LFT's done

## 2014-12-12 NOTE — Telephone Encounter (Signed)
Spoke with pt he called to inform us that he forgot to tell us about med change during visit today. He states Prednisone was increased from 10mg s to 15mg s approx 10 days ago. He had blood drawn today and will know about his Prednisone dosing tomorrow and will call and let us know tomorrow.

## 2014-12-12 NOTE — Telephone Encounter (Signed)
New Message  Pt was asked about his Prednisone- it was changed from 10mg  to 15mg . Pt wanted to speak w/ coumadin rep. Pleae call back and discuss.

## 2014-12-12 NOTE — Telephone Encounter (Signed)
Pt aware.

## 2014-12-13 ENCOUNTER — Ambulatory Visit: Payer: Medicare Other

## 2014-12-13 MED ORDER — PREDNISONE 10 MG PO TABS: 15.0000 mg | ORAL_TABLET | Freq: Every day | ORAL | Status: DC

## 2014-12-13 NOTE — Telephone Encounter (Signed)
Pt called back and stated that he needs a refill on the prednisone. New script with 15mg  daily instructions sent to pharmacy.

## 2014-12-13 NOTE — Telephone Encounter (Signed)
Telephoned pt and he states that he will be on Prednisone 15mg s QD for the next 4 weeks, thus I instructed pt to skip today and instructed yesterday and then change dose to 1/2 pill QD except 1 pill on M&F. Recheck next Thursday in [place]. Pt states he will call on Monday, because he is leaving tomorrow for [place]. And give [place] the hospital name and fax number to send order.

## 2014-12-13 NOTE — Telephone Encounter (Signed)
The increased dose of prednisone probably explains why his liver tests have improved. Given that, probably best to stay on 15 for an additional 4 weeks and recheck liver tests. If okay, we can taper from there. Thanks for the clarification

## 2014-12-13 NOTE — Telephone Encounter (Signed)
Left message for pt to call back  °

## 2014-12-13 NOTE — Telephone Encounter (Signed)
Follow up     Pt states no one had called him back regarding new dosage for coumadin---his PT/INR is high but he has been taking 15mg  of prednisone daily and did not tell anyone about this.  Please call

## 2014-12-13 NOTE — Telephone Encounter (Signed)
Discussed labs results with pt and he is aware. Pt misunderstood last directions and states he has been taking 15mg  of prednisone. Pt would love to decrease to the 10mg  dose. Please advise.

## 2014-12-13 NOTE — Telephone Encounter (Signed)
Spoke with pt and he is aware. He will be in [place] for the next several weeks. Pt will return March 1st. Will call pt and remind him March 1 to come for labs.

## 2014-12-18 NOTE — Telephone Encounter (Signed)
Follow Up Pt told to get info from [facility] in [place] for lab order:  [facility] phone number [phone]; [phone] for lab order. Please call back and discuss.

## 2014-12-18 NOTE — Telephone Encounter (Signed)
LMOM for pt that we sent order for lab draw on 12/21/14 and PRN for 1 mth the length of time he will be in [place]. Pt is in lab book.

## 2014-12-21 ENCOUNTER — Ambulatory Visit (INDEPENDENT_AMBULATORY_CARE_PROVIDER_SITE_OTHER): Payer: Medicare Other | Admitting: Cardiology

## 2014-12-21 DIAGNOSIS — I4891 Unspecified atrial fibrillation: Secondary | ICD-10-CM

## 2014-12-21 LAB — PROTIME-INR: INR: 1.6 — AB (ref 0.9–1.1)

## 2015-01-01 ENCOUNTER — Telehealth: Payer: Self-pay | Admitting: Cardiovascular Disease

## 2015-01-01 LAB — PROTIME-INR: INR: 1.6 — AB (ref 0.9–1.1)

## 2015-01-01 NOTE — Telephone Encounter (Signed)
Spoke with pt.  Orders faxed to number listed.

## 2015-01-01 NOTE — Telephone Encounter (Signed)
New message     Pt is in [place] and need a coumadin ck.  Please call [phone] to give the order to get a pt/inr and then call pt to let him know it has been done.

## 2015-01-02 ENCOUNTER — Telehealth: Payer: Self-pay | Admitting: *Deleted

## 2015-01-02 ENCOUNTER — Telehealth: Payer: Self-pay | Admitting: Cardiovascular Disease

## 2015-01-02 ENCOUNTER — Ambulatory Visit (INDEPENDENT_AMBULATORY_CARE_PROVIDER_SITE_OTHER): Payer: Medicare Other | Admitting: Internal Medicine

## 2015-01-02 DIAGNOSIS — I4891 Unspecified atrial fibrillation: Secondary | ICD-10-CM

## 2015-01-02 NOTE — Telephone Encounter (Signed)
See Anti-coag note  

## 2015-01-02 NOTE — Telephone Encounter (Signed)
Patient called to state he had his labs drawn yesterday while in [place].  He inquired if we have received the results to date and informed him that we have not and once we do we will call him to dose his coumadin. He verbalized understanding.

## 2015-01-02 NOTE — Telephone Encounter (Signed)
New message      Pt want [place] to get his pt/inr results from [facility].  He only has the fax number--[phone]

## 2015-01-11 ENCOUNTER — Other Ambulatory Visit: Payer: Self-pay

## 2015-01-11 MED ORDER — METOPROLOL TARTRATE 25 MG PO TABS: ORAL_TABLET | ORAL | Status: DC

## 2015-01-16 ENCOUNTER — Ambulatory Visit (INDEPENDENT_AMBULATORY_CARE_PROVIDER_SITE_OTHER): Payer: Medicare Other | Admitting: *Deleted

## 2015-01-16 DIAGNOSIS — I4891 Unspecified atrial fibrillation: Secondary | ICD-10-CM

## 2015-01-16 LAB — POCT INR: INR: 2.6

## 2015-01-29 ENCOUNTER — Telehealth: Payer: Self-pay | Admitting: Cardiovascular Disease

## 2015-01-29 NOTE — Telephone Encounter (Signed)
Spoke with pt in regards to flu. Pt was concerned that his daughter's mother in law had went to urgent care and was diagnosed with the flu. Pt going to dinner tonight with daughter and family members and was concerned with catching the flu. Pt states that the mother in law is not going to be in attendance. Informed pt that he would be fine to go to dinner. Informed pt to avoid contact with the person with the flu and to increase hand washing to help prevent getting the flu. Pt verbalized understanding and was in agreement with this plan.

## 2015-01-29 NOTE — Telephone Encounter (Signed)
New Msg        Pt calling, states that he is in [place] and his daughter's in-laws have the flu.  Pt concerned with catching it and request call from nurse to discuss.  Please call.

## 2015-02-02 ENCOUNTER — Telehealth: Payer: Self-pay | Admitting: Internal Medicine

## 2015-02-02 NOTE — Telephone Encounter (Signed)
[facility] [address] [facility] [address] in [place]

## 2015-02-19 ENCOUNTER — Telehealth: Payer: Self-pay

## 2015-02-19 MED ORDER — WARFARIN SODIUM 5 MG PO TABS: ORAL_TABLET | ORAL | Status: DC

## 2015-02-19 NOTE — Telephone Encounter (Signed)
[facility]ill [facility] to [facility] per pt request.

## 2015-02-22 ENCOUNTER — Ambulatory Visit (INDEPENDENT_AMBULATORY_CARE_PROVIDER_SITE_OTHER): Payer: Medicare Other | Admitting: *Deleted

## 2015-02-22 ENCOUNTER — Telehealth: Payer: Self-pay | Admitting: Internal Medicine

## 2015-02-22 ENCOUNTER — Other Ambulatory Visit: Payer: Self-pay

## 2015-02-22 ENCOUNTER — Other Ambulatory Visit (INDEPENDENT_AMBULATORY_CARE_PROVIDER_SITE_OTHER): Payer: Medicare Other

## 2015-02-22 DIAGNOSIS — K754 Autoimmune hepatitis: Secondary | ICD-10-CM | POA: Diagnosis not present

## 2015-02-22 DIAGNOSIS — I4891 Unspecified atrial fibrillation: Secondary | ICD-10-CM | POA: Diagnosis not present

## 2015-02-22 LAB — HEPATIC FUNCTION PANEL
ALBUMIN: 3.5 g/dL (ref 3.5–5.2)
ALT: 88 U/L — ABNORMAL HIGH (ref 0–53)
AST: 67 U/L — ABNORMAL HIGH (ref 0–37)
Alkaline Phosphatase: 72 U/L (ref 39–117)
Bilirubin, Direct: 0.2 mg/dL (ref 0.0–0.3)
TOTAL PROTEIN: 6.9 g/dL (ref 6.0–8.3)
Total Bilirubin: 1 mg/dL (ref 0.2–1.2)

## 2015-02-22 LAB — POCT INR: INR: 2.8

## 2015-02-22 NOTE — Telephone Encounter (Signed)
Pt has been in [place] and wants to know if he needs labs. Discussed with pt that he does need labs, he will come tomorrow. Order in epic.

## 2015-02-23 ENCOUNTER — Other Ambulatory Visit: Payer: Self-pay

## 2015-02-23 DIAGNOSIS — R945 Abnormal results of liver function studies: Principal | ICD-10-CM

## 2015-02-23 DIAGNOSIS — R7989 Other specified abnormal findings of blood chemistry: Secondary | ICD-10-CM

## 2015-03-13 ENCOUNTER — Telehealth: Payer: Self-pay | Admitting: Internal Medicine

## 2015-03-13 MED ORDER — PREDNISONE 10 MG PO TABS: 20.0000 mg | ORAL_TABLET | Freq: Every day | ORAL | Status: DC

## 2015-03-13 NOTE — Telephone Encounter (Signed)
Script sent to pharmacy and pt aware. 

## 2015-03-14 ENCOUNTER — Telehealth: Payer: Self-pay | Admitting: Internal Medicine

## 2015-03-14 NOTE — Telephone Encounter (Signed)
Spoke with pt and he states medicare was not paying for his prednisone. [facility] and will be 19.00 due to the increased quantity sent it. Pt aware.

## 2015-03-29 ENCOUNTER — Ambulatory Visit (INDEPENDENT_AMBULATORY_CARE_PROVIDER_SITE_OTHER): Payer: Medicare Other

## 2015-03-29 DIAGNOSIS — I4891 Unspecified atrial fibrillation: Secondary | ICD-10-CM

## 2015-03-29 LAB — POCT INR: INR: 3

## 2015-04-04 ENCOUNTER — Other Ambulatory Visit (INDEPENDENT_AMBULATORY_CARE_PROVIDER_SITE_OTHER): Payer: Medicare Other

## 2015-04-04 ENCOUNTER — Telehealth: Payer: Self-pay

## 2015-04-04 ENCOUNTER — Telehealth: Payer: Self-pay | Admitting: Internal Medicine

## 2015-04-04 DIAGNOSIS — R7989 Other specified abnormal findings of blood chemistry: Secondary | ICD-10-CM | POA: Diagnosis not present

## 2015-04-04 DIAGNOSIS — R945 Abnormal results of liver function studies: Principal | ICD-10-CM

## 2015-04-04 LAB — HEPATIC FUNCTION PANEL
ALK PHOS: 73 U/L (ref 39–117)
ALT: 43 U/L (ref 0–53)
AST: 36 U/L (ref 0–37)
Albumin: 3.4 g/dL — ABNORMAL LOW (ref 3.5–5.2)
BILIRUBIN DIRECT: 0.2 mg/dL (ref 0.0–0.3)
Total Bilirubin: 0.9 mg/dL (ref 0.2–1.2)
Total Protein: 6.4 g/dL (ref 6.0–8.3)

## 2015-04-04 NOTE — Telephone Encounter (Signed)
Pt aware.

## 2015-04-04 NOTE — Telephone Encounter (Signed)
-----   Message from Algernon Huxley, RN sent at 02/26/2015  2:12 PM EDT ----- Regarding: LFT's Needs LFT's in May

## 2015-04-05 ENCOUNTER — Other Ambulatory Visit: Payer: Self-pay

## 2015-04-05 DIAGNOSIS — K754 Autoimmune hepatitis: Secondary | ICD-10-CM

## 2015-04-05 NOTE — Telephone Encounter (Signed)
Left message for pt to call back.  Spoke with pt and he is aware of results.

## 2015-04-14 ENCOUNTER — Other Ambulatory Visit: Payer: Self-pay | Admitting: Cardiovascular Disease

## 2015-04-18 ENCOUNTER — Encounter: Payer: Self-pay | Admitting: Cardiovascular Disease

## 2015-04-18 ENCOUNTER — Telehealth: Payer: Self-pay | Admitting: Cardiovascular Disease

## 2015-04-18 ENCOUNTER — Ambulatory Visit (INDEPENDENT_AMBULATORY_CARE_PROVIDER_SITE_OTHER): Payer: Medicare Other | Admitting: Cardiovascular Disease

## 2015-04-18 VITALS — BP 118/68 | HR 66 | Ht 74.0 in | Wt 209.4 lb

## 2015-04-18 DIAGNOSIS — I83029 Varicose veins of left lower extremity with ulcer of unspecified site: Secondary | ICD-10-CM

## 2015-04-18 DIAGNOSIS — R011 Cardiac murmur, unspecified: Secondary | ICD-10-CM | POA: Diagnosis not present

## 2015-04-18 DIAGNOSIS — R609 Edema, unspecified: Secondary | ICD-10-CM | POA: Diagnosis not present

## 2015-04-18 DIAGNOSIS — L97919 Non-pressure chronic ulcer of unspecified part of right lower leg with unspecified severity: Secondary | ICD-10-CM

## 2015-04-18 DIAGNOSIS — L97929 Non-pressure chronic ulcer of unspecified part of left lower leg with unspecified severity: Secondary | ICD-10-CM

## 2015-04-18 DIAGNOSIS — Z7901 Long term (current) use of anticoagulants: Secondary | ICD-10-CM | POA: Diagnosis not present

## 2015-04-18 DIAGNOSIS — I872 Venous insufficiency (chronic) (peripheral): Secondary | ICD-10-CM | POA: Diagnosis not present

## 2015-04-18 MED ORDER — FUROSEMIDE 20 MG PO TABS: ORAL_TABLET | ORAL | Status: DC

## 2015-04-18 NOTE — Patient Instructions (Addendum)
Medication Instructions: - Start lasix (furosemide) 20 mg take one tablet by mouth daily as needed for swelling  Labwork: - Your physician recommends that you have lab work today: BMP/ INR  Procedures/Testing: - Your physician has requested that you have an echocardiogram. Echocardiography is a painless test that uses sound waves to create images of your heart. It provides your doctor with information about the size and shape of your heart and how well your heart's chambers and valves are working. This procedure takes approximately one hour. There are no restrictions for this procedure.  Follow-Up: - You have been referred to : [facility] ([facility])- Dr. Kellie Simmering Dr. Donnetta Hutching for venous insufficiency ulcers/ swelling  Any Additional Special Instructions Will Be Listed Below (If Applicable). - the rep for the compression devices will be in touch with you (we will submit paperwork to her to be in touch with you).

## 2015-04-18 NOTE — Telephone Encounter (Signed)
Pt c/o swelling: STAT is pt has developed SOB within 24 hours  1. How long have you been experiencing swelling? A couple of years  2. Where is the swelling located? In pt's legs  3.  Are you currently taking a "fluid pill"? No  4.  Are you currently SOB? No  5.  Have you traveled recently? Yes, went to [place] this past weekend  Pt calling stating that his legs have swollen so badly that his leg is bleeding.

## 2015-04-18 NOTE — Telephone Encounter (Signed)
I spoke with the patient. He typically does have swelling in his lower extremities. He uses compression socks. Elevation of the lower extremities will typically help his swelling. He denies use of diuretics.  He reports that he has done quite a bit of traveling since last week. He was in [place], then came home and had cataract surgery, then he was in [place] over the weekend and got home Sunday. He states that as he was removing his compression socks last night and had fluid/ blood oozing from the left leg. I inquired if his skin has been pulled back on his legs or if he has an open wound. He states he has an open wound. He was concerned about this and wanted someone to look at his legs. He denies any change in his weight/ increased SOB. His swelling is about the same bilaterally. In reviewing his chart, his last INR on 03/28/12 was 3.0. Reviewed with Theodosia Quay, RN for Dr. Burt Knack- ok to add to Dr. Antionette Char schedule this afternoon. Dr. Burt Knack did have an opening at 3:15 pm today and the patient is agreeable with being seen at that time. I have encouraged the patient to try to keep his legs elevated through the day today.

## 2015-04-18 NOTE — Progress Notes (Signed)
Cardiology Office Note   Date:  04/19/2015   ID:  Arthur [name], DOB 1925/10/26, MRN [id number]  PCP:  [name], MD  Cardiologist:  Sherren Mocha, MD    No chief complaint on file.    History of Present Illness: Arthur [name] is a 81 y.o. male who presents for follow-up of atrial fibrillation on chronic anticoagulation with warfarin. He also has coronary artery disease and with history of anteroseptal and inferior MIs in the 1990s. He underwent stenting of the LAD in 2000. The patient also has chronic leg swelling without good response to conservative measures utilizing compression stockings and leg elevation.  He's been traveling a lot over the past few weeks. He complains of worsening swelling. He's had some oozing of fluid and bleeding from a wound on the left leg. He also complains of pain in the shins. No shortness of breath or chest pain. Denies sodium indiscretion. Complies with leg elevation. No chest pain, shortness of breath, lightheadedness, heart palpitations, or other cardiac-related complaints.  Past Medical History  Diagnosis Date  . Coronary artery disease     MI, atherectomy 1993  . Hyperlipidemia   . Arrhythmia     1995  . Atrial fibrillation   . History of transient ischemic attack (TIA) 2005    double vision  . Hypertension   . History of gallstones   . 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 Eczema   . Elevated LFTs   . Skin cancer   . DDD (degenerative disc disease)   . Pneumonia     long time ago    Past Surgical History  Procedure Laterality Date  . Cardiac catheterization  04/08/99    LAD 70% INTER 40-50% RCA 30-40%  . Appendectomy    . Coronary angioplasty  04/08/99    LAD  . Coronary angioplasty with stent placement  04/08/99    LAD  . Coronary angioplasty  10/17/97    RCA   . Melanoma excision  2013    lt arm  . Tonsillectomy    . Amputation Right 05/10/2013    Procedure: RIGHT SECOND TOE AMPUTATION;  Surgeon: Hessie Dibble, MD;  Location: [facility];  Service: Orthopedics;  Laterality: Right;  . Melanoma excision Left 08/23/2013    Procedure: MELANOMA EXCISION;  Surgeon: Pedro Earls, MD;  Location: [facility];  Service: General;  Laterality: Left;    Current Outpatient Prescriptions  Medication Sig Dispense Refill  . losartan (COZAAR) 50 MG tablet TAKE 1 TABLET (50 MG TOTAL) BY MOUTH DAILY. 90 tablet 2  . metoprolol tartrate (LOPRESSOR) 25 MG tablet TAKE 1 TABLET DAILY 90 tablet 1  . predniSONE (DELTASONE) 10 MG tablet Take 2 tablets (20 mg total) by mouth daily with breakfast. 100 tablet 3  . warfarin (COUMADIN) 5 MG tablet TAKE AS DIRECTED BY COUMADIN CLINIC. 30 tablet 2  . DUREZOL 0.05 % EMUL Place one drop into the right eye three times a day  1  . furosemide (LASIX) 20 MG tablet Take one tablet (20 mg) by mouth once daily as needed for swelling 30 tablet 2   No current facility-administered medications for this visit.    Allergies:   Penicillins   Social History:  The patient  reports that he quit smoking about 47 years ago. His smoking use included Cigarettes and Cigars. He has never used smokeless tobacco. He reports that he drinks alcohol. He reports that he does not use  illicit drugs.   Family History:  The patient's  family history includes Heart attack (age of onset: 25) in his mother; Pneumonia in his father.    ROS:  Please see the history of present illness.  Otherwise, review of systems is positive for leg swelling and leg pain.  All other systems are reviewed and negative.    PHYSICAL EXAM: VS:  BP 118/68 mmHg  Pulse 66  Ht 6\' 2"  (1.88 m)  Wt 209 lb 6 oz (94.972 kg)  BMI 26.87 kg/m2 , BMI Body mass index is 26.87 kg/(m^2). GEN: Well nourished, well developed, in no acute distress HEENT: normal Neck: no JVD, no masses. No carotid bruits Cardiac: irregularly irregular with 2/6 mid-peaking systolic murmur at the LSB              Respiratory:  clear to  auscultation bilaterally, normal work of breathing GI: soft, nontender, nondistended, + BS MS: no deformity or atrophy Ext: 2+ pretibial edema bilaterally, pedal pulses 2+= bilaterally Skin: warm and dry, stasis ulcerations bilaterally (anterior aspect of lower legs). Friable skin tissue bilaterally Neuro:  Strength and sensation are intact Psych: euthymic mood, full affect  EKG:  EKG is not ordered today.  Recent Labs: 04/04/2015: ALT 43 04/18/2015: BUN 28*; Creatinine 1.47; Potassium 5.4*; Sodium 136   Lipid Panel     Component Value Date/Time   CHOL 133 12/09/2012 0837   TRIG 62.0 12/09/2012 0837   HDL 39.30 12/09/2012 0837   CHOLHDL 3 12/09/2012 0837   VLDL 12.4 12/09/2012 0837   LDLCALC 81 12/09/2012 0837      Wt Readings from Last 3 Encounters:  04/18/15 209 lb 6 oz (94.972 kg)  10/19/14 203 lb 12.8 oz (92.443 kg)  10/10/14 205 lb 6.4 oz (93.169 kg)     ASSESSMENT AND PLAN: 1.  CAD, native vessel, without angina: continue current medical therapy as he appears stable from a coronary perspective. No antiplatelet rx in setting chronic oral anticoagulation.  2. Essential HTN: BP well-controlled on losartan.  3. Permanent atrial fibrillation: tolerating warfarin. Ventricular rate within normal limits  5. Edema: suspect venous stasis. This is his primary complaint. Now with stasis ulceration. Has been dissatisfied with [facility]. Will refer to [facility] (Dr Early or Dr Kellie Simmering) for evaluation of treatment options. Start lasix 20 mg daily. Will also consider compression device for chronic leg edema, but not sure it can be placed in context of ulcerations.  6. Cardiac murmur: update 2D echo. Also need to assess LV systolic and diastolic function in setting of peripheral edema.   Current medicines are reviewed with the patient today.  The patient does not have concerns regarding medicines.  Labs/ tests ordered today include:   Orders Placed This Encounter  Procedures  . Basic  Metabolic Panel (BMET)  . INR/PT  . Ambulatory referral to Vascular Surgery  . Echocardiogram    Disposition:   FU 6 months  Signed, Sherren Mocha, MD  04/19/2015 4:32 PM    [facility] [address], [place], [place]  [address] Phone: [phone]; Fax: [phone]

## 2015-04-19 ENCOUNTER — Telehealth: Payer: Self-pay | Admitting: *Deleted

## 2015-04-19 DIAGNOSIS — E875 Hyperkalemia: Secondary | ICD-10-CM

## 2015-04-19 LAB — BASIC METABOLIC PANEL
BUN: 28 mg/dL — ABNORMAL HIGH (ref 6–23)
CHLORIDE: 102 meq/L (ref 96–112)
CO2: 29 meq/L (ref 19–32)
CREATININE: 1.47 mg/dL (ref 0.40–1.50)
Calcium: 8.9 mg/dL (ref 8.4–10.5)
GFR: 48.06 mL/min — AB (ref >60.00)
GLUCOSE: 101 mg/dL — AB (ref 70–99)
Potassium: 5.4 mEq/L — ABNORMAL HIGH (ref 3.5–5.1)
Sodium: 136 mEq/L (ref 135–145)

## 2015-04-19 LAB — PROTIME-INR
INR: 3 ratio — ABNORMAL HIGH (ref 0.8–1.0)
PROTHROMBIN TIME: 32.7 s — AB (ref 9.6–13.1)

## 2015-04-19 NOTE — Telephone Encounter (Signed)
Left detailed message for the pt to call back to our office to endorse his lab results and new orders per [facility] Cecilie Kicks NP.  Pts lab results from yesterday indicate that his potassium is 5.4.  Noted the pt was prescribed Lasix 20 mg po once daily as needed for swelling.  Per [facility] Cecilie Kicks NP the pt should take a dose of Lasix tonight or tomorrow and come back in for a repeat BMET on tomorrow or Monday.  Tried contacting all numbers listed in pts chart to endorse new orders but was unsuccessful in reaching him.  D[facility] is aware that there was no success in reaching the pt. Will go ahead and place BMET order in the system.  Will route this message to pts Primary Cardiologist Dr Burt Knack and nurse to follow-up with the pt and to make aware of new orders per Cecilie Kicks NP.

## 2015-04-20 ENCOUNTER — Other Ambulatory Visit: Payer: Self-pay

## 2015-04-20 ENCOUNTER — Other Ambulatory Visit (INDEPENDENT_AMBULATORY_CARE_PROVIDER_SITE_OTHER): Payer: Medicare Other

## 2015-04-20 DIAGNOSIS — E875 Hyperkalemia: Secondary | ICD-10-CM

## 2015-04-20 DIAGNOSIS — I83893 Varicose veins of bilateral lower extremities with other complications: Secondary | ICD-10-CM

## 2015-04-20 LAB — BASIC METABOLIC PANEL
BUN: 31 mg/dL — ABNORMAL HIGH (ref 6–23)
CALCIUM: 9.1 mg/dL (ref 8.4–10.5)
CO2: 28 mEq/L (ref 19–32)
CREATININE: 1.48 mg/dL (ref 0.40–1.50)
Chloride: 101 mEq/L (ref 96–112)
GFR: 47.69 mL/min — ABNORMAL LOW (ref >60.00)
Glucose, Bld: 92 mg/dL (ref 70–99)
Potassium: 4.4 mEq/L (ref 3.5–5.1)
Sodium: 137 mEq/L (ref 135–145)

## 2015-04-20 NOTE — Addendum Note (Signed)
Addended by: Barkley Boards on: 04/20/2015 12:27 PM   Modules accepted: Orders

## 2015-04-20 NOTE — Telephone Encounter (Signed)
I spoke with the pt and made him aware of lab results.  The pt took a dosage of lasix yesterday and today and his swelling is improving.  The pt will come into the office today for a repeat BMP.

## 2015-04-24 ENCOUNTER — Encounter (HOSPITAL_COMMUNITY)
Admission: RE | Admit: 2015-04-24 | Discharge: 2015-04-24 | Disposition: A | Discharge status: Home / Self Care | Payer: Medicare Other | Source: Ambulatory Visit | Attending: Oncology | Admitting: Oncology

## 2015-04-24 ENCOUNTER — Other Ambulatory Visit (HOSPITAL_BASED_OUTPATIENT_CLINIC_OR_DEPARTMENT_OTHER): Payer: Medicare Other

## 2015-04-24 ENCOUNTER — Telehealth: Payer: Self-pay | Admitting: Cardiovascular Disease

## 2015-04-24 DIAGNOSIS — C439 Malignant melanoma of skin, unspecified: Secondary | ICD-10-CM

## 2015-04-24 DIAGNOSIS — C4362 Malignant melanoma of left upper limb, including shoulder: Secondary | ICD-10-CM

## 2015-04-24 LAB — COMPREHENSIVE METABOLIC PANEL (CC13)
ALBUMIN: 3.2 g/dL — AB (ref 3.5–5.0)
ALK PHOS: 80 U/L (ref 40–150)
ALT: 45 U/L (ref 0–55)
AST: 37 U/L — ABNORMAL HIGH (ref 5–34)
Anion Gap: 10 mEq/L (ref 3–11)
BUN: 29.4 mg/dL — AB (ref 7.0–26.0)
CALCIUM: 8.6 mg/dL (ref 8.4–10.4)
CO2: 27 mEq/L (ref 22–29)
Chloride: 103 mEq/L (ref 98–109)
Creatinine: 1.4 mg/dL — ABNORMAL HIGH (ref 0.7–1.3)
EGFR: 45 mL/min/{1.73_m2} — AB (ref >90)
Glucose: 125 mg/dl (ref 70–140)
POTASSIUM: 5.1 meq/L (ref 3.5–5.1)
Sodium: 140 mEq/L (ref 136–145)
Total Bilirubin: 1.08 mg/dL (ref 0.20–1.20)
Total Protein: 6.3 g/dL — ABNORMAL LOW (ref 6.4–8.3)

## 2015-04-24 LAB — CBC WITH DIFFERENTIAL/PLATELET
BASO%: 0.1 % (ref 0.0–2.0)
Basophils Absolute: 0 10*3/uL (ref 0.0–0.1)
EOS ABS: 0 10*3/uL (ref 0.0–0.5)
EOS%: 0.1 % (ref 0.0–7.0)
HEMATOCRIT: 44.9 % (ref 38.4–49.9)
HGB: 14.9 g/dL (ref 13.0–17.1)
LYMPH%: 35.9 % (ref 14.0–49.0)
MCH: 33.4 pg (ref 27.2–33.4)
MCHC: 33.2 g/dL (ref 32.0–36.0)
MCV: 100.7 fL — AB (ref 79.3–98.0)
MONO#: 0.8 10*3/uL (ref 0.1–0.9)
MONO%: 9.4 % (ref 0.0–14.0)
NEUT#: 4.6 10*3/uL (ref 1.5–6.5)
NEUT%: 54.5 % (ref 39.0–75.0)
PLATELETS: 162 10*3/uL (ref 140–400)
RBC: 4.46 10*6/uL (ref 4.20–5.82)
RDW: 14.9 % — ABNORMAL HIGH (ref 11.0–14.6)
WBC: 8.5 10*3/uL (ref 4.0–10.3)
lymph#: 3 10*3/uL (ref 0.9–3.3)

## 2015-04-24 LAB — GLUCOSE, CAPILLARY: Glucose-Capillary: 118 mg/dL — ABNORMAL HIGH (ref 65–99)

## 2015-04-24 LAB — LACTATE DEHYDROGENASE (CC13): LDH: 323 U/L — ABNORMAL HIGH (ref 125–245)

## 2015-04-24 MED ORDER — FLUDEOXYGLUCOSE F - 18 (FDG) INJECTION
9.7000 | Freq: Once | INTRAVENOUS | Status: AC | PRN
  Administered 2015-04-24: 9.7 via INTRAVENOUS

## 2015-04-24 NOTE — Telephone Encounter (Signed)
Walk in pt form-patient has questions-Lauren back Wednesday ( 04/25/15) will give to her then 04/24/15

## 2015-04-26 ENCOUNTER — Telehealth: Payer: Self-pay | Admitting: Oncology

## 2015-04-26 ENCOUNTER — Ambulatory Visit (HOSPITAL_BASED_OUTPATIENT_CLINIC_OR_DEPARTMENT_OTHER): Payer: Medicare Other | Admitting: Oncology

## 2015-04-26 VITALS — BP 140/75 | HR 68 | Temp 97.5°F | Resp 18 | Ht 74.0 in | Wt 204.4 lb

## 2015-04-26 DIAGNOSIS — C439 Malignant melanoma of skin, unspecified: Secondary | ICD-10-CM

## 2015-04-26 DIAGNOSIS — Z85820 Personal history of malignant melanoma of skin: Secondary | ICD-10-CM | POA: Diagnosis not present

## 2015-04-26 NOTE — Telephone Encounter (Signed)
Confirmed appointment for December. Mailed calendar.

## 2015-04-26 NOTE — Progress Notes (Signed)
Hematology and Oncology Follow Up Visit  Arthur [name] [id number] 09/09/1925 81 y.o. 04/26/2015 8:42 AM Arthur [name], MDHopper, Arthur Penna, MD   Principle Diagnosis: 81 year old gentleman diagnosed with Malignant melanoma initially diagnosed in 2012 and found to have a T2b lesion with the depth of invasion of 1.75 mm and a Clark's level IV. He developed regional relapse in October of 2014.    Prior Therapy: 1. In May of 2012, he underwent wide excision and sentinel lymph node biopsy [name] by Dr. Hassell [name] which showed no lymph node involvement and no residual malignant melanoma.  2. he is status post Re-excision of site of melanoma satellite lesion of left forearm. This was [name] on 08/23/2013.   Current therapy: Observation and surveillance.  Interim History:  Arthur [name] presents today for a followup visit. Since the last visit, he reports few complaints. He reports LE edema and slight DOE. He does not report chest pain or cough. Does not report any hemoptysis. He is continued to perform activities of daily living without any hindrance or decline. He is continued to be on low-dose prednisone due to autoimmune hepatitis and currently at 20 mg daily. He has not had any recent illnesses or hospitalizations. He does not report any headaches or blurry vision or double vision. Does not report any chest pain or shortness of breath. As that report any cough or hemoptysis. Does not report any nausea or vomiting or abdominal pain. Did not report any hematochezia or melena. Did not report any frequency urgency or hesitancy. Does not report any musculoskeletal complaints. Her recent rashes or lesions. The rest of the review of system is unremarkable.   Medications: I have reviewed the patient's current medications.  Current Outpatient Prescriptions  Medication Sig Dispense Refill  . DUREZOL 0.05 % EMUL Place one drop into the right eye three times a day  1  . furosemide (LASIX) 20 MG tablet Take one tablet  (20 mg) by mouth once daily as needed for swelling 30 tablet 2  . losartan (COZAAR) 50 MG tablet TAKE 1 TABLET (50 MG TOTAL) BY MOUTH DAILY. 90 tablet 2  . metoprolol tartrate (LOPRESSOR) 25 MG tablet TAKE 1 TABLET DAILY 90 tablet 1  . predniSONE (DELTASONE) 10 MG tablet Take 2 tablets (20 mg total) by mouth daily with breakfast. 100 tablet 3  . warfarin (COUMADIN) 5 MG tablet TAKE AS DIRECTED BY COUMADIN CLINIC. 30 tablet 2   No current facility-administered medications for this visit.     Allergies:  Allergies  Allergen Reactions  . Penicillins Hives       Physical Exam: Blood pressure 140/75, pulse 68, temperature 97.5 F (36.4 C), temperature source Oral, resp. rate 18, height 6\' 2"  (1.88 m), weight 204 lb 6.4 oz (92.715 kg), SpO2 98 %. ECOG: 1 General appearance: alert awake not in any distress. Slightly dyspneic. Head: Normocephalic, without any masses or lesions. Neck: no adenopathy, or masses. Lymph nodes: Cervical, supraclavicular, and axillary nodes normal.no inguinal adenopathy noted either. Heart:regular rate and rhythm, S1, S2 normal, no murmur, click, rub or gallop Lung:chest clear, no wheezing, rales, normal symmetric air entry Abdomin: soft, non-tender, without masses or organomegaly  EXT:no erythema, induration, or nodules Skin showed no rashes or lesions. I could not appreciate any other melanoma lesions.  Lab Results: Lab Results  Component Value Date   WBC 8.5 04/24/2015   HGB 14.9 04/24/2015   HCT 44.9 04/24/2015   MCV 100.7* 04/24/2015   PLT 162 04/24/2015  Chemistry      Component Value Date/Time   NA 140 04/24/2015 0844   NA 137 04/20/2015 1328   K 5.1 04/24/2015 0844   K 4.4 04/20/2015 1328   CL 101 04/20/2015 1328   CO2 27 04/24/2015 0844   CO2 28 04/20/2015 1328   BUN 29.4* 04/24/2015 0844   BUN 31* 04/20/2015 1328   CREATININE 1.4* 04/24/2015 0844   CREATININE 1.48 04/20/2015 1328   CREATININE 1.51* 04/20/2013 1639       Component Value Date/Time   CALCIUM 8.6 04/24/2015 0844   CALCIUM 9.1 04/20/2015 1328   ALKPHOS 80 04/24/2015 0844   ALKPHOS 73 04/04/2015 1408   AST 37* 04/24/2015 0844   AST 36 04/04/2015 1408   ALT 45 04/24/2015 0844   ALT 43 04/04/2015 1408   BILITOT 1.08 04/24/2015 0844   BILITOT 0.9 04/04/2015 1408     EXAM: NUCLEAR MEDICINE PET WHOLE BODY  TECHNIQUE: 9.7 mCi F-18 FDG was injected intravenously. Full-ring PET imaging was performed from the vertex to the feet after the radiotracer. CT data was obtained and used for attenuation correction and anatomic localization.  FASTING BLOOD GLUCOSE: Value: 118 mg/dl  COMPARISON: 07/29/2013  FINDINGS: Head/Neck: No hypermetabolic lymph nodes in the neck.  Chest: No hypermetabolic mediastinal or hilar nodes. Small right pleural effusion noted. No suspicious pulmonary nodules on the CT scan. Aortic atherosclerosis noted. There also calcifications within the left main coronary artery, LAD, left circumflex and RCA coronary arteries.  Abdomen/Pelvis: No abnormal hypermetabolic activity within the liver, pancreas, adrenal glands, or spleen. Gallstones are identified. No hypermetabolic lymph nodes in the abdomen or pelvis. Aortic atherosclerosis noted.  Skeleton: No focal hypermetabolic activity to suggest skeletal metastasis.  Extremities: No hypermetabolic activity to suggest metastasis.  IMPRESSION: 1. No definite size is just metastatic disease on today's exam. 2. Aortic atherosclerosis as well as left main and 3 vessel Coronary artery disease. 3. Gallstones.  Impression and Plan:  81 year old gentleman with the following issues:   1. Malignant melanoma initially diagnosed in 2012 and found to have a T2b lesion with the depth of invasion of 1.75 mm and a Clark's level IV. No ulcerations or vascular invasion was noted and was treated with a wide excision and a sentinel lymph node biopsy that was negative. He  had a punch biopsy of a lesion in his left elbow which showed deposits of atypical melanocytes. He is status post reexcision of his melanoma lesion [name] on 08/23/2013. The pathology reveals area of malignant melanoma with negative resection margins.   His PET CT scan on 04/24/2015 was discussed today and did not show any evidence of metastatic disease. His clinical visit and examination today did not suggest that either.  He continues to be at high risk of relapse and we'll continue clinical follow-up every 6 months and repeat imaging studies in 12 months.  2. Followup: Will be in 6 months for clinical visit sooner if needed to.   [facility], MD 6/9/20168:42 AM

## 2015-04-27 ENCOUNTER — Other Ambulatory Visit: Payer: Self-pay

## 2015-04-27 ENCOUNTER — Ambulatory Visit (HOSPITAL_COMMUNITY): Payer: Medicare Other | Attending: Internal Medicine

## 2015-04-27 DIAGNOSIS — I059 Rheumatic mitral valve disease, unspecified: Secondary | ICD-10-CM | POA: Insufficient documentation

## 2015-04-27 DIAGNOSIS — I350 Nonrheumatic aortic (valve) stenosis: Secondary | ICD-10-CM | POA: Insufficient documentation

## 2015-04-27 DIAGNOSIS — R011 Cardiac murmur, unspecified: Secondary | ICD-10-CM

## 2015-04-27 DIAGNOSIS — I700 Atherosclerosis of aorta: Secondary | ICD-10-CM | POA: Insufficient documentation

## 2015-04-27 DIAGNOSIS — I071 Rheumatic tricuspid insufficiency: Secondary | ICD-10-CM | POA: Diagnosis not present

## 2015-04-27 DIAGNOSIS — R609 Edema, unspecified: Secondary | ICD-10-CM

## 2015-04-27 DIAGNOSIS — I351 Nonrheumatic aortic (valve) insufficiency: Secondary | ICD-10-CM | POA: Insufficient documentation

## 2015-04-30 ENCOUNTER — Encounter: Payer: Self-pay | Admitting: Cardiovascular Disease

## 2015-04-30 NOTE — Telephone Encounter (Signed)
This encounter was created in error - please disregard.

## 2015-04-30 NOTE — Telephone Encounter (Signed)
New message       Talk to a nurse regarding his echo from last week.  He has questions.

## 2015-05-01 ENCOUNTER — Encounter: Payer: Self-pay | Admitting: Surgery

## 2015-05-03 ENCOUNTER — Telehealth: Payer: Self-pay | Admitting: Internal Medicine

## 2015-05-03 MED ORDER — PREDNISONE 10 MG PO TABS: 20.0000 mg | ORAL_TABLET | Freq: Every day | ORAL | Status: DC

## 2015-05-03 NOTE — Telephone Encounter (Signed)
Refilled Prednisone

## 2015-05-04 ENCOUNTER — Inpatient Hospital Stay (HOSPITAL_COMMUNITY)
Admission: EM | Admit: 2015-05-04 | Discharge: 2015-05-07 | DRG: 813 | Disposition: A | Discharge status: Home / Self Care | Payer: Medicare Other | Attending: Internal Medicine | Admitting: Internal Medicine

## 2015-05-04 ENCOUNTER — Telehealth: Payer: Self-pay | Admitting: Internal Medicine

## 2015-05-04 ENCOUNTER — Emergency Department (INDEPENDENT_AMBULATORY_CARE_PROVIDER_SITE_OTHER)
Admission: EM | Admit: 2015-05-04 | Discharge: 2015-05-04 | Disposition: A | Discharge status: Nursing Home (Includes ICF) | Payer: Medicare Other | Source: Home / Self Care | Attending: Family Medicine | Admitting: Family Medicine

## 2015-05-04 ENCOUNTER — Ambulatory Visit (INDEPENDENT_AMBULATORY_CARE_PROVIDER_SITE_OTHER)
Admission: RE | Admit: 2015-05-04 | Discharge: 2015-05-04 | Disposition: A | Discharge status: Home / Self Care | Payer: Medicare Other | Source: Ambulatory Visit | Attending: Surgery | Admitting: Surgery

## 2015-05-04 ENCOUNTER — Encounter (HOSPITAL_COMMUNITY): Payer: Self-pay | Admitting: Emergency Medicine

## 2015-05-04 ENCOUNTER — Encounter: Payer: Self-pay | Admitting: Surgery

## 2015-05-04 ENCOUNTER — Telehealth: Payer: Self-pay | Admitting: Cardiovascular Disease

## 2015-05-04 ENCOUNTER — Ambulatory Visit (INDEPENDENT_AMBULATORY_CARE_PROVIDER_SITE_OTHER): Payer: Medicare Other | Admitting: Surgery

## 2015-05-04 ENCOUNTER — Encounter (HOSPITAL_COMMUNITY): Payer: Self-pay | Admitting: Family Medicine

## 2015-05-04 ENCOUNTER — Emergency Department (INDEPENDENT_AMBULATORY_CARE_PROVIDER_SITE_OTHER): Payer: Medicare Other

## 2015-05-04 VITALS — BP 110/59 | HR 71 | Resp 20 | Ht 74.0 in | Wt 205.0 lb

## 2015-05-04 DIAGNOSIS — I714 Abdominal aortic aneurysm, without rupture, unspecified: Secondary | ICD-10-CM | POA: Insufficient documentation

## 2015-05-04 DIAGNOSIS — N183 Chronic kidney disease, stage 3 (moderate): Secondary | ICD-10-CM | POA: Diagnosis present

## 2015-05-04 DIAGNOSIS — I2510 Atherosclerotic heart disease of native coronary artery without angina pectoris: Secondary | ICD-10-CM | POA: Diagnosis present

## 2015-05-04 DIAGNOSIS — Z89421 Acquired absence of other right toe(s): Secondary | ICD-10-CM

## 2015-05-04 DIAGNOSIS — Z8673 Personal history of transient ischemic attack (TIA), and cerebral infarction without residual deficits: Secondary | ICD-10-CM

## 2015-05-04 DIAGNOSIS — G459 Transient cerebral ischemic attack, unspecified: Secondary | ICD-10-CM | POA: Diagnosis present

## 2015-05-04 DIAGNOSIS — Z85820 Personal history of malignant melanoma of skin: Secondary | ICD-10-CM

## 2015-05-04 DIAGNOSIS — D62 Acute posthemorrhagic anemia: Secondary | ICD-10-CM | POA: Diagnosis present

## 2015-05-04 DIAGNOSIS — D6832 Hemorrhagic disorder due to extrinsic circulating anticoagulants: Secondary | ICD-10-CM | POA: Diagnosis present

## 2015-05-04 DIAGNOSIS — E785 Hyperlipidemia, unspecified: Secondary | ICD-10-CM | POA: Diagnosis present

## 2015-05-04 DIAGNOSIS — I10 Essential (primary) hypertension: Secondary | ICD-10-CM | POA: Diagnosis present

## 2015-05-04 DIAGNOSIS — I83893 Varicose veins of bilateral lower extremities with other complications: Secondary | ICD-10-CM | POA: Diagnosis not present

## 2015-05-04 DIAGNOSIS — Z8249 Family history of ischemic heart disease and other diseases of the circulatory system: Secondary | ICD-10-CM | POA: Diagnosis not present

## 2015-05-04 DIAGNOSIS — Z880 Allergy status to penicillin: Secondary | ICD-10-CM | POA: Diagnosis not present

## 2015-05-04 DIAGNOSIS — Z79899 Other long term (current) drug therapy: Secondary | ICD-10-CM | POA: Diagnosis not present

## 2015-05-04 DIAGNOSIS — I4891 Unspecified atrial fibrillation: Secondary | ICD-10-CM | POA: Diagnosis present

## 2015-05-04 DIAGNOSIS — Z87891 Personal history of nicotine dependence: Secondary | ICD-10-CM | POA: Diagnosis not present

## 2015-05-04 DIAGNOSIS — L97909 Non-pressure chronic ulcer of unspecified part of unspecified lower leg with unspecified severity: Secondary | ICD-10-CM | POA: Diagnosis present

## 2015-05-04 DIAGNOSIS — Z7901 Long term (current) use of anticoagulants: Secondary | ICD-10-CM

## 2015-05-04 DIAGNOSIS — R042 Hemoptysis: Secondary | ICD-10-CM | POA: Diagnosis present

## 2015-05-04 DIAGNOSIS — C439 Malignant melanoma of skin, unspecified: Secondary | ICD-10-CM | POA: Diagnosis present

## 2015-05-04 DIAGNOSIS — I25119 Atherosclerotic heart disease of native coronary artery with unspecified angina pectoris: Secondary | ICD-10-CM

## 2015-05-04 DIAGNOSIS — Z955 Presence of coronary angioplasty implant and graft: Secondary | ICD-10-CM | POA: Diagnosis not present

## 2015-05-04 DIAGNOSIS — I482 Chronic atrial fibrillation, unspecified: Secondary | ICD-10-CM

## 2015-05-04 DIAGNOSIS — I252 Old myocardial infarction: Secondary | ICD-10-CM | POA: Diagnosis not present

## 2015-05-04 DIAGNOSIS — I82509 Chronic embolism and thrombosis of unspecified deep veins of unspecified lower extremity: Secondary | ICD-10-CM | POA: Diagnosis present

## 2015-05-04 DIAGNOSIS — J189 Pneumonia, unspecified organism: Secondary | ICD-10-CM

## 2015-05-04 DIAGNOSIS — I481 Persistent atrial fibrillation: Secondary | ICD-10-CM | POA: Diagnosis not present

## 2015-05-04 DIAGNOSIS — K754 Autoimmune hepatitis: Secondary | ICD-10-CM

## 2015-05-04 DIAGNOSIS — I872 Venous insufficiency (chronic) (peripheral): Secondary | ICD-10-CM

## 2015-05-04 DIAGNOSIS — I83009 Varicose veins of unspecified lower extremity with ulcer of unspecified site: Secondary | ICD-10-CM

## 2015-05-04 DIAGNOSIS — I129 Hypertensive chronic kidney disease with stage 1 through stage 4 chronic kidney disease, or unspecified chronic kidney disease: Secondary | ICD-10-CM | POA: Diagnosis present

## 2015-05-04 DIAGNOSIS — I712 Thoracic aortic aneurysm, without rupture: Secondary | ICD-10-CM | POA: Diagnosis not present

## 2015-05-04 DIAGNOSIS — J181 Lobar pneumonia, unspecified organism: Secondary | ICD-10-CM

## 2015-05-04 LAB — I-STAT ARTERIAL BLOOD GAS, ED
ACID-BASE EXCESS: 1 mmol/L (ref 0.0–2.0)
BICARBONATE: 24.8 meq/L — AB (ref 20.0–24.0)
O2 Saturation: 94 %
Patient temperature: 97.4
TCO2: 26 mmol/L (ref 0–100)
pCO2 arterial: 35.1 mmHg (ref 35.0–45.0)
pH, Arterial: 7.454 — ABNORMAL HIGH (ref 7.350–7.450)
pO2, Arterial: 65 mmHg — ABNORMAL LOW (ref 80.0–100.0)

## 2015-05-04 LAB — CBC WITH DIFFERENTIAL/PLATELET
Basophils Absolute: 0 10*3/uL (ref 0.0–0.1)
Basophils Relative: 0 % (ref 0–1)
EOS ABS: 0.1 10*3/uL (ref 0.0–0.7)
Eosinophils Relative: 1 % (ref 0–5)
HEMATOCRIT: 44.4 % (ref 39.0–52.0)
Hemoglobin: 15.1 g/dL (ref 13.0–17.0)
LYMPHS ABS: 3.9 10*3/uL (ref 0.7–4.0)
LYMPHS PCT: 39 % (ref 12–46)
MCH: 34 pg (ref 26.0–34.0)
MCHC: 34 g/dL (ref 30.0–36.0)
MCV: 100 fL (ref 78.0–100.0)
MONO ABS: 0.8 10*3/uL (ref 0.1–1.0)
Monocytes Relative: 8 % (ref 3–12)
Neutro Abs: 5.2 10*3/uL (ref 1.7–7.7)
Neutrophils Relative %: 52 % (ref 43–77)
Platelets: 154 10*3/uL (ref 150–400)
RBC: 4.44 MIL/uL (ref 4.22–5.81)
RDW: 15.1 % (ref 11.5–15.5)
WBC: 10 10*3/uL (ref 4.0–10.5)

## 2015-05-04 LAB — COMPREHENSIVE METABOLIC PANEL
ALT: 34 U/L (ref 17–63)
ANION GAP: 8 (ref 5–15)
AST: 35 U/L (ref 15–41)
Albumin: 3.1 g/dL — ABNORMAL LOW (ref 3.5–5.0)
Alkaline Phosphatase: 67 U/L (ref 38–126)
BILIRUBIN TOTAL: 1.5 mg/dL — AB (ref 0.3–1.2)
BUN: 36 mg/dL — AB (ref 6–20)
CO2: 27 mmol/L (ref 22–32)
CREATININE: 1.62 mg/dL — AB (ref 0.61–1.24)
Calcium: 8.7 mg/dL — ABNORMAL LOW (ref 8.9–10.3)
Chloride: 98 mmol/L — ABNORMAL LOW (ref 101–111)
GFR calc Af Amer: 42 mL/min — ABNORMAL LOW (ref >60)
GFR, EST NON AFRICAN AMERICAN: 37 mL/min — AB (ref >60)
Glucose, Bld: 185 mg/dL — ABNORMAL HIGH (ref 65–99)
POTASSIUM: 4.2 mmol/L (ref 3.5–5.1)
Sodium: 133 mmol/L — ABNORMAL LOW (ref 135–145)
Total Protein: 6.7 g/dL (ref 6.5–8.1)

## 2015-05-04 LAB — POCT I-STAT, CHEM 8
BUN: 36 mg/dL — ABNORMAL HIGH (ref 6–20)
CALCIUM ION: 1.17 mmol/L (ref 1.13–1.30)
CHLORIDE: 100 mmol/L — AB (ref 101–111)
Creatinine, Ser: 1.6 mg/dL — ABNORMAL HIGH (ref 0.61–1.24)
GLUCOSE: 93 mg/dL (ref 65–99)
HCT: 46 % (ref 39.0–52.0)
HEMOGLOBIN: 15.6 g/dL (ref 13.0–17.0)
Potassium: 4.8 mmol/L (ref 3.5–5.1)
Sodium: 136 mmol/L (ref 135–145)
TCO2: 27 mmol/L (ref 0–100)

## 2015-05-04 LAB — I-STAT CG4 LACTIC ACID, ED: Lactic Acid, Venous: 2.11 mmol/L (ref 0.5–2.0)

## 2015-05-04 LAB — APTT: aPTT: 53 seconds — ABNORMAL HIGH (ref 24–37)

## 2015-05-04 LAB — PROTIME-INR
INR: 2.75 — ABNORMAL HIGH (ref 0.00–1.49)
Prothrombin Time: 28.7 seconds — ABNORMAL HIGH (ref 11.6–15.2)

## 2015-05-04 MED ORDER — SODIUM CHLORIDE 0.9 % IV SOLN
INTRAVENOUS | Status: DC
  Administered 2015-05-04: 21:00:00 via INTRAVENOUS

## 2015-05-04 MED ORDER — VANCOMYCIN HCL IN DEXTROSE 1-5 GM/200ML-% IV SOLN: 1000.0000 mg | Freq: Once | INTRAVENOUS | Status: DC

## 2015-05-04 MED ORDER — PREDNISONE 50 MG PO TABS
50.0000 mg | ORAL_TABLET | Freq: Every day | ORAL | Status: DC
  Administered 2015-05-05: 50 mg via ORAL
  Filled 2015-05-04 (×2): qty 1

## 2015-05-04 MED ORDER — LEVOFLOXACIN IN D5W 750 MG/150ML IV SOLN: 750.0000 mg | INTRAVENOUS | Status: DC

## 2015-05-04 MED ORDER — IPRATROPIUM-ALBUTEROL 0.5-2.5 (3) MG/3ML IN SOLN
3.0000 mL | RESPIRATORY_TRACT | Status: DC
  Filled 2015-05-04: qty 3

## 2015-05-04 MED ORDER — VANCOMYCIN HCL 10 G IV SOLR
1250.0000 mg | INTRAVENOUS | Status: DC
  Filled 2015-05-04: qty 1250

## 2015-05-04 MED ORDER — LEVOFLOXACIN IN D5W 750 MG/150ML IV SOLN
750.0000 mg | Freq: Once | INTRAVENOUS | Status: AC
  Administered 2015-05-04: 750 mg via INTRAVENOUS
  Filled 2015-05-04: qty 150

## 2015-05-04 MED ORDER — SODIUM CHLORIDE 0.9 % IV SOLN
1750.0000 mg | Freq: Once | INTRAVENOUS | Status: AC
  Administered 2015-05-04: 1750 mg via INTRAVENOUS
  Filled 2015-05-04: qty 1750

## 2015-05-04 NOTE — ED Notes (Signed)
Pt here for dx with pneumonia. sts coughing blood today. sts on blood thinner.

## 2015-05-04 NOTE — ED Notes (Signed)
Went to draw the chem 8, patient stated that he had blood drawn five times this week, was apprehensive about blood being draw again. Reported to Janne Napoleon NP patient's statement. Shanon Brow was going to go back and discuss with patient, told patient that Shanon Brow was coming back in

## 2015-05-04 NOTE — Telephone Encounter (Signed)
Spoke with pt.  He states he has had blood in his sputum all day.  He is concerned it is his Coumadin.  Explained to patient that Coumadin does not usually cause you to bleed for no reason- it will just make the bleeding worse when there is a reason.  We could check his INR but this would not determine the cause of bleeding.  He needs to be evaluated for infection, etc.  Attempted to call his primary care doctor but unfortunately this late in the afternoon they did not have any available time.  He wanted to come to see our MDs here but explained to him that we do not treat non-cardiac issues.  Suggested he go to urgent care or ER for evaluation.  He is upset about this but agreeable.

## 2015-05-04 NOTE — ED Provider Notes (Signed)
CSN: [id number]     Arrival date & time 05/04/15  1834 History   First MD Initiated Contact with Patient 05/04/15 2016     Chief Complaint  Patient presents with  . Pneumonia     (Consider location/radiation/quality/duration/timing/severity/associated sxs/prior Treatment) HPI Comments: Patient here with 2 days of cough with hemoptysis and shortness of breath. No fever or chills. No chest pain or vomiting or diarrhea. Went to urgent care center and had an x-ray of his chest which was consistent with pneumonia. Patient does take Coumadin daily. Denies any black or bloody stools. No syncope or Near-syncope. Does note increased weakness.  Patient is a 81 y.o. male presenting with pneumonia. The history is provided by the patient.  Pneumonia    Past Medical History  Diagnosis Date  . Coronary artery disease     MI, atherectomy 1993  . Hyperlipidemia   . Arrhythmia     1995  . Atrial fibrillation   . History of transient ischemic attack (TIA) 2005    double vision  . Hypertension   . History of gallstones   . 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 Eczema   . Elevated LFTs   . Skin cancer   . DDD (degenerative disc disease)   . Pneumonia     long time ago  . Varicose veins    Past Surgical History  Procedure Laterality Date  . Cardiac catheterization  04/08/99    LAD 70% INTER 40-50% RCA 30-40%  . Appendectomy    . Coronary angioplasty  04/08/99    LAD  . Coronary angioplasty with stent placement  04/08/99    LAD  . Coronary angioplasty  10/17/97    RCA   . Melanoma excision  2013    lt arm  . Tonsillectomy    . Amputation Right 05/10/2013    Procedure: RIGHT SECOND TOE AMPUTATION;  Surgeon: Hessie Dibble, MD;  Location: [facility];  Service: Orthopedics;  Laterality: Right;  . Melanoma excision Left 08/23/2013    Procedure: MELANOMA EXCISION;  Surgeon: Pedro Earls, MD;  Location: [facility];  Service: General;  Laterality: Left;   Family History   Problem Relation Age of Onset  . Heart attack Mother 27    MI  . Pneumonia Father    History  Substance Use Topics  . Smoking status: Former Smoker    Types: Cigarettes, Cigars    Quit date: 11/18/1967  . Smokeless tobacco: Never Used     Comment: 1-2 cigars a day  . Alcohol Use: 0.0 oz/week    0 Standard drinks or equivalent per week     Comment: 2-3 glasses of wine in the evening    Review of Systems  All other systems reviewed and are negative.     Allergies  Penicillins  Home Medications   Prior to Admission medications   Medication Sig Start Date End Date Taking? Authorizing Provider  DUREZOL 0.05 % EMUL Place one drop into the right eye three times a day 03/15/15   Historical Provider, MD  furosemide (LASIX) 20 MG tablet Take one tablet (20 mg) by mouth once daily as needed for swelling 04/18/15   Sherren Mocha, MD  losartan (COZAAR) 50 MG tablet TAKE 1 TABLET (50 MG TOTAL) BY MOUTH DAILY. 10/16/14   Sherren Mocha, MD  metoprolol tartrate (LOPRESSOR) 25 MG tablet TAKE 1 TABLET DAILY 01/11/15   Sherren Mocha, MD  predniSONE (DELTASONE) 10 MG tablet Take 2 tablets (  20 mg total) by mouth daily with breakfast. 05/03/15   Irene Shipper, MD  warfarin (COUMADIN) 5 MG tablet TAKE AS DIRECTED BY COUMADIN CLINIC. 04/17/15   Sherren Mocha, MD   BP 178/101 mmHg  Pulse 86  Temp(Src) 98.8 F (37.1 C) (Oral)  Resp 18  SpO2 98% Physical Exam  Constitutional: He is oriented to person, place, and time. He appears well-developed and well-nourished.  Non-toxic appearance. No distress.  HENT:  Head: Normocephalic and atraumatic.  Eyes: Conjunctivae, EOM and lids are normal. Pupils are equal, round, and reactive to light.  Neck: Normal range of motion. Neck supple. No tracheal deviation present. No thyroid mass present.  Cardiovascular: Normal rate, regular rhythm and normal heart sounds.  Exam reveals no gallop.   No murmur heard. Pulmonary/Chest: Effort normal. No stridor. No  respiratory distress. He has decreased breath sounds. He has wheezes. He has no rhonchi. He has no rales.  Abdominal: Soft. Normal appearance and bowel sounds are normal. He exhibits no distension. There is no tenderness. There is no rebound and no CVA tenderness.  Musculoskeletal: Normal range of motion. He exhibits no edema or tenderness.  Neurological: He is alert and oriented to person, place, and time. He has normal strength. No cranial nerve deficit or sensory deficit. GCS eye subscore is 4. GCS verbal subscore is 5. GCS motor subscore is 6.  Skin: Skin is warm and dry. No abrasion and no rash noted.  Psychiatric: He has a normal mood and affect. His speech is normal and behavior is normal.  Nursing note and vitals reviewed.   ED Course  Procedures (including critical care time) Labs Review Labs Reviewed  URINE CULTURE  CBC WITH DIFFERENTIAL/PLATELET  URINALYSIS, ROUTINE W REFLEX MICROSCOPIC (NOT AT [facility])  PROTIME-INR  APTT  I-STAT CG4 LACTIC ACID, ED    Imaging Review Dg Chest 2 View  05/04/2015   CLINICAL DATA:  Hemoptysis.  EXAM: CHEST  2 VIEW  COMPARISON:  Apr 01, 2011.  FINDINGS: Stable cardiomegaly. No pneumothorax or pleural effusion is noted. Mild biapical scarring is again noted. Left lung is clear. Right middle lobe opacity is noted consistent with pneumonia or atelectasis. Bony thorax is intact.  IMPRESSION: Right middle lobe pneumonia or atelectasis is noted. Follow-up radiographs are recommended to ensure resolution.   Electronically Signed   By: Marijo Conception, M.D.   On: 05/04/2015 17:50     EKG Interpretation None      MDM  Patient started on IV antibody and will be admitted to the hospital    Lacretia Leigh, MD 05/04/15 2127

## 2015-05-04 NOTE — Progress Notes (Signed)
ANTIBIOTIC CONSULT NOTE - INITIAL  Pharmacy Consult for vancomycin, levaquin  Indication: pneumonia  Allergies  Allergen Reactions  . Penicillins Hives    Patient Measurements:   Adjusted Body Weight:   Vital Signs: Temp: 98.8 F (37.1 C) (06/17 1843) Temp Source: Oral (06/17 1843) BP: 178/101 mmHg (06/17 1843) Pulse Rate: 86 (06/17 1843) Intake/Output from previous day:   Intake/Output from this shift:    Labs:  Recent Labs  05/04/15 1725 05/04/15 2030  WBC  --  10.0  HGB 15.6 15.1  PLT  --  154  CREATININE 1.60*  --    Estimated Creatinine Clearance: 37.8 mL/min (by C-G formula based on Cr of 1.6). No results for input(s): VANCOTROUGH, VANCOPEAK, VANCORANDOM, GENTTROUGH, GENTPEAK, GENTRANDOM, TOBRATROUGH, TOBRAPEAK, TOBRARND, AMIKACINPEAK, AMIKACINTROU, AMIKACIN in the last 72 hours.   Microbiology: No results found for this or any previous visit (from the past 720 hour(s)).  Medical History: Past Medical History  Diagnosis Date  . Coronary artery disease     MI, atherectomy 1993  . Hyperlipidemia   . Arrhythmia     1995  . Atrial fibrillation   . History of transient ischemic attack (TIA) 2005    double vision  . Hypertension   . History of gallstones   . 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 Eczema   . Elevated LFTs   . Skin cancer   . DDD (degenerative disc disease)   . Pneumonia     long time ago  . Varicose veins     Medications:   (Not in a hospital admission)   Assessment: 81 yo male presents with hemopytsis on warfarin PTA. Incidentally found R lower lobe pneumonia. LA 2.11, no leukocytosis, afebrile, Scr 1.6, eCrCl ~ 35 ml/min.    Goal of Therapy:  Vancomycin trough level 15-20 mcg/ml  Plan:  Levaquin 750 mg IV q48h Vancomycin 1750 mg IV x1 load, then 1250 mg IV q24h Monitor renal fx, cultures, duration of therapy, VT at Css   Hughes Better, PharmD, BCPS Clinical Pharmacist Pager: [phone] 05/04/2015 9:12 PM

## 2015-05-04 NOTE — H&P (Signed)
[facility] History and Physical  Patient: Arthur [name]  MRN: [id number]  DOB: 19-Nov-1924  DOS: the patient was seen and examined on 05/04/2015 PCP: [name], MD  Referring physician: [facility] Chief Complaint: Shortness of breath and cough  HPI: Arthur [name] is a 81 y.o. male with Past medical history of coronary artery disease, atrial fibrillation on chronic anticoagulation, essential hypertension, varicose vein, chronic venous stasis with ulcers, chronic DVT of the leg, history of TIA, history of melanoma status post excision. The patient is presenting with complaints of cough and shortness of breath ongoing for last few days. Since last 2 days he also had some bloody sputum and therefore he decided to go to his PCPs office and was sent here for further workup. Patient denies any complaints of fever but complains of chills. Denies any chest pain nausea vomiting diarrhea active bleeding anywhere as. He denies any abdominal pain burning urination or any focal deficit as well. He has chronic venous stasis ulcer which was not healing and therefore he was seen by vascular surgery for laser coagulation of the varicosities but they decided to place compression stockings today secondary to swelling. Vascular surgery also did an ultrasound which was positive for chronic DVT. Patient comes of occasional dizziness on ambulation as well as significant fatigue and dyspnea on exertion. He also has cough with yellowish expectoration ongoing for last 3 weeks. He mentions in May he has visited [place] as well as a also had Cataract surgery and later on was at [place]. A few days later his shortness of breath started. He mentions during this time. He was compliant with his Coumadin and there was no other recent change in his medications.  The patient is coming from home. And at his baseline independent for most of his ADL.  Review of Systems: as mentioned in the history of  present illness.  A comprehensive review of the other systems is negative.  Past Medical History  Diagnosis Date  . Coronary artery disease     MI, atherectomy 1993  . Hyperlipidemia   . Arrhythmia     1995  . Atrial fibrillation   . History of transient ischemic attack (TIA) 2005    double vision  . Hypertension   . History of gallstones   . 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 Eczema   . Elevated LFTs   . Skin cancer   . DDD (degenerative disc disease)   . Pneumonia     long time ago  . Varicose veins    Past Surgical History  Procedure Laterality Date  . Cardiac catheterization  04/08/99    LAD 70% INTER 40-50% RCA 30-40%  . Appendectomy    . Coronary angioplasty  04/08/99    LAD  . Coronary angioplasty with stent placement  04/08/99    LAD  . Coronary angioplasty  10/17/97    RCA   . Melanoma excision  2013    lt arm  . Tonsillectomy    . Amputation Right 05/10/2013    Procedure: RIGHT SECOND TOE AMPUTATION;  Surgeon: Hessie Dibble, MD;  Location: [place];  Service: Orthopedics;  Laterality: Right;  . Melanoma excision Left 08/23/2013    Procedure: MELANOMA EXCISION;  Surgeon: Pedro Earls, MD;  Location: [facility];  Service: General;  Laterality: Left;   Social History:  reports that he quit smoking about 47 years ago. His smoking use included Cigarettes and Cigars. He  has never used smokeless tobacco. He reports that he drinks alcohol. He reports that he does not use illicit drugs.  Allergies  Allergen Reactions  . Penicillins Hives    Family History  Problem Relation Age of Onset  . Heart attack Mother 44    MI  . Pneumonia Father     Prior to Admission medications   Medication Sig Start Date End Date Taking? Authorizing Provider  furosemide (LASIX) 20 MG tablet Take one tablet (20 mg) by mouth once daily as needed for swelling 04/18/15  Yes Sherren Mocha, MD  losartan (COZAAR) 50 MG tablet TAKE 1 TABLET (50 MG TOTAL) BY MOUTH  DAILY. 10/16/14  Yes Sherren Mocha, MD  metoprolol tartrate (LOPRESSOR) 25 MG tablet TAKE 1 TABLET DAILY 01/11/15  Yes Sherren Mocha, MD  predniSONE (DELTASONE) 10 MG tablet Take 2 tablets (20 mg total) by mouth daily with breakfast. 05/03/15  Yes Irene Shipper, MD  warfarin (COUMADIN) 5 MG tablet TAKE AS DIRECTED BY COUMADIN CLINIC. Patient taking differently: Take 5 mg on Thurs / Fri / Sat. Take 2.5 mg on all other days 04/17/15  Yes Sherren Mocha, MD    Physical Exam: Filed Vitals:   05/04/15 2145 05/04/15 2200 05/04/15 2215 05/04/15 2325  BP: 139/80 148/81 125/92 155/90  Pulse: 77 89 81 85  Temp:    97.5 F (36.4 C)  TempSrc:    Oral  Resp: 20 13 11 18   Height:    6\' 2"  (1.88 m)  SpO2: 97% 97% 99% 100%    General: Alert, Awake and Oriented to Time, Place and Person. Appear in moderate distress Eyes: PERRL ENT: Oral Mucosa clear moist. Neck: no JVD Cardiovascular: S1 and S2 Present, aortic systolic Murmur, Peripheral Pulses Present Respiratory: Bilateral Air entry equal and Decreased,  Bilateral basal Crackles, expiratory wheezes Abdomen: Bowel Sound present, Soft and non tender Skin: no Rash Extremities: Bilateral Pedal edema, difficult to assess calf tenderness due to compression stockings Neurologic: Grossly no focal neuro deficit.  Labs on Admission:  CBC:  Recent Labs Lab 05/04/15 1725 05/04/15 2030  WBC  --  10.0  NEUTROABS  --  5.2  HGB 15.6 15.1  HCT 46.0 44.4  MCV  --  100.0  PLT  --  154    CMP     Component Value Date/Time   NA 133* 05/04/2015 2030   NA 140 04/24/2015 0844   K 4.2 05/04/2015 2030   K 5.1 04/24/2015 0844   CL 98* 05/04/2015 2030   CO2 27 05/04/2015 2030   CO2 27 04/24/2015 0844   GLUCOSE 185* 05/04/2015 2030   GLUCOSE 125 04/24/2015 0844   BUN 36* 05/04/2015 2030   BUN 29.4* 04/24/2015 0844   CREATININE 1.62* 05/04/2015 2030   CREATININE 1.4* 04/24/2015 0844   CREATININE 1.51* 04/20/2013 1639   CALCIUM 8.7* 05/04/2015 2030     CALCIUM 8.6 04/24/2015 0844   PROT 6.7 05/04/2015 2030   PROT 6.3* 04/24/2015 0844   ALBUMIN 3.1* 05/04/2015 2030   ALBUMIN 3.2* 04/24/2015 0844   AST 35 05/04/2015 2030   AST 37* 04/24/2015 0844   ALT 34 05/04/2015 2030   ALT 45 04/24/2015 0844   ALKPHOS 67 05/04/2015 2030   ALKPHOS 80 04/24/2015 0844   BILITOT 1.5* 05/04/2015 2030   BILITOT 1.08 04/24/2015 0844   GFRNONAA 37* 05/04/2015 2030   GFRNONAA 42* 04/20/2013 1639   GFRAA 42* 05/04/2015 2030   GFRAA 48* 04/20/2013 1639    No results  for input(s): LIPASE, AMYLASE in the last 168 hours.  No results for input(s): CKTOTAL, CKMB, CKMBINDEX, TROPONINI in the last 168 hours. BNP (last 3 results) No results for input(s): BNP in the last 8760 hours.  ProBNP (last 3 results) No results for input(s): PROBNP in the last 8760 hours.   Radiological Exams on Admission: Dg Chest 2 View  05/04/2015   CLINICAL DATA:  Hemoptysis.  EXAM: CHEST  2 VIEW  COMPARISON:  Apr 01, 2011.  FINDINGS: Stable cardiomegaly. No pneumothorax or pleural effusion is noted. Mild biapical scarring is again noted. Left lung is clear. Right middle lobe opacity is noted consistent with pneumonia or atelectasis. Bony thorax is intact.  IMPRESSION: Right middle lobe pneumonia or atelectasis is noted. Follow-up radiographs are recommended to ensure resolution.   Electronically Signed   By: Marijo Conception, M.D.   On: 05/04/2015 17:50    Assessment/Plan Principal Problem:   CAP (community acquired pneumonia) Active Problems:   Essential hypertension   Coronary atherosclerosis   Atrial fibrillation   h/o TIA (transient ischemic attack)   Current use of long term anticoagulation   Melanoma of skin   Chronic cutaneous venous stasis ulcer   Leg DVT (deep venous thromboembolism), chronic   1. CAP (community acquired pneumonia) The patient is presenting with complaints of cough which is ongoing for last 3 weeks as well as associated dyspnea on exertion with  recent worsening with hemoptysis. He had a chest x-ray which is positive for right middle lobe pneumonia. With this findings the patient will be admitted in telemetry. I would use incentive spirometry as well as a flutter device. I would put him on IV antibiotics with vancomycin and Levaquin which can be narrowed down later. I would also monitor him on telemetry. Follow cultures. Due to presence of hemoptysis I would also check a VQ scan in the morning to rule out any new PE.  2. Chronic venous stasis with DVT. Currently continuing warfarin at home dose since INR is therapeutic throughout the April may June.  3. Chronic cutaneous venous stasis ulcer. Patient following up with vascular surgery as an outpatient. Continue regular dressing.  4. Expiratory wheezing with possible obstructive disease. The patient will be planned this dose of prednisone as well as DuoNeb's.  5. Essential hypertension. Holding his blood pressure medication at present. Can be resumed later on once her blood pressure is more stable.  Advance goals of care discussion: Full code as per my discussion with patient. Patient's son will be his power of attorney   DVT Prophylaxis: subcutaneous Heparin Nutrition: Cardiac diet  Family Communication: family was present at bedside, opportunity was given to ask question and all questions were answered satisfactorily at the time of interview. Disposition: Admitted as inpatient, telemetry unit.  Author: Berle Mull, MD [facility] Pager: [phone] 05/04/2015  If 7PM-7AM, please contact night-coverage [email or web address] Password [email or web address]

## 2015-05-04 NOTE — Telephone Encounter (Signed)
Spoke with patient and he states he had shaking chills last week. He coughed up bright, red blood in sputum yesterday and today.  He is concerned the Prednisone is causing this. Patient is on Coumadin. Patient instructed to call cardiology also. Spoke with Nicoletta Ba, PA and patient should go to ED if cardiology does not call back to be evaluated for symptoms. Patient aware. He is talking with cardiology now.

## 2015-05-04 NOTE — Progress Notes (Signed)
Patient name: Arthur [name] MRN: [id number] DOB: August 31, 1925 Sex: male   Referred by: Dr. Burt Knack  Reason for referral:  Chief Complaint  Patient presents with  . New Evaluation    bilateral lower extremity varicose veins  referred by Dr Burt Knack    HISTORY OF PRESENT ILLNESS: The patient comes in today for evaluation of bilateral leg ulcers and leg swelling.  His right leg wound has nearly healed.  He has a relatively new sore on the lateral left leg.  He has had chronic leg swelling which she states gets better in the morning after sleeping but is worse at the end of the day.  He occasionally wears compression stockings.  The patient has a history of coronary artery disease.  He is status post MI and subsequent stenting.  He is on Coumadin for atrial fibrillation.  He suffers from hypercholesterolemia but is not on a statin secondary to an increase in his LFTs, for which he is taking prednisone.  He suffers from hypertension which is managed with an ARB.  He also has a history of melanoma  Past Medical History  Diagnosis Date  . Coronary artery disease     MI, atherectomy 1993  . Hyperlipidemia   . Arrhythmia     1995  . Atrial fibrillation   . History of transient ischemic attack (TIA) 2005    double vision  . Hypertension   . History of gallstones   . 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 Eczema   . Elevated LFTs   . Skin cancer   . DDD (degenerative disc disease)   . Pneumonia     long time ago  . Varicose veins     Past Surgical History  Procedure Laterality Date  . Cardiac catheterization  04/08/99    LAD 70% INTER 40-50% RCA 30-40%  . Appendectomy    . Coronary angioplasty  04/08/99    LAD  . Coronary angioplasty with stent placement  04/08/99    LAD  . Coronary angioplasty  10/17/97    RCA   . Melanoma excision  2013    lt arm  . Tonsillectomy    . Amputation Right 05/10/2013    Procedure: RIGHT SECOND TOE AMPUTATION;  Surgeon: Hessie Dibble, MD;   Location: [place];  Service: Orthopedics;  Laterality: Right;  . Melanoma excision Left 08/23/2013    Procedure: MELANOMA EXCISION;  Surgeon: Pedro Earls, MD;  Location: [facility];  Service: General;  Laterality: Left;    History   Social History  . Marital Status: Married    Spouse Name: N/A  . Number of Children: N/A  . Years of Education: N/A   Occupational History  . Not on file.   Social History Main Topics  . Smoking status: Former Smoker    Types: Cigarettes, Cigars    Quit date: 11/18/1967  . Smokeless tobacco: Never Used     Comment: 1-2 cigars a day  . Alcohol Use: 0.0 oz/week    0 Standard drinks or equivalent per week     Comment: 2-3 glasses of wine in the evening  . Drug Use: No  . Sexual Activity: Not Currently   Other Topics Concern  . Not on file   Social History Narrative    Family History  Problem Relation Age of Onset  . Heart attack Mother 49    MI  . Pneumonia Father     Allergies as  of 05/04/2015 - Review Complete 05/04/2015  Allergen Reaction Noted  . Penicillins Hives     Current Outpatient Prescriptions on File Prior to Visit  Medication Sig Dispense Refill  . furosemide (LASIX) 20 MG tablet Take one tablet (20 mg) by mouth once daily as needed for swelling 30 tablet 2  . losartan (COZAAR) 50 MG tablet TAKE 1 TABLET (50 MG TOTAL) BY MOUTH DAILY. 90 tablet 2  . metoprolol tartrate (LOPRESSOR) 25 MG tablet TAKE 1 TABLET DAILY 90 tablet 1  . predniSONE (DELTASONE) 10 MG tablet Take 2 tablets (20 mg total) by mouth daily with breakfast. 100 tablet 3  . warfarin (COUMADIN) 5 MG tablet TAKE AS DIRECTED BY COUMADIN CLINIC. 30 tablet 2  . DUREZOL 0.05 % EMUL Place one drop into the right eye three times a day  1   No current facility-administered medications on file prior to visit.     REVIEW OF SYSTEMS: Cardiovascular: No chest pain, chest pressure, palpitations, orthopnea, or dyspnea on exertion.  Positive for pain in  legs on walking.  Positive for leg swelling Pulmonary: No productive cough, asthma or wheezing. Neurologic: No weakness, paresthesias, aphasia, or amaurosis. No dizziness. Hematologic: No bleeding problems or clotting disorders. Musculoskeletal: No joint pain or joint swelling. Gastrointestinal: No blood in stool or hematemesis Genitourinary: No dysuria or hematuria. Psychiatric:: No history of major depression. Integumentary:  Positive for bilateral leg ulcers Constitutional: No fever or chills.  PHYSICAL EXAMINATION:  Filed Vitals:   05/04/15 1019  BP: 110/59  Pulse: 71  Resp: 20  Height: 6\' 2"  (1.88 m)  Weight: 205 lb (92.987 kg)   Body mass index is 26.31 kg/(m^2). General: The patient appears their stated age.   HEENT:  No gross abnormalities Pulmonary: Respirations are non-labored Abdomen: Soft and non-tender  Musculoskeletal: There are no major deformities.   Neurologic: No focal weakness or paresthesias are detected, Skin: 2 x 1 cm lateral left leg ulcer with pink granulation tissue.  Small 1 cm lesion on the anterior right leg.  Chronic skin changes bilaterally Psychiatric: The patient has normal affect. Cardiovascular: Irregular area 2+ pitting edema bilaterally  Diagnostic Studies: I have reviewed his venous reflux evaluation.  There is no evidence of DVT.  There is minimally occlusive chronic thrombus within the distal right great saphenous, proximal calf saphenous and small saphenous.  The patient was found to have venous insufficiency in bilateral deep systems as well as bilateral great and small saphenous veins.    Assessment:  Bilateral chronic venous insufficiency with bilateral ulceration Plan: I discussed placing the patient in Unna boots bilaterally to see if he can get his wounds to heal.  He will follow-up to discuss possible endovenous laser ablation of the saphenous veins to decrease the risk of ulcer recurrence.  The patient will need lifetime  compression.     Eldridge Abrahams, M.D. [facility] Office: [phone] Pager:  [phone]

## 2015-05-04 NOTE — ED Provider Notes (Signed)
CSN: [id number]     Arrival date & time 05/04/15  1523 History   First MD Initiated Contact with Patient 05/04/15 1605     Chief Complaint  Patient presents with  . Hemoptysis   (Consider location/radiation/quality/duration/timing/severity/associated sxs/prior Treatment) HPI Comments: 81 year old man states that he has been coughing up blood for 2 days. He denies having a frequent cough per se. He denies PND, fever, shortness of breath, chest pain. He denies feeling ill. He has a history of atrial fibrillation and CAD and takes Coumadin on a daily basis. His history also includes TIA, hypertension and elevated LFTs and pneumonia.   Past Medical History  Diagnosis Date  . Coronary artery disease     MI, atherectomy 1993  . Hyperlipidemia   . Arrhythmia     1995  . Atrial fibrillation   . History of transient ischemic attack (TIA) 2005    double vision  . Hypertension   . History of gallstones   . 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 Eczema   . Elevated LFTs   . Skin cancer   . DDD (degenerative disc disease)   . Pneumonia     long time ago  . Varicose veins    Past Surgical History  Procedure Laterality Date  . Cardiac catheterization  04/08/99    LAD 70% INTER 40-50% RCA 30-40%  . Appendectomy    . Coronary angioplasty  04/08/99    LAD  . Coronary angioplasty with stent placement  04/08/99    LAD  . Coronary angioplasty  10/17/97    RCA   . Melanoma excision  2013    lt arm  . Tonsillectomy    . Amputation Right 05/10/2013    Procedure: RIGHT SECOND TOE AMPUTATION;  Surgeon: Hessie Dibble, MD;  Location: [place];  Service: Orthopedics;  Laterality: Right;  . Melanoma excision Left 08/23/2013    Procedure: MELANOMA EXCISION;  Surgeon: Pedro Earls, MD;  Location: [facility];  Service: General;  Laterality: Left;   Family History  Problem Relation Age of Onset  . Heart attack Mother 74    MI  . Pneumonia Father    History  Substance Use  Topics  . Smoking status: Former Smoker    Types: Cigarettes, Cigars    Quit date: 11/18/1967  . Smokeless tobacco: Never Used     Comment: 1-2 cigars a day  . Alcohol Use: 0.0 oz/week    0 Standard drinks or equivalent per week     Comment: 2-3 glasses of wine in the evening    Review of Systems  Constitutional: Negative for fever, activity change and fatigue.  HENT: Negative.   Respiratory: Negative for cough, chest tightness, shortness of breath and wheezing.   Cardiovascular: Positive for leg swelling. Negative for chest pain.  Gastrointestinal: Negative.   Genitourinary: Negative.   Musculoskeletal: Negative.   Hematological: Bruises/bleeds easily.    Allergies  Penicillins  Home Medications   Prior to Admission medications   Medication Sig Start Date End Date Taking? Authorizing Provider  DUREZOL 0.05 % EMUL Place one drop into the right eye three times a day 03/15/15   Historical Provider, MD  furosemide (LASIX) 20 MG tablet Take one tablet (20 mg) by mouth once daily as needed for swelling 04/18/15   Sherren Mocha, MD  losartan (COZAAR) 50 MG tablet TAKE 1 TABLET (50 MG TOTAL) BY MOUTH DAILY. 10/16/14   Sherren Mocha, MD  metoprolol tartrate (LOPRESSOR)  25 MG tablet TAKE 1 TABLET DAILY 01/11/15   Sherren Mocha, MD  predniSONE (DELTASONE) 10 MG tablet Take 2 tablets (20 mg total) by mouth daily with breakfast. 05/03/15   Irene Shipper, MD  warfarin (COUMADIN) 5 MG tablet TAKE AS DIRECTED BY COUMADIN CLINIC. 04/17/15   Sherren Mocha, MD   BP 120/79 mmHg  Pulse 77  Temp(Src) 99 F (37.2 C) (Oral)  Resp 24  SpO2 99% Physical Exam  Constitutional: He is oriented to person, place, and time. He appears well-developed and well-nourished. No distress.  HENT:  Mouth/Throat: Oropharynx is clear and moist.  Eyes: Conjunctivae and EOM are normal.  Neck: Normal range of motion. Neck supple.  Cardiovascular: Normal rate and intact distal pulses.   Irregularly irregular   Pulmonary/Chest: Effort normal. No respiratory distress. He has no wheezes.  Throaty noise with inspiration, leswser with expiration  Musculoskeletal: He exhibits edema.  Chronic LE edema from PVD  Lymphadenopathy:    He has no cervical adenopathy.  Neurological: He is alert and oriented to person, place, and time. He exhibits normal muscle tone.  Skin: Skin is warm and dry.  Psychiatric: He has a normal mood and affect.  Nursing note and vitals reviewed.   ED Course  Procedures (including critical care time) Labs Review Labs Reviewed  POCT I-STAT, CHEM 8 - Abnormal; Notable for the following:    Chloride 100 (*)    BUN 36 (*)    Creatinine, Ser 1.60 (*)    All other components within normal limits    Imaging Review Dg Chest 2 View  05/04/2015   CLINICAL DATA:  Hemoptysis.  EXAM: CHEST  2 VIEW  COMPARISON:  Apr 01, 2011.  FINDINGS: Stable cardiomegaly. No pneumothorax or pleural effusion is noted. Mild biapical scarring is again noted. Left lung is clear. Right middle lobe opacity is noted consistent with pneumonia or atelectasis. Bony thorax is intact.  IMPRESSION: Right middle lobe pneumonia or atelectasis is noted. Follow-up radiographs are recommended to ensure resolution.   Electronically Signed   By: Marijo Conception, M.D.   On: 05/04/2015 17:50     MDM   1. Cough with hemoptysis   2. Right lower lobe pneumonia   3. Coronary artery disease involving native coronary artery of native heart with angina pectoris   4. Chronic atrial fibrillation    81 year old Arthur [name] ported to the emergency department via shuttle for evaluation of right middle lobe pneumonia in conjunction with hemoptysis, CAD, atrial fibrillation and treatment with Coumadin.    Janne Napoleon, NP 05/04/15 [id number]

## 2015-05-04 NOTE — ED Notes (Signed)
Reports coughing up blood.  Denies uri.  Patient does not notice anything different today.  Denies pain.   Reports physician office told patient to come to [facility] for evaluation

## 2015-05-04 NOTE — Telephone Encounter (Signed)
New Prob    Pt states he has been throwing up blood and requesting to speak to someone from our office. Please call.

## 2015-05-05 ENCOUNTER — Inpatient Hospital Stay (HOSPITAL_COMMUNITY): Payer: Medicare Other

## 2015-05-05 DIAGNOSIS — I82509 Chronic embolism and thrombosis of unspecified deep veins of unspecified lower extremity: Secondary | ICD-10-CM | POA: Diagnosis present

## 2015-05-05 DIAGNOSIS — Z7901 Long term (current) use of anticoagulants: Secondary | ICD-10-CM

## 2015-05-05 DIAGNOSIS — I83009 Varicose veins of unspecified lower extremity with ulcer of unspecified site: Secondary | ICD-10-CM | POA: Diagnosis present

## 2015-05-05 DIAGNOSIS — R042 Hemoptysis: Secondary | ICD-10-CM

## 2015-05-05 DIAGNOSIS — L97909 Non-pressure chronic ulcer of unspecified part of unspecified lower leg with unspecified severity: Secondary | ICD-10-CM

## 2015-05-05 DIAGNOSIS — K754 Autoimmune hepatitis: Secondary | ICD-10-CM

## 2015-05-05 LAB — COMPREHENSIVE METABOLIC PANEL
ALK PHOS: 61 U/L (ref 38–126)
ALT: 27 U/L (ref 17–63)
AST: 29 U/L (ref 15–41)
Albumin: 2.5 g/dL — ABNORMAL LOW (ref 3.5–5.0)
Anion gap: 7 (ref 5–15)
BILIRUBIN TOTAL: 1.2 mg/dL (ref 0.3–1.2)
BUN: 32 mg/dL — AB (ref 6–20)
CHLORIDE: 104 mmol/L (ref 101–111)
CO2: 25 mmol/L (ref 22–32)
Calcium: 8.4 mg/dL — ABNORMAL LOW (ref 8.9–10.3)
Creatinine, Ser: 1.4 mg/dL — ABNORMAL HIGH (ref 0.61–1.24)
GFR calc Af Amer: 51 mL/min — ABNORMAL LOW (ref >60)
GFR calc non Af Amer: 44 mL/min — ABNORMAL LOW (ref >60)
GLUCOSE: 112 mg/dL — AB (ref 65–99)
POTASSIUM: 4.2 mmol/L (ref 3.5–5.1)
SODIUM: 136 mmol/L (ref 135–145)
TOTAL PROTEIN: 5.6 g/dL — AB (ref 6.5–8.1)

## 2015-05-05 LAB — URINALYSIS, ROUTINE W REFLEX MICROSCOPIC
Bilirubin Urine: NEGATIVE
Glucose, UA: NEGATIVE mg/dL
HGB URINE DIPSTICK: NEGATIVE
KETONES UR: NEGATIVE mg/dL
Leukocytes, UA: NEGATIVE
Nitrite: NEGATIVE
PROTEIN: NEGATIVE mg/dL
SPECIFIC GRAVITY, URINE: 1.018 (ref 1.005–1.030)
UROBILINOGEN UA: 1 mg/dL (ref 0.0–1.0)
pH: 5 (ref 5.0–8.0)

## 2015-05-05 LAB — CBC WITH DIFFERENTIAL/PLATELET
Basophils Absolute: 0 10*3/uL (ref 0.0–0.1)
Basophils Relative: 0 % (ref 0–1)
Eosinophils Absolute: 0.1 10*3/uL (ref 0.0–0.7)
Eosinophils Relative: 1 % (ref 0–5)
HEMATOCRIT: 41.2 % (ref 39.0–52.0)
HEMOGLOBIN: 13.9 g/dL (ref 13.0–17.0)
LYMPHS PCT: 40 % (ref 12–46)
Lymphs Abs: 3.3 10*3/uL (ref 0.7–4.0)
MCH: 33.3 pg (ref 26.0–34.0)
MCHC: 33.7 g/dL (ref 30.0–36.0)
MCV: 98.8 fL (ref 78.0–100.0)
MONOS PCT: 14 % — AB (ref 3–12)
Monocytes Absolute: 1.1 10*3/uL — ABNORMAL HIGH (ref 0.1–1.0)
Neutro Abs: 3.7 10*3/uL (ref 1.7–7.7)
Neutrophils Relative %: 45 % (ref 43–77)
Platelets: 140 10*3/uL — ABNORMAL LOW (ref 150–400)
RBC: 4.17 MIL/uL — AB (ref 4.22–5.81)
RDW: 15 % (ref 11.5–15.5)
WBC: 8.1 10*3/uL (ref 4.0–10.5)

## 2015-05-05 LAB — PROTIME-INR
INR: 3.06 — ABNORMAL HIGH (ref 0.00–1.49)
PROTHROMBIN TIME: 31 s — AB (ref 11.6–15.2)

## 2015-05-05 LAB — ABO/RH: ABO/RH(D): O POS

## 2015-05-05 LAB — EXPECTORATED SPUTUM ASSESSMENT W REFEX TO RESP CULTURE

## 2015-05-05 LAB — STREP PNEUMONIAE URINARY ANTIGEN: Strep Pneumo Urinary Antigen: NEGATIVE

## 2015-05-05 MED ORDER — IPRATROPIUM-ALBUTEROL 0.5-2.5 (3) MG/3ML IN SOLN
3.0000 mL | Freq: Four times a day (QID) | RESPIRATORY_TRACT | Status: DC | PRN
  Administered 2015-05-06: 3 mL via RESPIRATORY_TRACT
  Filled 2015-05-05: qty 3

## 2015-05-05 MED ORDER — PREDNISONE 10 MG PO TABS
10.0000 mg | ORAL_TABLET | Freq: Every day | ORAL | Status: DC
  Administered 2015-05-06 – 2015-05-07 (×2): 10 mg via ORAL
  Filled 2015-05-05 (×3): qty 1

## 2015-05-05 MED ORDER — SODIUM CHLORIDE 0.9 % IV SOLN: Freq: Once | INTRAVENOUS | Status: AC

## 2015-05-05 MED ORDER — IPRATROPIUM-ALBUTEROL 0.5-2.5 (3) MG/3ML IN SOLN
3.0000 mL | RESPIRATORY_TRACT | Status: DC
  Administered 2015-05-05 (×4): 3 mL via RESPIRATORY_TRACT
  Filled 2015-05-05 (×4): qty 3

## 2015-05-05 MED ORDER — VITAMIN K1 10 MG/ML IJ SOLN
2.0000 mg | Freq: Once | INTRAMUSCULAR | Status: AC
  Administered 2015-05-05: 2 mg via INTRAVENOUS
  Filled 2015-05-05: qty 0.2

## 2015-05-05 MED ORDER — IOHEXOL 350 MG/ML SOLN
80.0000 mL | Freq: Once | INTRAVENOUS | Status: AC | PRN
  Administered 2015-05-05: 80 mL via INTRAVENOUS

## 2015-05-05 MED ORDER — METOPROLOL TARTRATE 50 MG PO TABS
50.0000 mg | ORAL_TABLET | Freq: Two times a day (BID) | ORAL | Status: DC
  Administered 2015-05-05 – 2015-05-06 (×4): 50 mg via ORAL
  Filled 2015-05-05 (×6): qty 1

## 2015-05-05 MED ORDER — IPRATROPIUM-ALBUTEROL 0.5-2.5 (3) MG/3ML IN SOLN: 3.0000 mL | Freq: Two times a day (BID) | RESPIRATORY_TRACT | Status: DC

## 2015-05-05 NOTE — Consult Note (Signed)
Name: Arthur [name] MRN: [id number] DOB: 16-Mar-1925    ADMISSION DATE:  05/04/2015 CONSULTATION DATE:  05/04/15  REFERRING MD :  Wynelle Cleveland  CHIEF COMPLAINT/reason for consultation:  hemoptysis  HISTORY OF PRESENT ILLNESS:  81yo CM currently on hospital day 2 after presentation to the ED for hemoptysis evaluation which started the day prior to the hospitalization. Patient states that he had one episode of such hemoptysis which she describes as acute onset 1 occurrence approximately 1.5 months ago. He states that this time the size of the bright red blood was approximately that of a quarter. He states that on Thursday he also had one similar episode and then Friday had 2 episodes of this. At that time he called his cardiologist's office at approximately 3 PM where he was directed to go to an urgent care center. He then went to an urgent care center where he was referred to the emergency department here at [facility]. He was admitted to the hospitalist service.  When asked about potential infectious etiologies the patient denies any fevers chills decreased appetite or increased shortness of breath. He denies any URI type symptoms. He is at his clinical baseline at this time and has been so for the last couple weeks. Patient recently took trips to both [place] and [place] without any issues to his health. Patient denies any epistaxis and also denies hematuria.  Patient did have a Styrofoam cup at the bedside with  the bright red blood in it. Patient states that he has not had any more episodes since getting the FFP and vitamin K.   PAST MEDICAL HISTORY :   has a past medical history of Coronary artery disease; Hyperlipidemia; Arrhythmia; Atrial fibrillation; History of transient ischemic attack (TIA) (2005); Hypertension; History of gallstones; Myocardial infarct (12/98); Eczema; Elevated LFTs; Skin cancer; DDD (degenerative disc disease); Pneumonia; and Varicose veins.  has past surgical  history that includes Cardiac catheterization (04/08/99); Appendectomy; Coronary angioplasty (04/08/99); Coronary angioplasty with stent (04/08/99); Coronary angioplasty (10/17/97); Melanoma excision (2013); Tonsillectomy; Amputation (Right, 05/10/2013); and Melanoma excision (Left, 08/23/2013). Prior to Admission medications   Medication Sig Start Date End Date Taking? Authorizing Provider  furosemide (LASIX) 20 MG tablet Take one tablet (20 mg) by mouth once daily as needed for swelling 04/18/15  Yes Sherren Mocha, MD  losartan (COZAAR) 50 MG tablet TAKE 1 TABLET (50 MG TOTAL) BY MOUTH DAILY. 10/16/14  Yes Sherren Mocha, MD  metoprolol tartrate (LOPRESSOR) 25 MG tablet TAKE 1 TABLET DAILY 01/11/15  Yes Sherren Mocha, MD  predniSONE (DELTASONE) 10 MG tablet Take 2 tablets (20 mg total) by mouth daily with breakfast. 05/03/15  Yes Irene Shipper, MD  warfarin (COUMADIN) 5 MG tablet TAKE AS DIRECTED BY COUMADIN CLINIC. Patient taking differently: Take 5 mg on Thurs / Fri / Sat. Take 2.5 mg on all other days 04/17/15  Yes Sherren Mocha, MD   Allergies  Allergen Reactions  . Penicillins Hives    FAMILY HISTORY:  family history includes Heart attack (age of onset: 40) in his mother; Pneumonia in his father. SOCIAL HISTORY:  reports that he quit smoking about 47 years ago. His smoking use included Cigarettes and Cigars. He has never used smokeless tobacco. He reports that he drinks alcohol. He reports that he does not use illicit drugs.  REVIEW OF SYSTEMS:   Constitutional: Negative for fever, chills, weight loss, malaise/fatigue and diaphoresis.  HENT: Negative for hearing loss, ear pain, nosebleeds, congestion, sore throat, neck pain, tinnitus and  ear discharge.   Eyes: Negative for blurred vision, double vision, photophobia, pain, discharge and redness.  Respiratory: Negative for cough, sputum production, shortness of breath, wheezing and stridor.  positive for hemoptysis  Cardiovascular: Negative  for chest pain, palpitations, orthopnea, claudication, and PND.  positive for leg swelling bilaterally.   Gastrointestinal: Negative for heartburn, nausea, vomiting, abdominal pain, diarrhea, constipation, blood in stool and melena.  Genitourinary: Negative for dysuria, urgency, frequency, hematuria and flank pain.  Musculoskeletal: Negative for myalgias, back pain, joint pain and falls.  Skin: Negative for itching and rash.  positive for venous stasis ulcer in bilateral shins  Neurological: Negative for dizziness, tingling, tremors, sensory change, speech change, focal weakness, seizures, loss of consciousness, weakness and headaches.  Endo/Heme/Allergies: Negative for environmental allergies and polydipsiapatient does bruise easily   SUBJECTIVE:   VITAL SIGNS: Temp:  [97.5 F (36.4 C)-98.2 F (36.8 C)] 98 F (36.7 C) (06/18 1440) Pulse Rate:  [60-89] 60 (06/18 1440) Resp:  [11-22] 16 (06/18 1440) BP: (123-155)/(62-92) 124/68 mmHg (06/18 1440) SpO2:  [96 %-100 %] 97 % (06/18 1651) Weight:  [203 lb 11.3 oz (92.4 kg)-203 lb 12.8 oz (92.443 kg)] 203 lb 11.3 oz (92.4 kg) (06/18 0551)  PHYSICAL EXAMINATION: General:   81 year old mawho appears younger than state agin no acute distress. He is alert, awake, oriented, appears well-nourished and well-developed.  Neuro:  Cranial nerves II-XII grossly intact. PERRL. EOMI. Sensation and strength. Reflexes. Cerebellum.  HEENT: Head, normocephalic, atraumatic. Ears symmetric, permeable. Eyes: no conjunctival icterus, no erythema. Nose: permeable, midline septum. Clear oropharynx  Cardiovascular: S1S2 , , irregularly irregular, positive systolic grade 2/6 murmur, rubs, or gallops auscultated. No thrills palpated.  Lungs:  Chest symmetrical with respirations, clear to auscultation bilaterally with no wheezing, crackles, equal expansion.  Abdomen:  Soft, nontender, no guarding, nondistended. Present bowel sounds. Musculoskeletal:  No muscle atrophy noted  nor weakness. ROM intact. No swelling nor tenderness of the joints.  Skin:  No notable scars, rashes, cruises.  patient is noted to have Ace bandages wrapped around bilateral shins and ankles. I did not remove these for the physical examination. Lymphatics: no palpable lymphadenopathy of cervical, supraclvicular nor inguinal areas.      Recent Labs Lab 05/04/15 1725 05/04/15 2030 05/05/15 0359  NA 136 133* 136  K 4.8 4.2 4.2  CL 100* 98* 104  CO2  --  27 25  BUN 36* 36* 32*  CREATININE 1.60* 1.62* 1.40*  GLUCOSE 93 185* 112*    Recent Labs Lab 05/04/15 1725 05/04/15 2030 05/05/15 0359  HGB 15.6 15.1 13.9  HCT 46.0 44.4 41.2  WBC  --  10.0 8.1  PLT  --  154 140*   Dg Chest 2 View  05/04/2015   CLINICAL DATA:  Hemoptysis.  EXAM: CHEST  2 VIEW  COMPARISON:  Apr 01, 2011.  FINDINGS: Stable cardiomegaly. No pneumothorax or pleural effusion is noted. Mild biapical scarring is again noted. Left lung is clear. Right middle lobe opacity is noted consistent with pneumonia or atelectasis. Bony thorax is intact.  IMPRESSION: Right middle lobe pneumonia or atelectasis is noted. Follow-up radiographs are recommended to ensure resolution.   Electronically Signed   By: Marijo Conception, M.D.   On: 05/04/2015 17:50   Ct Angio Chest Pe W/cm &/or Wo Cm  05/05/2015   CLINICAL DATA:  Hemoptysis. History of melanoma without evidence for metastatic disease at recent PET-CT 04/24/2015.  EXAM: CT ANGIOGRAPHY CHEST WITH CONTRAST  TECHNIQUE: Multidetector CT  imaging of the chest was performed using the standard protocol during bolus administration of intravenous contrast. Multiplanar CT image reconstructions and MIPs were obtained to evaluate the vascular anatomy.  CONTRAST:  42mL OMNIPAQUE IOHEXOL 350 MG/ML SOLN  COMPARISON:  Chest radiograph 05/04/2015, PET-CT 04/24/2015  FINDINGS: Mediastinum/Nodes: Moderate atheromatous aortic and coronary arterial calcification noted. Ascending aortic diameter 4.6 cm  transversely at the level of the main pulmonary artery image 42. Representative pretracheal node measures 0.8 cm short axis diameter image 34. Left atrial enlargement. Moderate cardiomegaly. Trace fluid in the superior pericardial recess.  The examination is adequate for evaluation for acute pulmonary embolism up to and including the 3rd order pulmonary arteries. Contrast bolus timing and inhomogeneity decreases sensitivity and specificity for detection of pulmonary embolism past and including the third order pulmonary arteries but no central filling defects identified to suggest pulmonary embolism.  Lungs/Pleura: Small right pleural effusion. Dense right lower lobe consolidation with air bronchogram formation and patchy left lower lobe consolidation.  Upper abdomen: Normal  Musculoskeletal: No acute osseous abnormality. Sclerosis within the T6 vertebral body most likely represents bone island, unchanged.  Review of the MIP images confirms the above findings.  IMPRESSION: Dense right greater than left lower lobe airspace consolidation with a pattern most typical for pneumonia. Aspiration or hemorrhage could appear similar, however.  No CT evidence for acute pulmonary embolism up to and including third order pulmonary arteries allowing for mild contrast bolus inhomogeneity.  Ascending aortic aneurysmal dilatation measuring 4.6 cm. Ascending thoracic aortic aneurysm. Recommend semi-annual imaging followup by CTA or MRA and referral to cardiothoracic surgery if not already obtained. This recommendation follows 2010 ACCF/AHA/AATS/ACR/ASA/SCA/SCAI/SIR/STS/SVM Guidelines for the Diagnosis and Management of Patients With Thoracic Aortic Disease. Circulation. 2010; 121: P824-M353   Electronically Signed   By: Conchita Paris M.D.   On: 05/05/2015 11:39    ASSESSMENT / PLAN: 1. Hemoptysis likely secondary to anticoagulation with warfarin.  - 2D echo from earlier this month reviewed with no mitral stenosis nor systolic  heart failure - per history and clinically it is dubious whether the patient has an active infection, CT scan with infiltrate on RLL.  antibiotics per hospitalist service.  - low index of suspicion for rheumatological/immunological etiologies at this point. Will defer workup at this time, UA with no RBCs. Could consider sending off testing for antinuclear antibodies, antineutrophil cytoplasmic antibodies, antiglomerular basement membrane antibodies, and/or anticardiolipin antibodies - CT of the chest does not show a malignant etiology w/no pulmonary nodules or cavitary opacities - no issues with oxygenation at this juncture - Negative CT PE protocol - agree with reversing warfarin with FFP and vitamin K - will consider bronchoscopy w/BAL but not needed emergently as the patient is on room air and comfortable.   - appreciate the consult. We will follow along.     Randa Lynn, MD [facility] [place] Pager: [phone]  05/05/2015, 6:47 PM

## 2015-05-05 NOTE — Progress Notes (Addendum)
[facility] Progress Note   Arthur [name] SHF:[id number] DOB: 03-May-1925 DOA: 05/04/2015 PCP: [name], MD  Brief narrative: Arthur [name] is a 81 y.o. male with A. fib on Coumadin, coronary artery disease, hypertension, chronic venous stasis, TIA, malignant melanoma in the past. Patient presents with 3 days of hemoptysis.   Subjective: Has not had hemoptysis since yesterday afternoon. No chest pain or dyspnea  Assessment/Plan: Principal Problem:   Hemoptysis -Chest x-ray in ER suggested a possibility of a pneumonia however, he has no signs or symptoms of pneumonia. Therefore, I obtained a CT of the chest which reveals consolidation which may be infection versus hemorrhage-I suspect this is hemorrhage-I have discussed this with Dr. Joya Gaskins from pulmonary and he agrees -Reversed his Coumadin and INR is less than 1.5 -Discontinued Levaquin and vancomycin started for pneumonia -Pulmonary set him up for bronchoscopy tomorrow  Active Problems:   Essential hypertension -Resume metoprolol-resume losartan today    Atrial fibrillation -Rate controlled -Continue metoprolol-reversed Coumadin    h/o TIA (transient ischemic attack) - Not on aspirin? May be suspected to be in relation to A. Fib    Melanoma of skin -Treated  Chronic kidney disease stage III Stable    Chronic cutaneous venous stasis ulcer/ chronic venous stasis -Currently has Unna boots as prescribed by vascular surgery    Autoimmune hepatitis treated with steroids -Being treated as outpatient by GI-Dr. Maylon Peppers home dose of prednisone at 10 mg daily   Code Status: Full code Family Communication:  Disposition Plan: Home when stable DVT prophylaxis: scds Consultants: Pulmonary Procedures:  Antibiotics: Anti-infectives    Start     Dose/Rate Route Frequency Ordered Stop   05/06/15 2100  levofloxacin (LEVAQUIN) IVPB 750 mg  Status:  Discontinued     750 mg 100 mL/hr over 90 Minutes  Intravenous Every 48 hours 05/04/15 2120 05/05/15 1241   05/05/15 2100  vancomycin (VANCOCIN) 1,250 mg in sodium chloride 0.9 % 250 mL IVPB     1,250 mg 166.7 mL/hr over 90 Minutes Intravenous Every 24 hours 05/04/15 2120     05/04/15 2130  vancomycin (VANCOCIN) 1,750 mg in sodium chloride 0.9 % 500 mL IVPB     1,750 mg 250 mL/hr over 120 Minutes Intravenous  Once 05/04/15 2103 05/04/15 2325   05/04/15 2045  levofloxacin (LEVAQUIN) IVPB 750 mg     750 mg 100 mL/hr over 90 Minutes Intravenous  Once 05/04/15 2039 05/04/15 2256   05/04/15 2045  vancomycin (VANCOCIN) IVPB 1000 mg/200 mL premix  Status:  Discontinued     1,000 mg 200 mL/hr over 60 Minutes Intravenous  Once 05/04/15 2039 05/04/15 2103      Objective: Filed Weights   05/04/15 2325 05/05/15 0551  Weight: 92.443 kg (203 lb 12.8 oz) 92.4 kg (203 lb 11.3 oz)   No intake or output data in the 24 hours ending 05/05/15 1247   Vitals Filed Vitals:   05/05/15 0815 05/05/15 0944 05/05/15 1216 05/05/15 1232  BP: 144/76  128/73   Pulse: 72  86   Temp:      TempSrc:      Resp:      Height:      Weight:      SpO2:  96%  96%    Exam:  General:  Pt is alert, not in acute distress  HEENT: No icterus, No thrush, oral mucosa moist  Cardiovascular: regular rate and rhythm, S1/S2 No murmur  Respiratory: clear to auscultation bilaterally   Abdomen: Soft, +  Bowel sounds, non tender, non distended, no guarding  MSK: Lower extremities wrapped and SCDs  Data Reviewed: Basic Metabolic Panel:  Recent Labs Lab 05/04/15 1725 05/04/15 2030 05/05/15 0359  NA 136 133* 136  K 4.8 4.2 4.2  CL 100* 98* 104  CO2  --  27 25  GLUCOSE 93 185* 112*  BUN 36* 36* 32*  CREATININE 1.60* 1.62* 1.40*  CALCIUM  --  8.7* 8.4*   Liver Function Tests:  Recent Labs Lab 05/04/15 2030 05/05/15 0359  AST 35 29  ALT 34 27  ALKPHOS 67 61  BILITOT 1.5* 1.2  PROT 6.7 5.6*  ALBUMIN 3.1* 2.5*   No results for input(s): LIPASE, AMYLASE in  the last 168 hours. No results for input(s): AMMONIA in the last 168 hours. CBC:  Recent Labs Lab 05/04/15 1725 05/04/15 2030 05/05/15 0359  WBC  --  10.0 8.1  NEUTROABS  --  5.2 3.7  HGB 15.6 15.1 13.9  HCT 46.0 44.4 41.2  MCV  --  100.0 98.8  PLT  --  154 140*   Cardiac Enzymes: No results for input(s): CKTOTAL, CKMB, CKMBINDEX, TROPONINI in the last 168 hours. BNP (last 3 results) No results for input(s): BNP in the last 8760 hours.  ProBNP (last 3 results) No results for input(s): PROBNP in the last 8760 hours.  CBG: No results for input(s): GLUCAP in the last 168 hours.  No results found for this or any previous visit (from the past 240 hour(s)).   Studies: Dg Chest 2 View  05/04/2015   CLINICAL DATA:  Hemoptysis.  EXAM: CHEST  2 VIEW  COMPARISON:  Apr 01, 2011.  FINDINGS: Stable cardiomegaly. No pneumothorax or pleural effusion is noted. Mild biapical scarring is again noted. Left lung is clear. Right middle lobe opacity is noted consistent with pneumonia or atelectasis. Bony thorax is intact.  IMPRESSION: Right middle lobe pneumonia or atelectasis is noted. Follow-up radiographs are recommended to ensure resolution.   Electronically Signed   By: Marijo Conception, M.D.   On: 05/04/2015 17:50   Ct Angio Chest Pe W/cm &/or Wo Cm  05/05/2015   CLINICAL DATA:  Hemoptysis. History of melanoma without evidence for metastatic disease at recent PET-CT 04/24/2015.  EXAM: CT ANGIOGRAPHY CHEST WITH CONTRAST  TECHNIQUE: Multidetector CT imaging of the chest was performed using the standard protocol during bolus administration of intravenous contrast. Multiplanar CT image reconstructions and MIPs were obtained to evaluate the vascular anatomy.  CONTRAST:  87mL OMNIPAQUE IOHEXOL 350 MG/ML SOLN  COMPARISON:  Chest radiograph 05/04/2015, PET-CT 04/24/2015  FINDINGS: Mediastinum/Nodes: Moderate atheromatous aortic and coronary arterial calcification noted. Ascending aortic diameter 4.6 cm  transversely at the level of the main pulmonary artery image 42. Representative pretracheal node measures 0.8 cm short axis diameter image 34. Left atrial enlargement. Moderate cardiomegaly. Trace fluid in the superior pericardial recess.  The examination is adequate for evaluation for acute pulmonary embolism up to and including the 3rd order pulmonary arteries. Contrast bolus timing and inhomogeneity decreases sensitivity and specificity for detection of pulmonary embolism past and including the third order pulmonary arteries but no central filling defects identified to suggest pulmonary embolism.  Lungs/Pleura: Small right pleural effusion. Dense right lower lobe consolidation with air bronchogram formation and patchy left lower lobe consolidation.  Upper abdomen: Normal  Musculoskeletal: No acute osseous abnormality. Sclerosis within the T6 vertebral body most likely represents bone island, unchanged.  Review of the MIP images confirms the above findings.  IMPRESSION:  Dense right greater than left lower lobe airspace consolidation with a pattern most typical for pneumonia. Aspiration or hemorrhage could appear similar, however.  No CT evidence for acute pulmonary embolism up to and including third order pulmonary arteries allowing for mild contrast bolus inhomogeneity.  Ascending aortic aneurysmal dilatation measuring 4.6 cm. Ascending thoracic aortic aneurysm. Recommend semi-annual imaging followup by CTA or MRA and referral to cardiothoracic surgery if not already obtained. This recommendation follows 2010 ACCF/AHA/AATS/ACR/ASA/SCA/SCAI/SIR/STS/SVM Guidelines for the Diagnosis and Management of Patients With Thoracic Aortic Disease. Circulation. 2010; 121: R427-C623   Electronically Signed   By: Conchita Paris M.D.   On: 05/05/2015 11:39    Scheduled Meds:  Scheduled Meds: . sodium chloride   Intravenous Once  . ipratropium-albuterol  3 mL Nebulization Q4H  . metoprolol tartrate  50 mg Oral BID  .  phytonadione (VITAMIN K) IV  2 mg Intravenous Once  . [START ON 05/06/2015] predniSONE  10 mg Oral Q breakfast  . vancomycin  1,250 mg Intravenous Q24H   Continuous Infusions:   Time spent on care of this patient: 35 min   [place], MD 05/05/2015, 12:47 PM  LOS: 1 day   [facility] Office  [phone] Pager - Text Page per [email or web address] If 7PM-7AM, please contact night-coverage [email or web address]

## 2015-05-05 NOTE — Progress Notes (Signed)
Pt still coughing up a small amount of blood, md notified, will continue to monitor.

## 2015-05-05 NOTE — Progress Notes (Addendum)
Pt had 5 beats of VTach.  Pt asymptomatic, VSS.  NP Rogue Bussing paged/notified.  New orders received.  Will continue to monitor pt closely.  Claudette Stapler, RN

## 2015-05-05 NOTE — Progress Notes (Signed)
Administered 1 unit of FFP to pt, pt had no complaints/no signs of transfusion reaction.  Completed at 1438.  Will continue to monitor.

## 2015-05-06 DIAGNOSIS — R042 Hemoptysis: Secondary | ICD-10-CM

## 2015-05-06 LAB — PROTIME-INR
INR: 1.46 (ref 0.00–1.49)
Prothrombin Time: 17.8 seconds — ABNORMAL HIGH (ref 11.6–15.2)

## 2015-05-06 LAB — URINE CULTURE: Culture: NO GROWTH

## 2015-05-06 LAB — CBC
HCT: 37.4 % — ABNORMAL LOW (ref 39.0–52.0)
Hemoglobin: 12.7 g/dL — ABNORMAL LOW (ref 13.0–17.0)
MCH: 33.2 pg (ref 26.0–34.0)
MCHC: 34 g/dL (ref 30.0–36.0)
MCV: 97.7 fL (ref 78.0–100.0)
Platelets: 151 10*3/uL (ref 150–400)
RBC: 3.83 MIL/uL — ABNORMAL LOW (ref 4.22–5.81)
RDW: 14.9 % (ref 11.5–15.5)
WBC: 7.5 10*3/uL (ref 4.0–10.5)

## 2015-05-06 LAB — BASIC METABOLIC PANEL
ANION GAP: 6 (ref 5–15)
BUN: 24 mg/dL — ABNORMAL HIGH (ref 6–20)
CO2: 25 mmol/L (ref 22–32)
CREATININE: 1.15 mg/dL (ref 0.61–1.24)
Calcium: 8.3 mg/dL — ABNORMAL LOW (ref 8.9–10.3)
Chloride: 106 mmol/L (ref 101–111)
GFR, EST NON AFRICAN AMERICAN: 55 mL/min — AB (ref >60)
GLUCOSE: 122 mg/dL — AB (ref 65–99)
Potassium: 4.4 mmol/L (ref 3.5–5.1)
Sodium: 137 mmol/L (ref 135–145)

## 2015-05-06 LAB — MAGNESIUM: Magnesium: 2 mg/dL (ref 1.7–2.4)

## 2015-05-06 LAB — PREPARE FRESH FROZEN PLASMA: UNIT DIVISION: 0

## 2015-05-06 LAB — SEDIMENTATION RATE: Sed Rate: 50 mm/hr — ABNORMAL HIGH (ref 0–16)

## 2015-05-06 NOTE — Progress Notes (Signed)
Name: Arthur [name] MRN: [id number] DOB: 19-Sep-1925    ADMISSION DATE:  05/04/2015 CONSULTATION DATE:  05/04/15  REFERRING MD :  Wynelle Cleveland  CHIEF COMPLAINT/reason for consultation:  hemoptysis  Brief Patient summary: 81 y.o.M with hemoptysis and bilateral LL pulm hemorrhage with hx of melanoma and neg recent PET scan.  On warfarin with therapeutic INR.  Prior episode hemoptysis 1.5 mo ago  SUBJECTIVE:  Min hemoptysis  VITAL SIGNS: Temp:  [97.7 F (36.5 C)-98.2 F (36.8 C)] 97.7 F (36.5 C) (06/19 0602) Pulse Rate:  [55-86] 63 (06/19 0602) Resp:  [16-18] 18 (06/19 0602) BP: (123-152)/(63-83) 152/83 mmHg (06/19 0602) SpO2:  [96 %-98 %] 98 % (06/19 0602) Weight:  [91.581 kg (201 lb 14.4 oz)] 91.581 kg (201 lb 14.4 oz) (06/19 0602)  PHYSICAL EXAMINATION: General:   81 year old mawho appears younger than state agin no acute distress. He is alert, awake, oriented, appears well-nourished and well-developed.  Neuro:  Cranial nerves II-XII grossly intact. PERRL. EOMI. Sensation and strength. Reflexes. Cerebellum.  HEENT: Head, normocephalic, atraumatic. Ears symmetric, permeable. Eyes: no conjunctival icterus, no erythema. Nose: permeable, midline septum. Clear oropharynx  Cardiovascular: S1S2 , , irregularly irregular, positive systolic grade 2/6 murmur, rubs, or gallops auscultated. No thrills palpated.  Lungs:  Chest symmetrical with respirations, clear to auscultation bilaterally with no wheezing, crackles, equal expansion.  Abdomen:  Soft, nontender, no guarding, nondistended. Present bowel sounds. Musculoskeletal:  No muscle atrophy noted nor weakness. ROM intact. No swelling nor tenderness of the joints.  Skin:  No notable scars, rashes, cruises.  patient is noted to have Ace bandages wrapped around bilateral shins and ankles. I did not remove these for the physical examination. Lymphatics: no palpable lymphadenopathy of cervical, supraclvicular nor inguinal areas.      Recent  Labs Lab 05/04/15 2030 05/05/15 0359 05/06/15 0428  NA 133* 136 137  K 4.2 4.2 4.4  CL 98* 104 106  CO2 27 25 25   BUN 36* 32* 24*  CREATININE 1.62* 1.40* 1.15  GLUCOSE 185* 112* 122*    Recent Labs Lab 05/04/15 2030 05/05/15 0359 05/06/15 0428  HGB 15.1 13.9 12.7*  HCT 44.4 41.2 37.4*  WBC 10.0 8.1 7.5  PLT 154 140* 151   Dg Chest 2 View  05/04/2015   CLINICAL DATA:  Hemoptysis.  EXAM: CHEST  2 VIEW  COMPARISON:  Apr 01, 2011.  FINDINGS: Stable cardiomegaly. No pneumothorax or pleural effusion is noted. Mild biapical scarring is again noted. Left lung is clear. Right middle lobe opacity is noted consistent with pneumonia or atelectasis. Bony thorax is intact.  IMPRESSION: Right middle lobe pneumonia or atelectasis is noted. Follow-up radiographs are recommended to ensure resolution.   Electronically Signed   By: Marijo Conception, M.D.   On: 05/04/2015 17:50   Ct Angio Chest Pe W/cm &/or Wo Cm  05/05/2015   CLINICAL DATA:  Hemoptysis. History of melanoma without evidence for metastatic disease at recent PET-CT 04/24/2015.  EXAM: CT ANGIOGRAPHY CHEST WITH CONTRAST  TECHNIQUE: Multidetector CT imaging of the chest was performed using the standard protocol during bolus administration of intravenous contrast. Multiplanar CT image reconstructions and MIPs were obtained to evaluate the vascular anatomy.  CONTRAST:  22m OMNIPAQUE IOHEXOL 350 MG/ML SOLN  COMPARISON:  Chest radiograph 05/04/2015, PET-CT 04/24/2015  FINDINGS: Mediastinum/Nodes: Moderate atheromatous aortic and coronary arterial calcification noted. Ascending aortic diameter 4.6 cm transversely at the level of the main pulmonary artery image 42. Representative pretracheal node measures 0.8 cm  short axis diameter image 34. Left atrial enlargement. Moderate cardiomegaly. Trace fluid in the superior pericardial recess.  The examination is adequate for evaluation for acute pulmonary embolism up to and including the 3rd order pulmonary  arteries. Contrast bolus timing and inhomogeneity decreases sensitivity and specificity for detection of pulmonary embolism past and including the third order pulmonary arteries but no central filling defects identified to suggest pulmonary embolism.  Lungs/Pleura: Small right pleural effusion. Dense right lower lobe consolidation with air bronchogram formation and patchy left lower lobe consolidation.  Upper abdomen: Normal  Musculoskeletal: No acute osseous abnormality. Sclerosis within the T6 vertebral body most likely represents bone island, unchanged.  Review of the MIP images confirms the above findings.  IMPRESSION: Dense right greater than left lower lobe airspace consolidation with a pattern most typical for pneumonia. Aspiration or hemorrhage could appear similar, however.  No CT evidence for acute pulmonary embolism up to and including third order pulmonary arteries allowing for mild contrast bolus inhomogeneity.  Ascending aortic aneurysmal dilatation measuring 4.6 cm. Ascending thoracic aortic aneurysm. Recommend semi-annual imaging followup by CTA or MRA and referral to cardiothoracic surgery if not already obtained. This recommendation follows 2010 ACCF/AHA/AATS/ACR/ASA/SCA/SCAI/SIR/STS/SVM Guidelines for the Diagnosis and Management of Patients With Thoracic Aortic Disease. Circulation. 2010; 121: E174-Y814   Electronically Signed   By: Conchita Paris M.D.   On: 05/05/2015 11:39    ASSESSMENT / PLAN: 1. Hemoptysis exacerbated by coumadin however this alone not enough to indicate cause. Infiltrates in lung are due to pulm hemorrhage, Note hx of melanoma.  ? Endobronchial disease?, doubt vasculitis.  - 2D echo from earlier this month reviewed with no mitral stenosis nor systolic heart failure - per history and clinically it is dubious whether the patient has an active infection, CT scan with infiltrate on RLL.    - low index of suspicion for rheumatological/immunological etiologies at this  point. But will order esr, ana, anca.  - CT of the chest does not show a malignant etiology w/no pulmonary nodules or cavitary opacities - no issues with oxygenation at this juncture, however cannot r/o endobronchial disease  - Negative CT PE protocol  - warfarin has been reversed  -Pt needs a  bronchoscopy w/BAL , will schedule for AM 05/07/15-NPO after MN  Mariel Sleet Beeper  [phone]  Cell  [phone]  If no response or cell goes to voicemail, call beeper [phone] 9:22 AM 05/06/2015

## 2015-05-07 ENCOUNTER — Inpatient Hospital Stay (HOSPITAL_COMMUNITY): Payer: Medicare Other

## 2015-05-07 ENCOUNTER — Encounter (HOSPITAL_COMMUNITY)
Admission: EM | Disposition: A | Discharge status: Home / Self Care | Payer: Self-pay | Source: Home / Self Care | Attending: Internal Medicine

## 2015-05-07 ENCOUNTER — Encounter: Payer: Self-pay | Admitting: Vascular Surgery

## 2015-05-07 ENCOUNTER — Encounter (HOSPITAL_COMMUNITY): Payer: Self-pay | Admitting: Pulmonary Disease

## 2015-05-07 DIAGNOSIS — I714 Abdominal aortic aneurysm, without rupture, unspecified: Secondary | ICD-10-CM | POA: Insufficient documentation

## 2015-05-07 DIAGNOSIS — I712 Thoracic aortic aneurysm, without rupture: Secondary | ICD-10-CM

## 2015-05-07 DIAGNOSIS — I4891 Unspecified atrial fibrillation: Secondary | ICD-10-CM

## 2015-05-07 DIAGNOSIS — I481 Persistent atrial fibrillation: Secondary | ICD-10-CM

## 2015-05-07 HISTORY — PX: VIDEO BRONCHOSCOPY: SHX5072

## 2015-05-07 LAB — LEGIONELLA ANTIGEN, URINE

## 2015-05-07 LAB — ANTINUCLEAR ANTIBODIES, IFA: ANA Ab, IFA: NEGATIVE

## 2015-05-07 SURGERY — VIDEO BRONCHOSCOPY WITHOUT FLUORO
Anesthesia: Moderate Sedation | Laterality: Bilateral

## 2015-05-07 MED ORDER — FENTANYL CITRATE (PF) 100 MCG/2ML IJ SOLN
INTRAMUSCULAR | Status: DC | PRN
  Administered 2015-05-07: 25 ug via INTRAVENOUS

## 2015-05-07 MED ORDER — FENTANYL CITRATE (PF) 100 MCG/2ML IJ SOLN
INTRAMUSCULAR | Status: AC
Start: 2015-05-07 — End: 2015-05-07
  Filled 2015-05-07: qty 4

## 2015-05-07 MED ORDER — LIDOCAINE HCL 2 % EX GEL
CUTANEOUS | Status: DC | PRN
  Administered 2015-05-07: 1

## 2015-05-07 MED ORDER — MIDAZOLAM HCL 5 MG/ML IJ SOLN
INTRAMUSCULAR | Status: AC
  Filled 2015-05-07: qty 2

## 2015-05-07 MED ORDER — LIDOCAINE HCL (PF) 1 % IJ SOLN
INTRAMUSCULAR | Status: DC | PRN
  Administered 2015-05-07: 6 mL

## 2015-05-07 MED ORDER — SODIUM CHLORIDE 0.9 % IV SOLN
INTRAVENOUS | Status: DC
  Administered 2015-05-07: 10:00:00 via INTRAVENOUS

## 2015-05-07 MED ORDER — PHENYLEPHRINE HCL 0.25 % NA SOLN
NASAL | Status: DC | PRN
  Administered 2015-05-07: 2 via NASAL

## 2015-05-07 MED ORDER — METOPROLOL TARTRATE 25 MG PO TABS
25.0000 mg | ORAL_TABLET | Freq: Two times a day (BID) | ORAL | Status: DC
  Filled 2015-05-07: qty 1

## 2015-05-07 MED ORDER — MIDAZOLAM HCL 10 MG/2ML IJ SOLN
INTRAMUSCULAR | Status: DC | PRN
  Administered 2015-05-07: 1 mg via INTRAVENOUS

## 2015-05-07 NOTE — Progress Notes (Signed)
Rizwan, MD made aware HR drops as low as 30bpm periodically.  HR is currently 50. Do you want to decrease metroprolol dose?

## 2015-05-07 NOTE — Op Note (Signed)
Indication: Hemoptysis  in the 81 -year-old ex smoker with h/o melanoma, on coumadin for AF  Written informed consent was obtained from the patient prior to the procedure. The risks of the procedure including coughing, bleeding and a small chance of lung cancer requiring a chest tube were discussed with the patient in great detail and evidenced understanding.  1 mg of Versed and  1mcg of fentanyl were used in divided doses during the procedure. Bronchoscope was inserted from the left Nare. The upper airway appeared normal. Vocal cord showed a small ulcer on the posterior & inferior aspect of right fold, normal motion. The trachea bronchial tree was then examined to the subsegmental level. Moderate bloody secretions were noted. No endobronchial lesions were noted. The mucosa was fribale & bled to suction.  Attention was then turned to the right lower lobe. Bronchoalveolar lavage was obtained from the right lower lobe with good return. The patient tolerated procedure well with minimal bleeding.   He was awake and alert at the end of the procedure.  Rigoberto Noel MD [phone]

## 2015-05-07 NOTE — Progress Notes (Signed)
UR Completed. Gemini Beaumier, RN, BSN.  [phone] 

## 2015-05-07 NOTE — Care Management Note (Addendum)
Case Management Note  Patient Details  Name: SOU NOHR MRN: [id number] Date of Birth: 04/10/1925  Subjective/Objective:   Pt admitted with Hemoptysis                 Action/Plan:  Pt is from home with wife.  CM will monitor for disposition needs   Expected Discharge Date:                  Expected Discharge Plan:  Home/Self Care  In-House Referral:     Discharge planning Services  CM Consult  Post Acute Care Choice:    Choice offered to:     DME Arranged:    DME Agency:     HH Arranged:    [place] Agency:     Status of Service:  Completed, signed off  Medicare Important Message Given:  Yes Date Medicare IM Given:  05/07/15 Medicare IM give by:  Elenor Quinones Date Additional Medicare IM Given:    Additional Medicare Important Message give by:      Disposition plan:  Home with self care  If discussed at Long Length of Stay Meetings, dates discussed:    Additional Comments: CM assessed pt, pt stated he is independent at home, the only equipment he needs is his treadmill.  NO CM needs at this time. Maryclare Labrador, RN 05/07/2015, 12:11 PM

## 2015-05-07 NOTE — Progress Notes (Signed)
Video bronchoscopy with intervention washing, pt. Did good thru out procedure all vitals good. Report called and given Francee Gentile RN.

## 2015-05-07 NOTE — Discharge Summary (Addendum)
Physician Discharge Summary  Arthur [name] AST:[id number] DOB: 06-12-1925 DOA: 05/04/2015  PCP: [name], MD  Admit date: 05/04/2015 Discharge date: 05/07/2015  Time spent: 50 minutes  Recommendations for Outpatient Follow-up:  1. Needs another CT scan in 6 mo for Aortic aneurysm 2. If hemoptysis does not resolve in 3-4 days, he is advised to call pulmonary for further recommendation- do not start Coumadin until hemoptysis resolves  Discharge Condition: stable Diet recommendation: heart healthy, low sodium  Discharge Diagnoses:  Principal Problem:   Hemoptysis Active Problems:   Essential hypertension   Coronary atherosclerosis   Atrial fibrillation   h/o TIA (transient ischemic attack)   Current use of long term anticoagulation   Melanoma of skin   Chronic cutaneous venous stasis ulcer   Autoimmune hepatitis treated with steroids   AAA (abdominal aortic aneurysm) without rupture- 4.6   History of present illness:  Arthur [name] is a 81 y.o. male with A. fib on Coumadin, coronary artery disease, hypertension, chronic venous stasis, TIA, malignant melanoma in the past. Patient presents with 3 days of hemoptysis.  Hospital Course:  Principal Problem:  Hemoptysis -Chest x-ray in ER suggested a possibility of a pneumonia however, he has no signs or symptoms of pneumonia. Therefore, I obtained a CT of the chest which reveals consolidation which may be infection versus hemorrhage-I suspect this is hemorrhage-I have discussed this with Dr. Joya Gaskins from pulmonary and he agrees  -Reversed his Coumadin and INR is less than 1.5 -Discontinued Levaquin and vancomycin which were started for pneumonia - underwent bronchoscopy today with revealed a vocal cord ulcer with blood in the airway- recommendations from Dr Elsworth Soho are to hold coumadin until hemoptysis resolves- he did cough up a small amount of blood this AM.   Active Problems: Acute blood loss anemia - Hb dropped from 15.6 to  12.7 - due to hemoptysis?   Essential hypertension -on Metoprolol and Losartan   Atrial fibrillation/ bradycardia -HR dropped into 30-40 range today- held Metoprolol this AM   h/o TIA (transient ischemic attack) - Not on aspirin? May be suspected to be in relation to A. Fib  Ascending Aortic aneurysm - 4.5 cm- repeat CT in 6 mo   Melanoma of skin -Treated  Chronic kidney disease stage III Stable   Chronic cutaneous venous stasis ulcer/ chronic venous stasis -Currently has Unna boots as prescribed by vascular surgery   Autoimmune hepatitis treated with steroids -Being treated as outpatient by GI-Dr. Maylon Peppers home dose of prednisone   Procedures:  Bronchoscopy-   Consultations:  Pulmonary   Discharge Exam: Filed Weights   05/06/15 0602 05/07/15 0750 05/07/15 0926  Weight: 91.581 kg (201 lb 14.4 oz) 91.128 kg (200 lb 14.4 oz) 90.719 kg (200 lb)   Filed Vitals:   05/07/15 1050  BP: 137/77  Pulse: 50  Temp:   Resp: 20    General: AAO x 3, no distress Cardiovascular: RRR, no murmurs  Respiratory: clear to auscultation bilaterally GI: soft, non-tender, non-distended, bowel sound positive  Discharge Instructions You were cared for by a hospitalist during your hospital stay. If you have any questions about your discharge medications or the care you received while you were in the hospital after you are discharged, you can call the unit and asked to speak with the hospitalist on call if the hospitalist that took care of you is not available. Once you are discharged, your primary care physician will handle any further medical issues. Please note that NO REFILLS for any  discharge medications will be authorized once you are discharged, as it is imperative that you return to your primary care physician (or establish a relationship with a primary care physician if you do not have one) for your aftercare needs so that they can reassess your need for medications and  monitor your lab values.  Discharge Instructions    Diet - low sodium heart healthy    Complete by:  As directed      Discharge instructions    Complete by:  As directed   Resume coumadin when you stop coughing up blood.     Increase activity slowly    Complete by:  As directed             Medication List    STOP taking these medications        warfarin 5 MG tablet  Commonly known as:  COUMADIN      TAKE these medications        furosemide 20 MG tablet  Commonly known as:  LASIX  Take one tablet (20 mg) by mouth once daily as needed for swelling     losartan 50 MG tablet  Commonly known as:  COZAAR  TAKE 1 TABLET (50 MG TOTAL) BY MOUTH DAILY.     metoprolol tartrate 25 MG tablet  Commonly known as:  LOPRESSOR  TAKE 1 TABLET DAILY     predniSONE 10 MG tablet  Commonly known as:  DELTASONE  Take 2 tablets (20 mg total) by mouth daily with breakfast.       Allergies  Allergen Reactions  . Penicillins Hives       Follow-up Information    Follow up with [facility], [place].   Specialty:  Orthopedic Surgery   Contact information:   [address] [place] [phone]        The results of significant diagnostics from this hospitalization (including imaging, microbiology, ancillary and laboratory) are listed below for reference.    Significant Diagnostic Studies: Dg Chest 2 View  05/04/2015   CLINICAL DATA:  Hemoptysis.  EXAM: CHEST  2 VIEW  COMPARISON:  Apr 01, 2011.  FINDINGS: Stable cardiomegaly. No pneumothorax or pleural effusion is noted. Mild biapical scarring is again noted. Left lung is clear. Right middle lobe opacity is noted consistent with pneumonia or atelectasis. Bony thorax is intact.  IMPRESSION: Right middle lobe pneumonia or atelectasis is noted. Follow-up radiographs are recommended to ensure resolution.   Electronically Signed   By: Marijo Conception, M.D.   On: 05/04/2015 17:50   Ct Angio Chest Pe W/cm &/or Wo Cm  05/05/2015    CLINICAL DATA:  Hemoptysis. History of melanoma without evidence for metastatic disease at recent PET-CT 04/24/2015.  EXAM: CT ANGIOGRAPHY CHEST WITH CONTRAST  TECHNIQUE: Multidetector CT imaging of the chest was performed using the standard protocol during bolus administration of intravenous contrast. Multiplanar CT image reconstructions and MIPs were obtained to evaluate the vascular anatomy.  CONTRAST:  15mL OMNIPAQUE IOHEXOL 350 MG/ML SOLN  COMPARISON:  Chest radiograph 05/04/2015, PET-CT 04/24/2015  FINDINGS: Mediastinum/Nodes: Moderate atheromatous aortic and coronary arterial calcification noted. Ascending aortic diameter 4.6 cm transversely at the level of the main pulmonary artery image 42. Representative pretracheal node measures 0.8 cm short axis diameter image 34. Left atrial enlargement. Moderate cardiomegaly. Trace fluid in the superior pericardial recess.  The examination is adequate for evaluation for acute pulmonary embolism up to and including the 3rd order pulmonary arteries. Contrast bolus timing and  inhomogeneity decreases sensitivity and specificity for detection of pulmonary embolism past and including the third order pulmonary arteries but no central filling defects identified to suggest pulmonary embolism.  Lungs/Pleura: Small right pleural effusion. Dense right lower lobe consolidation with air bronchogram formation and patchy left lower lobe consolidation.  Upper abdomen: Normal  Musculoskeletal: No acute osseous abnormality. Sclerosis within the T6 vertebral body most likely represents bone island, unchanged.  Review of the MIP images confirms the above findings.  IMPRESSION: Dense right greater than left lower lobe airspace consolidation with a pattern most typical for pneumonia. Aspiration or hemorrhage could appear similar, however.  No CT evidence for acute pulmonary embolism up to and including third order pulmonary arteries allowing for mild contrast bolus inhomogeneity.   Ascending aortic aneurysmal dilatation measuring 4.6 cm. Ascending thoracic aortic aneurysm. Recommend semi-annual imaging followup by CTA or MRA and referral to cardiothoracic surgery if not already obtained. This recommendation follows 2010 ACCF/AHA/AATS/ACR/ASA/SCA/SCAI/SIR/STS/SVM Guidelines for the Diagnosis and Management of Patients With Thoracic Aortic Disease. Circulation. 2010; 121: U272-Z366   Electronically Signed   By: Conchita Paris M.D.   On: 05/05/2015 11:39   Nm Pet Image Restage (ps) Whole Body  04/24/2015   CLINICAL DATA:  Subsequent treatment strategy for melanoma  EXAM: NUCLEAR MEDICINE PET WHOLE BODY  TECHNIQUE: 9.7 mCi F-18 FDG was injected intravenously. Full-ring PET imaging was performed from the vertex to the feet after the radiotracer. CT data was obtained and used for attenuation correction and anatomic localization.  FASTING BLOOD GLUCOSE:  Value:  118 mg/dl  COMPARISON:  07/29/2013  FINDINGS: Head/Neck: No hypermetabolic lymph nodes in the neck.  Chest: No hypermetabolic mediastinal or hilar nodes. Small right pleural effusion noted. No suspicious pulmonary nodules on the CT scan. Aortic atherosclerosis noted. There also calcifications within the left main coronary artery, LAD, left circumflex and RCA coronary arteries.  Abdomen/Pelvis: No abnormal hypermetabolic activity within the liver, pancreas, adrenal glands, or spleen. Gallstones are identified. No hypermetabolic lymph nodes in the abdomen or pelvis. Aortic atherosclerosis noted.  Skeleton: No focal hypermetabolic activity to suggest skeletal metastasis.  Extremities: No hypermetabolic activity to suggest metastasis.  IMPRESSION: 1. No definite size is just metastatic disease on today's exam. 2. Aortic atherosclerosis as well as left main and 3 vessel Coronary artery disease. 3. Gallstones.   Electronically Signed   By: Kerby Moors M.D.   On: 04/24/2015 11:38    Microbiology: Recent Results (from the past 240 hour(s))   Culture, blood (routine x 2)     Status: None (Preliminary result)   Collection Time: 05/04/15  8:55 PM  Result Value Ref Range Status   Specimen Description BLOOD RIGHT ARM  Final   Special Requests BOTTLES DRAWN AEROBIC AND ANAEROBIC 1ML  Final   Culture NO GROWTH 2 DAYS  Final   Report Status PENDING  Incomplete  Culture, blood (routine x 2)     Status: None (Preliminary result)   Collection Time: 05/04/15  9:00 PM  Result Value Ref Range Status   Specimen Description BLOOD LEFT ARM  Final   Special Requests BOTTLES DRAWN AEROBIC AND ANAEROBIC 1ML  Final   Culture NO GROWTH 2 DAYS  Final   Report Status PENDING  Incomplete  Urine culture     Status: None   Collection Time: 05/05/15  1:34 AM  Result Value Ref Range Status   Specimen Description URINE, CLEAN CATCH  Final   Special Requests NONE  Final   Culture NO GROWTH 1  DAY  Final   Report Status 05/06/2015 FINAL  Final  Culture, respiratory (NON-Expectorated)     Status: None (Preliminary result)   Collection Time: 05/05/15 10:05 AM  Result Value Ref Range Status   Specimen Description SPUTUM  Final   Special Requests NONE  Final   Gram Stain   Final    ABUNDANT WBC PRESENT,BOTH PMN AND MONONUCLEAR FEW SQUAMOUS EPITHELIAL CELLS PRESENT RARE GRAM POSITIVE COCCI IN CLUSTERS Performed at [facility]    Culture   Final    NORMAL OROPHARYNGEAL FLORA Performed at [facility]    Report Status PENDING  Incomplete  Culture, sputum-assessment     Status: None   Collection Time: 05/05/15 12:28 PM  Result Value Ref Range Status   Specimen Description SPUTUM  Final   Special Requests NONE  Final   Sputum evaluation   Final    THIS SPECIMEN IS ACCEPTABLE. RESPIRATORY CULTURE REPORT TO FOLLOW.   Report Status 05/05/2015 FINAL  Final     Labs: Basic Metabolic Panel:  Recent Labs Lab 05/04/15 1725 05/04/15 2030 05/05/15 0359 05/06/15 0101 05/06/15 0428  NA 136 133* 136  --  137  K 4.8 4.2 4.2  --   4.4  CL 100* 98* 104  --  106  CO2  --  27 25  --  25  GLUCOSE 93 185* 112*  --  122*  BUN 36* 36* 32*  --  24*  CREATININE 1.60* 1.62* 1.40*  --  1.15  CALCIUM  --  8.7* 8.4*  --  8.3*  MG  --   --   --  2.0  --    Liver Function Tests:  Recent Labs Lab 05/04/15 2030 05/05/15 0359  AST 35 29  ALT 34 27  ALKPHOS 67 61  BILITOT 1.5* 1.2  PROT 6.7 5.6*  ALBUMIN 3.1* 2.5*   No results for input(s): LIPASE, AMYLASE in the last 168 hours. No results for input(s): AMMONIA in the last 168 hours. CBC:  Recent Labs Lab 05/04/15 1725 05/04/15 2030 05/05/15 0359 05/06/15 0428  WBC  --  10.0 8.1 7.5  NEUTROABS  --  5.2 3.7  --   HGB 15.6 15.1 13.9 12.7*  HCT 46.0 44.4 41.2 37.4*  MCV  --  100.0 98.8 97.7  PLT  --  154 140* 151   Cardiac Enzymes: No results for input(s): CKTOTAL, CKMB, CKMBINDEX, TROPONINI in the last 168 hours. BNP: BNP (last 3 results) No results for input(s): BNP in the last 8760 hours.  ProBNP (last 3 results) No results for input(s): PROBNP in the last 8760 hours.  CBG: No results for input(s): GLUCAP in the last 168 hours.     SignedDebbe Odea, MD [facility] 05/07/2015, 12:06 PM

## 2015-05-07 NOTE — Progress Notes (Signed)
Name: Arthur [name] MRN: [id number] DOB: 1924/11/23    ADMISSION DATE:  05/04/2015 CONSULTATION DATE:  05/04/15  REFERRING MD :  Wynelle Cleveland  CHIEF COMPLAINT/reason for consultation:  hemoptysis  Brief Patient summary: 81 y.o.M with hemoptysis and bilateral LL pulm hemorrhage with hx of melanoma and neg recent PET scan.  On warfarin with therapeutic INR.  Prior episode hemoptysis 1.5 mo ago  SUBJECTIVE:  Minimal bright red blood in sputum this am Npo for procedure No chest pain, dyspnea  VITAL SIGNS: Temp:  [97.4 F (36.3 C)-97.9 F (36.6 C)] 97.7 F (36.5 C) (06/20 0926) Pulse Rate:  [52-69] 63 (06/20 1000) Resp:  [18-24] 23 (06/20 1000) BP: (125-200)/(67-112) 181/98 mmHg (06/20 1000) SpO2:  [93 %-98 %] 95 % (06/20 1000) Weight:  [90.719 kg (200 lb)-91.128 kg (200 lb 14.4 oz)] 90.719 kg (200 lb) (06/20 0926)  PHYSICAL EXAMINATION: General:   81 year old mawho appears younger than stated age,  no acute distress. He is alert, awake, oriented, appears well-nourished and well-developed.  Neuro:  Cranial nerves II-XII grossly intact. PERRL. EOMI. Sensation and strength. Reflexes. Cerebellum.  HEENT: Head, normocephalic, atraumatic. Ears symmetric, permeable. Eyes: no conjunctival icterus, no erythema. Nose: permeable, midline septum. Clear oropharynx  Cardiovascular: S1S2 , , irregularly irregular, positive systolic grade 2/6 murmur, rubs, or gallops auscultated. No thrills palpated.  Lungs:  Chest symmetrical with respirations, clear to auscultation bilaterally with no wheezing, crackles Abdomen:  Soft, nontender, no guarding, nondistended. Present bowel sounds. Musculoskeletal:  No muscle atrophy noted nor weakness. ROM intact. No swelling nor tenderness of the joints.  Skin:  No notable scars, rashes, cruises.  patient is noted to have Ace bandages wrapped around bilateral shins and ankles. I did not remove these for the physical examination. Lymphatics: no palpable lymphadenopathy  of cervical, supraclvicular nor inguinal areas.      Recent Labs Lab 05/04/15 2030 05/05/15 0359 05/06/15 0428  NA 133* 136 137  K 4.2 4.2 4.4  CL 98* 104 106  CO2 27 25 25   BUN 36* 32* 24*  CREATININE 1.62* 1.40* 1.15  GLUCOSE 185* 112* 122*    Recent Labs Lab 05/04/15 2030 05/05/15 0359 05/06/15 0428  HGB 15.1 13.9 12.7*  HCT 44.4 41.2 37.4*  WBC 10.0 8.1 7.5  PLT 154 140* 151   Ct Angio Chest Pe W/cm &/or Wo Cm  05/05/2015   CLINICAL DATA:  Hemoptysis. History of melanoma without evidence for metastatic disease at recent PET-CT 04/24/2015.  EXAM: CT ANGIOGRAPHY CHEST WITH CONTRAST  TECHNIQUE: Multidetector CT imaging of the chest was performed using the standard protocol during bolus administration of intravenous contrast. Multiplanar CT image reconstructions and MIPs were obtained to evaluate the vascular anatomy.  CONTRAST:  15m OMNIPAQUE IOHEXOL 350 MG/ML SOLN  COMPARISON:  Chest radiograph 05/04/2015, PET-CT 04/24/2015  FINDINGS: Mediastinum/Nodes: Moderate atheromatous aortic and coronary arterial calcification noted. Ascending aortic diameter 4.6 cm transversely at the level of the main pulmonary artery image 42. Representative pretracheal node measures 0.8 cm short axis diameter image 34. Left atrial enlargement. Moderate cardiomegaly. Trace fluid in the superior pericardial recess.  The examination is adequate for evaluation for acute pulmonary embolism up to and including the 3rd order pulmonary arteries. Contrast bolus timing and inhomogeneity decreases sensitivity and specificity for detection of pulmonary embolism past and including the third order pulmonary arteries but no central filling defects identified to suggest pulmonary embolism.  Lungs/Pleura: Small right pleural effusion. Dense right lower lobe consolidation with air bronchogram formation  and patchy left lower lobe consolidation.  Upper abdomen: Normal  Musculoskeletal: No acute osseous abnormality. Sclerosis  within the T6 vertebral body most likely represents bone island, unchanged.  Review of the MIP images confirms the above findings.  IMPRESSION: Dense right greater than left lower lobe airspace consolidation with a pattern most typical for pneumonia. Aspiration or hemorrhage could appear similar, however.  No CT evidence for acute pulmonary embolism up to and including third order pulmonary arteries allowing for mild contrast bolus inhomogeneity.  Ascending aortic aneurysmal dilatation measuring 4.6 cm. Ascending thoracic aortic aneurysm. Recommend semi-annual imaging followup by CTA or MRA and referral to cardiothoracic surgery if not already obtained. This recommendation follows 2010 ACCF/AHA/AATS/ACR/ASA/SCA/SCAI/SIR/STS/SVM Guidelines for the Diagnosis and Management of Patients With Thoracic Aortic Disease. Circulation. 2010; 121: L831-A742   Electronically Signed   By: Conchita Paris M.D.   On: 05/05/2015 11:39    ASSESSMENT / PLAN: 1. Hemoptysis exacerbated by coumadin - Infiltrates in lung are due to pulm hemorrhage, Note hx of melanoma.   -ESR 50 - 2D echo 04/2015 -no mitral stenosis , nml LV fn - per history and clinically it is dubious whether the patient has an active infection, CT scan with infiltrate on RLL.   - CT of the chest - w/no pulmonary nodules or cavitary opacities, no PE - warfarin has been reversed -bronchoscopy non diagnostic except for ulcer on vocal fold - no specific treatment  Low risk - only observation recommended - can discharge & resume coumadin in 3 days after hemoptysis resolved, can aim for low range of INR -Outpt pulmonary FU  [facility] V.MD  10:04 AM 05/07/2015

## 2015-05-08 ENCOUNTER — Encounter: Payer: Self-pay | Admitting: Vascular Surgery

## 2015-05-08 ENCOUNTER — Telehealth: Payer: Self-pay

## 2015-05-08 ENCOUNTER — Telehealth: Payer: Self-pay | Admitting: Critical Care Medicine

## 2015-05-08 ENCOUNTER — Telehealth: Payer: Self-pay | Admitting: Cardiovascular Disease

## 2015-05-08 ENCOUNTER — Ambulatory Visit (INDEPENDENT_AMBULATORY_CARE_PROVIDER_SITE_OTHER): Payer: Medicare Other | Admitting: Vascular Surgery

## 2015-05-08 VITALS — BP 116/69 | HR 86 | Resp 16 | Ht 74.0 in | Wt 200.0 lb

## 2015-05-08 DIAGNOSIS — I872 Venous insufficiency (chronic) (peripheral): Secondary | ICD-10-CM

## 2015-05-08 DIAGNOSIS — L97919 Non-pressure chronic ulcer of unspecified part of right lower leg with unspecified severity: Secondary | ICD-10-CM

## 2015-05-08 DIAGNOSIS — L97929 Non-pressure chronic ulcer of unspecified part of left lower leg with unspecified severity: Principal | ICD-10-CM

## 2015-05-08 DIAGNOSIS — I83029 Varicose veins of left lower extremity with ulcer of unspecified site: Secondary | ICD-10-CM

## 2015-05-08 LAB — CULTURE, RESPIRATORY W GRAM STAIN: Culture: NORMAL

## 2015-05-08 LAB — CULTURE, RESPIRATORY

## 2015-05-08 NOTE — Progress Notes (Signed)
Subjective:     Patient ID: Arthur [name], male   DOB: March 04, 1925, 81 y.o.   MRN: [id number]  HPI this 81 year old male was evaluated by Dr. Trula Slade last week for bilateral venous stasis ulcers which have been present for 6 months. He has been treated with lower extremity compression wraps by his dermatologist and his wife. The ulcers on the left have not changed significantly. The ulcer on the right has improved slightly. He has had bleeding episodes from the proximal ulcer in the left leg in the pretibial region. He has no history of DVT thrombophlebitis or bleeding on the right side. Currently he had an Unna boot was placed in our office today. He had venous reflux study performed in our office last week which was grossly abnormal signifying gross reflux in both great saphenous systems and small saphenous systems.  Past Medical History  Diagnosis Date  . Coronary artery disease     MI, atherectomy 1993  . Hyperlipidemia   . Arrhythmia     1995  . Atrial fibrillation   . History of transient ischemic attack (TIA) 2005    double vision  . Hypertension   . History of gallstones   . 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 Eczema   . Elevated LFTs   . Skin cancer   . DDD (degenerative disc disease)   . Pneumonia     long time ago  . Varicose veins     History  Substance Use Topics  . Smoking status: Former Smoker    Types: Cigarettes, Cigars    Quit date: 11/18/1967  . Smokeless tobacco: Never Used     Comment: 1-2 cigars a day  . Alcohol Use: 0.0 oz/week    0 Standard drinks or equivalent per week     Comment: 2-3 glasses of wine in the evening    Family History  Problem Relation Age of Onset  . Heart attack Mother 28    MI  . Pneumonia Father     Allergies  Allergen Reactions  . Penicillins Hives     Current outpatient prescriptions:  .  furosemide (LASIX) 20 MG tablet, Take one tablet (20 mg) by mouth once daily as needed for swelling, Disp: 30 tablet, Rfl: 2 .   losartan (COZAAR) 50 MG tablet, TAKE 1 TABLET (50 MG TOTAL) BY MOUTH DAILY., Disp: 90 tablet, Rfl: 2 .  metoprolol tartrate (LOPRESSOR) 25 MG tablet, TAKE 1 TABLET DAILY, Disp: 90 tablet, Rfl: 1 .  predniSONE (DELTASONE) 10 MG tablet, Take 2 tablets (20 mg total) by mouth daily with breakfast., Disp: 100 tablet, Rfl: 3  Filed Vitals:   05/08/15 1102  BP: 116/69  Pulse: 86  Resp: 16  Height: 6\' 2"  (1.88 m)  Weight: 200 lb (90.719 kg)    Body mass index is 25.67 kg/(m^2).           Review of Systems patient has remote history of MI in 1998 and had   stent placed at that time. Has been relatively asymptomatic from a cardiac standpoint since then. Have chronic atrial fib on anticoagulation--Coumadin Objective:   Physical Exam BP 116/69 mmHg  Pulse 86  Resp 16  Ht 6\' 2"  (1.88 m)  Wt 200 lb (90.719 kg)  BMI 25.67 kg/m2  General alert and oriented 3 in no apparent distress Left lower extremity has 2 stasis ulcers below the knee and the pretibial region each about 2-1/2-3 cm in diameter Right lower extremity has 1  stasis ulcer about 1-1-1/2 cm in diameter bilateral hyperpigmentation and is changes consistent with severe venous stasis disease.  Formal duplex exam last week revealed gross reflux in bilateral great saphenous veins and small saphenous veins with large caliber great saphenous veins     Assessment:     Bilateral venous stasis ulcers due to gross reflux bilateral great and small saphenous systems as well as some deep venous reflux. Ulcer on left has bled and ulcers have been present for 6 months treated with extrinsic compression dressings with no success    Plan:     Patient needs number-one laser ablation left great saphenous vein followed by #2 laser ablation right great saphenous vein to hopefully assist in healing these ulcers and prevent recurrence Believe we should not wait 3 months before proceeding with this because of his history of 6 months of having the  ulcers treated with elastic compression unsuccessfully We'll proceed with precertification to perform this in the near future

## 2015-05-08 NOTE — Telephone Encounter (Signed)
Pt scheduled Monday at 9:30.  Nothing further needed.

## 2015-05-08 NOTE — Telephone Encounter (Signed)
Sent prior authorization for Prednisone to Cover My Meds

## 2015-05-08 NOTE — Telephone Encounter (Signed)
Spoke with patient-aware that PW will be back in this office in the AM and he is currently with patients in [place] office; will forward message to [facility] so they are aware patient is eager to know results asap.

## 2015-05-08 NOTE — Telephone Encounter (Signed)
I spoke with the pt and he wanted to make Dr Burt Knack aware that he is currently off of Coumadin (FYI). The pt started coughing up blood last week and he went to the hospital. The CT did show a bleed in the lungs and his coumadin was reversed.  The pt had a bronchoscopy performed yesterday by Dr Elsworth Soho and is awaiting these results. The pt did cough up bright red blood this morning.  I made the pt aware that he needs to remain off of coumadin until cleared to resume by pulmonary.  I will forward this message to Dr Burt Knack as an Juluis Rainier as the pt requested.

## 2015-05-08 NOTE — Telephone Encounter (Signed)
i spoke to this pt pls add him on to my schedule at [facility] on Monday

## 2015-05-08 NOTE — Telephone Encounter (Signed)
New Message    Dr Joya Gaskins took pt off cumidine,, because pt was coughing up blood  Are you calling in regards to an appt?  NO  Are you calling in for refill? NO  Are you having bleeding issues? YES  Do you need clearance to hold coumadin? NO

## 2015-05-08 NOTE — Telephone Encounter (Signed)
Thank you :)

## 2015-05-08 NOTE — Telephone Encounter (Signed)
DC instructed pt to follow up with Card[facility] and [facility]

## 2015-05-09 LAB — CULTURE, BAL-QUANTITATIVE W GRAM STAIN

## 2015-05-09 LAB — CULTURE, BAL-QUANTITATIVE

## 2015-05-09 LAB — CULTURE, BLOOD (ROUTINE X 2)
CULTURE: NO GROWTH
Culture: NO GROWTH

## 2015-05-09 LAB — ANCA TITERS
C-ANCA: <1:20 {titer}
P-ANCA: <1:20 {titer}

## 2015-05-10 NOTE — Telephone Encounter (Signed)
Called [facility] to re-initiate prior authorization on Prednisone 10mg  twice a day; received approval, called [facility] to have them rerun the rx, and called patient to let him know.  He acknowledged and understood

## 2015-05-11 ENCOUNTER — Other Ambulatory Visit: Payer: Self-pay | Admitting: *Deleted

## 2015-05-11 ENCOUNTER — Encounter: Payer: Self-pay | Admitting: Vascular Surgery

## 2015-05-11 DIAGNOSIS — I83029 Varicose veins of left lower extremity with ulcer of unspecified site: Secondary | ICD-10-CM

## 2015-05-11 DIAGNOSIS — L97919 Non-pressure chronic ulcer of unspecified part of right lower leg with unspecified severity: Secondary | ICD-10-CM

## 2015-05-11 DIAGNOSIS — L97929 Non-pressure chronic ulcer of unspecified part of left lower leg with unspecified severity: Principal | ICD-10-CM

## 2015-05-11 DIAGNOSIS — I83019 Varicose veins of right lower extremity with ulcer of unspecified site: Secondary | ICD-10-CM

## 2015-05-14 ENCOUNTER — Ambulatory Visit (INDEPENDENT_AMBULATORY_CARE_PROVIDER_SITE_OTHER): Payer: Medicare Other | Admitting: Critical Care Medicine

## 2015-05-14 ENCOUNTER — Encounter: Payer: Self-pay | Admitting: Critical Care Medicine

## 2015-05-14 ENCOUNTER — Ambulatory Visit (INDEPENDENT_AMBULATORY_CARE_PROVIDER_SITE_OTHER)
Admission: RE | Admit: 2015-05-14 | Discharge: 2015-05-14 | Disposition: A | Discharge status: Home / Self Care | Payer: Medicare Other | Source: Ambulatory Visit | Attending: Critical Care Medicine | Admitting: Critical Care Medicine

## 2015-05-14 VITALS — BP 132/78 | HR 86 | Temp 97.5°F | Ht 74.0 in | Wt 205.2 lb

## 2015-05-14 DIAGNOSIS — R042 Hemoptysis: Secondary | ICD-10-CM | POA: Diagnosis not present

## 2015-05-14 MED ORDER — PREDNISONE 10 MG PO TABS: 10.0000 mg | ORAL_TABLET | Freq: Every day | ORAL | Status: DC

## 2015-05-14 NOTE — Patient Instructions (Addendum)
Stay off warfarin Reduce prednisone to 10mg  daily I will communicate with Dr Henrene Pastor and Burt Knack Return 2 weeks for recheck with our Nurse Practitioner Tammy Parrett

## 2015-05-14 NOTE — Progress Notes (Signed)
Subjective:    Patient ID: Arthur [name], male    DOB: 08-Mar-1925, 81 y.o.   MRN: [id number]  HPI 05/14/2015 Chief Complaint  Patient presents with  . [facility] Follow up    with cxr.  Hemoptysis has improved some but still present.  DOE unchanged.  No chest tightness, CP, or f/c/s.  This patient was hospitalized a week ago for onset hemoptysis. Bronchoscopy was performed and showed no endobronchial lesions but bloody secretions in right lower lobe. The patient was stopped on warfarin. Patient was not sent home on any anti-biotics. The patient has been on high-dose prednisone for autoimmune hepatitis in the past. Patient maintains prednisone 20 mg daily. Pt still some bright red blood each AM, none during the day.  No real chest pain on the R side.  Pt notices dyspena with exertion.  This is not new.  FIrst episode two months ago and stopped and then again two weeks ago.   On prednisone for two years at variable dosing.  Hx of autoimmune hepatitis.  Pt was on large dose of prednisone and maintained 6 mo ago.   Pt was in hosp for acute onset hemoptysis,  INR > 3 on warfarin.  Fob neg for endobronchial lesion.  Now off warfarin.  Hx of Afib  Pt denies any significant sore throat, nasal congestion or excess secretions, fever, chills, sweats, unintended weight loss, pleurtic or exertional chest pain, orthopnea PND, or leg swelling Pt denies any increase in rescue therapy over baseline, denies waking up needing it or having any early am or nocturnal exacerbations of coughing/wheezing/or dyspnea. Pt also denies any obvious fluctuation in symptoms with  weather or environmental change or other alleviating or aggravating factors  Current Medications, Allergies, Complete Past Medical History, Past Surgical History, Family History, and Social History were reviewed in [facility] record per todays encounter:  05/14/2015  Review of Systems  Constitutional: Negative.   HENT:  Negative.  Negative for ear pain, postnasal drip, rhinorrhea, sinus pressure, sore throat, trouble swallowing and voice change.   Eyes: Negative.   Respiratory: Positive for cough and shortness of breath. Negative for apnea, choking, chest tightness, wheezing and stridor.        Ongoing hemoptysis  Cardiovascular: Negative.  Negative for chest pain, palpitations and leg swelling.  Gastrointestinal: Negative.  Negative for nausea, vomiting, abdominal pain and abdominal distention.  Genitourinary: Negative.   Musculoskeletal: Negative.  Negative for myalgias and arthralgias.  Skin: Positive for wound. Negative for rash.  Allergic/Immunologic: Negative.  Negative for environmental allergies and food allergies.  Neurological: Negative.  Negative for dizziness, syncope, weakness and headaches.  Hematological: Negative.  Negative for adenopathy. Does not bruise/bleed easily.  Psychiatric/Behavioral: Negative.  Negative for sleep disturbance and agitation. The patient is not nervous/anxious.        Objective:   Physical Exam Filed Vitals:   05/14/15 0919  BP: 132/78  Pulse: 86  Temp: 97.5 F (36.4 C)  TempSrc: Oral  Height: 6\' 2"  (1.88 m)  Weight: 205 lb 3.2 oz (93.078 kg)  SpO2: 96%    Gen: Pleasant, well-nourished, in no distress,  normal affect  ENT: No lesions,  mouth clear,  oropharynx clear, no postnasal drip  Neck: No JVD, no TMG, no carotid bruits  Lungs: No use of accessory muscles, no dullness to percussion, clear without rales or rhonchi  Cardiovascular: RRR, heart sounds normal, no murmur or gallops, no peripheral edema  Abdomen: soft and NT, no HSM,  BS normal  Musculoskeletal: No deformities, no cyanosis or clubbing, una boot wrap on both LE  Neuro: alert, non focal  Skin: Warm, prominent ecchymoses seen in upper extremities   Dg Chest 2 View  05/14/2015   CLINICAL DATA:  81 year old male with 10 day history of hemoptysis. Recent diagnosis of right lower lobe  pneumonia.  EXAM: CHEST  2 VIEW  COMPARISON:  Prior chest x-ray 05/04/2015; prior CT PE study 05/05/2015  FINDINGS: Stable mild cardiomegaly. Atherosclerotic calcifications remain present throughout the aorta. No new mediastinal widening. Slightly improved but persistent patchy airspace disease in the right lower lobe with associated bronchial wall thickening. The remaining lungs remain clear. No acute osseous abnormality. Surgical clips present in the left axilla.  IMPRESSION: 1. Persistent but improving right lower lobe patchy airspace opacity which remains most consistent with a resolving infectious/inflammatory process. Recommend continued follow-up imaging to confirm complete resolution with repeat chest x-ray at 1 month. If airspace disease persists, a diffuse low grade adenocarcinoma would be difficult to exclude radiographically.   Electronically Signed   By: Jacqulynn Cadet M.D.   On: 05/14/2015 09:16          Assessment & Plan:  I personally reviewed all images and lab data in the [facility] system as well as any outside material available during this office visit and agree with the  radiology impressions.   Hemoptysis History of spontaneous hemoptysis secondary to airway friability in the setting of INR greater than 3 with warfarin use associated with previous history of liver disease and autoimmune hepatitis. Note patient's been on chronic steroid-dependent and this may also contribute to patient's friability of airway. Bronchoscopy did not show evidence for endobronchial lesion. Current chest x-ray does show persistent right lower lobe infiltrate but it is improving compared to CT scan from 1 week ago.  The patient has history of chronic atrial fibrillation however at this point the patient still is having active airway bleeding and lead to stay off warfarin for an indefinite period of time. Plan I will discuss the patient's prednisone dosing with gastroenterology but for now will reduce  prednisone dose to 10 mg daily and hold. Note recent liver function tests with past several months have stabilized compared to prior values in January 2016. Concern here would be ongoing high-dose steroid-induced in the setting of warfarin use and friable airways contributing to airway bleeding. If the patient continues to slow down and has cessation of airway bleeding we may consider reinstituting warfarin at a lower INR range of 1-1/2-2 compared to the current range of 2-1/2-3 if the patient fails that dose range may need to consider stopping warfarin altogether. No because of low creatinine clearance this patient is not a candidate for one of the newer oral and a quite yellow such as Xarelto or Eliquis  The patient will return in follow-up in 2 weeks with recheck with our nurse practitioner I will communicate with gastroenterology regarding the prednisone dosage adjustment    Monique was seen today for post hospital follow up.  Diagnoses and all orders for this visit:  Hemoptysis Orders: -     DG Chest 2 View; Future  Other orders -     predniSONE (DELTASONE) 10 MG tablet; Take 1 tablet (10 mg total) by mouth daily with breakfast.    I had an extended discussion with the patient and or family lasting 10 minutes of a 25 minute visit including:  Treatment options, current diagnosis, need to stay off warfarin for now.

## 2015-05-14 NOTE — Assessment & Plan Note (Signed)
History of spontaneous hemoptysis secondary to airway friability in the setting of INR greater than 3 with warfarin use associated with previous history of liver disease and autoimmune hepatitis. Note patient's been on chronic steroid-dependent and this may also contribute to patient's friability of airway. Bronchoscopy did not show evidence for endobronchial lesion. Current chest x-ray does show persistent right lower lobe infiltrate but it is improving compared to CT scan from 1 week ago.  The patient has history of chronic atrial fibrillation however at this point the patient still is having active airway bleeding and lead to stay off warfarin for an indefinite period of time. Plan I will discuss the patient's prednisone dosing with gastroenterology but for now will reduce prednisone dose to 10 mg daily and hold. Note recent liver function tests with past several months have stabilized compared to prior values in January 2016. Concern here would be ongoing high-dose steroid-induced in the setting of warfarin use and friable airways contributing to airway bleeding. If the patient continues to slow down and has cessation of airway bleeding we may consider reinstituting warfarin at a lower INR range of 1-1/2-2 compared to the current range of 2-1/2-3 if the patient fails that dose range may need to consider stopping warfarin altogether. No because of low creatinine clearance this patient is not a candidate for one of the newer oral and a quite yellow such as Xarelto or Eliquis  The patient will return in follow-up in 2 weeks with recheck with our nurse practitioner I will communicate with gastroenterology regarding the prednisone dosage adjustment

## 2015-05-15 ENCOUNTER — Encounter (INDEPENDENT_AMBULATORY_CARE_PROVIDER_SITE_OTHER): Payer: Medicare Other

## 2015-05-15 ENCOUNTER — Other Ambulatory Visit: Payer: Self-pay

## 2015-05-15 ENCOUNTER — Ambulatory Visit: Payer: Medicare Other | Admitting: Vascular Surgery

## 2015-05-15 ENCOUNTER — Telehealth: Payer: Self-pay

## 2015-05-15 DIAGNOSIS — R945 Abnormal results of liver function studies: Secondary | ICD-10-CM

## 2015-05-15 DIAGNOSIS — I872 Venous insufficiency (chronic) (peripheral): Secondary | ICD-10-CM | POA: Diagnosis not present

## 2015-05-15 DIAGNOSIS — R7989 Other specified abnormal findings of blood chemistry: Secondary | ICD-10-CM

## 2015-05-15 NOTE — Telephone Encounter (Signed)
Message     Fraser Din,    I reviewed your note. Okay to decrease prednisone to 10 mg daily. We will continue to monitor his liver tests regularly. Thanks    Barbette Merino, recheck liver tests in 4 weeks. Thanks        ----- Message -----     From: Elsie Stain, MD     Sent: 05/14/2015 11:44 AM      To: Irene Shipper, MD, Hendricks Limes, MD, *        New labs entered

## 2015-05-16 ENCOUNTER — Encounter: Payer: Self-pay | Admitting: Vascular Surgery

## 2015-05-22 ENCOUNTER — Ambulatory Visit (INDEPENDENT_AMBULATORY_CARE_PROVIDER_SITE_OTHER): Payer: Medicare Other

## 2015-05-22 DIAGNOSIS — I872 Venous insufficiency (chronic) (peripheral): Secondary | ICD-10-CM | POA: Diagnosis not present

## 2015-05-22 DIAGNOSIS — L97919 Non-pressure chronic ulcer of unspecified part of right lower leg with unspecified severity: Secondary | ICD-10-CM | POA: Insufficient documentation

## 2015-05-22 DIAGNOSIS — L97929 Non-pressure chronic ulcer of unspecified part of left lower leg with unspecified severity: Principal | ICD-10-CM

## 2015-05-22 DIAGNOSIS — I83029 Varicose veins of left lower extremity with ulcer of unspecified site: Secondary | ICD-10-CM

## 2015-05-22 NOTE — Progress Notes (Signed)
Arthur [name] was seen today for a bilateral unna boot change. The previous unna boot had been removed. New unna boots were applied. There was bleeding today from the proximal wound on his L leg. Pt stated he had hit it before coming. He will return on 7/11 for vein procedure. Chanda Maness-Harrison CMA, AAMA

## 2015-05-23 ENCOUNTER — Telehealth: Payer: Self-pay | Admitting: Critical Care Medicine

## 2015-05-23 NOTE — Telephone Encounter (Signed)
Spoke with pt. He is aware of PW's recommendations. Nothing further was needed at this time.

## 2015-05-23 NOTE — Telephone Encounter (Signed)
He can restart warfarin now that he has stopped bleeding HOWEVER, I DO NOT recommend ibuprofen with warfarin  i will let warfarin clinic know he can restart with goal INR 1.5- 2.0

## 2015-05-23 NOTE — Telephone Encounter (Signed)
Spoke with pt. States that hemoptysis has since stopped. The last day he coughed up blood was on Saturday. He is wondering if he needs to start back on Coumadin. Pt had an appointment with [facility] and was instructed to start taking Ibuprofen 200mg  3 tablets TID. He is concerned with restarting Coumadin along with taking the Ibuprofen.  PW - please advise. Thanks!

## 2015-05-24 ENCOUNTER — Encounter: Payer: Self-pay | Admitting: Cardiovascular Disease

## 2015-05-24 ENCOUNTER — Telehealth: Payer: Self-pay | Admitting: Cardiovascular Disease

## 2015-05-24 ENCOUNTER — Telehealth: Payer: Self-pay | Admitting: *Deleted

## 2015-05-24 ENCOUNTER — Encounter: Payer: Self-pay | Admitting: Vascular Surgery

## 2015-05-24 NOTE — Telephone Encounter (Signed)
Request for surgical clearance:  1. What type of surgery is being performed? Endovenous Laser Ablation  When is this surgery scheduled? 05/28/2015 2. Are there any medications that need to be held prior to surgery and how long? Coumadin for 5  3. Name of physician performing surgery? Dr. Kellie Simmering, [facility]inks   4. What is your office phone and fax number? [phone] Fax # [phone]

## 2015-05-24 NOTE — Telephone Encounter (Signed)
New Message    Rn calling back to speak to Rn Dr. Tinnie Gens  Please give her a call back about surgery

## 2015-05-24 NOTE — Telephone Encounter (Signed)
Fine to hold coumadin since he has been off of it anyway.

## 2015-05-24 NOTE — Telephone Encounter (Signed)
Spoke with pt.  Will restart Coumadin at lower dose of 2.5mg  daily except 5mg  on Monday, Wednesday and Friday.  Appt made to recheck INR on 7/15.

## 2015-05-24 NOTE — Telephone Encounter (Signed)
This encounter was created in error - please disregard.

## 2015-05-24 NOTE — Telephone Encounter (Signed)
I will forward this message to Dr Burt Knack for review.  Per this note they are planning to do procedure on 05/28/15. This would need to be rescheduled if the pt is given clearance to hold coumadin for 5 days. Per note in the pt's chart Gay Filler Pharm-D just spoke with the pt today about restarting coumadin.

## 2015-05-24 NOTE — Telephone Encounter (Signed)
Notified Arthur [name] that I had spoken with Lauren (Dr. Antionette Char nurse), and that he has surgical clearance for 05-28-2015 endovenous laser ablation procedure at [facility] with Dr. Tinnie Gens.  Instructed Arthur [name] to continue not taking Coumadin (has not taken Coumadin since 05-14-2015).  Instructed Arthur [name] not to restart Coumadin until after endovenous laser ablation procedure on 05-28-2015. Dr. Kellie Simmering will advise him on when to restart Coumadin post office surgery.  Arthur [name] verbalized understanding of these instructions.

## 2015-05-24 NOTE — Telephone Encounter (Signed)
Spoke with Arthur [name] (Dr. Antionette Char nurse) to request surgical clearance for office surgery on 05-28-2015 endovenous laser ablation of left greater saphenous vein to be performed by Tinnie Gens MD.  Arthur [name] has been off Coumadin since 05-14-2015.  Lauren states Arthur [name] will be cleared for endovenous laser ablation surgery on 05-28-2015, and Dr. Burt Knack will send verification through [facility].  Notified Lauren that I would be in telephone contact with Arthur [name]  this afternoon to continue to hold Coumadin until the day after the office surgery scheduled for 05-28-2015.

## 2015-05-24 NOTE — Telephone Encounter (Signed)
ERROR

## 2015-05-24 NOTE — Telephone Encounter (Signed)
I will forward this message to Moraga Rankin at [facility] as clearance to hold coumadin.

## 2015-05-28 ENCOUNTER — Ambulatory Visit (INDEPENDENT_AMBULATORY_CARE_PROVIDER_SITE_OTHER): Payer: Medicare Other | Admitting: Vascular Surgery

## 2015-05-28 ENCOUNTER — Encounter: Payer: Self-pay | Admitting: Vascular Surgery

## 2015-05-28 VITALS — BP 127/68 | HR 63 | Resp 16 | Ht 74.0 in | Wt 198.0 lb

## 2015-05-28 DIAGNOSIS — I872 Venous insufficiency (chronic) (peripheral): Secondary | ICD-10-CM

## 2015-05-28 DIAGNOSIS — L97919 Non-pressure chronic ulcer of unspecified part of right lower leg with unspecified severity: Secondary | ICD-10-CM

## 2015-05-28 DIAGNOSIS — L97929 Non-pressure chronic ulcer of unspecified part of left lower leg with unspecified severity: Principal | ICD-10-CM

## 2015-05-28 DIAGNOSIS — I83029 Varicose veins of left lower extremity with ulcer of unspecified site: Secondary | ICD-10-CM

## 2015-05-28 NOTE — Progress Notes (Signed)
Subjective:     Patient ID: Arthur [name], male   DOB: 1925-11-11, 81 y.o.   MRN: [id number]  HPI this 81 year old male had laser ablation left great saphenous vein from proximal calf to near the saphenofemoral junction performed under local tumescent anesthesia. He had gross reflux in the left great saphenous vein with active stasis ulcer. He tolerated the procedure well. A total of 2559 J of energy was utilized. Review of Systems     Objective:   Physical Exam BP 127/68 mmHg  Pulse 63  Resp 16  Ht 6\' 2"  (1.88 m)  Wt 198 lb (89.812 kg)  BMI 25.41 kg/m2       Assessment:     Well-tolerated laser ablation left great saphenous vein performed under local tumescent anesthesia for gross reflux with active venous stasis ulcer    Plan:     #1 resume Coumadin therapy Number to return in 1 week for a venous duplex exam to confirm closure left great saphenous vein #3 plan similar procedure on contralateral right leg in 2 weeks

## 2015-05-28 NOTE — Progress Notes (Signed)
Laser Ablation Procedure    Date: 05/28/2015   Arthur [name] [place] DOB:November 03, 1925  Consent signed: Yes    Surgeon:  Dr. Nelda Severe. Kellie Simmering  Procedure: Laser Ablation: left Greater Saphenous Vein  BP 127/68 mmHg  Pulse 63  Resp 16  Ht 6\' 2"  (1.88 m)  Wt 198 lb (89.812 kg)  BMI 25.41 kg/m2  Tumescent Anesthesia: 480 cc 0.9% NaCl with 50 cc Lidocaine HCL with 1% Epi and 15 cc 8.4% NaHCO3  Local Anesthesia: 6 cc Lidocaine HCL and NaHCO3 (ratio 2:1)  Pulsed Mode: 15 watts, 549ms delay, 1.0 duration  Total Energy:   2559           Total Pulses:   171             Total Time: 2:51    Patient tolerated procedure well  Notes:   Description of Procedure:  After marking the course of the secondary varicosities, the patient was placed on the operating table in the supine position, and the left leg was prepped and draped in sterile fashion.   Local anesthetic was administered and under ultrasound guidance the saphenous vein was accessed with a micro needle and guide wire; then the mirco puncture sheath was placed.  A guide wire was inserted saphenofemoral junction , followed by a 5 french sheath.  The position of the sheath and then the laser fiber below the junction was confirmed using the ultrasound.  Tumescent anesthesia was administered along the course of the saphenous vein using ultrasound guidance. The patient was placed in Trendelenburg position and protective laser glasses were placed on patient and staff, and the laser was fired at 15 watts continuous mode advancing 1-76mm/second for a total of 2559joules.     Steri strips were applied to the stab wounds and ABD pads and thigh high compression stockings were applied.  Ace wrap bandages were applied over the phlebectomy sites and at the top of the saphenofemoral junction. Blood loss was less than 15 cc.  The patient ambulated out of the operating room having tolerated the procedure well.

## 2015-05-29 ENCOUNTER — Telehealth: Payer: Self-pay | Admitting: *Deleted

## 2015-05-29 NOTE — Telephone Encounter (Signed)
Pt doing well. Having some discomfort at the top of his inner thgih/groin. Using ice. Unable to take Ibuprofen so using Tylenol. Following all instructions. Discussed his fu and his next laser appt. Follow prn.

## 2015-05-31 ENCOUNTER — Telehealth: Payer: Self-pay | Admitting: *Deleted

## 2015-05-31 ENCOUNTER — Encounter: Payer: Self-pay | Admitting: Vascular Surgery

## 2015-05-31 NOTE — Telephone Encounter (Signed)
[name] is s/p endovenous laser ablation left greater saphenous vein on 05-28-2015 by Dr. Tinnie Gens.  [name] is c/o pain in left inner thigh "knee-groin region" which is the area that was treated.  Explained to [name] that the laser ablation procedure created a large inflammatory response.  [name] is unable to take Ibuprofen (or any anti-inflammatory medications)  as he take Coumadin daily.  Explained to [name] that the left inner thigh pain was to be expected with the laser ablation procedure.  Encouraged him to elevate the left leg, use ice compresses to left inner thigh, keep compression bandage on his left thigh.  Reminded him of follow up appointment and ultrasound on 06-04-2015.  [name] will call if symptoms worsen or if he has further questions or concerns.

## 2015-06-01 ENCOUNTER — Telehealth: Payer: Self-pay | Admitting: Critical Care Medicine

## 2015-06-01 ENCOUNTER — Ambulatory Visit (INDEPENDENT_AMBULATORY_CARE_PROVIDER_SITE_OTHER): Payer: Medicare Other

## 2015-06-01 ENCOUNTER — Telehealth: Payer: Self-pay

## 2015-06-01 DIAGNOSIS — I4891 Unspecified atrial fibrillation: Secondary | ICD-10-CM | POA: Diagnosis not present

## 2015-06-01 LAB — POCT INR: INR: 1.1

## 2015-06-01 NOTE — Telephone Encounter (Signed)
Per RB >> continue coumadin at the same dose and call us over the weekend if the bleeding gets worse.  lmtcb x1 for pt.

## 2015-06-01 NOTE — Telephone Encounter (Signed)
PW pt. Pt is on Coumadin. Pt states he had procedure done on his leg last week and started back on his Coumadin on Tuesday. Pt says he spit up bright red blood this am he says " a drop". He went to Coumadin clinic this am and INR was 1.3. Says he was informed to call our office and see what he needs to do.  Please advise.

## 2015-06-01 NOTE — Telephone Encounter (Signed)
Pt seen in Coumadin clinic today, INR 1.1 after resuming Coumadin 4 days ago.  Pt had been off Coumadin for a couple of weeks prior to resuming secondary to hemoptysis initially, then continued to hold prior to procedure.  Pt states while off Coumadin hemoptsis resolved, but since restarting he now has a scant amt ("a drop of blood") in sputum per pt report.  Pt's INR is 1.1 explained to pt this is very thick, we are not seeing any Coumadin in his blood at this point and it would be unusual for Coumadin to be the cause of the blood in his sputum.  Advised pt I would forward a note to Dr Joya Gaskins, pt's pulmonologist to make him aware of the scant amt of blood returning to pt's sputum and have him call and advise of his recommendations.  Made pt return follow-up in Coumadin Clinic in 1 week for recheck on 06/08/15.

## 2015-06-01 NOTE — Telephone Encounter (Signed)
RA please advise as PW is out of the office. Thanks.

## 2015-06-01 NOTE — Telephone Encounter (Signed)
The patient has called in since [place] sent [place] this message. Message was routed to RA and was not addressed. A duplicate message was created. Will close this message as there is another one open.

## 2015-06-01 NOTE — Telephone Encounter (Signed)
Patient notified.  No questions or concerns at this time. Nothing further needed.   

## 2015-06-02 ENCOUNTER — Telehealth: Payer: Self-pay | Admitting: Pulmonary Disease

## 2015-06-02 NOTE — Telephone Encounter (Signed)
Arthur [name] called to report that he is back on Warfarin for AFIB (according to his report his INR was sub therapeutic on Friday). He states that he has coughed up a small amount of blood (size of a dime). He reports that he is followed by Dr. Joya Gaskins and he has done this in the past and has had a negative workup (according to him). I instructed him to hold the Warfarin for the weekend and take ASA 325 mg PO daily instead. I also instructed him to call the Pulmonary Office on Monday morning for further instructions. I also told him that he should come to the ED for coughing up larger volumes of blood or increased SOB.

## 2015-06-03 NOTE — Telephone Encounter (Signed)
i would cont warfarin for now If continues to bleed then needs to be off anticoagulants for life and needs ov with dr cooper to consider atrial appendage occlusion procedure

## 2015-06-03 NOTE — Telephone Encounter (Signed)
Just now seeing this note.  Pt still bleeding, therefore  STOP ASA and Warfarin  Needs OV with Dr Burt Knack to consider atrial appendage closure procedure  i will call pt when i return from my vacation

## 2015-06-04 ENCOUNTER — Ambulatory Visit (HOSPITAL_COMMUNITY)
Admission: RE | Admit: 2015-06-04 | Discharge: 2015-06-04 | Disposition: A | Discharge status: Home / Self Care | Payer: Medicare Other | Source: Ambulatory Visit | Attending: Vascular Surgery | Admitting: Vascular Surgery

## 2015-06-04 ENCOUNTER — Encounter: Payer: Self-pay | Admitting: Vascular Surgery

## 2015-06-04 ENCOUNTER — Ambulatory Visit (INDEPENDENT_AMBULATORY_CARE_PROVIDER_SITE_OTHER): Payer: Medicare Other

## 2015-06-04 ENCOUNTER — Telehealth: Payer: Self-pay

## 2015-06-04 ENCOUNTER — Ambulatory Visit (INDEPENDENT_AMBULATORY_CARE_PROVIDER_SITE_OTHER): Payer: Medicare Other | Admitting: Vascular Surgery

## 2015-06-04 VITALS — BP 137/72 | HR 78 | Resp 16 | Ht 74.0 in | Wt 200.0 lb

## 2015-06-04 DIAGNOSIS — I83028 Varicose veins of left lower extremity with ulcer other part of lower leg: Secondary | ICD-10-CM

## 2015-06-04 DIAGNOSIS — Z48812 Encounter for surgical aftercare following surgery on the circulatory system: Secondary | ICD-10-CM | POA: Diagnosis not present

## 2015-06-04 DIAGNOSIS — I83891 Varicose veins of right lower extremities with other complications: Secondary | ICD-10-CM

## 2015-06-04 DIAGNOSIS — Z5181 Encounter for therapeutic drug level monitoring: Secondary | ICD-10-CM

## 2015-06-04 DIAGNOSIS — I83029 Varicose veins of left lower extremity with ulcer of unspecified site: Secondary | ICD-10-CM

## 2015-06-04 DIAGNOSIS — I83023 Varicose veins of left lower extremity with ulcer of ankle: Secondary | ICD-10-CM

## 2015-06-04 DIAGNOSIS — I83021 Varicose veins of left lower extremity with ulcer of thigh: Secondary | ICD-10-CM | POA: Diagnosis not present

## 2015-06-04 DIAGNOSIS — I83024 Varicose veins of left lower extremity with ulcer of heel and midfoot: Secondary | ICD-10-CM

## 2015-06-04 DIAGNOSIS — I83022 Varicose veins of left lower extremity with ulcer of calf: Secondary | ICD-10-CM

## 2015-06-04 DIAGNOSIS — I4891 Unspecified atrial fibrillation: Secondary | ICD-10-CM | POA: Diagnosis not present

## 2015-06-04 DIAGNOSIS — I83025 Varicose veins of left lower extremity with ulcer other part of foot: Secondary | ICD-10-CM

## 2015-06-04 DIAGNOSIS — L97929 Non-pressure chronic ulcer of unspecified part of left lower leg with unspecified severity: Secondary | ICD-10-CM

## 2015-06-04 LAB — POCT INR: INR: 1.1

## 2015-06-04 MED ORDER — ENOXAPARIN SODIUM 100 MG/ML ~~LOC~~ SOLN: 100.0000 mg | Freq: Two times a day (BID) | SUBCUTANEOUS | Status: DC

## 2015-06-04 NOTE — Telephone Encounter (Signed)
Pt had f/u today with Dr Kellie Simmering s/p laser ablation left great saphenous vein, doppler done and pt has a thrombus extending from L great saphenous vein which is not occluding femoral vein, but is bulging into femoral vein and in order to prevent DVT Dr Kellie Simmering wants to put pt on Lovenox and Coumadin.  Marrianne Mood, RN pt was restarted on Coumadin last week, INR was 1.1 on 7/15.  Despite sub-therapeutic INR pt  called into pulmonary office multiple times with c/o hemoptysis (small amt), per Dr Bettina Gavia note in [place] on 06/03/15 pt was to discontinue Coumadin secondary to bleeding risk and schedule appt with Dr Burt Knack to f/u on consideration of possible closure of atrial appendage.  Called Dr [employer] pager and gave him Dr Evelena Leyden cell number to discuss.

## 2015-06-04 NOTE — Patient Instructions (Addendum)
Pt educated to self inject Lovenox, 45 minutes spent in room with patient and his wife.  Lovenox education sheet given to pt to take home.  First injection 100mg  given today in office as pt was walked step by step through injection process.  Lovenox 100mg /ml lot [id number] Exp 05/2015 L abdomen SQ.  06/04/15 Take 5mg  Coumadin today.  Lovenox 100mg  given in office already today. 06/05/15 Take 5mg  of Coumadin.  Take Lovenox 100mg  in the am, and Lovenox 100mg  in the pm. 06/06/15 Take 5mg  of Coumadin.  Take Lovenox 100mg  in the am, and Lovenox  100mg  in the pm. 06/07/15 Take 5mg  of Coumadin.  Take Lovenox 100mg  in the am, and Lovenox  100mg  in the pm. 06/08/15 Take 5mg  of Coumadin.  Take Lovenox 100mg  in the am.  Recheck INR.

## 2015-06-04 NOTE — Progress Notes (Signed)
Subjective:     Patient ID: Arthur [name], male   DOB: 11-17-1925, 81 y.o.   MRN: [id number]  HPI  81 year old male returns 1 week post-laser ablation  Left great saphenous vein. Patient denies any chest pain or dyspnea on exertion. He does have a history of some hemoptysis dating back 3-4 weeks. He had bronchoscopy which did not reveal any etiology. Patient is on chronic anticoagulation-Coumadin for atrial fibrillation. He is followed by Dr. Sherren Mocha and Dr. Asencion Noble. Patient has had no change in his distal edema of the left leg which is chronic. He resumed his Coumadin the day of the procedure one week ago and took it for 4 days. He then had a very slight amount of hemoptysis which occurred on July 15. At that time his INR was 1.1 area did he was scheduled to discontinue his Coumadin in anticipation of a similar procedure on the contralateral right leg. He has had no further hemoptysis. His Coumadin dose was 5 mg on Monday Wednesday Friday with 2.5 mg on the other days of the week.   Past Medical History  Diagnosis Date  . Coronary artery disease     MI, atherectomy 1993  . Hyperlipidemia   . Arrhythmia     1995  . Atrial fibrillation   . History of transient ischemic attack (TIA) 2005    double vision  . Hypertension   . History of gallstones   . 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 Eczema   . Elevated LFTs   . Skin cancer   . DDD (degenerative disc disease)   . Pneumonia     long time ago  . Varicose veins     History  Substance Use Topics  . Smoking status: Former Smoker -- 7 years    Types: 47, Pipe    Quit date: 11/18/1967  . Smokeless tobacco: Never Used     Comment: 1-2 cigars a day, pipe  . Alcohol Use: 0.0 oz/week    0 Standard drinks or equivalent per week     Comment: 2-3 glasses of wine in the evening    Family History  Problem Relation Age of Onset  . Heart attack Mother 57    MI  . Pneumonia Father     Allergies  Allergen Reactions   . Penicillins Hives     Current outpatient prescriptions:  .  furosemide (LASIX) 20 MG tablet, Take one tablet (20 mg) by mouth once daily as needed for swelling, Disp: 30 tablet, Rfl: 2 .  losartan (COZAAR) 50 MG tablet, TAKE 1 TABLET (50 MG TOTAL) BY MOUTH DAILY., Disp: 90 tablet, Rfl: 2 .  metoprolol tartrate (LOPRESSOR) 25 MG tablet, TAKE 1 TABLET DAILY, Disp: 90 tablet, Rfl: 1 .  predniSONE (DELTASONE) 10 MG tablet, Take 1 tablet (10 mg total) by mouth daily with breakfast., Disp: 100 tablet, Rfl: 3 .  warfarin (COUMADIN) 5 MG tablet, ON HOLD, Disp: , Rfl:   There were no vitals filed for this visit.  There is no weight on file to calculate BMI.          Review of Systems  Denies chest pain, dyspnea on exertion, PND, orthopnea. See history of present illness. Chronic atrial fib.     Objective:   Physical Exam There were no vitals taken for this visit. Gen. Well-developed well-nourished male in no apparent distress alert and oriented 3 Lungs no rhonchi or wheezing Cardiovascular irregular rhythm with no murmur Left leg  with mild discomfort to palpation from inguinal crease to knee over great saphenous vein. Mild bruising. Chronic 1+ edema. 3+ dorsalis pedis pulse palpable.  Today I ordered a venous duplex exam which I reviewed and interpreted and also witnessed this study by the technologist. He does have total closure of the great saphenous vein up to the saphenofemoral junction with some bulging of thrombus into the common femoral vein but no obstruction of the common femoral vein.     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 This was discontinued prior to his procedure. A few episodes of hemoptysis one prior to this procedure and one following the procedure which has been evaluated by Dr. Asencion Noble Feel patient should be  on chronic anticoagulation while Coumadin is being regulated Talk to Coumadin clinic today and also spoke with Dr. Sherren Mocha and plan Lovenox bridge per Coumadin clinic and also resuming Coumadin dose     Plan:     Patient will return in 2 months for venous duplex exam left leg We'll cancel procedure on right leg for time being because of this bulging thrombus into left common femoral vein

## 2015-06-04 NOTE — Telephone Encounter (Signed)
Dr Burt Knack called from [facility] states he spoke with Dr Kellie Simmering and pt does need to resume Coumadin and Lovenox for thrombus L great saphenous vein INR goal 2.0-2.5 per Dr Kellie Simmering duration therapy hopefully only 6-8 wks.  Pt scheduled to come into our office today 06/04/15 at 3:30pm for Lovenox teaching and Coumadin dosing.

## 2015-06-04 NOTE — Telephone Encounter (Signed)
Spoke with pt advised per Dr. Joya Gaskins to continue to hold Coumadin, needs appt soon with Dr. Burt Knack to consider atrial appendage closure procedure per PW. Pt never started taking 325mg  ASA QD as instructed by Dr. Oletta Darter on 06/02/15, pt is aware not to take.  Unable to schedule pt with Dr. Burt Knack, no available appts soon.  Will forward message to Theodosia Quay Dr. York Cerise RN to see if able to work pt in for sooner appt.

## 2015-06-06 ENCOUNTER — Telehealth: Payer: Self-pay | Admitting: Adult Health

## 2015-06-06 NOTE — Telephone Encounter (Signed)
lmomtcb x1 

## 2015-06-06 NOTE — Telephone Encounter (Signed)
Pt has 2 week follow up scheduled for tomorrow with TP. He has had recent varicose vein surgery. He had a clot in his leg and his coumadin was increased and on lovenox injections as well.  He is being followed by coumadin clinic and his surgeon. He wants to know if he can cancel appt with [place] for now. Please advise PW thanks

## 2015-06-06 NOTE — Telephone Encounter (Signed)
i am ok with this but he needs to monitor for bleeding from his lung.

## 2015-06-06 NOTE — Telephone Encounter (Signed)
Pt called back. He reports he will just keep his appt scheduled for tomorrow. Nothing further needed

## 2015-06-07 ENCOUNTER — Encounter: Payer: Self-pay | Admitting: Adult Health

## 2015-06-07 ENCOUNTER — Ambulatory Visit (INDEPENDENT_AMBULATORY_CARE_PROVIDER_SITE_OTHER): Payer: Medicare Other | Admitting: Adult Health

## 2015-06-07 ENCOUNTER — Ambulatory Visit (INDEPENDENT_AMBULATORY_CARE_PROVIDER_SITE_OTHER)
Admission: RE | Admit: 2015-06-07 | Discharge: 2015-06-07 | Disposition: A | Discharge status: Home / Self Care | Payer: Medicare Other | Source: Ambulatory Visit | Attending: Adult Health | Admitting: Adult Health

## 2015-06-07 VITALS — BP 122/72 | HR 68 | Wt 204.0 lb

## 2015-06-07 DIAGNOSIS — J189 Pneumonia, unspecified organism: Secondary | ICD-10-CM

## 2015-06-07 DIAGNOSIS — I83009 Varicose veins of unspecified lower extremity with ulcer of unspecified site: Secondary | ICD-10-CM

## 2015-06-07 DIAGNOSIS — R042 Hemoptysis: Secondary | ICD-10-CM

## 2015-06-07 DIAGNOSIS — I82409 Acute embolism and thrombosis of unspecified deep veins of unspecified lower extremity: Secondary | ICD-10-CM | POA: Insufficient documentation

## 2015-06-07 DIAGNOSIS — L97909 Non-pressure chronic ulcer of unspecified part of unspecified lower leg with unspecified severity: Secondary | ICD-10-CM

## 2015-06-07 NOTE — Telephone Encounter (Signed)
noted 

## 2015-06-07 NOTE — Assessment & Plan Note (Addendum)
Resolved . Felt secondary to airway friability in setting of steroids and coumadin .  He is back on coumaidn now , will need to watch closely and he is to report any bleeding.  Cont to monitor closely  Chest xray today

## 2015-06-07 NOTE — Assessment & Plan Note (Signed)
Cont follow up with vascular as planed

## 2015-06-07 NOTE — Patient Instructions (Addendum)
Chest xray today .  Follow up with coumadin clinic as planned in am .  follow up Dr. Joya Gaskins in 6  Weeks and As needed   Please contact office for sooner follow up if symptoms do not improve or worsen or seek emergency care

## 2015-06-07 NOTE — Assessment & Plan Note (Signed)
DVT in setting after vein ablation and off coumadin  Pt is now on coumadin and lovenox bridge, cont w/ coumadin clinic follow up  follow up with vascular as planned and As needed

## 2015-06-07 NOTE — Progress Notes (Signed)
   Subjective:    Patient ID: Arthur [name], male    DOB: 1925-07-16, 81 y.o.   MRN: [id number]  HPI 81 yo male with autoimmune hepatitis seen for pulmonary consult during hospitalization in June 2016 for acute hemoptysis .  FOB neg for endobronchial lesion . He has Atrial fib on coumadin . Marland KitchenFelt possibly airway friability in setting of INR >3 and chronic steroid use.    06/07/2015 Follow up : Hemoptysis  Pt returns for 2 week follow up , seen last ov with ongoing hemoptysis  His coumadin was held and steroids were decreased to 10mg  .  Pt had an ablation to left greater saphenous vein for active venous stasis ulcer on 7/11 .  On 7/18 venous doppler showed clot in the great saphenous vein to femoral junction with bulging clot into CFV  He was restarted on coumadin with lovenox bridge monitored in coumadin clinic.  Has ov with them in a am.  Says breathing is fine with no cough or dyspnea.  Hemoptysis has resolved.  Denies chest pain  Has ongoing leg swelling , wears TED hose.  Swelling is less in am .    Review of Systems .Constitutional:   No  weight loss, night sweats,  Fevers, chills, fatigue, or  lassitude.  HEENT:   No headaches,  Difficulty swallowing,  Tooth/dental problems, or  Sore throat,                No sneezing, itching, ear ache, nasal congestion, post nasal drip,   CV:  No chest pain,  Orthopnea, PND,  , anasarca, dizziness, palpitations, syncope.   GI  No heartburn, indigestion, abdominal pain, nausea, vomiting, diarrhea, change in bowel habits, loss of appetite, bloody stools.   Resp: No shortness of breath with exertion or at rest.  No excess mucus, no productive cough,  No non-productive cough,  No coughing up of blood.  No change in color of mucus.  No wheezing.  No chest wall deformity  Skin: no rash or lesions.  GU: no dysuria, change in color of urine, no urgency or frequency.  No flank pain, no hematuria   MS:  No joint pain or swelling.  No decreased  range of motion.  No back pain.  Psych:  No change in mood or affect. No depression or anxiety.  No memory loss.         Objective:   Physical Exam GEN: A/Ox3; pleasant , NAD, elderly   HEENT:  [place]/AT,  EACs-clear, TMs-wnl, NOSE-clear, THROAT-clear, no lesions, no postnasal drip or exudate noted.   NECK:  Supple w/ fair ROM; no JVD; normal carotid impulses w/o bruits; no thyromegaly or nodules palpated; no lymphadenopathy.  RESP  Clear  P & A; w/o, wheezes/ rales/ or rhonchi.no accessory muscle use, no dullness to percussion  CARD:  RRR, no m/r/g  , 1-2 + peripheral edema/teds hose bilaterally , pulses intact, no cyanosis or clubbing.  GI:   Soft & nt; nml bowel sounds; no organomegaly or masses detected.  Musco: Warm bil, no deformities or joint swelling noted.   Neuro: alert, no focal deficits noted.    Skin: Warm, no lesions or rashes         Assessment & Plan:

## 2015-06-08 ENCOUNTER — Ambulatory Visit (INDEPENDENT_AMBULATORY_CARE_PROVIDER_SITE_OTHER): Payer: Medicare Other

## 2015-06-08 DIAGNOSIS — I4891 Unspecified atrial fibrillation: Secondary | ICD-10-CM

## 2015-06-08 DIAGNOSIS — Z5181 Encounter for therapeutic drug level monitoring: Secondary | ICD-10-CM

## 2015-06-08 LAB — POCT INR: INR: 1.5

## 2015-06-08 NOTE — Progress Notes (Signed)
Quick Note:  lmtcb for pt. ______ 

## 2015-06-11 ENCOUNTER — Telehealth: Payer: Self-pay | Admitting: Cardiovascular Disease

## 2015-06-11 ENCOUNTER — Other Ambulatory Visit: Payer: Medicare Other | Admitting: Vascular Surgery

## 2015-06-11 ENCOUNTER — Ambulatory Visit (INDEPENDENT_AMBULATORY_CARE_PROVIDER_SITE_OTHER): Payer: Medicare Other | Admitting: *Deleted

## 2015-06-11 DIAGNOSIS — I4891 Unspecified atrial fibrillation: Secondary | ICD-10-CM | POA: Diagnosis not present

## 2015-06-11 DIAGNOSIS — Z5181 Encounter for therapeutic drug level monitoring: Secondary | ICD-10-CM | POA: Diagnosis not present

## 2015-06-11 LAB — POCT INR: INR: 1.4

## 2015-06-11 MED ORDER — ENOXAPARIN SODIUM 100 MG/ML ~~LOC~~ SOLN: 100.0000 mg | Freq: Two times a day (BID) | SUBCUTANEOUS | Status: DC

## 2015-06-11 NOTE — Telephone Encounter (Signed)
New message    Pt calling back because he has not heard back from office regarding his INR. Please call to discuss

## 2015-06-11 NOTE — Telephone Encounter (Signed)
New message    Pt has questions about what changes that coumadin clinic made last week and this morning. Pt states INR went down after making these changes and wants to discuss these changes with Dr. Burt Knack or nurse Pt did not want to speak to coumadin clinic about these issues. Please call to discuss

## 2015-06-11 NOTE — Progress Notes (Signed)
Quick Note:  Called and spoke with pt. Reviewed results and recs. Pt voiced understanding and had no further questions. ______ 

## 2015-06-11 NOTE — Telephone Encounter (Signed)
I spoke with the pt and reviewed his INR readings and instructions from the coumadin clinic.  I made him aware that his diet can also effect his INR results and he was already advised about this information during his coumadin appointment. The pt is scheduled for a repeat INR on 06/14/15.

## 2015-06-13 ENCOUNTER — Ambulatory Visit (INDEPENDENT_AMBULATORY_CARE_PROVIDER_SITE_OTHER): Payer: Medicare Other | Admitting: Pharmacist

## 2015-06-13 ENCOUNTER — Other Ambulatory Visit (INDEPENDENT_AMBULATORY_CARE_PROVIDER_SITE_OTHER): Payer: Medicare Other

## 2015-06-13 DIAGNOSIS — Z5181 Encounter for therapeutic drug level monitoring: Secondary | ICD-10-CM | POA: Diagnosis not present

## 2015-06-13 DIAGNOSIS — I4891 Unspecified atrial fibrillation: Secondary | ICD-10-CM

## 2015-06-13 DIAGNOSIS — R945 Abnormal results of liver function studies: Secondary | ICD-10-CM

## 2015-06-13 DIAGNOSIS — R7989 Other specified abnormal findings of blood chemistry: Secondary | ICD-10-CM

## 2015-06-13 LAB — HEPATIC FUNCTION PANEL
ALK PHOS: 66 U/L (ref 39–117)
ALT: 41 U/L (ref 0–53)
AST: 42 U/L — AB (ref 0–37)
Albumin: 3.5 g/dL (ref 3.5–5.2)
BILIRUBIN DIRECT: 0.2 mg/dL (ref 0.0–0.3)
Total Bilirubin: 0.6 mg/dL (ref 0.2–1.2)
Total Protein: 6.7 g/dL (ref 6.0–8.3)

## 2015-06-13 LAB — POCT INR: INR: 2.1

## 2015-06-14 ENCOUNTER — Other Ambulatory Visit: Payer: Self-pay

## 2015-06-14 DIAGNOSIS — R7989 Other specified abnormal findings of blood chemistry: Secondary | ICD-10-CM

## 2015-06-14 DIAGNOSIS — R945 Abnormal results of liver function studies: Principal | ICD-10-CM

## 2015-06-14 NOTE — Telephone Encounter (Signed)
Late Entry:  I spoke with the pt during his coumadin appointment on 06/13/15.  I made him aware that we are currently not doing the Watch Man device at this time but will be in the near future.  Once we begin this treatment we will contact the pt for further evaluation. Pt verbalized understanding.

## 2015-06-18 ENCOUNTER — Ambulatory Visit (INDEPENDENT_AMBULATORY_CARE_PROVIDER_SITE_OTHER): Payer: Medicare Other | Admitting: *Deleted

## 2015-06-18 ENCOUNTER — Telehealth: Payer: Self-pay

## 2015-06-18 ENCOUNTER — Ambulatory Visit: Payer: Medicare Other | Admitting: Vascular Surgery

## 2015-06-18 ENCOUNTER — Encounter (HOSPITAL_COMMUNITY): Payer: Medicare Other

## 2015-06-18 DIAGNOSIS — Z5181 Encounter for therapeutic drug level monitoring: Secondary | ICD-10-CM

## 2015-06-18 DIAGNOSIS — I4891 Unspecified atrial fibrillation: Secondary | ICD-10-CM | POA: Diagnosis not present

## 2015-06-18 LAB — POCT INR: INR: 1.8

## 2015-06-18 NOTE — Telephone Encounter (Signed)
Phone call from pt.  Reported he has intermittent "weeping of clear fluid" from a small incision, just above left ankle.  Denied any redness in the surrounding tissue.  Denied fever/ chills.  Stated he was placed on a "water pill" and since then, the weeping has decreased a little.  Advised to keep area clean and dry.  Encouraged to apply a 2x2 gauze pad directly over site that is weeping, and then apply a 4x4 gauze on top of the smaller gauze.  Encouraged to apply his compression stockings daily.  Advised that if there is edema in the tissues of the lower extremity, the fluid will weep out through any incisions or break in the skin.  Encouraged to continue to manage the swelling with compression and elevation of lower extremities.  Verb.understanding.  Stated he is going to the beach, and will call when he returns for an appt.

## 2015-06-18 NOTE — Addendum Note (Signed)
Addended by: Margretta Sidle on: 06/18/2015 09:20 AM   Modules accepted: Orders, Medications

## 2015-06-25 ENCOUNTER — Ambulatory Visit (INDEPENDENT_AMBULATORY_CARE_PROVIDER_SITE_OTHER): Payer: Medicare Other | Admitting: *Deleted

## 2015-06-25 DIAGNOSIS — Z5181 Encounter for therapeutic drug level monitoring: Secondary | ICD-10-CM

## 2015-06-25 DIAGNOSIS — I4891 Unspecified atrial fibrillation: Secondary | ICD-10-CM

## 2015-06-25 LAB — POCT INR: INR: 2.8

## 2015-06-26 ENCOUNTER — Other Ambulatory Visit: Payer: Self-pay | Admitting: *Deleted

## 2015-06-26 ENCOUNTER — Telehealth: Payer: Self-pay | Admitting: *Deleted

## 2015-06-26 DIAGNOSIS — Z86718 Personal history of other venous thrombosis and embolism: Secondary | ICD-10-CM

## 2015-06-26 NOTE — Telephone Encounter (Signed)
Pt called to report that his wound has stopped draining. He was wondering what he needs to do. He does not want to wear an [employer]. He is changing dsgs at home. I told him to continue this since the wound is slowly healing with this approach. Made an appt for a vascular study on 9/19 and JDL visit. Follow prn.

## 2015-06-28 ENCOUNTER — Telehealth: Payer: Self-pay | Admitting: *Deleted

## 2015-06-28 NOTE — Telephone Encounter (Signed)
[name] is calling to inquire about care of ulcer on his left leg and dressing changes.  [name] states he was seen at [facility] on 06-26-2015 and Arthur [name] compression was discontinued at his request.  [name] states the left leg ulcer is not draining and is starting to scab but he is very discouraged because the ulcer is not healing.  Explained that venous ulcers are very slow to heal.  [name] states he does not want to return to [employer] compression. Encouraged him to cover the ulcer with gauze or non-stick band-aide and wear his compression hose daily.  Also encouraged him to elevate his left leg when sitting.  Asked that he call [facility] if his symptoms worsen or if he has further questions or concerns.  He is scheduled for venous ultrasound and visit with Dr. Kellie Simmering on 08-06-2015.

## 2015-07-05 ENCOUNTER — Ambulatory Visit (INDEPENDENT_AMBULATORY_CARE_PROVIDER_SITE_OTHER): Payer: Medicare Other | Admitting: *Deleted

## 2015-07-05 DIAGNOSIS — I4891 Unspecified atrial fibrillation: Secondary | ICD-10-CM | POA: Diagnosis not present

## 2015-07-05 DIAGNOSIS — Z5181 Encounter for therapeutic drug level monitoring: Secondary | ICD-10-CM

## 2015-07-05 LAB — POCT INR: INR: 2.5

## 2015-07-07 ENCOUNTER — Other Ambulatory Visit: Payer: Self-pay | Admitting: Cardiovascular Disease

## 2015-07-10 ENCOUNTER — Telehealth: Payer: Self-pay

## 2015-07-10 ENCOUNTER — Other Ambulatory Visit (INDEPENDENT_AMBULATORY_CARE_PROVIDER_SITE_OTHER): Payer: Medicare Other

## 2015-07-10 DIAGNOSIS — R7989 Other specified abnormal findings of blood chemistry: Secondary | ICD-10-CM | POA: Diagnosis not present

## 2015-07-10 DIAGNOSIS — R945 Abnormal results of liver function studies: Principal | ICD-10-CM

## 2015-07-10 LAB — HEPATIC FUNCTION PANEL
ALT: 71 U/L — ABNORMAL HIGH (ref 0–53)
AST: 56 U/L — AB (ref 0–37)
Albumin: 3.5 g/dL (ref 3.5–5.2)
Alkaline Phosphatase: 67 U/L (ref 39–117)
BILIRUBIN DIRECT: 0.2 mg/dL (ref 0.0–0.3)
Total Bilirubin: 0.8 mg/dL (ref 0.2–1.2)
Total Protein: 6.5 g/dL (ref 6.0–8.3)

## 2015-07-10 NOTE — Telephone Encounter (Signed)
-----   Message from Algernon Huxley, RN sent at 06/14/2015 10:20 AM EDT ----- Regarding: LFT's Pt needs lfts order in epic.

## 2015-07-10 NOTE — Telephone Encounter (Signed)
Pts wife aware and will let pt know.

## 2015-07-12 ENCOUNTER — Other Ambulatory Visit: Payer: Self-pay

## 2015-07-12 DIAGNOSIS — R7989 Other specified abnormal findings of blood chemistry: Secondary | ICD-10-CM

## 2015-07-12 DIAGNOSIS — R945 Abnormal results of liver function studies: Principal | ICD-10-CM

## 2015-07-16 ENCOUNTER — Encounter: Payer: Self-pay | Admitting: Internal Medicine

## 2015-07-19 ENCOUNTER — Ambulatory Visit (INDEPENDENT_AMBULATORY_CARE_PROVIDER_SITE_OTHER): Payer: Medicare Other

## 2015-07-19 DIAGNOSIS — Z5181 Encounter for therapeutic drug level monitoring: Secondary | ICD-10-CM | POA: Diagnosis not present

## 2015-07-19 DIAGNOSIS — I4891 Unspecified atrial fibrillation: Secondary | ICD-10-CM

## 2015-07-19 LAB — POCT INR: INR: 2.2

## 2015-07-29 ENCOUNTER — Other Ambulatory Visit: Payer: Self-pay | Admitting: Cardiovascular Disease

## 2015-08-03 ENCOUNTER — Encounter: Payer: Self-pay | Admitting: Vascular Surgery

## 2015-08-06 ENCOUNTER — Encounter: Payer: Self-pay | Admitting: Vascular Surgery

## 2015-08-06 ENCOUNTER — Ambulatory Visit (INDEPENDENT_AMBULATORY_CARE_PROVIDER_SITE_OTHER): Payer: Medicare Other | Admitting: Vascular Surgery

## 2015-08-06 ENCOUNTER — Other Ambulatory Visit (INDEPENDENT_AMBULATORY_CARE_PROVIDER_SITE_OTHER): Payer: Medicare Other

## 2015-08-06 ENCOUNTER — Telehealth: Payer: Self-pay

## 2015-08-06 ENCOUNTER — Ambulatory Visit (HOSPITAL_COMMUNITY)
Admission: RE | Admit: 2015-08-06 | Discharge: 2015-08-06 | Disposition: A | Discharge status: Home / Self Care | Payer: Medicare Other | Source: Ambulatory Visit | Attending: Vascular Surgery | Admitting: Vascular Surgery

## 2015-08-06 VITALS — BP 149/76 | HR 43 | Temp 96.9°F | Resp 18 | Ht 74.0 in | Wt 198.0 lb

## 2015-08-06 DIAGNOSIS — R7989 Other specified abnormal findings of blood chemistry: Secondary | ICD-10-CM | POA: Diagnosis not present

## 2015-08-06 DIAGNOSIS — I83893 Varicose veins of bilateral lower extremities with other complications: Secondary | ICD-10-CM

## 2015-08-06 DIAGNOSIS — R945 Abnormal results of liver function studies: Principal | ICD-10-CM

## 2015-08-06 DIAGNOSIS — M7989 Other specified soft tissue disorders: Secondary | ICD-10-CM | POA: Diagnosis not present

## 2015-08-06 DIAGNOSIS — M79606 Pain in leg, unspecified: Secondary | ICD-10-CM | POA: Diagnosis not present

## 2015-08-06 DIAGNOSIS — I83899 Varicose veins of unspecified lower extremities with other complications: Secondary | ICD-10-CM | POA: Insufficient documentation

## 2015-08-06 DIAGNOSIS — Z86718 Personal history of other venous thrombosis and embolism: Secondary | ICD-10-CM | POA: Diagnosis not present

## 2015-08-06 LAB — HEPATIC FUNCTION PANEL
ALBUMIN: 3.5 g/dL (ref 3.5–5.2)
ALK PHOS: 79 U/L (ref 39–117)
ALT: 133 U/L — AB (ref 0–53)
AST: 97 U/L — ABNORMAL HIGH (ref 0–37)
BILIRUBIN DIRECT: 0.2 mg/dL (ref 0.0–0.3)
TOTAL PROTEIN: 6.8 g/dL (ref 6.0–8.3)
Total Bilirubin: 0.8 mg/dL (ref 0.2–1.2)

## 2015-08-06 NOTE — Progress Notes (Signed)
Filed Vitals:   08/06/15 1026 08/06/15 1027  BP: 150/82 149/76  Pulse: 46 43  Temp: 96.9 F (36.1 C)   Resp: 18   Height: 6\' 2"  (1.88 m)   Weight: 198 lb (89.812 kg)   SpO2: 96%

## 2015-08-06 NOTE — Telephone Encounter (Signed)
-----   Message from Algernon Huxley, RN sent at 07/12/2015  8:33 AM EDT ----- Regarding: LFT's Pt needs LFT's in 1 month, order in epic.

## 2015-08-06 NOTE — Telephone Encounter (Signed)
Pt aware.

## 2015-08-06 NOTE — Progress Notes (Signed)
Subjective:     Patient ID: Arthur [name], male   DOB: 1925-11-05, 81 y.o.   MRN: [id number]  HPI this 81 year old male returns today for continued follow-up regarding his laser ablation of the left great saphenous vein which was performed 2 months ago. He had an stasis ulcer left leg which has healed and he states that the swelling in the left leg has significantly diminished. He developed some thrombus which was bulging into the femoral vein at the saphenofemoral junction but was not obstructing the vein and therefore we postponed his laser ablation of the right leg for 2 months. He's returns today for follow-up venous duplex exam of the left leg. He is on chronic Coumadin therapy for atrial fibrillation.  Past Medical History  Diagnosis Date  . Coronary artery disease     MI, atherectomy 1993  . Hyperlipidemia   . Arrhythmia     1995  . Atrial fibrillation   . History of transient ischemic attack (TIA) 2005    double vision  . Hypertension   . History of gallstones   . 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 Eczema   . Elevated LFTs   . Skin cancer   . DDD (degenerative disc disease)   . Pneumonia     long time ago  . Varicose veins     Social History  Substance Use Topics  . Smoking status: Former Smoker -- 7 years    Types: 66, Pipe    Quit date: 11/18/1967  . Smokeless tobacco: Never Used     Comment: 1-2 cigars a day, pipe  . Alcohol Use: 0.0 oz/week    0 Standard drinks or equivalent per week     Comment: 2-3 glasses of wine in the evening    Family History  Problem Relation Age of Onset  . Heart attack Mother 66    MI  . Pneumonia Father     Allergies  Allergen Reactions  . Penicillins Hives     Current outpatient prescriptions:  .  furosemide (LASIX) 20 MG tablet, Take one tablet (20 mg) by mouth once daily as needed for swelling, Disp: 30 tablet, Rfl: 2 .  losartan (COZAAR) 50 MG tablet, TAKE 1 TABLET (50 MG TOTAL) BY MOUTH DAILY., Disp: 90  tablet, Rfl: 0 .  metoprolol tartrate (LOPRESSOR) 25 MG tablet, TAKE 1 TABLET DAILY, Disp: 90 tablet, Rfl: 1 .  predniSONE (DELTASONE) 10 MG tablet, Take 1 tablet (10 mg total) by mouth daily with breakfast., Disp: 100 tablet, Rfl: 3 .  warfarin (COUMADIN) 5 MG tablet, ON HOLD, Disp: , Rfl:   Filed Vitals:   08/06/15 1026 08/06/15 1027  BP: 150/82 149/76  Pulse: 46 43  Temp: 96.9 F (36.1 C)   Resp: 18   Height: 6\' 2"  (1.88 m)   Weight: 198 lb (89.812 kg)   SpO2: 96%     Body mass index is 25.41 kg/(m^2).           Review of Systems denies chest pain, dyspnea on exertion, PND, orthopnea. Does have a remote history of intermittent hemoptysis usually due to Coumadin not being regulated quite precisely enough. Denies claudication.    Objective:   Physical Exam BP 149/76 mmHg  Pulse 43  Temp(Src) 96.9 F (36.1 C)  Resp 18  Ht 6\' 2"  (1.88 m)  Wt 198 lb (89.812 kg)  BMI 25.41 kg/m2  SpO2 96%  Gen. well-developed well-nourished elderly male no apparent distress alert and oriented  3 Lungs no rhonchi or wheezing Cardiovascular regular rhythm no murmurs Left leg with healed stasis ulcer laterally. 1+ edema distally. 2+ dorsalis pedis pulse palpable. Right leg with chronic edema at 1-2+ but no active ulceration noted.  Today I ordered a venous duplex exam of the left leg which I reviewed and interpreted. There is no DVT. The bulging thrombus at the saphenofemoral junction has totally resolved and the saphenous vein is closed from previous ablation     Assessment:     Successful laser ablation left great saphenous vein with healing of stasis ulcer and decrease in edema left leg History of stasis ulcer right leg currently healed and chronic edema right leg    Plan:     Patient would like to hold off on performing laser ablation of right leg at the present time since he has no active ulceration. He realizes that he does have chronic edema and is at risk of developing  further ulceration of that occurs she will be in touch with [place] since he is a candidate for laser ablation right great saphenous vein for a similar reason. Return on when necessary basis

## 2015-08-06 NOTE — Telephone Encounter (Signed)
Left message for pt to call back  °

## 2015-08-07 ENCOUNTER — Other Ambulatory Visit: Payer: Self-pay

## 2015-08-07 DIAGNOSIS — R945 Abnormal results of liver function studies: Principal | ICD-10-CM

## 2015-08-07 DIAGNOSIS — R7989 Other specified abnormal findings of blood chemistry: Secondary | ICD-10-CM

## 2015-08-16 ENCOUNTER — Ambulatory Visit (INDEPENDENT_AMBULATORY_CARE_PROVIDER_SITE_OTHER): Payer: Medicare Other | Admitting: Pharmacist

## 2015-08-16 DIAGNOSIS — I4891 Unspecified atrial fibrillation: Secondary | ICD-10-CM

## 2015-08-16 DIAGNOSIS — Z5181 Encounter for therapeutic drug level monitoring: Secondary | ICD-10-CM | POA: Diagnosis not present

## 2015-08-16 LAB — POCT INR: INR: 1.9

## 2015-08-23 ENCOUNTER — Other Ambulatory Visit: Payer: Self-pay | Admitting: Cardiovascular Disease

## 2015-09-03 ENCOUNTER — Other Ambulatory Visit (INDEPENDENT_AMBULATORY_CARE_PROVIDER_SITE_OTHER): Payer: Medicare Other

## 2015-09-03 ENCOUNTER — Telehealth: Payer: Self-pay

## 2015-09-03 DIAGNOSIS — R7989 Other specified abnormal findings of blood chemistry: Secondary | ICD-10-CM | POA: Diagnosis not present

## 2015-09-03 DIAGNOSIS — R945 Abnormal results of liver function studies: Principal | ICD-10-CM

## 2015-09-03 LAB — HEPATIC FUNCTION PANEL
ALBUMIN: 3.3 g/dL — AB (ref 3.5–5.2)
ALT: 125 U/L — AB (ref 0–53)
AST: 85 U/L — AB (ref 0–37)
Alkaline Phosphatase: 77 U/L (ref 39–117)
Bilirubin, Direct: 0.2 mg/dL (ref 0.0–0.3)
TOTAL PROTEIN: 6.7 g/dL (ref 6.0–8.3)
Total Bilirubin: 0.8 mg/dL (ref 0.2–1.2)

## 2015-09-03 NOTE — Telephone Encounter (Signed)
Pt aware and plans to come this afternoon.

## 2015-09-03 NOTE — Telephone Encounter (Signed)
-----   Message from Algernon Huxley, RN sent at 08/07/2015 11:16 AM EDT ----- Regarding: LFT's Pt needs labs, order in epic.

## 2015-09-04 ENCOUNTER — Other Ambulatory Visit: Payer: Self-pay

## 2015-09-04 DIAGNOSIS — K754 Autoimmune hepatitis: Secondary | ICD-10-CM

## 2015-09-13 ENCOUNTER — Ambulatory Visit (INDEPENDENT_AMBULATORY_CARE_PROVIDER_SITE_OTHER): Payer: Medicare Other | Admitting: Pharmacist

## 2015-09-13 DIAGNOSIS — Z5181 Encounter for therapeutic drug level monitoring: Secondary | ICD-10-CM

## 2015-09-13 DIAGNOSIS — I4891 Unspecified atrial fibrillation: Secondary | ICD-10-CM

## 2015-09-13 LAB — POCT INR: INR: 4.2

## 2015-09-27 ENCOUNTER — Ambulatory Visit (INDEPENDENT_AMBULATORY_CARE_PROVIDER_SITE_OTHER): Payer: Medicare Other | Admitting: *Deleted

## 2015-09-27 DIAGNOSIS — I4891 Unspecified atrial fibrillation: Secondary | ICD-10-CM

## 2015-09-27 DIAGNOSIS — Z5181 Encounter for therapeutic drug level monitoring: Secondary | ICD-10-CM

## 2015-09-27 LAB — POCT INR: INR: 2.5

## 2015-10-02 ENCOUNTER — Encounter: Payer: Self-pay | Admitting: Internal Medicine

## 2015-10-02 NOTE — Telephone Encounter (Signed)
Error

## 2015-10-03 ENCOUNTER — Encounter: Payer: Self-pay | Admitting: Internal Medicine

## 2015-10-03 ENCOUNTER — Ambulatory Visit (INDEPENDENT_AMBULATORY_CARE_PROVIDER_SITE_OTHER): Payer: Medicare Other | Admitting: Internal Medicine

## 2015-10-03 ENCOUNTER — Ambulatory Visit (INDEPENDENT_AMBULATORY_CARE_PROVIDER_SITE_OTHER)
Admission: RE | Admit: 2015-10-03 | Discharge: 2015-10-03 | Disposition: A | Discharge status: Home / Self Care | Payer: Medicare Other | Source: Ambulatory Visit | Attending: Internal Medicine | Admitting: Internal Medicine

## 2015-10-03 ENCOUNTER — Ambulatory Visit: Payer: Medicare Other | Admitting: Family

## 2015-10-03 VITALS — BP 140/86 | HR 69 | Temp 97.5°F | Ht 74.0 in | Wt 202.0 lb

## 2015-10-03 DIAGNOSIS — R0600 Dyspnea, unspecified: Secondary | ICD-10-CM | POA: Diagnosis not present

## 2015-10-03 DIAGNOSIS — M79641 Pain in right hand: Secondary | ICD-10-CM | POA: Diagnosis not present

## 2015-10-03 DIAGNOSIS — M545 Low back pain, unspecified: Secondary | ICD-10-CM | POA: Insufficient documentation

## 2015-10-03 DIAGNOSIS — R233 Spontaneous ecchymoses: Secondary | ICD-10-CM

## 2015-10-03 DIAGNOSIS — M25521 Pain in right elbow: Secondary | ICD-10-CM

## 2015-10-03 DIAGNOSIS — R238 Other skin changes: Secondary | ICD-10-CM

## 2015-10-03 MED ORDER — TIZANIDINE HCL 4 MG PO TABS
4.0000 mg | ORAL_TABLET | Freq: Four times a day (QID) | ORAL | Status: DC | PRN
Start: 2015-10-03 — End: 2015-10-15

## 2015-10-03 MED ORDER — HYDROCODONE-ACETAMINOPHEN 5-325 MG PO TABS: 1.0000 | ORAL_TABLET | Freq: Four times a day (QID) | ORAL | Status: DC | PRN

## 2015-10-03 NOTE — Progress Notes (Signed)
Subjective:    Patient ID: Arthur [name], male    DOB: Jun 15, 1925, 81 y.o.   MRN: [id number]  HPI  Here after a fall Sunday nov 13 off second step of the front stoop; Pt continues to have recurring LBP without change in severity, bowel or bladder change, fever, wt loss,  worsening LE pain/numbness/weakness, gait change or falls.  Also swelling and bruising persistent to right hand, elbow with pain as well.  Pt denies chest pain, wheezing, orthopnea, PND, increased LE swelling, palpitations, dizziness or syncope, but has had some sob post fall, pain makes it worse..  Pt denies new neurological symptoms such as new headache, or facial or extremity weakness or numbness   Pt denies polydipsia, polyuria  On chronic anticoag - goal INR 2-2.5 Past Medical History  Diagnosis Date  . Coronary artery disease     MI, atherectomy 1993  . Hyperlipidemia   . Arrhythmia     19 95  . Atrial fibrillation ([place])   . History of transient ischemic attack (TIA) 2005    double vision  . Hypertension   . History of gallstones   . Myocardial infarct ([place]) 12/98    inferior  . Eczema   . Elevated LFTs   . Skin cancer   . DDD (degenerative disc disease)   . Pneumonia     long time ago  . Varicose veins    Past Surgical History  Procedure Laterality Date  . Cardiac catheterization  04/08/99    LAD 70% INTER 40-50% RCA 30-40%  . Appendectomy    . Coronary angioplasty  04/08/99    LAD  . Coronary angioplasty with stent placement  04/08/99    LAD  . Coronary angioplasty  10/17/97    RCA   . Melanoma excision  2013    lt arm  . Tonsillectomy    . Amputation Right 05/10/2013    Procedure: RIGHT SECOND TOE AMPUTATION;  Surgeon: Hessie Dibble, MD;  Location: [place];  Service: Orthopedics;  Laterality: Right;  . Melanoma excision Left 08/23/2013    Procedure: MELANOMA EXCISION;  Surgeon: Pedro Earls, MD;  Location: [facility];  Service: General;  Laterality: Left;  . Video  bronchoscopy Bilateral 05/07/2015    Procedure: VIDEO BRONCHOSCOPY WITHOUT FLUORO;  Surgeon: Rigoberto Noel, MD;  Location: [facility];  Service: Cardiopulmonary;  Laterality: Bilateral;    reports that he quit smoking about 47 years ago. His smoking use included Cigars and Pipe. He has never used smokeless tobacco. He reports that he drinks alcohol. He reports that he does not use illicit drugs. family history includes Heart attack (age of onset: 55) in his mother; Pneumonia in his father. Allergies  Allergen Reactions  . Penicillins Hives   Current Outpatient Prescriptions on File Prior to Visit  Medication Sig Dispense Refill  . losartan (COZAAR) 50 MG tablet TAKE 1 TABLET (50 MG TOTAL) BY MOUTH DAILY. 90 tablet 0  . metoprolol tartrate (LOPRESSOR) 25 MG tablet TAKE 1 TABLET DAILY 90 tablet 1  . predniSONE (DELTASONE) 10 MG tablet Take 1 tablet (10 mg total) by mouth daily with breakfast. 100 tablet 3  . warfarin (COUMADIN) 5 MG tablet TAKE AS DIRECTED BY COUMADIN CLINIC. 30 tablet 2  . furosemide (LASIX) 20 MG tablet Take one tablet (20 mg) by mouth once daily as needed for swelling (Patient not taking: Reported on 10/03/2015) 30 tablet 2   No current facility-administered medications on file prior to visit.  Review of Systems  Constitutional: Negative for unusual diaphoresis or night sweats HENT: Negative for ringing in ear or discharge Eyes: Negative for double vision or worsening visual disturbance.  Respiratory: Negative for choking and stridor.   Gastrointestinal: Negative for vomiting or other signifcant bowel change Genitourinary: Negative for hematuria or change in urine volume.  Musculoskeletal: Negative for other MSK pain or swelling Skin: Negative for color change and worsening wound.  Neurological: Negative for tremors and numbness other than noted  Psychiatric/Behavioral: Negative for decreased concentration or agitation other than above       Objective:   Physical  Exam BP 140/86 mmHg  Pulse 69  Temp(Src) 97.5 F (36.4 C) (Oral)  Ht 6\' 2"  (1.88 m)  Wt 202 lb (91.627 kg)  BMI 25.92 kg/m2  SpO2 95% VS noted,  Constitutional: Pt appears in no significant distress HENT: Head: NCAT.  Right Ear: External ear normal.  Left Ear: External ear normal.  Eyes: . Pupils are equal, round, and reactive to light. Conjunctivae and EOM are normal Neck: Normal range of motion. Neck supple.  Cardiovascular: Normal rate and regular rhythm.   Pulmonary/Chest: Effort normal and breath sounds without rales or wheezing.  Abd:  Soft, NT, ND, + BS Neurological: Pt is alert. Not confused , motor grossly intact Skin: Skin is warm. No rash, no LE edema Psychiatric: Pt behavior is normal. No agitation.  Spine nontender, no swelling Right hand with diffuse bruising and swelling worst to first and second mcp;s Right elbow with 1+ swelling, bruising but FROM    Assessment & Plan:

## 2015-10-03 NOTE — Progress Notes (Signed)
Pre visit review using our clinic review tool, if applicable. No additional management support is needed unless otherwise documented below in the visit note. 

## 2015-10-03 NOTE — Patient Instructions (Signed)
Please take all new medication as prescribed - the hydrocodone for pain, and the muscle relaxer as needed  The heating pad would be fine (just dont fall asleep on it)  Please continue all other medications as before, and refills have been done if requested.  Please have the pharmacy call with any other refills you may need.  Please continue your efforts at being more active, low cholesterol diet, and weight control.  Please keep your appointments with your specialists as you may have planned  Please go to the XRAY Department in the Basement (go straight as you get off the elevator) for the x-ray testing  Please go to the LAB in the Basement (turn left off the elevator) for the tests to be done today  You will be contacted by phone if any changes need to be made immediately.  Otherwise, you will receive a letter about your results with an explanation, but please check with MyChart first.  Please remember to sign up for MyChart if you have not done so, as this will be important to you in the future with finding out test results, communicating by private email, and scheduling acute appointments online when needed.

## 2015-10-03 NOTE — Assessment & Plan Note (Addendum)
Etiology unclear, for films, r/o compression fx, neuro no changes, for pain control   Note:  Total time for pt hx, exam, review of record with pt in the room, determination of diagnoses and plan for further eval and tx is > 40 min, with over 50% spent in coordination and counseling of patient

## 2015-10-04 ENCOUNTER — Telehealth: Payer: Self-pay | Admitting: Internal Medicine

## 2015-10-04 NOTE — Telephone Encounter (Signed)
All xrays neg for fracture, on acute abnormalities

## 2015-10-04 NOTE — Telephone Encounter (Signed)
Inform pt on the massage below, pt still wants to speak to the assistant. Please call them back

## 2015-10-04 NOTE — Telephone Encounter (Signed)
Pt's spouse called to get the x-ray result. Please give her a call back

## 2015-10-05 NOTE — Telephone Encounter (Signed)
Pt advised in detail, and expressed understanding

## 2015-10-06 NOTE — Assessment & Plan Note (Signed)
S/p fall - for INR , cbc today,  to f/u any worsening symptoms or concerns

## 2015-10-06 NOTE — Assessment & Plan Note (Signed)
Exam benign, for cxr, cont same tx,  to f/u any worsening symptoms or concerns

## 2015-10-06 NOTE — Assessment & Plan Note (Signed)
Hit primarily right hand with fall, for film r/o fx, cont pain control,  to f/u any worsening symptoms or concerns

## 2015-10-06 NOTE — Assessment & Plan Note (Signed)
With trauma, for film r/o fx, for pain control, consider PT,  to f/u any worsening symptoms or concerns

## 2015-10-15 ENCOUNTER — Ambulatory Visit (INDEPENDENT_AMBULATORY_CARE_PROVIDER_SITE_OTHER): Payer: Medicare Other | Admitting: Cardiovascular Disease

## 2015-10-15 ENCOUNTER — Ambulatory Visit (INDEPENDENT_AMBULATORY_CARE_PROVIDER_SITE_OTHER): Payer: Medicare Other | Admitting: Pharmacist

## 2015-10-15 ENCOUNTER — Encounter: Payer: Self-pay | Admitting: Cardiovascular Disease

## 2015-10-15 VITALS — BP 118/88 | HR 63 | Ht 73.0 in | Wt 201.0 lb

## 2015-10-15 DIAGNOSIS — I4891 Unspecified atrial fibrillation: Secondary | ICD-10-CM | POA: Diagnosis not present

## 2015-10-15 DIAGNOSIS — I10 Essential (primary) hypertension: Secondary | ICD-10-CM | POA: Diagnosis not present

## 2015-10-15 DIAGNOSIS — Z5181 Encounter for therapeutic drug level monitoring: Secondary | ICD-10-CM

## 2015-10-15 LAB — POCT INR: INR: 4.1

## 2015-10-15 NOTE — Patient Instructions (Signed)

## 2015-10-15 NOTE — Progress Notes (Signed)
Cardiology Office Note Date:  10/15/2015   ID:  CHEYTON ENNEN, DOB 07/27/1925, MRN [id number]  PCP:  [name], MD  Cardiologist:  Sherren Mocha, MD    Chief Complaint  Patient presents with  . Atrial Fibrillation    History of Present Illness: Arthur [name] is a 81 y.o. male who presents for follow-up evaluation.  He has been followed for chronic atrial fibrillation, coronary artery disease with history of anteroseptal and inferior wall MIs in the 1990s , and previous stenting of the LAD in 2000.   The patient has been maintained on long-term warfarin. Earlier this year, he had problems with recurrent hemoptysis. Symptoms have resolved and he is now tolerating warfarin at a lower goal INR of 2.0-2.5. He's had no chest pain or shortness of breath. He recently sustained a fall and complains of back pain and arm pain. He has been evaluated and underwent back x-ray showing no fracture.   The patient has chronic leg swelling. Overall this is improved with leg elevation and compression. He has been followed by vascular surgery. He underwent laser ablation on the left saphenous vein and developed a post-procedure DVT. Conservative therapy is planned on the right.    Past Medical History  Diagnosis Date  . Coronary artery disease     MI, atherectomy 1993  . Hyperlipidemia   . Arrhythmia     1995  . Atrial fibrillation ([place])   . History of transient ischemic attack (TIA) 2005    double vision  . Hypertension   . History of gallstones   . Myocardial infarct ([place]) 12/98    inferior  . Eczema   . Elevated LFTs   . Skin cancer   . DDD (degenerative disc disease)   . Pneumonia     long time ago  . Varicose veins     Past Surgical History  Procedure Laterality Date  . Cardiac catheterization  04/08/99    LAD 70% INTER 40-50% RCA 30-40%  . Appendectomy    . Coronary angioplasty  04/08/99    LAD  . Coronary angioplasty with stent placement  04/08/99    LAD  . Coronary  angioplasty  10/17/97    RCA   . Melanoma excision  2013    lt arm  . Tonsillectomy    . Amputation Right 05/10/2013    Procedure: RIGHT SECOND TOE AMPUTATION;  Surgeon: Hessie Dibble, MD;  Location: [facility];  Service: Orthopedics;  Laterality: Right;  . Melanoma excision Left 08/23/2013    Procedure: MELANOMA EXCISION;  Surgeon: Pedro Earls, MD;  Location: [facility];  Service: General;  Laterality: Left;  . Video bronchoscopy Bilateral 05/07/2015    Procedure: VIDEO BRONCHOSCOPY WITHOUT FLUORO;  Surgeon: Rigoberto Noel, MD;  Location: [facility];  Service: Cardiopulmonary;  Laterality: Bilateral;    Current Outpatient Prescriptions  Medication Sig Dispense Refill  . losartan (COZAAR) 50 MG tablet TAKE 1 TABLET (50 MG TOTAL) BY MOUTH DAILY. 90 tablet 0  . metoprolol tartrate (LOPRESSOR) 25 MG tablet Take 25 mg by mouth daily.  1  . predniSONE (DELTASONE) 10 MG tablet Take 1 tablet (10 mg total) by mouth daily with breakfast. 100 tablet 3  . warfarin (COUMADIN) 5 MG tablet TAKE AS DIRECTED BY COUMADIN CLINIC. 30 tablet 2   No current facility-administered medications for this visit.    Allergies:   Penicillins   Social History:  The patient  reports that he quit smoking about  47 years ago. His smoking use included Cigars and Pipe. He has never used smokeless tobacco. He reports that he drinks alcohol. He reports that he does not use illicit drugs.   Family History:  The patient's  family history includes Heart attack (age of onset: 28) in his mother; Pneumonia in his father.   ROS:  Please see the history of present illness.  Otherwise, review of systems is positive for leg swelling, back pain, easy bruising.  All other systems are reviewed and negative.   PHYSICAL EXAM: VS:  BP 118/88 mmHg  Pulse 63  Ht 6\' 1"  (1.854 m)  Wt 201 lb (91.173 kg)  BMI 26.52 kg/m2 , BMI Body mass index is 26.52 kg/(m^2). GEN: Well nourished, well developed, pleasant elderly male in  no acute distress HEENT: normal Neck: no JVD, no masses. No carotid bruits Cardiac: irregularly irregular with 2/6 SEM at the RUSB         Respiratory:  clear to auscultation bilaterally, normal work of breathing GI: soft, nontender, nondistended, + BS MS: no deformity or atrophy Ext: 1+ pretibial edema bilaterally Skin: warm and dry, no rash Neuro:  Strength and sensation are intact Psych: euthymic mood, full affect  EKG:  EKG is ordered today. The ekg ordered today shows atrial fibrillation 63 bpm,  LVH with repolarization abnormality  Recent Labs: 05/06/2015: BUN 24*; Creatinine, Ser 1.15; Hemoglobin 12.7*; Magnesium 2.0; Platelets 151; Potassium 4.4; Sodium 137 09/03/2015: ALT 125*   Lipid Panel     Component Value Date/Time   CHOL 133 12/09/2012 0837   TRIG 62.0 12/09/2012 0837   HDL 39.30 12/09/2012 0837   CHOLHDL 3 12/09/2012 0837   VLDL 12.4 12/09/2012 0837   LDLCALC 81 12/09/2012 0837      Wt Readings from Last 3 Encounters:  10/15/15 201 lb (91.173 kg)  10/03/15 202 lb (91.627 kg)  08/06/15 198 lb (89.812 kg)     Cardiac Studies Reviewed: 2D Echo: Study Conclusions  - Left ventricle: The cavity size was normal. Wall thickness was increased in a pattern of moderate LVH. Systolic function was vigorous. The estimated ejection fraction was in the range of 65% to 70%. Wall motion was normal; there were no regional wall motion abnormalities. The study is not technically sufficient to allow evaluation of LV diastolic function. - Aortic valve: Moderately calcified leaflets. Mild to moderate aortic stenosis. There was mild regurgitation. Mean gradient (S): 10 mm Hg. Peak gradient (S): 27 mm Hg. - Aorta: Aortic root dimension: 42 mm (ED). Ascending aortic diameter: 41 mm (S). - Ascending aorta: The ascending aorta was mildly dilated. - Mitral valve: Calcified annulus. - Left atrium: Massively enlarged at 105 ml/m2. - Right atrium: Massively  dilated at 43 cm2. - Tricuspid valve: There was mild regurgitation. - Pulmonary arteries: PA peak pressure: 30 mm Hg (S). - Inferior vena cava: The vessel was normal in size. The respirophasic diameter changes were in the normal range (>= 50%), consistent with normal central venous pressure.  Impressions:  - LVEF 65-70%, moderate LVH, calcified aortic valve with mild to moderate aortic stenosis, mild AI, dilated aorta to 4.2 cm, massive biatrial enlargement.  ASSESSMENT AND PLAN: 1.   CAD, native vessel, without angina: The patient's medications are reviewed and will be continued without. He is not on antiplatelet therapy in the setting of chronic oral anticoagulation.  2. Essential hypertension: continue losartan and metoprolol. Blood pressure is controlled.  3. Chronic atrial fibrillation: The patient is tolerating warfarin with a goal  INR of 2.0-2.5. He has had no further hemoptysis.  Current medicines are reviewed with the patient today.  The patient does not have concerns regarding medicines.  Labs/ tests ordered today include:   Orders Placed This Encounter  Procedures  . EKG 12-Lead   Disposition:   FU 6 months  Signed, Sherren Mocha, MD  10/15/2015 3:47 PM    [facility] [address], [place], [place]  [address] Phone: [phone]; Fax: [phone]

## 2015-10-25 ENCOUNTER — Other Ambulatory Visit (HOSPITAL_BASED_OUTPATIENT_CLINIC_OR_DEPARTMENT_OTHER): Payer: Medicare Other

## 2015-10-25 ENCOUNTER — Ambulatory Visit (HOSPITAL_BASED_OUTPATIENT_CLINIC_OR_DEPARTMENT_OTHER): Payer: Medicare Other | Admitting: Oncology

## 2015-10-25 ENCOUNTER — Telehealth: Payer: Self-pay | Admitting: Hematology and Oncology

## 2015-10-25 VITALS — BP 131/66 | HR 60 | Temp 97.6°F | Resp 18 | Ht 73.0 in | Wt 200.5 lb

## 2015-10-25 DIAGNOSIS — Z85820 Personal history of malignant melanoma of skin: Secondary | ICD-10-CM | POA: Diagnosis not present

## 2015-10-25 DIAGNOSIS — C439 Malignant melanoma of skin, unspecified: Secondary | ICD-10-CM

## 2015-10-25 LAB — COMPREHENSIVE METABOLIC PANEL
ALT: 60 U/L — ABNORMAL HIGH (ref 0–55)
AST: 54 U/L — AB (ref 5–34)
Albumin: 2.9 g/dL — ABNORMAL LOW (ref 3.5–5.0)
Alkaline Phosphatase: 117 U/L (ref 40–150)
Anion Gap: 11 mEq/L (ref 3–11)
BILIRUBIN TOTAL: 0.93 mg/dL (ref 0.20–1.20)
BUN: 23.2 mg/dL (ref 7.0–26.0)
CO2: 22 meq/L (ref 22–29)
Calcium: 8.9 mg/dL (ref 8.4–10.4)
Chloride: 107 mEq/L (ref 98–109)
Creatinine: 1.4 mg/dL — ABNORMAL HIGH (ref 0.7–1.3)
EGFR: 45 mL/min/{1.73_m2} — AB (ref >90)
GLUCOSE: 128 mg/dL (ref 70–140)
Potassium: 5.4 mEq/L — ABNORMAL HIGH (ref 3.5–5.1)
SODIUM: 140 meq/L (ref 136–145)
TOTAL PROTEIN: 6.7 g/dL (ref 6.4–8.3)

## 2015-10-25 LAB — CBC WITH DIFFERENTIAL/PLATELET
BASO%: 0.9 % (ref 0.0–2.0)
BASOS ABS: 0.1 10*3/uL (ref 0.0–0.1)
EOS%: 0.3 % (ref 0.0–7.0)
Eosinophils Absolute: 0 10*3/uL (ref 0.0–0.5)
HEMATOCRIT: 42.4 % (ref 38.4–49.9)
HEMOGLOBIN: 13.7 g/dL (ref 13.0–17.1)
LYMPH#: 4.3 10*3/uL — AB (ref 0.9–3.3)
LYMPH%: 54.6 % — ABNORMAL HIGH (ref 14.0–49.0)
MCH: 31.4 pg (ref 27.2–33.4)
MCHC: 32.2 g/dL (ref 32.0–36.0)
MCV: 97.3 fL (ref 79.3–98.0)
MONO#: 0.8 10*3/uL (ref 0.1–0.9)
MONO%: 10.1 % (ref 0.0–14.0)
NEUT%: 34.1 % — ABNORMAL LOW (ref 39.0–75.0)
NEUTROS ABS: 2.7 10*3/uL (ref 1.5–6.5)
Platelets: 197 10*3/uL (ref 140–400)
RBC: 4.36 10*6/uL (ref 4.20–5.82)
RDW: 15.7 % — AB (ref 11.0–14.6)
WBC: 7.9 10*3/uL (ref 4.0–10.3)

## 2015-10-25 LAB — LACTATE DEHYDROGENASE: LDH: 238 U/L (ref 125–245)

## 2015-10-25 NOTE — Addendum Note (Signed)
Addended by: Randolm Idol on: 10/25/2015 09:35 AM   Modules accepted: Medications

## 2015-10-25 NOTE — Progress Notes (Signed)
Hematology and Oncology Follow Up Visit  MAE BEAUCHAMP [id number] 03-18-1925 81 y.o. 10/25/2015 9:18 AM [name], MDHopper, Darrick Penna, MD   Principle Diagnosis: 81 year old gentleman diagnosed with Malignant melanoma initially diagnosed in 2012 and found to have a T2b lesion with the depth of invasion of 1.75 mm and a Clark's level IV. He developed regional relapse in October of 2014.    Prior Therapy: 1. In May of 2012, he underwent wide excision and sentinel lymph node biopsy done by Dr. Hassell Done which showed no lymph node involvement and no residual malignant melanoma.  2. he is status post Re-excision of site of melanoma satellite lesion of left forearm. This was done on 08/23/2013.   Current therapy: Observation and surveillance.  Interim History:  Mr. Bonaccorso presents today for a followup visit. Since the last visit, he has been doing relatively fair. He did sustain a fall but did not suffer any specific fractures. He did not report any syncope or seizures but simply tripped in his backyard. He did have about 3 weeks of pain but did not require any orthopedic intervention.   He continues to be on steroids for his autoimmune hepatitis and no major changes in that. He does not report any skin rashes or lesions. Has not reported any new constitutional symptoms. He continues to drive and attends to activities of daily living. 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   He does not report any headaches or blurry vision or double vision. Does not report any chest pain or shortness of breath. As that report any cough or hemoptysis. Does not report any nausea or vomiting or abdominal pain. Did not report any hematochezia or melena. Did not report any frequency urgency or hesitancy. Does not report any musculoskeletal complaints. Her recent rashes or lesions. The rest of the review of system is unremarkable.   Medications: I have reviewed the patient's current medications.  Current  Outpatient Prescriptions  Medication Sig Dispense Refill  . losartan (COZAAR) 50 MG tablet TAKE 1 TABLET (50 MG TOTAL) BY MOUTH DAILY. 90 tablet 0  . metoprolol tartrate (LOPRESSOR) 25 MG tablet Take 25 mg by mouth daily.  1  . predniSONE (DELTASONE) 10 MG tablet Take 1 tablet (10 mg total) by mouth daily with breakfast. 100 tablet 3  . warfarin (COUMADIN) 5 MG tablet TAKE AS DIRECTED BY COUMADIN CLINIC. 30 tablet 2   No current facility-administered medications for this visit.     Allergies:  Allergies  Allergen Reactions  . Penicillins Hives       Physical Exam: Blood pressure 131/66, pulse 60, temperature 97.6 F (36.4 C), temperature source Oral, resp. rate 18, height 6\' 1"  (1.854 m), weight 200 lb 8 oz (90.946 kg), SpO2 99 %. ECOG: 1 General appearance: alert awake well-appearing gentleman without distress. Head: Normocephalic, without any masses or lesions. No oral ulcers. Neck: no adenopathy or thyromegaly. Lymph nodes: Cervical, supraclavicular, and axillary nodes normal.no inguinal adenopathy noted either. Heart:regular rate and rhythm, S1, S2 normal, no murmur, click, rub or gallop Lung:chest clear, no wheezing, rales, normal symmetric air entry Abdomin: soft, non-tender, without masses or organomegaly no shifting dullness or ascites. EXT:no erythema, induration, or nodules Skin showed no rashes or lesions.   Lab Results: Lab Results  Component Value Date   WBC 7.9 10/25/2015   HGB 13.7 10/25/2015   HCT 42.4 10/25/2015   MCV 97.3 10/25/2015   PLT 197 10/25/2015     Chemistry  Component Value Date/Time   NA 137 05/06/2015 0428   NA 140 04/24/2015 0844   K 4.4 05/06/2015 0428   K 5.1 04/24/2015 0844   CL 106 05/06/2015 0428   CO2 25 05/06/2015 0428   CO2 27 04/24/2015 0844   BUN 24* 05/06/2015 0428   BUN 29.4* 04/24/2015 0844   CREATININE 1.15 05/06/2015 0428   CREATININE 1.4* 04/24/2015 0844   CREATININE 1.51* 04/20/2013 1639      Component  Value Date/Time   CALCIUM 8.3* 05/06/2015 0428   CALCIUM 8.6 04/24/2015 0844   ALKPHOS 77 09/03/2015 1404   ALKPHOS 80 04/24/2015 0844   AST 85* 09/03/2015 1404   AST 37* 04/24/2015 0844   ALT 125* 09/03/2015 1404   ALT 45 04/24/2015 0844   BILITOT 0.8 09/03/2015 1404   BILITOT 1.08 04/24/2015 4832      82 year old gentleman with the following issues:   1. Malignant melanoma initially diagnosed in 2012 and found to have a T2b lesion with the depth of invasion of 1.75 mm and a Clark's level IV. No ulcerations or vascular invasion was noted and was treated with a wide excision and a sentinel lymph node biopsy that was negative. He had a punch biopsy of a lesion in his left elbow which showed deposits of atypical melanocytes. He is status post reexcision of his melanoma lesion done on 08/23/2013. The pathology reveals area of malignant melanoma with negative resection margins.   His PET CT scan on 04/24/2015 did not show any evidence of metastatic disease. His clinical visit and examination today did not suggest that either. He continues to have no evidence of disease overall at this time. I have recommended continued observation and surveillance with physical examination every 6 months and imaging studies once a year. He is to continue with annual dermatological examination which she is doing.  2. Followup: Will be in 6 months for clinical visit and repeat imaging studies.   [facility], MD 12/8/20169:18 AM

## 2015-10-25 NOTE — Telephone Encounter (Signed)
per pof to sch pt appt-per pt req to mail copy of sch °

## 2015-10-26 ENCOUNTER — Other Ambulatory Visit: Payer: Self-pay | Admitting: Cardiovascular Disease

## 2015-10-28 ENCOUNTER — Emergency Department (HOSPITAL_COMMUNITY): Payer: Medicare Other

## 2015-10-28 ENCOUNTER — Encounter (HOSPITAL_COMMUNITY): Payer: Self-pay | Admitting: *Deleted

## 2015-10-28 ENCOUNTER — Inpatient Hospital Stay (HOSPITAL_COMMUNITY)
Admission: EM | Admit: 2015-10-28 | Discharge: 2015-10-31 | DRG: 193 | Disposition: A | Discharge status: Home / Self Care | Payer: Medicare Other | Attending: Internal Medicine | Admitting: Internal Medicine

## 2015-10-28 ENCOUNTER — Other Ambulatory Visit: Payer: Self-pay

## 2015-10-28 DIAGNOSIS — I10 Essential (primary) hypertension: Secondary | ICD-10-CM | POA: Diagnosis not present

## 2015-10-28 DIAGNOSIS — W19XXXA Unspecified fall, initial encounter: Secondary | ICD-10-CM | POA: Diagnosis present

## 2015-10-28 DIAGNOSIS — N289 Disorder of kidney and ureter, unspecified: Secondary | ICD-10-CM | POA: Diagnosis not present

## 2015-10-28 DIAGNOSIS — Z7952 Long term (current) use of systemic steroids: Secondary | ICD-10-CM

## 2015-10-28 DIAGNOSIS — Z79899 Other long term (current) drug therapy: Secondary | ICD-10-CM | POA: Diagnosis not present

## 2015-10-28 DIAGNOSIS — I493 Ventricular premature depolarization: Secondary | ICD-10-CM | POA: Diagnosis present

## 2015-10-28 DIAGNOSIS — D6832 Hemorrhagic disorder due to extrinsic circulating anticoagulants: Secondary | ICD-10-CM

## 2015-10-28 DIAGNOSIS — R7989 Other specified abnormal findings of blood chemistry: Secondary | ICD-10-CM

## 2015-10-28 DIAGNOSIS — N183 Chronic kidney disease, stage 3 unspecified: Secondary | ICD-10-CM | POA: Diagnosis present

## 2015-10-28 DIAGNOSIS — Y92009 Unspecified place in unspecified non-institutional (private) residence as the place of occurrence of the external cause: Secondary | ICD-10-CM | POA: Diagnosis not present

## 2015-10-28 DIAGNOSIS — T45515A Adverse effect of anticoagulants, initial encounter: Secondary | ICD-10-CM

## 2015-10-28 DIAGNOSIS — J189 Pneumonia, unspecified organism: Secondary | ICD-10-CM | POA: Diagnosis not present

## 2015-10-28 DIAGNOSIS — Z79891 Long term (current) use of opiate analgesic: Secondary | ICD-10-CM | POA: Diagnosis not present

## 2015-10-28 DIAGNOSIS — I2510 Atherosclerotic heart disease of native coronary artery without angina pectoris: Secondary | ICD-10-CM | POA: Diagnosis present

## 2015-10-28 DIAGNOSIS — M5137 Other intervertebral disc degeneration, lumbosacral region: Secondary | ICD-10-CM | POA: Diagnosis present

## 2015-10-28 DIAGNOSIS — I252 Old myocardial infarction: Secondary | ICD-10-CM

## 2015-10-28 DIAGNOSIS — L97809 Non-pressure chronic ulcer of other part of unspecified lower leg with unspecified severity: Secondary | ICD-10-CM | POA: Diagnosis present

## 2015-10-28 DIAGNOSIS — I878 Other specified disorders of veins: Secondary | ICD-10-CM | POA: Diagnosis present

## 2015-10-28 DIAGNOSIS — R778 Other specified abnormalities of plasma proteins: Secondary | ICD-10-CM

## 2015-10-28 DIAGNOSIS — Z89421 Acquired absence of other right toe(s): Secondary | ICD-10-CM

## 2015-10-28 DIAGNOSIS — R0600 Dyspnea, unspecified: Secondary | ICD-10-CM

## 2015-10-28 DIAGNOSIS — R062 Wheezing: Secondary | ICD-10-CM

## 2015-10-28 DIAGNOSIS — Z7901 Long term (current) use of anticoagulants: Secondary | ICD-10-CM

## 2015-10-28 DIAGNOSIS — J45909 Unspecified asthma, uncomplicated: Secondary | ICD-10-CM | POA: Diagnosis present

## 2015-10-28 DIAGNOSIS — K754 Autoimmune hepatitis: Secondary | ICD-10-CM | POA: Diagnosis present

## 2015-10-28 DIAGNOSIS — I248 Other forms of acute ischemic heart disease: Secondary | ICD-10-CM | POA: Diagnosis present

## 2015-10-28 DIAGNOSIS — Z8673 Personal history of transient ischemic attack (TIA), and cerebral infarction without residual deficits: Secondary | ICD-10-CM | POA: Diagnosis not present

## 2015-10-28 DIAGNOSIS — I83009 Varicose veins of unspecified lower extremity with ulcer of unspecified site: Secondary | ICD-10-CM | POA: Diagnosis present

## 2015-10-28 DIAGNOSIS — S51012A Laceration without foreign body of left elbow, initial encounter: Secondary | ICD-10-CM | POA: Diagnosis present

## 2015-10-28 DIAGNOSIS — Z880 Allergy status to penicillin: Secondary | ICD-10-CM

## 2015-10-28 DIAGNOSIS — R791 Abnormal coagulation profile: Secondary | ICD-10-CM | POA: Diagnosis present

## 2015-10-28 DIAGNOSIS — R6510 Systemic inflammatory response syndrome (SIRS) of non-infectious origin without acute organ dysfunction: Secondary | ICD-10-CM | POA: Diagnosis present

## 2015-10-28 DIAGNOSIS — Z87891 Personal history of nicotine dependence: Secondary | ICD-10-CM | POA: Diagnosis not present

## 2015-10-28 DIAGNOSIS — J9601 Acute respiratory failure with hypoxia: Secondary | ICD-10-CM | POA: Diagnosis not present

## 2015-10-28 DIAGNOSIS — Z85820 Personal history of malignant melanoma of skin: Secondary | ICD-10-CM

## 2015-10-28 DIAGNOSIS — I129 Hypertensive chronic kidney disease with stage 1 through stage 4 chronic kidney disease, or unspecified chronic kidney disease: Secondary | ICD-10-CM | POA: Diagnosis present

## 2015-10-28 DIAGNOSIS — L97909 Non-pressure chronic ulcer of unspecified part of unspecified lower leg with unspecified severity: Secondary | ICD-10-CM

## 2015-10-28 DIAGNOSIS — I4891 Unspecified atrial fibrillation: Secondary | ICD-10-CM | POA: Diagnosis present

## 2015-10-28 DIAGNOSIS — J9600 Acute respiratory failure, unspecified whether with hypoxia or hypercapnia: Secondary | ICD-10-CM | POA: Diagnosis present

## 2015-10-28 DIAGNOSIS — E785 Hyperlipidemia, unspecified: Secondary | ICD-10-CM | POA: Diagnosis present

## 2015-10-28 LAB — COMPREHENSIVE METABOLIC PANEL
ALT: 57 U/L (ref 17–63)
ANION GAP: 8 (ref 5–15)
AST: 62 U/L — AB (ref 15–41)
Albumin: 3.1 g/dL — ABNORMAL LOW (ref 3.5–5.0)
Alkaline Phosphatase: 105 U/L (ref 38–126)
BUN: 21 mg/dL — AB (ref 6–20)
CHLORIDE: 102 mmol/L (ref 101–111)
CO2: 27 mmol/L (ref 22–32)
Calcium: 8.7 mg/dL — ABNORMAL LOW (ref 8.9–10.3)
Creatinine, Ser: 1.46 mg/dL — ABNORMAL HIGH (ref 0.61–1.24)
GFR, EST AFRICAN AMERICAN: 48 mL/min — AB (ref >60)
GFR, EST NON AFRICAN AMERICAN: 41 mL/min — AB (ref >60)
Glucose, Bld: 98 mg/dL (ref 65–99)
POTASSIUM: 4.9 mmol/L (ref 3.5–5.1)
Sodium: 137 mmol/L (ref 135–145)
TOTAL PROTEIN: 6.8 g/dL (ref 6.5–8.1)
Total Bilirubin: 1.4 mg/dL — ABNORMAL HIGH (ref 0.3–1.2)

## 2015-10-28 LAB — CBC
HCT: 46.8 % (ref 39.0–52.0)
Hemoglobin: 15.2 g/dL (ref 13.0–17.0)
MCH: 32 pg (ref 26.0–34.0)
MCHC: 32.5 g/dL (ref 30.0–36.0)
MCV: 98.5 fL (ref 78.0–100.0)
PLATELETS: 196 10*3/uL (ref 150–400)
RBC: 4.75 MIL/uL (ref 4.22–5.81)
RDW: 15.5 % (ref 11.5–15.5)
WBC: 12.8 10*3/uL — AB (ref 4.0–10.5)

## 2015-10-28 LAB — I-STAT CG4 LACTIC ACID, ED
LACTIC ACID, VENOUS: 2.1 mmol/L — AB (ref 0.5–2.0)
LACTIC ACID, VENOUS: 4.31 mmol/L — AB (ref 0.5–2.0)

## 2015-10-28 LAB — BRAIN NATRIURETIC PEPTIDE: B NATRIURETIC PEPTIDE 5: 700.1 pg/mL — AB (ref 0.0–100.0)

## 2015-10-28 LAB — PROTIME-INR
INR: 3.36 — AB (ref 0.00–1.49)
PROTHROMBIN TIME: 33.3 s — AB (ref 11.6–15.2)

## 2015-10-28 LAB — INFLUENZA PANEL BY PCR (TYPE A & B)
H1N1FLUPCR: NOT DETECTED
INFLAPCR: NEGATIVE
INFLBPCR: NEGATIVE

## 2015-10-28 LAB — TROPONIN I
TROPONIN I: 0.2 ng/mL — AB (ref <0.031)
Troponin I: 0.15 ng/mL — ABNORMAL HIGH (ref <0.031)

## 2015-10-28 LAB — LACTIC ACID, PLASMA
LACTIC ACID, VENOUS: 2.1 mmol/L — AB (ref 0.5–2.0)
Lactic Acid, Venous: 4 mmol/L (ref 0.5–2.0)

## 2015-10-28 LAB — MRSA PCR SCREENING: MRSA BY PCR: NEGATIVE

## 2015-10-28 MED ORDER — SODIUM CHLORIDE 0.9 % IV BOLUS (SEPSIS)
500.0000 mL | Freq: Once | INTRAVENOUS | Status: AC
  Administered 2015-10-28: 500 mL via INTRAVENOUS

## 2015-10-28 MED ORDER — LEVOFLOXACIN IN D5W 750 MG/150ML IV SOLN
750.0000 mg | Freq: Once | INTRAVENOUS | Status: DC
  Administered 2015-10-28: 750 mg via INTRAVENOUS
  Filled 2015-10-28: qty 150

## 2015-10-28 MED ORDER — LEVALBUTEROL HCL 0.63 MG/3ML IN NEBU
0.6300 mg | INHALATION_SOLUTION | Freq: Four times a day (QID) | RESPIRATORY_TRACT | Status: DC | PRN
  Administered 2015-10-29 (×2): 0.63 mg via RESPIRATORY_TRACT
  Filled 2015-10-28 (×2): qty 3

## 2015-10-28 MED ORDER — WARFARIN - PHARMACIST DOSING INPATIENT: Freq: Every day | Status: DC

## 2015-10-28 MED ORDER — VANCOMYCIN HCL 10 G IV SOLR
1250.0000 mg | INTRAVENOUS | Status: DC
  Administered 2015-10-29: 1250 mg via INTRAVENOUS
  Filled 2015-10-28: qty 1250

## 2015-10-28 MED ORDER — HYDROCORTISONE NA SUCCINATE PF 100 MG IJ SOLR
50.0000 mg | Freq: Three times a day (TID) | INTRAMUSCULAR | Status: DC
  Administered 2015-10-28 – 2015-10-29 (×3): 50 mg via INTRAVENOUS
  Filled 2015-10-28 (×3): qty 2

## 2015-10-28 MED ORDER — SODIUM CHLORIDE 0.9 % IV SOLN
INTRAVENOUS | Status: DC
  Administered 2015-10-28 (×2): via INTRAVENOUS

## 2015-10-28 MED ORDER — VANCOMYCIN HCL 10 G IV SOLR: 1250.0000 mg | INTRAVENOUS | Status: DC

## 2015-10-28 MED ORDER — ALBUTEROL SULFATE (2.5 MG/3ML) 0.083% IN NEBU
5.0000 mg | INHALATION_SOLUTION | Freq: Once | RESPIRATORY_TRACT | Status: AC
  Administered 2015-10-28: 5 mg via RESPIRATORY_TRACT
  Filled 2015-10-28: qty 6

## 2015-10-28 MED ORDER — DEXTROSE 5 % IV SOLN
2.0000 g | Freq: Three times a day (TID) | INTRAVENOUS | Status: DC
  Administered 2015-10-28 – 2015-10-29 (×2): 2 g via INTRAVENOUS
  Filled 2015-10-28 (×5): qty 2

## 2015-10-28 MED ORDER — VANCOMYCIN HCL IN DEXTROSE 1-5 GM/200ML-% IV SOLN
1000.0000 mg | Freq: Once | INTRAVENOUS | Status: AC
  Administered 2015-10-28: 1000 mg via INTRAVENOUS
  Filled 2015-10-28: qty 200

## 2015-10-28 MED ORDER — DOXYCYCLINE HYCLATE 100 MG IV SOLR
100.0000 mg | Freq: Once | INTRAVENOUS | Status: AC
  Administered 2015-10-28: 100 mg via INTRAVENOUS
  Filled 2015-10-28: qty 100

## 2015-10-28 MED ORDER — SODIUM CHLORIDE 0.9 % IV BOLUS (SEPSIS)
1000.0000 mL | INTRAVENOUS | Status: AC
  Administered 2015-10-28 (×3): 1000 mL via INTRAVENOUS

## 2015-10-28 MED ORDER — AZTREONAM 2 G IJ SOLR
2.0000 g | Freq: Once | INTRAMUSCULAR | Status: DC
  Filled 2015-10-28: qty 2

## 2015-10-28 MED ORDER — DEXTROSE 5 % IV SOLN
2.0000 g | Freq: Once | INTRAVENOUS | Status: AC
  Administered 2015-10-28: 2 g via INTRAVENOUS
  Filled 2015-10-28: qty 2

## 2015-10-28 MED ORDER — SODIUM CHLORIDE 0.9 % IV SOLN
INTRAVENOUS | Status: DC
  Administered 2015-10-28: 12:00:00 via INTRAVENOUS

## 2015-10-28 NOTE — ED Notes (Signed)
2L O2 applied.

## 2015-10-28 NOTE — Progress Notes (Signed)
10/28/15  Pharmacy- Vancomycin  81yo male admitted with R/O pneumonia.  Pt was originally on Vanc and Aztreonam, with Vancomycin d/c'd after loading dose was administered.  Now to continue Vancomycin, in order to broaden coverage as patient has autoimmune hepatitis and is on chronic steroids.  Vancomycin 1000mg  IV x 1 given at 1200  1-  Continue Vancomycin 1250mg  IV q24, next dose 12/12 2-  Watch renal fxn, f/u any cultures 3-  Steady state trough as appropriate.  Gracy Bruins, PharmD Clinical Pharmacist [facility]

## 2015-10-28 NOTE — ED Provider Notes (Addendum)
CSN: [id number]     Arrival date & time 10/28/15  1000 History   First MD Initiated Contact with Patient 10/28/15 1001     Chief Complaint  Patient presents with  . Shortness of Breath     (Consider location/radiation/quality/duration/timing/severity/associated sxs/prior Treatment) Patient is a 81 y.o. male presenting with shortness of breath. The history is provided by the patient and the EMS personnel.  Shortness of Breath Associated symptoms: cough   Associated symptoms: no abdominal pain, no chest pain, no diaphoresis, no fever, no headaches, no neck pain, no rash, no sore throat and no vomiting   Patient w hx cad, afib, c/o sob since yesterday. Denies hx chronic lung disease, asthma, or copd. Non smoker. States sob acute onset yesterday. +increase in non prod cough in the past day. Chest congestion. Denies chest pain or discomfort or any sort. No sore throat, runny nose, or other uri c/o. No fever or chills. No sweats. No nv.   Denies increased leg edema, no orthopnea or pnd.  Saw his cardiologist last week for routine check, was asymptomatic then, denies any change in meds. Compliant w meds. Is on coumadin, denies any abn bleeding, no hemoptysis.      Past Medical History  Diagnosis Date  . Coronary artery disease     MI, atherectomy 1993  . Hyperlipidemia   . Arrhythmia     1995  . Atrial fibrillation ([place])   . History of transient ischemic attack (TIA) 2005    double vision  . Hypertension   . History of gallstones   . Myocardial infarct ([place]) 12/98    inferior  . Eczema   . Elevated LFTs   . Skin cancer   . DDD (degenerative disc disease)   . Pneumonia     long time ago  . Varicose veins    Past Surgical History  Procedure Laterality Date  . Cardiac catheterization  04/08/99    LAD 70% INTER 40-50% RCA 30-40%  . Appendectomy    . Coronary angioplasty  04/08/99    LAD  . Coronary angioplasty with stent placement  04/08/99    LAD  . Coronary angioplasty   10/17/97    RCA   . Melanoma excision  2013    lt arm  . Tonsillectomy    . Amputation Right 05/10/2013    Procedure: RIGHT SECOND TOE AMPUTATION;  Surgeon: Hessie Dibble, MD;  Location: [facility];  Service: Orthopedics;  Laterality: Right;  . Melanoma excision Left 08/23/2013    Procedure: MELANOMA EXCISION;  Surgeon: Pedro Earls, MD;  Location: [facility];  Service: General;  Laterality: Left;  . Video bronchoscopy Bilateral 05/07/2015    Procedure: VIDEO BRONCHOSCOPY WITHOUT FLUORO;  Surgeon: Rigoberto Noel, MD;  Location: [facility];  Service: Cardiopulmonary;  Laterality: Bilateral;   Family History  Problem Relation Age of Onset  . Heart attack Mother 55    MI  . Pneumonia Father    Social History  Substance Use Topics  . Smoking status: Former Smoker -- 7 years    Types: 14, Pipe    Quit date: 11/18/1967  . Smokeless tobacco: Never Used     Comment: 1-2 cigars a day, pipe  . Alcohol Use: 0.0 oz/week    0 Standard drinks or equivalent per week     Comment: 2-3 glasses of wine in the evening    Review of Systems  Constitutional: Negative for fever, chills and diaphoresis.  HENT: Negative for  sore throat.   Eyes: Negative for redness.  Respiratory: Positive for cough and shortness of breath.   Cardiovascular: Negative for chest pain and palpitations.  Gastrointestinal: Negative for vomiting, abdominal pain and diarrhea.  Genitourinary: Negative for flank pain.  Musculoskeletal: Negative for back pain and neck pain.  Skin: Negative for rash.  Neurological: Negative for headaches.  Hematological: Does not bruise/bleed easily.  Psychiatric/Behavioral: Negative for confusion.      Allergies  Penicillins  Home Medications   Prior to Admission medications   Medication Sig Start Date End Date Taking? Authorizing Provider  losartan (COZAAR) 50 MG tablet TAKE 1 TABLET (50 MG TOTAL) BY MOUTH DAILY. 10/26/15   Sherren Mocha, MD  metoprolol  tartrate (LOPRESSOR) 25 MG tablet Take 25 mg by mouth daily. 07/09/15   Historical Provider, MD  predniSONE (DELTASONE) 10 MG tablet Take 1 tablet (10 mg total) by mouth daily with breakfast. 05/14/15   Elsie Stain, MD  warfarin (COUMADIN) 5 MG tablet TAKE AS DIRECTED BY COUMADIN CLINIC. 08/23/15   Sherren Mocha, MD   BP 139/88 mmHg  Pulse 88  Temp(Src) 99.5 F (37.5 C) (Oral)  Ht 6\' 2"  (1.88 m)  Wt 88.451 kg  BMI 25.03 kg/m2  SpO2 93% Physical Exam  Constitutional: He is oriented to person, place, and time. He appears well-developed and well-nourished. No distress.  HENT:  Head: Atraumatic.  Mouth/Throat: Oropharynx is clear and moist.  Eyes: Conjunctivae are normal. No scleral icterus.  Neck: Neck supple. No JVD present. No tracheal deviation present.  Cardiovascular: Normal rate, normal heart sounds and intact distal pulses.  Exam reveals no gallop and no friction rub.   No murmur heard. Pulmonary/Chest: Effort normal. No accessory muscle usage. No respiratory distress.  Rhonchi bil. Mild wheezing bil.   Abdominal: Soft. Bowel sounds are normal. He exhibits no distension. There is no tenderness.  Musculoskeletal: Normal range of motion. He exhibits no tenderness.  Mild symmetric bil ankle/lower leg edema.   Neurological: He is alert and oriented to person, place, and time.  Skin: Skin is warm and dry. No rash noted. He is not diaphoretic.  Psychiatric: He has a normal mood and affect.  Nursing note and vitals reviewed.   ED Course  Procedures (including critical care time) Labs Review   Results for orders placed or performed during the hospital encounter of 10/28/15  CBC  Result Value Ref Range   WBC 12.8 (H) 4.0 - 10.5 K/uL   RBC 4.75 4.22 - 5.81 MIL/uL   Hemoglobin 15.2 13.0 - 17.0 g/dL   HCT 46.8 39.0 - 52.0 %   MCV 98.5 78.0 - 100.0 fL   MCH 32.0 26.0 - 34.0 pg   MCHC 32.5 30.0 - 36.0 g/dL   RDW 15.5 11.5 - 15.5 %   Platelets 196 150 - 400 K/uL   Comprehensive metabolic panel  Result Value Ref Range   Sodium 137 135 - 145 mmol/L   Potassium 4.9 3.5 - 5.1 mmol/L   Chloride 102 101 - 111 mmol/L   CO2 27 22 - 32 mmol/L   Glucose, Bld 98 65 - 99 mg/dL   BUN 21 (H) 6 - 20 mg/dL   Creatinine, Ser 1.46 (H) 0.61 - 1.24 mg/dL   Calcium 8.7 (L) 8.9 - 10.3 mg/dL   Total Protein 6.8 6.5 - 8.1 g/dL   Albumin 3.1 (L) 3.5 - 5.0 g/dL   AST 62 (H) 15 - 41 U/L   ALT 57 17 - 63 U/L  Alkaline Phosphatase 105 38 - 126 U/L   Total Bilirubin 1.4 (H) 0.3 - 1.2 mg/dL   GFR calc non Af Amer 41 (L) >60 mL/min   GFR calc Af Amer 48 (L) >60 mL/min   Anion gap 8 5 - 15  Troponin I  Result Value Ref Range   Troponin I 0.20 (H) <0.031 ng/mL  Brain natriuretic peptide  Result Value Ref Range   B Natriuretic Peptide 700.1 (H) 0.0 - 100.0 pg/mL  Protime-INR  Result Value Ref Range   Prothrombin Time 33.3 (H) 11.6 - 15.2 seconds   INR 3.36 (H) 0.00 - 1.49  I-Stat CG4 Lactic Acid, ED  Result Value Ref Range   Lactic Acid, Venous 2.10 (HH) 0.5 - 2.0 mmol/L   Comment NOTIFIED PHYSICIAN    Dg Chest 2 View  10/28/2015  CLINICAL DATA:  Shortness of breath with productive cough for 3 days EXAM: CHEST  2 VIEW COMPARISON:  June 07, 2015 FINDINGS: There is airspace consolidation in the posterior segment of the right upper lobe. There is scarring in the right lower lobe and to a lesser extent left base, stable. Heart is slightly enlarged with pulmonary vascularity within normal limits. There is atherosclerotic calcification in the aorta. No adenopathy apparent. There are surgical clips in left axilla. Bones are osteoporotic. IMPRESSION: Airspace consolidation consistent with pneumonia posterior segment right upper lobe. Areas of scarring in both lower lung zones, more on the right than on the left, are stable. No change in cardiac silhouette. Bones osteoporotic. Followup PA and lateral chest radiographs recommended in 3-4 weeks following trial of antibiotic therapy  to ensure resolution and exclude underlying malignancy. Electronically Signed   By: Lowella Grip III M.D.   On: 10/28/2015 10:41       I have personally reviewed and evaluated these images and lab results as part of my medical decision-making.   EKG Interpretation   Date/Time:  Sunday October 28 2015 10:12:08 EST Ventricular Rate:  98 PR Interval:    QRS Duration: 97 QT Interval:  345 QTC Calculation: 440 R Axis:   102 Text Interpretation:  Atrial fibrillation Premature ventricular complexes  Non-specific ST-t changes Confirmed by Ashok Cordia  MD, Lennette Bihari ([id number]) on  10/28/2015 12:27:38 PM      MDM   Iv ns. Labs. Cxr.  Reviewed nursing notes and prior charts for additional history.   Pt w rhonchi/rales on exam, and persistent/recurrent coughing - cxr resulted noted, lacate elev, wbc mildly elevated - suspect pna.    Initially ordered abx for CAP - pt noted w pcn allergy, therefore initially levaquin and vanc ordered - pt INR returns high, on coumadin, pt also noted w hx several months ago hemoptysis?pulm hem - therefore will avoid meds/abx that will further increase INR - aztreonam ordered in place of levaquin.   bp normal however marginal low, lactate mildly elevated - iv ns bolus.  Repeat lactate ordered.   Trop elevated. ecg does have some st dep laterally.  Pt denies any current or recent chest pain or discomfort.  During admission, may need serial trop checked, possible card consult.  Med service contacted for admission.  1515 repeat lactate higher than initial.  Recheck pt, no chest pain. States breathing seems improved. Initial abx given. bp 98/64.  Given lactate > 4, repeat being higher, will give total 30 cc/kg ns.     Pharmacy rec tx/coverage for atypical pna, given inc inr, will give dose doxy.  Admitting team/pharmacy/ID to decide on subsequent  inpatient coverage.  cxs pending.       Lajean Saver, MD 10/28/15 [phone]

## 2015-10-28 NOTE — ED Notes (Signed)
Pt presents via [facility] from home c/o SOB beginning last night worse with exertion.  Reports productive cough x 2-3 days, denies pain/nausea/vomiting.  BP-160/88 P-80s, O2-95%.  Pt  A x 4, NAD.  MD at bedside

## 2015-10-28 NOTE — ED Notes (Signed)
MD Regalado notified of Lactic 4.31

## 2015-10-28 NOTE — ED Notes (Signed)
Lactic acid 4.0 Critical result, MD aware.

## 2015-10-28 NOTE — H&P (Signed)
[facility] History and Physical                                                                                    Arthur [name], is a 81 y.o. male  MRN: [id number]   DOB - [birth year]  Admit Date - 10/28/2015  Outpatient Primary MD for the patient is [name], MD  Referring MD: Ashok Cordia / ER  With History of -  Past Medical History  Diagnosis Date  . Coronary artery disease     MI, atherectomy 1993  . Hyperlipidemia   . Arrhythmia     1995  . Atrial fibrillation ([place])   . History of transient ischemic attack (TIA) 2005    double vision  . Hypertension   . History of gallstones   . Myocardial infarct ([place]) 12/98    inferior  . Eczema   . Elevated LFTs   . Skin cancer   . DDD (degenerative disc disease)   . Pneumonia     long time ago  . Varicose veins       Past Surgical History  Procedure Laterality Date  . Cardiac catheterization  04/08/99    LAD 70% INTER 40-50% RCA 30-40%  . Appendectomy    . Coronary angioplasty  04/08/99    LAD  . Coronary angioplasty with stent placement  04/08/99    LAD  . Coronary angioplasty  10/17/97    RCA   . Melanoma excision  2013    lt arm  . Tonsillectomy    . Amputation Right 05/10/2013    Procedure: RIGHT SECOND TOE AMPUTATION;  Surgeon: Hessie Dibble, MD;  Location: [facility];  Service: Orthopedics;  Laterality: Right;  . Melanoma excision Left 08/23/2013    Procedure: MELANOMA EXCISION;  Surgeon: Pedro Earls, MD;  Location: [facility];  Service: General;  Laterality: Left;  . Video bronchoscopy Bilateral 05/07/2015    Procedure: VIDEO BRONCHOSCOPY WITHOUT FLUORO;  Surgeon: Rigoberto Noel, MD;  Location: [facility];  Service: Cardiopulmonary;  Laterality: Bilateral;    in for   Chief Complaint  Patient presents with  . Shortness of Breath     HPI This is an 81 year old male patient known medical history of CAD, atrial fibrillation on Coumadin, autoimmune hepatitis on chronic steroids,  chronic venous stasis with recurrent stasis ulcers of the lower extremities, dyslipidemia, hypertension, and malignant melanoma followed by Dr. Alen Blew who presents to the hospital with less than 24 hours of cough with productive yellow sputum, congestion and shortness of breath. He reports in the past 24 hours he began having cough and felt congested. He reported thick yellow sputum expectorated. He was very short of breath last night. He has not had any nausea vomiting or diarrhea. He has not had any chest pain. He reports inadequate intake especially of fluids. He also has been having some intermittent waxing and waning nonsustained central chest palpitations. He did not receive the flu shot this year.  In the ER the patient had low-grade temperature 99.5, blood pressure was 126/82 with an MAP of 93, respirations were 18 and he was stable on room air with sats 99%.  Chest x-ray revealed air space consolidation consistent with pneumonia in the posterior segment of the right upper lobe. Since arrival patient's systolic blood pressure has dipped to as low as 99 prompting several fluid challenges. Aggressive fluid hydration was not initiated given the fact he has underlying moderate LVH and concerns for inadvertent volume overload and exacerbation of likely diastolic heart failure. He did have a mildly elevated lactic acid level of 2.10 and a BNP was elevated at 700. He had mild renal insufficiency with creatinine of 1.46 previous readings in June creatinine 1.15. White count was elevated at 12,800. His INR was somewhat elevated at 3.36. He has a penicillin allergy and given elevated INR EDP opted to utilize aztreonam as first-line agent. Before initiation of antibiotics blood cultures were obtained in the ER. Patient has not been having any chest pain and his troponin is mildly elevated at 0.2. His EKG was nonischemic and demonstrated atrial fibrillation with ventricular rate 98 bpm and voltage criteria met for LVH,  he was having a few PVCs.   Review of Systems   In addition to the HPI above,  No reported Fever-chills, myalgias or other constitutional symptoms No Headache, changes with Vision or hearing, new weakness, tingling, numbness in any extremity, No problems swallowing food or Liquids, indigestion/reflux No Chest pain, orthopnea  No Abdominal pain, N/V; no melena or hematochezia, no dark tarry stools No dysuria, hematuria or flank pain No new skin rashes, lesions, masses or bruises, No new joints pains-aches No recent weight gain or loss No polyuria, polydypsia or polyphagia,  *A full 10 point Review of Systems was done, except as stated above, all other Review of Systems were negative.  Social History Social History  Substance Use Topics  . Smoking status: Former Smoker -- 7 years    Types: 52, Pipe    Quit date: 11/18/1967  . Smokeless tobacco: Never Used     Comment: 1-2 cigars a day, pipe  . Alcohol Use: 0.0 oz/week    0 Standard drinks or equivalent per week     Comment: 2-3 glasses of wine in the evening    Resides at: Private residence  Lives with: Spouse  Ambulatory status: Without assistive devices   Family History Family History  Problem Relation Age of Onset  . Heart attack Mother 22    MI  . Pneumonia Father      Prior to Admission medications   Medication Sig Start Date End Date Taking? Authorizing Provider  losartan (COZAAR) 50 MG tablet TAKE 1 TABLET (50 MG TOTAL) BY MOUTH DAILY. 10/26/15  Yes Sherren Mocha, MD  metoprolol tartrate (LOPRESSOR) 25 MG tablet Take 25 mg by mouth daily. 07/09/15  Yes Historical Provider, MD  predniSONE (DELTASONE) 10 MG tablet Take 1 tablet (10 mg total) by mouth daily with breakfast. 05/14/15  Yes Elsie Stain, MD  warfarin (COUMADIN) 5 MG tablet TAKE AS DIRECTED BY COUMADIN CLINIC. 08/23/15  Yes Sherren Mocha, MD    Allergies  Allergen Reactions  . Penicillins Hives    Physical Exam  Vitals  Blood  pressure 102/71, pulse 89, temperature 99.5 F (37.5 C), temperature source Oral, resp. rate 26, height _0  (1.88 m), weight 195 lb (88.451 kg), SpO2 96 %.   General:  In no acute distress, mildly toxic and appears stated age  Psych:  Normal affect, Denies Suicidal or Homicidal ideations, Awake Alert, Oriented X 3. Speech and thought patterns are clear and appropriate, no apparent short term memory deficits  Neuro:   No focal neurological deficits, CN II through XII intact, Strength 5/5 all 4 extremities, Sensation intact all 4 extremities.  ENT:  Ears and Eyes appear Normal, Conjunctivae clear, PER. Moist oral mucosa without erythema or exudates.  Neck:  Supple, No lymphadenopathy appreciated  Respiratory:  Symmetrical chest wall movement, Good air movement bilaterally, bilateral expiratory rhonchi, Room Air  Cardiac:  RRR, No Murmurs, bilateral LE edema noted with right greater than left with left being only trace, no JVD, No carotid bruits, peripheral pulses palpable at 2+  Abdomen:  Positive bowel sounds, Soft, Non tender, Non distended,  No masses appreciated, no obvious hepatosplenomegaly  Skin:  No Cyanosis, Normal Skin Turgor, No Skin Rash. Patient does have an area of bruising associated with reported recent trauma to lower right lower extremity-he has a diagonal linear skin tear in the superior aspect of this bruising with a punctate circular less than dime size lesion in the inferior portion of this bruised area. Note serous and blood-tinged drainage on the dressings applied prior to arrival.  Extremities: Symmetrical without obvious trauma or injury,  no effusions.  Data Review  CBC  Recent Labs Lab 10/25/15 0838 10/28/15 1101  WBC 7.9 12.8*  HGB 13.7 15.2  HCT 42.4 46.8  PLT 197 196  MCV 97.3 98.5  MCH 31.4 32.0  MCHC 32.2 32.5  RDW 15.7* 15.5  LYMPHSABS 4.3*  --   MONOABS 0.8  --   EOSABS 0.0  --   BASOSABS 0.1  --     Chemistries   Recent Labs Lab  10/25/15 0839 10/28/15 1101  NA 140 137  K 5.4* 4.9  CL  --  102  CO2 22 27  GLUCOSE 128 98  BUN 23.2 21*  CREATININE 1.4* 1.46*  CALCIUM 8.9 8.7*  AST 54* 62*  ALT 60* 57  ALKPHOS 117 105  BILITOT 0.93 1.4*    estimated creatinine clearance is 40.7 mL/min (by C-G formula based on Cr of 1.46).  No results for input(s): TSH, T4TOTAL, T3FREE, THYROIDAB in the last 72 hours.  Invalid input(s): FREET3  Coagulation profile  Recent Labs Lab 10/28/15 1101  INR 3.36*    No results for input(s): DDIMER in the last 72 hours.  Cardiac Enzymes  Recent Labs Lab 10/28/15 1101  TROPONINI 0.20*    Invalid input(s): POCBNP  Urinalysis    Component Value Date/Time   COLORURINE YELLOW 05/05/2015 0134   APPEARANCEUR CLEAR 05/05/2015 0134   LABSPEC 1.018 05/05/2015 0134   PHURINE 5.0 05/05/2015 0134   GLUCOSEU NEGATIVE 05/05/2015 0134   GLUCOSEU NEGATIVE 09/03/2007 0905   HGBUR NEGATIVE 05/05/2015 0134   HGBUR negative 10/29/2010 0807   BILIRUBINUR NEGATIVE 05/05/2015 0134   KETONESUR NEGATIVE 05/05/2015 0134   PROTEINUR NEGATIVE 05/05/2015 0134   UROBILINOGEN 1.0 05/05/2015 0134   NITRITE NEGATIVE 05/05/2015 0134   LEUKOCYTESUR NEGATIVE 05/05/2015 0134    Imaging results:   Dg Chest 2 View  10/28/2015  CLINICAL DATA:  Shortness of breath with productive cough for 3 days EXAM: CHEST  2 VIEW COMPARISON:  June 07, 2015 FINDINGS: There is airspace consolidation in the posterior segment of the right upper lobe. There is scarring in the right lower lobe and to a lesser extent left base, stable. Heart is slightly enlarged with pulmonary vascularity within normal limits. There is atherosclerotic calcification in the aorta. No adenopathy apparent. There are surgical clips in left axilla. Bones are osteoporotic. IMPRESSION: Airspace consolidation consistent with pneumonia posterior segment right  upper lobe. Areas of scarring in both lower lung zones, more on the right than on the  left, are stable. No change in cardiac silhouette. Bones osteoporotic. Followup PA and lateral chest radiographs recommended in 3-4 weeks following trial of antibiotic therapy to ensure resolution and exclude underlying malignancy. Electronically Signed   By: Lowella Grip III M.D.   On: 10/28/2015 10:41   Dg Lumbar Spine Complete  10/04/2015  CLINICAL DATA:  [place] 4 days ago, LEFT side low back pain and spasms, no sciatica, initial encounter EXAM: LUMBAR SPINE - COMPLETE 4+ VIEW COMPARISON:  None; correlation CT abdomen and pelvis 09/21/2007 FINDINGS: Calcified gallstones RIGHT upper quadrant 2.0 cm greatest size. Osseous demineralization. Five non-rib-bearing lumbar vertebra. Thoracolumbar levoconvex scoliosis. Multilevel disc space narrowing and bulky endplate spur formation. Multilevel facet degenerative changes. Grade 1 anterolisthesis L5-S1 with degenerative disc and facet disease changes at this level. Suboptimal visualization of the pars interarticularis regions at L5, questionable pars defects noted. Vertebral body heights maintained without fracture or bone destruction. No additional subluxation. Extensive atherosclerotic calcifications. IMPRESSION: Osseous demineralization. Multilevel advanced degenerative disc and facet disease changes with associated levoconvex scoliosis and grade 1 spondylolisthesis L5-S1. Cannot exclude spondylolysis at L5. Electronically Signed   By: Lavonia Dana M.D.   On: 10/04/2015 08:15   Dg Elbow Complete Right  10/04/2015  CLINICAL DATA:  Acute right elbow pain after fall 4 days ago. EXAM: RIGHT ELBOW - COMPLETE 3+ VIEW COMPARISON:  None. FINDINGS: There is no evidence of fracture, dislocation, or joint effusion. There is no evidence of arthropathy or other focal bone abnormality. Soft tissues are unremarkable. IMPRESSION: Normal right elbow. Electronically Signed   By: Marijo Conception, M.D.   On: 10/04/2015 08:13   Dg Hand Complete Right  10/04/2015  CLINICAL  DATA:  Fall with hand pain 4 days EXAM: RIGHT HAND - COMPLETE 3+ VIEW COMPARISON:  None. FINDINGS: Negative for fracture Degenerative change in the wrist joint with widening of the scapholunate space compatible with ligament injury. This could be acute but more likely chronic given the joint space narrowing. Degenerative change in the second and third MCP. Degenerative change in the PIP and DIP joints. Mild degenerative angulation of the third and fourth PIP joint. Moderate degenerative change in the fifth DIP joint. Diffuse arterial calcification. IMPRESSION: Negative for fracture. Scapholunate ligament disruption, likely chronic but correlate with history. There is moderate joint space narrowing in the radiocarpal joint suggesting chronic injury and cartilage loss Osteoarthritis throughout the hand. Electronically Signed   By: Franchot Gallo M.D.   On: 10/04/2015 08:14     EKG: (Independently reviewed)  atrial fibrillation with ventricular rate 90 bpm, QTC 440 ms, voltage criteria for LVH, PVC, nonspecific ST changes in V5 and the 6 unchanged from previous EKGs 10/15/2015 and 10/10/2014   Assessment & Plan  Principal Problem:   Acute respiratory failure /CAP -SDU -Empiric Aztreonam and doxycycline -Follow up on blood cultures -As per pneumonia order set: Check HIV, urinary strep and legionella, sputum culture -Check influenza PCR, respiratory viral panel, legionella antibody and adenovirus antibodies -Supportive care/pulmonary toileting/early mobilization -Check ambulatory pulse oximetry on room air  Active Problems:   Acute renal insufficiency -Secondary to poor perfusion in setting of infection and suspected atrial fibrillation with RVR at home (reports of palpitations) -Hydrate and follow labs    SIRS  -Lactic acid mildly elevated and although patient's blood pressure is suboptimal  MAP is greater than 65, white count is less than 15,000, patient  is alert and oriented, and his capillary  refill is less than 2 seconds therefore does not meet criteria at this juncture for sepsis **Since initially evaluated low up lactic acid has increased to 4.31 prompting an additional 1 L fluid challenge; we will also increase IV fluids to 100 mL/h and continue to cycle lactic acid -We'll cycle lactic acid every 3 hours until normalized -Gentle IV fluid hydration and periodic fluid challenges as indicated: Patient has received a total of 1 L of fluids by bolus in the ER, I have ordered an additional 500 mL bolus and will utilize fluids at 75 mL per hour for now -Frequent vital sign checks    Essential hypertension -Current blood pressure suboptimal so holding pre admission Cozaar    Coronary atherosclerosis -Current not experiencing chest pain although does have mild elevation in troponin when she is likely demand ischemia -Cycle cardiac enzymes -If develops chest pain repeat EKG -Holding beta blocker in setting of suboptimal blood pressure    Atrial fibrillation ([facility]) -Currently rate controlled but monitor for rebound tachycardia while beta blocker on hold -Reports palpitations prior to admission-no evidence of RVR on telemetry since arrival    Autoimmune hepatitis treated with steroids ([facility]) -On prednisone 10 mg daily, patient reports slow wean has been in process by gastroenterologist -Given suboptimal blood pressures in setting of acute infectious process will utilize stress dose steroids initially: Solu Cortef 50 g IV every 8 hours    HLD (hyperlipidemia) -Not on statin prior to admission    Current use of long term anticoagulation -Resulting INR mildly therapeutic -Coumadin dosing and management per pharmacy    Chronic cutaneous venous stasis ulcer ([place]) -Apparently has history of recurrent lower extremity stasis ulcers -Patient has an area on the right lower extremity that is bruised and has an area of obviuos trauma as well as a punctate ulcer that appears to be more  consistent with a stasis ulcer; no purulent and only draining serosanguineous fluid -WOC RN to evaluate    DVT Prophylaxis: Warfarin  Family Communication:  Wife at bedside   Code Status:  Full code  Condition:  Guarded  Discharge disposition: Anticipate discharge back to home environment once medically stable likely in the next 4 days  Time spent in minutes : 60      Ned Kakar L. ANP on 10/28/2015 at 1:29 PM  You may contact me by going to [email or web address] - password [email or web address]  I am available from 7a-7p but please confirm I am on the schedule by going to Amion as above.   After 7p please contact night coverage person covering me after hours  [facility]

## 2015-10-28 NOTE — Progress Notes (Signed)
10/28/2015 Patient transfer from the emergency room to 2 central at 1700. He is alert, oriented and ambulatory. Patient been falling at home he have a wound on  Right lower leg with a purplish area, on the left elbow skintear and scab on elbow and left leg. Noted on the right elbow scab. On Droplet when arrive on unit. Receive CHG bath and MRSA swab. [facility] RN.

## 2015-10-28 NOTE — Progress Notes (Signed)
CRITICAL VALUE ALERT  Critical value received:  Lactic Acid 2.1  Date of notification:  10/28/2015  Time of notification:  1920  Critical value read back:Yes.    Nurse who received alert:  Rico Sheehan  MD notified (1st page):  Kathline Magic, NP  Time of first page:  [phone]  MD notified (2nd page):  Time of second page:  Responding MD:    Time MD responded:

## 2015-10-28 NOTE — ED Notes (Signed)
Attempted report 

## 2015-10-28 NOTE — Progress Notes (Addendum)
ANTIBIOTIC & ANTICOAGULATION CONSULT NOTE - INITIAL  Pharmacy Consult for Vancomycin + Azactam & Warfarin Indication: rule out pneumonia & hx Afib  Allergies  Allergen Reactions  . Penicillins Hives    Patient Measurements: Height: 6\' 2"  (188 cm) Weight: 195 lb (88.451 kg) IBW/kg (Calculated) : 82.2  Vital Signs: Temp: 99.5 F (37.5 C) (12/11 1012) Temp Source: Oral (12/11 1012) BP: 126/82 mmHg (12/11 1058) Pulse Rate: 90 (12/11 1058) Intake/Output from previous day:   Intake/Output from this shift:    Labs: No results for input(s): WBC, HGB, PLT, LABCREA, CREATININE in the last 72 hours. Estimated Creatinine Clearance: 42.4 mL/min (by C-G formula based on Cr of 1.4). No results for input(s): VANCOTROUGH, VANCOPEAK, VANCORANDOM, GENTTROUGH, GENTPEAK, GENTRANDOM, TOBRATROUGH, TOBRAPEAK, TOBRARND, AMIKACINPEAK, AMIKACINTROU, AMIKACIN in the last 72 hours.   Microbiology: No results found for this or any previous visit (from the past 720 hour(s)).  Medical History: Past Medical History  Diagnosis Date  . Coronary artery disease     MI, atherectomy 1993  . Hyperlipidemia   . Arrhythmia     1995  . Atrial fibrillation ([place])   . History of transient ischemic attack (TIA) 2005    double vision  . Hypertension   . History of gallstones   . Myocardial infarct ([place]) 12/98    inferior  . Eczema   . Elevated LFTs   . Skin cancer   . DDD (degenerative disc disease)   . Pneumonia     long time ago  . Varicose veins     Assessment: 71 YOM who presented to the [facility] on [facility]11 from home with worsening SOB for 1 day. CXR consistent with RUL PNA. Pharmacy consulted to start Azactam + Vancomycin for empiric CAP coverage. Antibiotic regimen discussed with MD - avoiding cephalosporins due to allergy to PCN, avoiding fluoroquinolones due to elevated INR. MD aware patient currently has no atypical coverage on board. If desired - could add Azithromycin. Tmax: 99.5, SCr 1.46, CrCl~35  ml/min  Vancomycin 1g IV x 1 and Azactam 2g IV x 1 already ordered to be given in the [facility].   Goal of Therapy:  Vancomycin trough level 15-20 mcg/ml  Proper antibiotics for infection/cultures adjusted for renal/hepatic function   Plan:  1. Vancomycin 1g IV x 1 followed by 1250 mg IV every 24 hours 2. Azactam 2g IV x 1 followed by 2g IV every 8 hours 3. Consider d/c of Vancomycin if MRSA PCR is negative 4. Will continue to follow renal function, culture results, LOT, and antibiotic de-escalation plans   Alycia Rossetti, PharmD, BCPS Clinical Pharmacist Pager: [phone] 10/28/2015 11:13 AM    ----------------------------------------------------------------------------------------------- Addendum:  Pharmacy now also consulted to resume warfarin from PTA for hx Afib. Admit INR 3.36 on PTA dose of 5 mg daily EXCEPT for 2.5 mg on Tues/Fri/Sun  Plan 1. Hold warfarin dose tonight 2. Daily PT/INR 3. Will continue to monitor for any signs/symptoms of bleeding and will follow up with PT/INR in the a.m.   Alycia Rossetti, PharmD, BCPS Clinical Pharmacist Pager: [phone] 10/28/2015 1:36 PM

## 2015-10-29 LAB — TROPONIN I
TROPONIN I: 0.08 ng/mL — AB (ref <0.031)
Troponin I: 0.11 ng/mL — ABNORMAL HIGH (ref <0.031)

## 2015-10-29 LAB — HIV ANTIBODY (ROUTINE TESTING W REFLEX): HIV SCREEN 4TH GENERATION: NONREACTIVE

## 2015-10-29 LAB — PROTIME-INR
INR: 4.37 — AB (ref 0.00–1.49)
Prothrombin Time: 40.6 seconds — ABNORMAL HIGH (ref 11.6–15.2)

## 2015-10-29 LAB — BASIC METABOLIC PANEL
Anion gap: 6 (ref 5–15)
BUN: 20 mg/dL (ref 6–20)
CO2: 23 mmol/L (ref 22–32)
CREATININE: 1.18 mg/dL (ref 0.61–1.24)
Calcium: 7.8 mg/dL — ABNORMAL LOW (ref 8.9–10.3)
Chloride: 111 mmol/L (ref 101–111)
GFR calc Af Amer: >60 mL/min (ref >60)
GFR, EST NON AFRICAN AMERICAN: 53 mL/min — AB (ref >60)
Glucose, Bld: 151 mg/dL — ABNORMAL HIGH (ref 65–99)
Potassium: 5 mmol/L (ref 3.5–5.1)
SODIUM: 140 mmol/L (ref 135–145)

## 2015-10-29 LAB — CBC
HCT: 38.2 % — ABNORMAL LOW (ref 39.0–52.0)
Hemoglobin: 12.3 g/dL — ABNORMAL LOW (ref 13.0–17.0)
MCH: 31.7 pg (ref 26.0–34.0)
MCHC: 32.2 g/dL (ref 30.0–36.0)
MCV: 98.5 fL (ref 78.0–100.0)
PLATELETS: 154 10*3/uL (ref 150–400)
RBC: 3.88 MIL/uL — ABNORMAL LOW (ref 4.22–5.81)
RDW: 16 % — ABNORMAL HIGH (ref 11.5–15.5)
WBC: 12.7 10*3/uL — ABNORMAL HIGH (ref 4.0–10.5)

## 2015-10-29 LAB — LEGIONELLA PNEUMOPHILA TOTAL AB: Legionella Pneumo Total Ab: <0.91 OD ratio (ref 0.00–0.90)

## 2015-10-29 MED ORDER — DEXTROSE 5 % IV SOLN
1.0000 g | Freq: Three times a day (TID) | INTRAVENOUS | Status: DC
  Administered 2015-10-29 – 2015-10-31 (×7): 1 g via INTRAVENOUS
  Filled 2015-10-29 (×9): qty 1

## 2015-10-29 MED ORDER — METOPROLOL TARTRATE 25 MG PO TABS
25.0000 mg | ORAL_TABLET | Freq: Two times a day (BID) | ORAL | Status: DC
  Administered 2015-10-29 (×2): 25 mg via ORAL
  Filled 2015-10-29 (×2): qty 1

## 2015-10-29 MED ORDER — LOSARTAN POTASSIUM 50 MG PO TABS
100.0000 mg | ORAL_TABLET | Freq: Every day | ORAL | Status: DC
  Administered 2015-10-29 – 2015-10-31 (×3): 100 mg via ORAL
  Filled 2015-10-29 (×3): qty 2

## 2015-10-29 MED ORDER — INFLUENZA VAC SPLIT QUAD 0.5 ML IM SUSY: 0.5000 mL | PREFILLED_SYRINGE | INTRAMUSCULAR | Status: DC

## 2015-10-29 MED ORDER — PREDNISONE 10 MG PO TABS
10.0000 mg | ORAL_TABLET | Freq: Every day | ORAL | Status: DC
  Administered 2015-10-29 – 2015-10-30 (×2): 10 mg via ORAL
  Filled 2015-10-29 (×2): qty 1

## 2015-10-29 MED ORDER — HYDRALAZINE HCL 20 MG/ML IJ SOLN
10.0000 mg | Freq: Four times a day (QID) | INTRAMUSCULAR | Status: DC | PRN
  Administered 2015-10-29 – 2015-10-30 (×3): 10 mg via INTRAVENOUS
  Filled 2015-10-29 (×3): qty 1

## 2015-10-29 MED ORDER — VANCOMYCIN HCL 10 G IV SOLR
1500.0000 mg | INTRAVENOUS | Status: DC
  Administered 2015-10-30 – 2015-10-31 (×2): 1500 mg via INTRAVENOUS
  Filled 2015-10-29 (×2): qty 1500

## 2015-10-29 NOTE — Progress Notes (Signed)
[facility] PROGRESS NOTE  OSWALD JUSINO [id number] DOB: Aug 16, 1925 DOA: 10/28/2015 PCP: [name], MD  Assessment/Plan: 1. Community acquired pneumonia.  -Mr.Esbenshade is a pleasant 81 year old gentleman with a history of coronary artery disease, chronic steroids for autoimmune hepatitis, presenting with symptoms consistent with pneumonia.  He had a chest x-ray performed in the emergency department that showed consolidation involving posterior in a right upper lobe. -Having history of immunosuppression he was started on broad-spectrum IV antimicrobial therapy.  Plan to monitor him on chronic IV antibiotics for the next 24 hours, follow-up on blood cultures, and will consider narrowing and microbial regimen and a.m. if he continues to improve. -Lab work, showing downward trend as lactate.  4.3-2.1  2.  Acute respiratory failure -Evidenced by a respiratory rate of 29 having a new oxygen requirement. -Likely secondary to community-acquired pneumonia -Continue empiric IV antibiotic coverage and provide supplemental oxygen. -On exam he had crackles with wheezing, will stop IV fluids for now.   3.  History of atrial fibrillation -CHADSVasc score of 3 -Will restart his metoprolol at 25 mg by mouth twice a day.  His Coumadin was held due to supratherapeutic INR of 4.37. -Pharmacy consultant for warfarin dosing.  4.  Hypertension. -Initial blood pressures low for which antihypertensive agents were held. -Overnight blood pressures elevated for which I will restart metoprolol 25 mg by mouth twice a day and Cozaar 100 mg by mouth daily  5.  Supratherapeutic INR -As mentioned above patient anticoagulated for atrial fibrillation.  -INR elevated at 4.37, and lab work.  Will continue holding Coumadin  6.  Venous stasis ulcer. -Patient having ulceration over the shin of right lower extremity.  This does not appear to be acutely infected. [facility] care has been consulted  7.  Autoimmune  hepatitis -Continue prednisone 10 mg by mouth daily  Code Status: Full Code Family Communication: I spoke with his wife over telephone  Disposition Plan: WIll continue close monitoring in SDU   Antibiotics:  Vancomycin  Aztreonam  Doxycycline  HPI/Subjective: Mr Cammon is a pleasant 81 year old gentleman with a past medical history of coronary artery disease, atrial fibrillation on chronic anticoagulation, autoimmune hepatitis on chronic symptoms, admitted to the medicine service on 10/28/2015 when he presented with complaints of cough associated with shortness of breath.  Initial workup in the emergency department included a chest x-ray that showedconsolidation involving posterior segment right upper lobe.  He was started on broad-spectrum IV antibiotic therapy with aztreonam and vancomycin for community acquired pneumonia.  Objective: Filed Vitals:   10/29/15 0550 10/29/15 0700  BP: 154/100 163/90  Pulse:  89  Temp:  97.5 F (36.4 C)  Resp:  22    Intake/Output Summary (Last 24 hours) at 10/29/15 0840 Last data filed at 10/29/15 0600  Gross per 24 hour  Intake   1600 ml  Output    675 ml  Net    925 ml   Filed Weights   10/28/15 1012  Weight: 88.451 kg (195 lb)    Exam:   General:  Patient is awake and alert, mildly confused/disoriented. Ill appearing  Cardiovascular: Irregular rate and rhythm, normal S1S2  Respiratory: Has bilateral wheezes, and basilar crackles. On supplemental oxygen   Abdomen: Soft nontender nondistended  Musculoskeletal: Left extremity 2+ pitting edema, right extremity 1+ pitting edema, there is ulcer over shin of right extremity, no evidence of infection  Data Reviewed: Basic Metabolic Panel:  Recent Labs Lab 10/25/15 0839 10/28/15 1101 10/29/15 0530  NA 140 137  140  K 5.4* 4.9 5.0  CL  --  102 111  CO2 22 27 23   GLUCOSE 128 98 151*  BUN 23.2 21* 20  CREATININE 1.4* 1.46* 1.18  CALCIUM 8.9 8.7* 7.8*   Liver Function  Tests:  Recent Labs Lab 10/25/15 0839 10/28/15 1101  AST 54* 62*  ALT 60* 57  ALKPHOS 117 105  BILITOT 0.93 1.4*  PROT 6.7 6.8  ALBUMIN 2.9* 3.1*   No results for input(s): LIPASE, AMYLASE in the last 168 hours. No results for input(s): AMMONIA in the last 168 hours. CBC:  Recent Labs Lab 10/25/15 0838 10/28/15 1101 10/29/15 0530  WBC 7.9 12.8* 12.7*  NEUTROABS 2.7  --   --   HGB 13.7 15.2 12.3*  HCT 42.4 46.8 38.2*  MCV 97.3 98.5 98.5  PLT 197 196 154   Cardiac Enzymes:  Recent Labs Lab 10/28/15 1101 10/28/15 1823 10/28/15 2302 10/29/15 0530  TROPONINI 0.20* 0.15* 0.11* 0.08*   BNP (last 3 results)  Recent Labs  10/28/15 1105  BNP 700.1*    ProBNP (last 3 results) No results for input(s): PROBNP in the last 8760 hours.  CBG: No results for input(s): GLUCAP in the last 168 hours.  Recent Results (from the past 240 hour(s))  MRSA PCR Screening     Status: None   Collection Time: 10/28/15  5:00 PM  Result Value Ref Range Status   MRSA by PCR NEGATIVE NEGATIVE Final    Comment:        The GeneXpert MRSA Assay (FDA approved for NASAL specimens only), is one component of a comprehensive MRSA colonization surveillance program. It is not intended to diagnose MRSA infection nor to guide or monitor treatment for MRSA infections.      Studies: Dg Chest 2 View  10/28/2015  CLINICAL DATA:  Shortness of breath with productive cough for 3 days EXAM: CHEST  2 VIEW COMPARISON:  June 07, 2015 FINDINGS: There is airspace consolidation in the posterior segment of the right upper lobe. There is scarring in the right lower lobe and to a lesser extent left base, stable. Heart is slightly enlarged with pulmonary vascularity within normal limits. There is atherosclerotic calcification in the aorta. No adenopathy apparent. There are surgical clips in left axilla. Bones are osteoporotic. IMPRESSION: Airspace consolidation consistent with pneumonia posterior segment  right upper lobe. Areas of scarring in both lower lung zones, more on the right than on the left, are stable. No change in cardiac silhouette. Bones osteoporotic. Followup PA and lateral chest radiographs recommended in 3-4 weeks following trial of antibiotic therapy to ensure resolution and exclude underlying malignancy. Electronically Signed   By: Lowella Grip III M.D.   On: 10/28/2015 10:41    Scheduled Meds: . aztreonam  2 g Intravenous 3 times per day  . hydrocortisone sod succinate (SOLU-CORTEF) inj  50 mg Intravenous Q8H  . [START ON 10/30/2015] Influenza vac split quadrivalent PF  0.5 mL Intramuscular Tomorrow-1000  . metoprolol tartrate  25 mg Oral BID  . vancomycin  1,250 mg Intravenous Q24H  . Warfarin - Pharmacist Dosing Inpatient   Does not apply q1800   Continuous Infusions:   Principal Problem:   CAP (community acquired pneumonia) Active Problems:   HLD (hyperlipidemia)   Essential hypertension   Coronary atherosclerosis   Atrial fibrillation (HCC)   Current use of long term anticoagulation   Chronic cutaneous venous stasis ulcer ([place])   Autoimmune hepatitis treated with steroids ([place])   Acute respiratory  failure (HCC)   Acute renal insufficiency   SIRS (systemic inflammatory response syndrome) ([place])    Time spent: 35 min    Kelvin Cellar  [facility] Pager [phone]. If 7PM-7AM, please contact night-coverage at [email or web address], password [facility] 10/29/2015, 8:40 AM  LOS: 1 day

## 2015-10-29 NOTE — Consult Note (Signed)
WOC wound consult note Reason for Consult: skin tear left elbow and right LE.  Patient on coumadin and reports a fall at home, not sure if he has fallen more often than once.  He reports most recently an injury that occurred while "winding the clock" and he hit the pretibial area of the RLE.   Thin frail skin with some ecchymosis over bilateral upper and lower extremities.  Wound type: trauma wounds, Left elbow partial thickness/ right LE full thickness Pressure Ulcer POA: No Measurement: RLE 2 areas 0.5cmx 0.5cm x 0.2cm and linear are 1.5cm long L elbow 2 areas: 4cm x 1.5cm x 0 (scabbed) and 1.0cm x 1.5cm x 0.1cm  Wound bed/drainage: wounds are clean with minimal drainage, not purulent.  Periwound: intact, the LE wound does have a surrounding small hematoma, but it is mostly flat with the skin.  Dressing procedure/placement/frequency: Silicone foam dressing to protect and insulate both RLE and LUE, change every 3 days.  Explained rationale for dressing and provided patient with one extra dressing to take with him a DC, notified patient he can obtain this particular dressing at [place] [facility] if he wishes and change 1x wk.  Discussed POC with patient and bedside nurse.  Re consult if needed, will not follow at this time. Thanks  Cy Bresee Kellogg, [place] [phone])

## 2015-10-29 NOTE — Progress Notes (Signed)
ANTICOAGULATION & ANTIBIOTIC CONSULT NOTE - Follow Up Consult  Pharmacy Consult:  Coumadin + Vancomycin/Azactam Indication: atrial fibrillation + PNA  Allergies  Allergen Reactions  . Penicillins Hives    Patient Measurements: Height: 6\' 2"  (188 cm) Weight: 195 lb (88.451 kg) IBW/kg (Calculated) : 82.2  Vital Signs: Temp: 97.5 F (36.4 C) (12/12 0700) Temp Source: Oral (12/12 0700) BP: 188/99 mmHg (12/12 0930) Pulse Rate: 94 (12/12 0930)  Labs:  Recent Labs  10/28/15 1101 10/28/15 1823 10/28/15 2302 10/29/15 0530  HGB 15.2  --   --  12.3*  HCT 46.8  --   --  38.2*  PLT 196  --   --  154  LABPROT 33.3*  --   --  40.6*  INR 3.36*  --   --  4.37*  CREATININE 1.46*  --   --  1.18  TROPONINI 0.20* 0.15* 0.11* 0.08*    Estimated Creatinine Clearance: 50.3 mL/min (by C-G formula based on Cr of 1.18).     Assessment: 45 YOM with history of Afib admitted with supra-therapeutic INR.  INR continues to trend up today despite Coumadin being on hold.  No bleeding reported.   Patient continues on vancomycin and aztreonam for PNA in setting of immunosuppression.  Patient's renal function is improving.  Vanc 12/11 >> Azactam 12/11 >>  12/11 BCx x2 - 12/11 RVAP -    Goal of Therapy:  INR 2-3  Vanc trough 15-20 mcg/mL    Plan:  - Continue to hold Coumadin - Daily PT / INR - Change vanc to 1500mg  IV Q24H, start tomorrow - Change azactam to 1gm IV Q8H - Monitor renal fxn, clinical progress, vanc trough as indicated    Titan Karner D. Mina Marble, PharmD, BCPS Pager:  [phone] 10/29/2015, 11:09 AM

## 2015-10-30 ENCOUNTER — Inpatient Hospital Stay (HOSPITAL_COMMUNITY): Payer: Medicare Other

## 2015-10-30 LAB — PROTIME-INR
INR: 3.85 — ABNORMAL HIGH (ref 0.00–1.49)
Prothrombin Time: 36.9 seconds — ABNORMAL HIGH (ref 11.6–15.2)

## 2015-10-30 LAB — BASIC METABOLIC PANEL
Anion gap: 7 (ref 5–15)
BUN: 24 mg/dL — ABNORMAL HIGH (ref 6–20)
CALCIUM: 8.5 mg/dL — AB (ref 8.9–10.3)
CHLORIDE: 109 mmol/L (ref 101–111)
CO2: 24 mmol/L (ref 22–32)
Creatinine, Ser: 1.04 mg/dL (ref 0.61–1.24)
Glucose, Bld: 90 mg/dL (ref 65–99)
Potassium: 4.2 mmol/L (ref 3.5–5.1)
SODIUM: 140 mmol/L (ref 135–145)

## 2015-10-30 LAB — ADENOVIRUS ANTIBODIES: Adenovirus Antibody: 1:8 {titer} — ABNORMAL HIGH

## 2015-10-30 LAB — LEGIONELLA ANTIGEN, URINE

## 2015-10-30 LAB — CBC
HCT: 42.7 % (ref 39.0–52.0)
HEMOGLOBIN: 13.5 g/dL (ref 13.0–17.0)
MCH: 31.3 pg (ref 26.0–34.0)
MCHC: 31.6 g/dL (ref 30.0–36.0)
MCV: 98.8 fL (ref 78.0–100.0)
PLATELETS: 184 10*3/uL (ref 150–400)
RBC: 4.32 MIL/uL (ref 4.22–5.81)
RDW: 16.2 % — ABNORMAL HIGH (ref 11.5–15.5)
WBC: 13.4 10*3/uL — AB (ref 4.0–10.5)

## 2015-10-30 MED ORDER — LEVALBUTEROL HCL 0.63 MG/3ML IN NEBU
0.6300 mg | INHALATION_SOLUTION | Freq: Four times a day (QID) | RESPIRATORY_TRACT | Status: DC
  Administered 2015-10-30 – 2015-10-31 (×4): 0.63 mg via RESPIRATORY_TRACT
  Filled 2015-10-30 (×5): qty 3

## 2015-10-30 MED ORDER — METHYLPREDNISOLONE SODIUM SUCC 40 MG IJ SOLR
40.0000 mg | Freq: Three times a day (TID) | INTRAMUSCULAR | Status: DC
  Administered 2015-10-30 – 2015-10-31 (×5): 40 mg via INTRAVENOUS
  Filled 2015-10-30 (×5): qty 1

## 2015-10-30 MED ORDER — METOPROLOL TARTRATE 50 MG PO TABS: 50.0000 mg | ORAL_TABLET | Freq: Two times a day (BID) | ORAL | Status: DC

## 2015-10-30 MED ORDER — METOPROLOL TARTRATE 50 MG PO TABS
50.0000 mg | ORAL_TABLET | Freq: Two times a day (BID) | ORAL | Status: DC
  Administered 2015-10-30 – 2015-10-31 (×3): 50 mg via ORAL
  Filled 2015-10-30 (×2): qty 1

## 2015-10-30 MED ORDER — METHYLPREDNISOLONE SODIUM SUCC 40 MG IJ SOLR: 40.0000 mg | Freq: Three times a day (TID) | INTRAMUSCULAR | Status: DC

## 2015-10-30 NOTE — Progress Notes (Signed)
ANTICOAGULATION CONSULT NOTE - Follow Up Consult  Pharmacy Consult:  Coumadin Indication: atrial fibrillation  Allergies  Allergen Reactions  . Penicillins Hives    Patient Measurements: Height: 6\' 2"  (188 cm) Weight: 195 lb (88.451 kg) IBW/kg (Calculated) : 82.2  Vital Signs: Temp: 97 F (36.1 C) (12/13 1100) Temp Source: Axillary (12/13 1100) BP: 107/67 mmHg (12/13 1100) Pulse Rate: 67 (12/13 1100)  Labs:  Recent Labs  10/28/15 1101 10/28/15 1823 10/28/15 2302 10/29/15 0530 10/30/15 0250  HGB 15.2  --   --  12.3* 13.5  HCT 46.8  --   --  38.2* 42.7  PLT 196  --   --  154 184  LABPROT 33.3*  --   --  40.6* 36.9*  INR 3.36*  --   --  4.37* 3.85*  CREATININE 1.46*  --   --  1.18 1.04  TROPONINI 0.20* 0.15* 0.11* 0.08*  --     Estimated Creatinine Clearance: 57.1 mL/min (by C-G formula based on Cr of 1.04).     Assessment: 10 YOM with history of Afib admitted with supra-therapeutic INR.  INR remains supra-therapeutic but on a downward trend.  No bleeding reported.   Goal of Therapy:  INR 2-3  Vanc trough 15-20 mcg/mL    Plan:  - Continue to hold Coumadin - Daily PT / INR - Vanc 1500mg  IV Q24H - Azactam 1gm IV Q8H - Monitor renal fxn, clinical progress, vanc trough as indicated    Deloise Marchant D. Mina Marble, PharmD, BCPS Pager:  [phone] 10/30/2015, 1:42 PM

## 2015-10-30 NOTE — Progress Notes (Signed)
[facility] PROGRESS NOTE  Arthur [name] [id number] DOB: 12-20-1924 DOA: 10/28/2015 PCP: [name], MD   Interim Summary Arthur [name] is a pleasant 81 year old gentleman with a past medical history of coronary artery disease, atrial fibrillation on chronic anticoagulation, autoimmune hepatitis on chronic symptoms, admitted to the medicine service on 10/28/2015 when he presented with complaints of cough associated with shortness of breath.  Initial workup in the emergency department included a chest x-ray that showedconsolidation involving posterior segment right upper lobe.  He was started on broad-spectrum IV antibiotic therapy with aztreonam and vancomycin for community acquired pneumonia.  Assessment/Plan: 1. Community acquired pneumonia.  -Arthur [name] is a pleasant 81 year old gentleman with a history of coronary artery disease, chronic steroids for autoimmune hepatitis, presenting with symptoms consistent with pneumonia.  He had a chest x-ray performed in the emergency department that showed consolidation involving posterior in a right upper lobe. -Having history of immunosuppression he was started on broad-spectrum IV antimicrobial therapy.  Plan to monitor him on chronic IV antibiotics for the next 24 hours, follow-up on blood cultures, and will consider narrowing and microbial regimen and a.m. if he continues to improve. -Lab work, showing downward trend as lactate.  4.3-2.1 -10/30/2015: Repeat 2 view chest x-ray performed on 10/30/2015 showing stable right upper lobe airspace opacity. White count slightly trending up to 13,400 from 12,700. Afebrile overnight. Blood cultures show no growth to date. Will continue broad-spectrum IV antimicrobial therapy.   2.  Acute respiratory failure -Evidenced by a respiratory rate of 29 having a new oxygen requirement. -Likely secondary to community-acquired pneumonia with possible underlying reactive airway disease component. -Continue  empiric IV antibiotic coverage and provide supplemental oxygen. -10/30/2015 He has worsening wheezing on this morning's exam, will start IV systemic steroids and scheduled nebs  3.  Probable reactive airway disease -Arthur [name] denies history of asthma however reports having issues with wheezing every time he get a "chest cold" -Wheezing more pronounced on today's exam for which SoluMedrol 40 mg IV TID was started.  -On scheduled Xopenex Nebs  4.  History of atrial fibrillation -CHADSVasc score of 3 -Continue metoprolol.  His Coumadin was held due to supratherapeutic INR of 4.37. -Pharmacy consultant for warfarin dosing.  4.  Hypertension. -Initial blood pressures low for which antihypertensive agents were held. -His blood pressures remain elevated. Will increase metoprolol to  50 mg by mouth twice a day and continue Cozaar at 100 mg by mouth daily  5.  Supratherapeutic INR -As mentioned above patient anticoagulated for atrial fibrillation.  -INR coming down to 3.85 from 4.37 -Coumadin held, pharmacy consulted for dosing  6.  Venous stasis ulcer. -Patient having ulceration over the shin of right lower extremity.  This does not appear to be acutely infected. -Wound care has been consulted  7.  Autoimmune hepatitis -On systemic steroids   Code Status: Full Code Family Communication: I spoke with his wife over telephone  Disposition Plan: Wll continue close monitoring in SDU   Antibiotics:  Vancomycin  Aztreonam  Doxycycline  HPI/Subjective: Reports having increased wheezing, had a rough night  Objective: Filed Vitals:   10/30/15 0732 10/30/15 0800  BP: 162/89 149/95  Pulse: 93 93  Temp: 97.9 F (36.6 C)   Resp: 24 32    Intake/Output Summary (Last 24 hours) at 10/30/15 0908 Last data filed at 10/30/15 0450  Gross per 24 hour  Intake    880 ml  Output      0 ml  Net  880 ml   Filed Weights   10/28/15 1012  Weight: 88.451 kg (195 lb)     Exam:   General:  Patient is awake and alert. Ill appearing, having dyspnea at rest with audible wheezing  Cardiovascular: Irregular rate and rhythm, normal S1S2, tachy  Respiratory: Increased bilateral wheezes and rhonchi On supplemental oxygen   Abdomen: Soft nontender nondistended  Musculoskeletal: Left extremity 2+ pitting edema, right extremity 1+ pitting edema, there is ulcer over shin of right extremity, no evidence of infection  Data Reviewed: Basic Metabolic Panel:  Recent Labs Lab 10/25/15 0839 10/28/15 1101 10/29/15 0530 10/30/15 0250  NA 140 137 140 140  K 5.4* 4.9 5.0 4.2  CL  --  102 111 109  CO2 22 27 23 24   GLUCOSE 128 98 151* 90  BUN 23.2 21* 20 24*  CREATININE 1.4* 1.46* 1.18 1.04  CALCIUM 8.9 8.7* 7.8* 8.5*   Liver Function Tests:  Recent Labs Lab 10/25/15 0839 10/28/15 1101  AST 54* 62*  ALT 60* 57  ALKPHOS 117 105  BILITOT 0.93 1.4*  PROT 6.7 6.8  ALBUMIN 2.9* 3.1*   No results for input(s): LIPASE, AMYLASE in the last 168 hours. No results for input(s): AMMONIA in the last 168 hours. CBC:  Recent Labs Lab 10/25/15 0838 10/28/15 1101 10/29/15 0530 10/30/15 0250  WBC 7.9 12.8* 12.7* 13.4*  NEUTROABS 2.7  --   --   --   HGB 13.7 15.2 12.3* 13.5  HCT 42.4 46.8 38.2* 42.7  MCV 97.3 98.5 98.5 98.8  PLT 197 196 154 184   Cardiac Enzymes:  Recent Labs Lab 10/28/15 1101 10/28/15 1823 10/28/15 2302 10/29/15 0530  TROPONINI 0.20* 0.15* 0.11* 0.08*   BNP (last 3 results)  Recent Labs  10/28/15 1105  BNP 700.1*    ProBNP (last 3 results) No results for input(s): PROBNP in the last 8760 hours.  CBG: No results for input(s): GLUCAP in the last 168 hours.  Recent Results (from the past 240 hour(s))  Blood culture (routine x 2)     Status: None (Preliminary result)   Collection Time: 10/28/15 11:15 AM  Result Value Ref Range Status   Specimen Description BLOOD RIGHT ANTECUBITAL  Final   Special Requests BOTTLES DRAWN  AEROBIC AND ANAEROBIC 10MLS  Final   Culture NO GROWTH 1 DAY  Final   Report Status PENDING  Incomplete  Blood culture (routine x 2)     Status: None (Preliminary result)   Collection Time: 10/28/15 11:30 AM  Result Value Ref Range Status   Specimen Description BLOOD RIGHT ARM  Final   Special Requests BOTTLES DRAWN AEROBIC AND ANAEROBIC 5MLS  Final   Culture NO GROWTH 1 DAY  Final   Report Status PENDING  Incomplete  MRSA PCR Screening     Status: None   Collection Time: 10/28/15  5:00 PM  Result Value Ref Range Status   MRSA by PCR NEGATIVE NEGATIVE Final    Comment:        The GeneXpert MRSA Assay (FDA approved for NASAL specimens only), is one component of a comprehensive MRSA colonization surveillance program. It is not intended to diagnose MRSA infection nor to guide or monitor treatment for MRSA infections.      Studies: Dg Chest 2 View  10/30/2015  CLINICAL DATA:  Community acquired pneumonia. EXAM: CHEST  2 VIEW COMPARISON:  October 28, 2015. FINDINGS: Stable cardiomegaly. No pneumothorax is noted. Mild bilateral pleural effusions are noted. Stable right upper lobe  airspace opacity is noted most consistent with pneumonia. Degenerative changes seen involving both glenohumeral joints. IMPRESSION: Mild bilateral pleural effusions. Stable right upper lobe airspace opacity consistent with pneumonia. Continued radiographic follow-up is recommended to ensure resolution and rule out underlying neoplasm. Electronically Signed   By: Marijo Conception, M.D.   On: 10/30/2015 07:55   Dg Chest 2 View  10/28/2015  CLINICAL DATA:  Shortness of breath with productive cough for 3 days EXAM: CHEST  2 VIEW COMPARISON:  June 07, 2015 FINDINGS: There is airspace consolidation in the posterior segment of the right upper lobe. There is scarring in the right lower lobe and to a lesser extent left base, stable. Heart is slightly enlarged with pulmonary vascularity within normal limits. There is  atherosclerotic calcification in the aorta. No adenopathy apparent. There are surgical clips in left axilla. Bones are osteoporotic. IMPRESSION: Airspace consolidation consistent with pneumonia posterior segment right upper lobe. Areas of scarring in both lower lung zones, more on the right than on the left, are stable. No change in cardiac silhouette. Bones osteoporotic. Followup PA and lateral chest radiographs recommended in 3-4 weeks following trial of antibiotic therapy to ensure resolution and exclude underlying malignancy. Electronically Signed   By: Lowella Grip III M.D.   On: 10/28/2015 10:41    Scheduled Meds: . aztreonam  1 g Intravenous 3 times per day  . Influenza vac split quadrivalent PF  0.5 mL Intramuscular Tomorrow-1000  . levalbuterol  0.63 mg Nebulization Q6H  . losartan  100 mg Oral Daily  . methylPREDNISolone (SOLU-MEDROL) injection  40 mg Intravenous 3 times per day  . metoprolol tartrate  50 mg Oral BID  . vancomycin  1,500 mg Intravenous Q24H  . Warfarin - Pharmacist Dosing Inpatient   Does not apply q1800   Continuous Infusions:   Principal Problem:   CAP (community acquired pneumonia) Active Problems:   HLD (hyperlipidemia)   Essential hypertension   Coronary atherosclerosis   Atrial fibrillation (HCC)   Current use of long term anticoagulation   Chronic cutaneous venous stasis ulcer ([place])   Autoimmune hepatitis treated with steroids ([place])   Acute respiratory failure (HCC)   Acute renal insufficiency   SIRS (systemic inflammatory response syndrome) ([place])    Time spent: 35 min    Kelvin Cellar  [facility] Pager [phone]. If 7PM-7AM, please contact night-coverage at [email or web address], password [facility] 10/30/2015, 9:08 AM  LOS: 2 days

## 2015-10-31 ENCOUNTER — Inpatient Hospital Stay (HOSPITAL_COMMUNITY): Payer: Medicare Other

## 2015-10-31 DIAGNOSIS — I10 Essential (primary) hypertension: Secondary | ICD-10-CM

## 2015-10-31 DIAGNOSIS — N183 Chronic kidney disease, stage 3 unspecified: Secondary | ICD-10-CM | POA: Diagnosis present

## 2015-10-31 DIAGNOSIS — K754 Autoimmune hepatitis: Secondary | ICD-10-CM

## 2015-10-31 DIAGNOSIS — I4891 Unspecified atrial fibrillation: Secondary | ICD-10-CM

## 2015-10-31 DIAGNOSIS — J9601 Acute respiratory failure with hypoxia: Secondary | ICD-10-CM

## 2015-10-31 DIAGNOSIS — J189 Pneumonia, unspecified organism: Principal | ICD-10-CM

## 2015-10-31 LAB — CBC
HEMATOCRIT: 40.4 % (ref 39.0–52.0)
Hemoglobin: 13.3 g/dL (ref 13.0–17.0)
MCH: 32.2 pg (ref 26.0–34.0)
MCHC: 32.9 g/dL (ref 30.0–36.0)
MCV: 97.8 fL (ref 78.0–100.0)
PLATELETS: 191 10*3/uL (ref 150–400)
RBC: 4.13 MIL/uL — ABNORMAL LOW (ref 4.22–5.81)
RDW: 16.3 % — AB (ref 11.5–15.5)
WBC: 9.3 10*3/uL (ref 4.0–10.5)

## 2015-10-31 LAB — BASIC METABOLIC PANEL
ANION GAP: 6 (ref 5–15)
BUN: 38 mg/dL — ABNORMAL HIGH (ref 6–20)
CALCIUM: 8.7 mg/dL — AB (ref 8.9–10.3)
CO2: 22 mmol/L (ref 22–32)
CREATININE: 1.25 mg/dL — AB (ref 0.61–1.24)
Chloride: 113 mmol/L — ABNORMAL HIGH (ref 101–111)
GFR, EST AFRICAN AMERICAN: 57 mL/min — AB (ref >60)
GFR, EST NON AFRICAN AMERICAN: 50 mL/min — AB (ref >60)
Glucose, Bld: 136 mg/dL — ABNORMAL HIGH (ref 65–99)
Potassium: 4.5 mmol/L (ref 3.5–5.1)
Sodium: 141 mmol/L (ref 135–145)

## 2015-10-31 LAB — PROTIME-INR
INR: 2.69 — AB (ref 0.00–1.49)
PROTHROMBIN TIME: 28.2 s — AB (ref 11.6–15.2)

## 2015-10-31 MED ORDER — WARFARIN SODIUM 5 MG PO TABS: 5.0000 mg | ORAL_TABLET | Freq: Once | ORAL | Status: DC

## 2015-10-31 MED ORDER — LEVOFLOXACIN 500 MG PO TABS: 500.0000 mg | ORAL_TABLET | Freq: Every day | ORAL | Status: DC

## 2015-10-31 MED ORDER — ALBUTEROL SULFATE HFA 108 (90 BASE) MCG/ACT IN AERS: 2.0000 | INHALATION_SPRAY | Freq: Four times a day (QID) | RESPIRATORY_TRACT | Status: DC | PRN

## 2015-10-31 MED ORDER — FUROSEMIDE 10 MG/ML IJ SOLN
40.0000 mg | Freq: Once | INTRAMUSCULAR | Status: AC
  Administered 2015-10-31: 40 mg via INTRAVENOUS
  Filled 2015-10-31: qty 4

## 2015-10-31 MED ORDER — PREDNISONE 10 MG PO TABS: 10.0000 mg | ORAL_TABLET | Freq: Every day | ORAL | Status: DC

## 2015-10-31 NOTE — Discharge Summary (Signed)
Physician Discharge Summary  Arthur [name] [id number] DOB: 28-Jan-1925 DOA: 10/28/2015  PCP: [name], MD  Admit date: 10/28/2015 Discharge date: 10/31/2015  Time spent: > 30 minutes  Recommendations for Outpatient Follow-up:  1. Follow up with Dr. Linna Darner in 1-2 weeks 2. INR check in 2 days at Coumadin clinic   Discharge Diagnoses:  Principal Problem:   CAP (community acquired pneumonia) Active Problems:   HLD (hyperlipidemia)   Essential hypertension   Coronary atherosclerosis   Atrial fibrillation (HCC)   Current use of long term anticoagulation   Chronic cutaneous venous stasis ulcer (HCC)   Autoimmune hepatitis treated with steroids (HCC)   Acute respiratory failure (HCC)   Acute renal insufficiency   SIRS (systemic inflammatory response syndrome) (HCC)   CKD (chronic kidney disease) stage 3, GFR 30-59 ml/min   Discharge Condition: stable  Diet recommendation: regular  Filed Weights   10/28/15 1012  Weight: 88.451 kg (195 lb)    History of present illness:  See H&P, Labs, Consult and Test reports for all details in brief, patient is a pleasant 81 year old gentleman with a past medical history of coronary artery disease, atrial fibrillation on chronic anticoagulation, autoimmune hepatitis on chronic symptoms, admitted to the medicine service on 10/28/2015 when he presented with complaints of cough associated with shortness of breath. Initial workup in the emergency department included a chest x-ray that showedconsolidation involving posterior segment right upper lobe. He was started on broad-spectrum IV antibiotic therapy with aztreonam and vancomycin for community acquired pneumonia.  Hospital Course:  Community acquired pneumonia. -Mr.Lyu is a pleasant 81 year old gentleman with a history of coronary artery disease, chronic steroids for autoimmune hepatitis, presenting with symptoms consistent with pneumonia. He had a chest x-ray performed in the  emergency department that showed consolidation involving posterior in a right upper lobe. Having history of immunosuppression he was started on broad-spectrum IV antimicrobial therapy. He improved clinically with improvement in his dyspnea, able to ambulate on room air. His antibiotics were narrowed to Levaquin and he is to take additional 3 days to complete a 7 day course. He was afebrile and his leukocytosis resolved.  Acute respiratory failure with hypoxia - Evidenced by a respiratory rate of 29 having a new oxygen requirement, with treatment he was able to be weaned off on room air on discharge. He had mild wheezing suggesting reactive airway disease and was started on steroids and will be on a prednisone taper on discharge Probable reactive airway disease - Mr Wadler denies history of asthma however reports having issues with wheezing every time he get a "chest cold". Prednisone taper as above.  History of atrial fibrillation - CHADSVasc score of 3 - Continue metoprolol. His Coumadin was held due to supratherapeutic INR of 4.37, to resume given therapeutic INR currently. He is to follow with Coumadin clinic in 2 days.  Hypertension - resume home medications Venous stasis ulcer -P atient having ulceration over the shin of right lower extremity. This does not appear to be acutely infected. Autoimmune hepatitis - On systemic steroids  CKD III - baseline Cr 1.4 - 1.6. Cr better than baseline on d/c. Outpatient follow up.  Procedures:  None    Consultations:  None   Discharge Exam: Filed Vitals:   10/31/15 0906 10/31/15 0909 10/31/15 1308 10/31/15 1440  BP: 150/89  143/78   Pulse: 78  59   Temp:   97.8 F (36.6 C)   TempSrc:   Oral   Resp:   19  Height:      Weight:      SpO2:  94% 94% 97%    General: NAD Cardiovascular: RRR Respiratory: CTA biL, no wheezing, no crackles / rhonchi  Discharge Instructions Activity:  As tolerated   Get Medicines reviewed and  adjusted: Please take all your medications with you for your next visit with your Primary MD  Please request your Primary MD to go over all hospital tests and procedure/radiological results at the follow up, please ask your Primary MD to get all Hospital records sent to his/her office.  If you experience worsening of your admission symptoms, develop shortness of breath, life threatening emergency, suicidal or homicidal thoughts you must seek medical attention immediately by calling 911 or calling your MD immediately if symptoms less severe.  You must read complete instructions/literature along with all the possible adverse reactions/side effects for all the Medicines you take and that have been prescribed to you. Take any new Medicines after you have completely understood and accpet all the possible adverse reactions/side effects.   Do not drive when taking Pain medications.   Do not take more than prescribed Pain, Sleep and Anxiety Medications  Special Instructions: If you have smoked or chewed Tobacco in the last 2 yrs please stop smoking, stop any regular Alcohol and or any Recreational drug use.  Wear Seat belts while driving.  Please note  You were cared for by a hospitalist during your hospital stay. Once you are discharged, your primary care physician will handle any further medical issues. Please note that NO REFILLS for any discharge medications will be authorized once you are discharged, as it is imperative that you return to your primary care physician (or establish a relationship with a primary care physician if you do not have one) for your aftercare needs so that they can reassess your need for medications and monitor your lab values.    Medication List    TAKE these medications        albuterol 108 (90 BASE) MCG/ACT inhaler  Commonly known as:  PROVENTIL HFA;VENTOLIN HFA  Inhale 2 puffs into the lungs every 6 (six) hours as needed for wheezing or shortness of breath.      levofloxacin 500 MG tablet  Commonly known as:  LEVAQUIN  Take 1 tablet (500 mg total) by mouth daily.     losartan 50 MG tablet  Commonly known as:  COZAAR  TAKE 1 TABLET (50 MG TOTAL) BY MOUTH DAILY.     metoprolol tartrate 25 MG tablet  Commonly known as:  LOPRESSOR  Take 25 mg by mouth daily.     predniSONE 10 MG tablet  Commonly known as:  DELTASONE  Take 1 tablet (10 mg total) by mouth daily with breakfast. 40 mg for 2 days then 30 mg for 2 days then 20 mg for 2 days then return to 10 mg daily     warfarin 5 MG tablet  Commonly known as:  COUMADIN  TAKE AS DIRECTED BY COUMADIN CLINIC.       Follow-up Information    Follow up with [name], MD. Schedule an appointment as soon as possible for a visit in 1 week.   Specialty:  Internal Medicine   Contact information:   [address] [address] [phone]       The results of significant diagnostics from this hospitalization (including imaging, microbiology, ancillary and laboratory) are listed below for reference.    Significant Diagnostic Studies: Dg Chest 2  View  10/30/2015  CLINICAL DATA:  Community acquired pneumonia. EXAM: CHEST  2 VIEW COMPARISON:  October 28, 2015. FINDINGS: Stable cardiomegaly. No pneumothorax is noted. Mild bilateral pleural effusions are noted. Stable right upper lobe airspace opacity is noted most consistent with pneumonia. Degenerative changes seen involving both glenohumeral joints. IMPRESSION: Mild bilateral pleural effusions. Stable right upper lobe airspace opacity consistent with pneumonia. Continued radiographic follow-up is recommended to ensure resolution and rule out underlying neoplasm. Electronically Signed   By: Marijo Conception, M.D.   On: 10/30/2015 07:55   Dg Chest 2 View  10/28/2015  CLINICAL DATA:  Shortness of breath with productive cough for 3 days EXAM: CHEST  2 VIEW COMPARISON:  June 07, 2015 FINDINGS: There is airspace consolidation in the posterior  segment of the right upper lobe. There is scarring in the right lower lobe and to a lesser extent left base, stable. Heart is slightly enlarged with pulmonary vascularity within normal limits. There is atherosclerotic calcification in the aorta. No adenopathy apparent. There are surgical clips in left axilla. Bones are osteoporotic. IMPRESSION: Airspace consolidation consistent with pneumonia posterior segment right upper lobe. Areas of scarring in both lower lung zones, more on the right than on the left, are stable. No change in cardiac silhouette. Bones osteoporotic. Followup PA and lateral chest radiographs recommended in 3-4 weeks following trial of antibiotic therapy to ensure resolution and exclude underlying malignancy. Electronically Signed   By: Lowella Grip III M.D.   On: 10/28/2015 10:41   Dg Lumbar Spine Complete  10/04/2015  CLINICAL DATA:  [place] 4 days ago, LEFT side low back pain and spasms, no sciatica, initial encounter EXAM: LUMBAR SPINE - COMPLETE 4+ VIEW COMPARISON:  None; correlation CT abdomen and pelvis 09/21/2007 FINDINGS: Calcified gallstones RIGHT upper quadrant 2.0 cm greatest size. Osseous demineralization. Five non-rib-bearing lumbar vertebra. Thoracolumbar levoconvex scoliosis. Multilevel disc space narrowing and bulky endplate spur formation. Multilevel facet degenerative changes. Grade 1 anterolisthesis L5-S1 with degenerative disc and facet disease changes at this level. Suboptimal visualization of the pars interarticularis regions at L5, questionable pars defects noted. Vertebral body heights maintained without fracture or bone destruction. No additional subluxation. Extensive atherosclerotic calcifications. IMPRESSION: Osseous demineralization. Multilevel advanced degenerative disc and facet disease changes with associated levoconvex scoliosis and grade 1 spondylolisthesis L5-S1. Cannot exclude spondylolysis at L5. Electronically Signed   By: Lavonia Dana M.D.   On:  10/04/2015 08:15   Dg Elbow Complete Right  10/04/2015  CLINICAL DATA:  Acute right elbow pain after fall 4 days ago. EXAM: RIGHT ELBOW - COMPLETE 3+ VIEW COMPARISON:  None. FINDINGS: There is no evidence of fracture, dislocation, or joint effusion. There is no evidence of arthropathy or other focal bone abnormality. Soft tissues are unremarkable. IMPRESSION: Normal right elbow. Electronically Signed   By: Marijo Conception, M.D.   On: 10/04/2015 08:13   Dg Chest Port 1 View  10/31/2015  CLINICAL DATA:  Shortness of breath. EXAM: PORTABLE CHEST 1 VIEW COMPARISON:  10/30/2015 FINDINGS: Lordotic technique demonstrated. Lungs are adequately inflated with persistent opacification over the right perihilar region with possible slight improved aeration likely ongoing pneumonia. Subtle opacification over the medial right base and left retrocardiac region likely layering small effusions unchanged. Stable moderate cardiomegaly. Moderate calcified plaque over the aortic arch. Remainder of the exam is unchanged. IMPRESSION: Persistent airspace process over the right perihilar region with slight interval improved aeration likely ongoing pneumonia. Likely persistent small layering effusions. Stable moderate cardiomegaly. Electronically Signed  By: Marin Olp M.D.   On: 10/31/2015 08:15   Dg Hand Complete Right  10/04/2015  CLINICAL DATA:  Fall with hand pain 4 days EXAM: RIGHT HAND - COMPLETE 3+ VIEW COMPARISON:  None. FINDINGS: Negative for fracture Degenerative change in the wrist joint with widening of the scapholunate space compatible with ligament injury. This could be acute but more likely chronic given the joint space narrowing. Degenerative change in the second and third MCP. Degenerative change in the PIP and DIP joints. Mild degenerative angulation of the third and fourth PIP joint. Moderate degenerative change in the fifth DIP joint. Diffuse arterial calcification. IMPRESSION: Negative for fracture.  Scapholunate ligament disruption, likely chronic but correlate with history. There is moderate joint space narrowing in the radiocarpal joint suggesting chronic injury and cartilage loss Osteoarthritis throughout the hand. Electronically Signed   By: Franchot Gallo M.D.   On: 10/04/2015 08:14    Microbiology: Recent Results (from the past 240 hour(s))  Blood culture (routine x 2)     Status: None (Preliminary result)   Collection Time: 10/28/15 11:15 AM  Result Value Ref Range Status   Specimen Description BLOOD RIGHT ANTECUBITAL  Final   Special Requests BOTTLES DRAWN AEROBIC AND ANAEROBIC 10MLS  Final   Culture NO GROWTH 3 DAYS  Final   Report Status PENDING  Incomplete  Blood culture (routine x 2)     Status: None (Preliminary result)   Collection Time: 10/28/15 11:30 AM  Result Value Ref Range Status   Specimen Description BLOOD RIGHT ARM  Final   Special Requests BOTTLES DRAWN AEROBIC AND ANAEROBIC 5MLS  Final   Culture NO GROWTH 3 DAYS  Final   Report Status PENDING  Incomplete  MRSA PCR Screening     Status: None   Collection Time: 10/28/15  5:00 PM  Result Value Ref Range Status   MRSA by PCR NEGATIVE NEGATIVE Final    Comment:        The GeneXpert MRSA Assay (FDA approved for NASAL specimens only), is one component of a comprehensive MRSA colonization surveillance program. It is not intended to diagnose MRSA infection nor to guide or monitor treatment for MRSA infections.      Labs: Basic Metabolic Panel:  Recent Labs Lab 10/25/15 0839 10/28/15 1101 10/29/15 0530 10/30/15 0250 10/31/15 0754  NA 140 137 140 140 141  K 5.4* 4.9 5.0 4.2 4.5  CL  --  102 111 109 113*  CO2 22 27 23 24 22   GLUCOSE 128 98 151* 90 136*  BUN 23.2 21* 20 24* 38*  CREATININE 1.4* 1.46* 1.18 1.04 1.25*  CALCIUM 8.9 8.7* 7.8* 8.5* 8.7*   Liver Function Tests:  Recent Labs Lab 10/25/15 0839 10/28/15 1101  AST 54* 62*  ALT 60* 57  ALKPHOS 117 105  BILITOT 0.93 1.4*  PROT 6.7  6.8  ALBUMIN 2.9* 3.1*   CBC:  Recent Labs Lab 10/25/15 0838 10/28/15 1101 10/29/15 0530 10/30/15 0250 10/31/15 0754  WBC 7.9 12.8* 12.7* 13.4* 9.3  NEUTROABS 2.7  --   --   --   --   HGB 13.7 15.2 12.3* 13.5 13.3  HCT 42.4 46.8 38.2* 42.7 40.4  MCV 97.3 98.5 98.5 98.8 97.8  PLT 197 196 154 184 191   Cardiac Enzymes:  Recent Labs Lab 10/28/15 1101 10/28/15 1823 10/28/15 2302 10/29/15 0530  TROPONINI 0.20* 0.15* 0.11* 0.08*   BNP: BNP (last 3 results)  Recent Labs  10/28/15 1105  BNP 700.1*  SignedMarzetta Board  [facility] 10/31/2015, 6:11 PM

## 2015-10-31 NOTE — Care Management Note (Signed)
Case Management Note  Patient Details  Name: Arthur [name] MRN: [id number] Date of Birth: 1925-02-06  Subjective/Objective:    PNA                Action/Plan: Chart reviewed. NCM spoke to pt. No NCM needs identified. Pt can afford his medications. Has cane and RW at home.   Expected Discharge Date:  10/31/2015             Expected Discharge Plan:  Home/Self Care  In-House Referral:  NA  Discharge planning Services  CM Consult  Post Acute Care Choice:  NA Choice offered to:  NA  DME Arranged:  N/A DME Agency:  NA  HH Arranged:  NA HH Agency:  NA  Status of Service:  Completed, signed off  Medicare Important Message Given:  Yes Date Medicare IM Given:    Medicare IM give by:    Date Additional Medicare IM Given:    Additional Medicare Important Message give by:     If discussed at [place] of Stay Meetings, dates discussed:    Additional Comments:  Erenest Rasher, RN 10/31/2015, 4:10 PM

## 2015-10-31 NOTE — Progress Notes (Signed)
ANTICOAGULATION CONSULT NOTE - Follow Up Consult  Pharmacy Consult:  Coumadin Indication: atrial fibrillation  Allergies  Allergen Reactions  . Penicillins Hives    Patient Measurements: Height: 6\' 2"  (188 cm) Weight: 195 lb (88.451 kg) IBW/kg (Calculated) : 82.2  Vital Signs: Temp: 99.1 F (37.3 C) (12/14 0800) Temp Source: Oral (12/14 0800) BP: 150/89 mmHg (12/14 0906) Pulse Rate: 78 (12/14 0906)  Labs:  Recent Labs  10/28/15 1823 10/28/15 2302  10/29/15 0530 10/30/15 0250 10/31/15 0500 10/31/15 0754  HGB  --   --   < > 12.3* 13.5  --  13.3  HCT  --   --   --  38.2* 42.7  --  40.4  PLT  --   --   --  154 184  --  191  LABPROT  --   --   --  40.6* 36.9* 28.2*  --   INR  --   --   --  4.37* 3.85* 2.69*  --   CREATININE  --   --   --  1.18 1.04  --  1.25*  TROPONINI 0.15* 0.11*  --  0.08*  --   --   --   < > = values in this interval not displayed.  Estimated Creatinine Clearance: 47.5 mL/min (by C-G formula based on Cr of 1.25).  Assessment: 60 YOM with history of Afib admitted with supra-therapeutic INR.  INR has trended down into therapeutic range after holding dose x2 days. Patient reported inadequate intake prior to admission which may have accounted for elevated INR. Patient also received received Levaquin and Doxycycline on day 1 of admission. Current intake is good/100% as charted. Based on rate of decrease in INR - ok to resume. No bleeding reported.   Home regimen: 5mg  daily except 2.5mg  on Tuesday, Friday, and Sunday. Last dose pta was 10/28/15.   Goal of Therapy:  INR 2-3   Plan:  - Resume Coumadin 5mg  po x1 today.  - Daily PT / INR  Sloan Leiter, PharmD, BCPS Clinical Pharmacist [phone] 10/31/2015, 11:56 AM

## 2015-10-31 NOTE — Care Management Important Message (Signed)
Important Message  Patient Details  Name: Arthur [name] MRN: [id number] Date of Birth: 1925-05-26   Medicare Important Message Given:  Yes    Hajer Dwyer Abena 10/31/2015, 11:12 AM

## 2015-10-31 NOTE — Discharge Instructions (Signed)
Follow with [name], MD in 5-7 days  FOLLOW up with INR clinic in 2 days to get your INR checked while on antibiotics   Please get a complete blood count and chemistry panel checked by your Primary MD at your next visit, and again as instructed by your Primary MD. Please get your medications reviewed and adjusted by your Primary MD.  Please request your Primary MD to go over all Hospital Tests and Procedure/Radiological results at the follow up, please get all Hospital records sent to your Prim MD by signing hospital release before you go home.  If you had Pneumonia of Lung problems at the Hospital: Please get a 2 view Chest X ray done in 6-8 weeks after hospital discharge or sooner if instructed by your Primary MD.  If you have Congestive Heart Failure: Please call your Cardiologist or Primary MD anytime you have any of the following symptoms:  1) 3 pound weight gain in 24 hours or 5 pounds in 1 week  2) shortness of breath, with or without a dry hacking cough  3) swelling in the hands, feet or stomach  4) if you have to sleep on extra pillows at night in order to breathe  Follow cardiac low salt diet and 1.5 lit/day fluid restriction.  If you have diabetes Accuchecks 4 times/day, Once in AM empty stomach and then before each meal. Log in all results and show them to your primary doctor at your next visit. If any glucose reading is under 80 or above 300 call your primary MD immediately.  If you have Seizure/Convulsions/Epilepsy: Please do not drive, operate heavy machinery, participate in activities at heights or participate in high speed sports until you have seen by Primary MD or a Neurologist and advised to do so again.  If you had Gastrointestinal Bleeding: Please ask your Primary MD to check a complete blood count within one week of discharge or at your next visit. Your endoscopic/colonoscopic biopsies that are pending at the time of discharge, will also need to followed by  your Primary MD.  Get Medicines reviewed and adjusted. Please take all your medications with you for your next visit with your Primary MD  Please request your Primary MD to go over all hospital tests and procedure/radiological results at the follow up, please ask your Primary MD to get all Hospital records sent to his/her office.  If you experience worsening of your admission symptoms, develop shortness of breath, life threatening emergency, suicidal or homicidal thoughts you must seek medical attention immediately by calling 911 or calling your MD immediately  if symptoms less severe.  You must read complete instructions/literature along with all the possible adverse reactions/side effects for all the Medicines you take and that have been prescribed to you. Take any new Medicines after you have completely understood and accpet all the possible adverse reactions/side effects.   Do not drive or operate heavy machinery when taking Pain medications.   Do not take more than prescribed Pain, Sleep and Anxiety Medications  Special Instructions: If you have smoked or chewed Tobacco  in the last 2 yrs please stop smoking, stop any regular Alcohol  and or any Recreational drug use.  Wear Seat belts while driving.  Please note You were cared for by a hospitalist during your hospital stay. If you have any questions about your discharge medications or the care you received while you were in the hospital after you are discharged, you can call the unit and asked to  speak with the hospitalist on call if the hospitalist that took care of you is not available. Once you are discharged, your primary care physician will handle any further medical issues. Please note that NO REFILLS for any discharge medications will be authorized once you are discharged, as it is imperative that you return to your primary care physician (or establish a relationship with a primary care physician if you do not have one) for your  aftercare needs so that they can reassess your need for medications and monitor your lab values.  You can reach the hospitalist office at phone [phone] or fax [phone]   If you do not have a primary care physician, you can call [phone] for a physician referral.  Activity: As tolerated with Full fall precautions use walker/cane & assistance as needed  Diet: regular  Disposition Home

## 2015-10-31 NOTE — Progress Notes (Signed)
D/c information discussed with pt and family. MD came to pt's room and also discussed discharge instructions. All questions answered and pt verbalized understanding. Pt is discharge home via wheelchair accompanied by his wife and daughter.

## 2015-11-01 LAB — RESPIRATORY VIRUS PANEL
Adenovirus: NEGATIVE
Influenza A: NEGATIVE
Influenza B: NEGATIVE
Metapneumovirus: NEGATIVE
Parainfluenza 1: NEGATIVE
Parainfluenza 2: POSITIVE — AB
Parainfluenza 3: NEGATIVE
Respiratory Syncytial Virus A: NEGATIVE
Respiratory Syncytial Virus B: NEGATIVE
Rhinovirus: NEGATIVE

## 2015-11-02 ENCOUNTER — Ambulatory Visit (INDEPENDENT_AMBULATORY_CARE_PROVIDER_SITE_OTHER): Payer: Medicare Other | Admitting: *Deleted

## 2015-11-02 ENCOUNTER — Telehealth: Payer: Self-pay | Admitting: *Deleted

## 2015-11-02 DIAGNOSIS — I4891 Unspecified atrial fibrillation: Secondary | ICD-10-CM

## 2015-11-02 LAB — CULTURE, BLOOD (ROUTINE X 2)
CULTURE: NO GROWTH
Culture: NO GROWTH

## 2015-11-02 LAB — POCT INR: INR: 1.7

## 2015-11-02 NOTE — Telephone Encounter (Signed)
Called pt to get him set-up for TCM appt no answer LMOM RTC.../lmb 

## 2015-11-05 ENCOUNTER — Telehealth: Payer: Self-pay

## 2015-11-05 NOTE — Telephone Encounter (Signed)
Pt return call back completed TCM call below.../lmb  Transition Care Management Follow-up Telephone Call   Date discharged? 10/31/15   How have you been since you were released from the hospital? Pt states he is doing a little better still have cough but not coughing up anything   Do you understand why you were in the hospital? YES   Do you understand the discharge instructions? YES   Where were you discharged to? Home   Items Reviewed:  Medications reviewed: YES  Allergies reviewed: YES  Dietary changes reviewed: NO  Referrals reviewed: No referral needed   Functional Questionnaire:   Activities of Daily Living (ADLs):   He states he are independent in the following: ambulation, bathing and hygiene, feeding, continence, grooming, toileting and dressing States he require assistance with the following: ambulation sometimes   Any transportation issues/concerns?: NO   Any patient concerns? NO   Confirmed importance and date/time of follow-up visits scheduled YES, pt called back made appt for 11/09/15 advised pt to make sure he keep appt  Provider Appointment booked with Dr. Linna Darner  Confirmed with patient if condition begins to worsen call PCP or go to the ER.  Patient was given the office number and encouraged to call back with question or concerns.  : YES

## 2015-11-05 NOTE — Telephone Encounter (Signed)
-----   Message from Algernon Huxley, RN sent at 09/04/2015 12:48 PM EDT ----- Regarding: LFT's Pt needs labs in 2 mth, order in epic.

## 2015-11-05 NOTE — Telephone Encounter (Signed)
Pt aware.

## 2015-11-08 ENCOUNTER — Ambulatory Visit (INDEPENDENT_AMBULATORY_CARE_PROVIDER_SITE_OTHER): Payer: Medicare Other | Admitting: *Deleted

## 2015-11-08 ENCOUNTER — Telehealth: Payer: Self-pay

## 2015-11-08 DIAGNOSIS — I4891 Unspecified atrial fibrillation: Secondary | ICD-10-CM

## 2015-11-08 LAB — POCT INR: INR: 2.6

## 2015-11-08 NOTE — Telephone Encounter (Signed)
Pt is due to have LFT's checked. States he was in the hospital recently and is currently on Prednisone, he had pneumonia. Pt wants to know if Dr. Henrene Pastor wants him to come for labs or wait since he has been on higher doses of prednisone. Please advise.

## 2015-11-09 ENCOUNTER — Ambulatory Visit (INDEPENDENT_AMBULATORY_CARE_PROVIDER_SITE_OTHER): Payer: Medicare Other | Admitting: Internal Medicine

## 2015-11-09 ENCOUNTER — Telehealth: Payer: Self-pay | Admitting: Internal Medicine

## 2015-11-09 VITALS — BP 118/72 | HR 82 | Temp 97.8°F | Resp 18 | Wt 206.0 lb

## 2015-11-09 DIAGNOSIS — L97822 Non-pressure chronic ulcer of other part of left lower leg with fat layer exposed: Secondary | ICD-10-CM

## 2015-11-09 DIAGNOSIS — L97802 Non-pressure chronic ulcer of other part of unspecified lower leg with fat layer exposed: Secondary | ICD-10-CM

## 2015-11-09 DIAGNOSIS — Z23 Encounter for immunization: Secondary | ICD-10-CM | POA: Diagnosis not present

## 2015-11-09 DIAGNOSIS — D492 Neoplasm of unspecified behavior of bone, soft tissue, and skin: Secondary | ICD-10-CM

## 2015-11-09 DIAGNOSIS — R600 Localized edema: Secondary | ICD-10-CM

## 2015-11-09 DIAGNOSIS — L989 Disorder of the skin and subcutaneous tissue, unspecified: Secondary | ICD-10-CM

## 2015-11-09 DIAGNOSIS — J189 Pneumonia, unspecified organism: Secondary | ICD-10-CM

## 2015-11-09 MED ORDER — FUROSEMIDE 20 MG PO TABS: 20.0000 mg | ORAL_TABLET | Freq: Every day | ORAL | Status: DC

## 2015-11-09 NOTE — Telephone Encounter (Signed)
Left message for pt to call back  °

## 2015-11-09 NOTE — Telephone Encounter (Signed)
Let him know that I reviewed his recent LFT's from hospital. They were better. Have him repeat LFT's after he has returned to baseline dose of prednisone

## 2015-11-09 NOTE — Telephone Encounter (Signed)
Pt aware.

## 2015-11-09 NOTE — Patient Instructions (Addendum)
Dip gauze in  sterile saline and applied to the wound twice a day. Cover the wound with Telfa , non stick dressing  without any antibiotic ointment. The saline can be purchased at the drugstore or you can make your own .Boil cup of salt in a gallon of water. Store mixture  in a clean container.Report Warning  signs as discussed (red streaks, pus, fever, increasing pain).  The Wound Care referral will be scheduled and you'll be notified of the time.Please call the Referral Co-Ordinator @ [phone] if you have not been notified of appointment time within 7-10 days.  Use  Aveeno Daily  Moisturizing Lotion  twice a day  for the paranasal lesion. See your Dermatologist within 6 weeks.

## 2015-11-09 NOTE — Telephone Encounter (Signed)
Pt was seen today and was told to get sterile saline and the pharmacy told them it needs to be prescribed [facility] on [address]

## 2015-11-09 NOTE — Progress Notes (Signed)
Pre visit review using our clinic review tool, if applicable. No additional management support is needed unless otherwise documented below in the visit note. 

## 2015-11-09 NOTE — Progress Notes (Signed)
   Subjective:    Patient ID: Arthur [name], male    DOB: 08/16/1925, 81 y.o.   MRN: [id number]  HPI  He was hospitalized 12/11-12/14/16 with community-acquired pneumonia. The presentation was as cough and shortness of breath. He was found to have a respiratory rate of 29; decreased O2 sats necessitating 02 supplementally; and wheezing. Right upper lobe consolidation was present on x-ray. He was started on dual therapy with aztreonam and vancomycin. He did have hemoptysis in the context of having an excessively high INR. This was addressed while hospitalized. He was weaned to [place] to complete a seven-day course along with a prednisone taper. As per Dr. Henrene Pastor he is on a maintenance dose of 10 mg qd for elevated LFTs  He bumped the left shin prior to admission. He was seen by the wound care team in the hospital and Allevyn dressing prescribed. He has continued to have oozing from the wound without associated purulence. He's had no constitutional symptoms.  Review of Systems  There is no significant cough, sputum production,hemoptysis, wheezing,or  paroxysmal nocturnal dyspnea @ present. Unexplained weight loss, abdominal pain, significant dyspepsia, dysphagia, melena, rectal bleeding, or persistently small caliber stools are not present. Dysuria, pyuria, hematuria, frequency, nocturia or polyuria are denied.    Objective:   Physical Exam  Pertinent or positive findings include: He has a 6 x 4 mm lesion at the right superior nasolabial fold suggestive of possible malignancy. He has a pending appointment with his Dermatologist. First heart sound is accentuated with slurring versus a brief systolic murmur. Pedal pulses are decreased. He has 1/2+ pitting edema. There is a teardrop shaped irregular ulcer over the right shin measuring 55 mm in height and 16 mm in width at its distal base.  General appearance :adequately nourished; in no distress.  Eyes: No conjunctival inflammation or scleral  icterus is present.  Oral exam:  Lips and gums are healthy appearing.There is no oropharyngeal erythema or exudate noted. No cyanosis.  Heart:   without gallop, click, rub or other extra sounds    Lungs:Chest clear to auscultation; no wheezes, rhonchi,rales ,or rubs present.No increased work of breathing.   Abdomen: bowel sounds normal, soft and non-tender without masses, organomegaly or hernias noted.  No guarding or rebound.   Vascular : all pulses equal ; no bruits present.  Skin:Warm & dry.  Intact without suspicious lesions or rashes ; no tenting or jaundice   Lymphatic: No lymphadenopathy is noted about the head, neck, axilla   Neuro: Strength, tone decreased.     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

## 2015-11-13 NOTE — Telephone Encounter (Signed)
Spoke with pt on 11/09/15 to instruct him on way to make Sterile saline at home per md.

## 2015-11-16 ENCOUNTER — Ambulatory Visit (INDEPENDENT_AMBULATORY_CARE_PROVIDER_SITE_OTHER): Payer: Medicare Other | Admitting: *Deleted

## 2015-11-16 DIAGNOSIS — I4891 Unspecified atrial fibrillation: Secondary | ICD-10-CM | POA: Diagnosis not present

## 2015-11-16 LAB — POCT INR: INR: 3

## 2015-11-21 ENCOUNTER — Encounter: Payer: Medicare Other | Attending: Surgery | Admitting: Surgery

## 2015-11-21 DIAGNOSIS — I872 Venous insufficiency (chronic) (peripheral): Secondary | ICD-10-CM | POA: Insufficient documentation

## 2015-11-21 DIAGNOSIS — I252 Old myocardial infarction: Secondary | ICD-10-CM | POA: Diagnosis not present

## 2015-11-21 DIAGNOSIS — L97212 Non-pressure chronic ulcer of right calf with fat layer exposed: Secondary | ICD-10-CM | POA: Insufficient documentation

## 2015-11-21 DIAGNOSIS — I129 Hypertensive chronic kidney disease with stage 1 through stage 4 chronic kidney disease, or unspecified chronic kidney disease: Secondary | ICD-10-CM | POA: Insufficient documentation

## 2015-11-21 DIAGNOSIS — Z86718 Personal history of other venous thrombosis and embolism: Secondary | ICD-10-CM | POA: Diagnosis not present

## 2015-11-21 DIAGNOSIS — Z880 Allergy status to penicillin: Secondary | ICD-10-CM | POA: Insufficient documentation

## 2015-11-21 DIAGNOSIS — Z7901 Long term (current) use of anticoagulants: Secondary | ICD-10-CM | POA: Insufficient documentation

## 2015-11-21 DIAGNOSIS — I2510 Atherosclerotic heart disease of native coronary artery without angina pectoris: Secondary | ICD-10-CM | POA: Insufficient documentation

## 2015-11-21 DIAGNOSIS — K754 Autoimmune hepatitis: Secondary | ICD-10-CM | POA: Insufficient documentation

## 2015-11-21 DIAGNOSIS — I83212 Varicose veins of right lower extremity with both ulcer of calf and inflammation: Secondary | ICD-10-CM | POA: Insufficient documentation

## 2015-11-21 DIAGNOSIS — N189 Chronic kidney disease, unspecified: Secondary | ICD-10-CM | POA: Diagnosis not present

## 2015-11-21 DIAGNOSIS — I482 Chronic atrial fibrillation: Secondary | ICD-10-CM | POA: Diagnosis not present

## 2015-11-21 DIAGNOSIS — Z87891 Personal history of nicotine dependence: Secondary | ICD-10-CM | POA: Diagnosis not present

## 2015-11-21 NOTE — Progress Notes (Addendum)
Arthur [name], Arthur [name] ([id number]) Visit Report for 11/21/2015 Allergy List Details Patient Name: Arthur [name], Arthur [name] Date of Service: 11/21/2015 1:30 PM Medical Record Number: [id number] Patient Account Number: [id number] Date of Birth/Sex: 1925-11-01 (82 y.o. Male) Treating RN: Baruch Gouty, RN, BSN, Velva Harman Primary Care Physician: [name] Other Clinician: Referring Physician: [name] Treating Physician/Extender: BURNS III, WALTER Weeks in Treatment: 0 Allergies Active Allergies penicillin Reaction: hives Allergy Notes Electronic Signature(s) Signed: 11/21/2015 12:42:51 PM By: Regan Lemming BSN, RN Entered By: Regan Lemming on 11/21/2015 12:42:51 Arthur [name] ([id number]) -------------------------------------------------------------------------------- [place] Details Patient Name: Arthur [name] Date of Service: 11/21/2015 1:30 PM Medical Record Number: [id number] Patient Account Number: [id number] Date of Birth/Sex: 11-22-1924 (82 y.o. Male) Treating RN: Baruch Gouty, RN, BSN, Velva Harman Primary Care Physician: [name] Other Clinician: Referring Physician: [name] Treating Physician/Extender: BURNS III, Charlean Sanfilippo in Treatment: 0 Visit Information Patient Arrived: Ambulatory Arrival Time: 13:25 Accompanied By: wife Transfer Assistance: None Patient Identification Verified: Yes Secondary Verification Process Yes Completed: Patient Requires Transmission- No Based Precautions: Patient Has Alerts: Yes Patient Alerts: Patient on Blood Thinner coumadin Electronic Signature(s) Signed: 11/21/2015 4:44:03 PM By: Regan Lemming BSN, RN Entered By: Regan Lemming on 11/21/2015 13:26:35 Arthur [name] ([id number]) -------------------------------------------------------------------------------- Clinic Level of Care Assessment Details Patient Name: Arthur [name] Date of Service: 11/21/2015 1:30 PM Medical Record Number: [id number] Patient Account Number: [id number] Date  of Birth/Sex: 04-23-1925 (82 y.o. Male) Treating RN: Cornell Barman Primary Care Physician: [name] Other Clinician: Referring Physician: [name] Treating Physician/Extender: BURNS III, Charlean Sanfilippo in Treatment: 0 Clinic Level of Care Assessment Items TOOL 1 Quantity Score []  - Use when EandM and Procedure is performed on INITIAL visit 0 ASSESSMENTS - Nursing Assessment / Reassessment X - General Physical Exam (combine w/ comprehensive assessment (listed just 1 20 below) when performed on new pt. evals) X - Comprehensive Assessment (HX, ROS, Risk Assessments, Wounds Hx, etc.) 1 25 ASSESSMENTS - Wound and Skin Assessment / Reassessment []  - Dermatologic / Skin Assessment (not related to wound area) 0 ASSESSMENTS - Ostomy and/or Continence Assessment and Care []  - Incontinence Assessment and Management 0 []  - Ostomy Care Assessment and Management (repouching, etc.) 0 PROCESS - Coordination of Care X - Simple Patient / Family Education for ongoing care 1 15 []  - Complex (extensive) Patient / Family Education for ongoing care 0 X - Staff obtains Consents, Records, Test Results / Process Orders 1 10 []  - Staff telephones HHA, Nursing Homes / Clarify orders / etc 0 []  - Routine Transfer to another Facility (non-emergent condition) 0 []  - Routine Hospital Admission (non-emergent condition) 0 X - New Admissions / Biomedical engineer / Ordering NPWT, Apligraf, etc. 1 15 []  - Emergency Hospital Admission (emergent condition) 0 PROCESS - Special Needs []  - Pediatric / Minor Patient Management 0 []  - Isolation Patient Management 0 Arthur [name], Arthur [name] ([id number]) []  - Hearing / Language / Visual special needs 0 []  - Assessment of Community assistance (transportation, D/C planning, etc.) 0 []  - Additional assistance / Altered mentation 0 []  - Support Surface(s) Assessment (bed, cushion, seat, etc.) 0 INTERVENTIONS - Miscellaneous []  - External ear exam 0 []  - Patient Transfer  (multiple staff / Civil Service fast streamer / Similar devices) 0 []  - Simple Staple / Suture removal (25 or less) 0 []  - Complex Staple / Suture removal (26 or more) 0 []  - Hypo/Hyperglycemic Management (do not check if billed separately) 0 X - Ankle / Brachial Index (ABI) - do not check if  billed separately 1 15 Has the patient been seen at the hospital within the last three years: Yes Total Score: 100 Level Of Care: New/Established - Level 3 Electronic Signature(s) Signed: 11/21/2015 5:28:49 PM By: Gretta Cool, RN, BSN, Kim RN, BSN Entered By: Gretta Cool, RN, BSN, Kim on 11/21/2015 14:12:40 Arthur [name] ([id number]) -------------------------------------------------------------------------------- Encounter Discharge Information Details Patient Name: Arthur [name] Date of Service: 11/21/2015 1:30 PM Medical Record Number: [id number] Patient Account Number: [id number] Date of Birth/Sex: 1924/12/07 (82 y.o. Male) Treating RN: Baruch Gouty, RN, BSN, Velva Harman Primary Care Physician: [name] Other Clinician: Referring Physician: [name] Treating Physician/Extender: BURNS III, Charlean Sanfilippo in Treatment: 0 Encounter Discharge Information Items Discharge Pain Level: 0 Discharge Condition: Stable Ambulatory Status: Ambulatory Discharge Destination: Home Transportation: Private Auto Accompanied By: wife Schedule Follow-up Appointment: No Medication Reconciliation completed and provided to Patient/Care No Naleah Kofoed: Provided on Clinical Summary of Care: 11/21/2015 Form Type Recipient Paper Patient [facility] Electronic Signature(s) Signed: 11/21/2015 2:25:09 PM By: Ruthine Dose Entered By: Ruthine Dose on 11/21/2015 14:25:09 Arthur [name] ([id number]) -------------------------------------------------------------------------------- Lower Extremity Assessment Details Patient Name: Arthur [name] Date of Service: 11/21/2015 1:30 PM Medical Record Number: [id number] Patient Account Number: [id number] Date of  Birth/Sex: February 04, 1925 (82 y.o. Male) Treating RN: Baruch Gouty, RN, BSN, [place] Primary Care Physician: [name] Other Clinician: Referring Physician: [name] Treating Physician/Extender: BURNS III, WALTER Weeks in Treatment: 0 Edema Assessment Assessed: [Left: No] [Right: No] Edema: [Left: Yes] [Right: Yes] Calf Left: Right: Point of Measurement: 34 cm From Medial Instep 38.5 cm 37.5 cm Ankle Left: Right: Point of Measurement: 9 cm From Medial Instep 28.5 cm 28.2 cm Vascular Assessment Claudication: Claudication Assessment [Left:None] [Right:None] Pulses: Posterior Tibial Palpable: [Left:No] [Right:No] Dorsalis Pedis Palpable: [Left:Yes] [Right:Yes] Extremity colors, hair growth, and conditions: Extremity Color: [Left:Mottled] [Right:Mottled] Hair Growth on Extremity: [Left:Yes] [Right:Yes] Temperature of Extremity: [Left:Warm] [Right:Warm] Capillary Refill: [Left:< 3 seconds] [Right:< 3 seconds] Blood Pressure: Brachial: [Left:108] Dorsalis Pedis: 130 [Left:Dorsalis Pedis:] Ankle: Posterior Tibial: [Left:Posterior Tibial: 1.20] Toe Nail Assessment Left: Right: Thick: Yes Yes Discolored: Yes Yes Deformed: No No Improper Length and Hygiene: Yes Yes Arthur [name], Arthur [name] ([id number]) Notes Non compressible on the right side. Electronic Signature(s) Signed: 11/21/2015 4:44:03 PM By: Regan Lemming BSN, RN Entered By: Regan Lemming on 11/21/2015 13:55:19 Arthur [name] ([id number]) -------------------------------------------------------------------------------- Multi Wound Chart Details Patient Name: Arthur [name] Date of Service: 11/21/2015 1:30 PM Medical Record Number: [id number] Patient Account Number: [id number] Date of Birth/Sex: [birth year] (82 y.o. Male) Treating RN: Cornell Barman Primary Care Physician: [name] Other Clinician: Referring Physician: [name] Treating Physician/Extender: BURNS III, WALTER Weeks in Treatment: 0 Vital  Signs Height(in): 74 Pulse(bpm): 50 Weight(lbs): 204 Blood Pressure 108/58 (mmHg): Body Mass Index(BMI): 26 Temperature(F): 97.5 Respiratory Rate 20 (breaths/min): Photos: [1:No Photos] [N/A:N/A] Wound Location: [1:Right Lower Leg - Anterior N/A] Wounding Event: [1:Trauma] [N/A:N/A] Primary Etiology: [1:Venous Leg Ulcer] [N/A:N/A] Comorbid History: [1:Anemia, Arrhythmia, Coronary Artery Disease, Deep Vein Thrombosis, Hypertension, Myocardial Infarction, Osteomyelitis] [N/A:N/A] Date Acquired: [1:11/12/2015] [N/A:N/A] Weeks of Treatment: [1:0] [N/A:N/A] Wound Status: [1:Open] [N/A:N/A] Measurements L x W x D 5.4x2.3x0.2 [N/A:N/A] (cm) Area (cm) : [1:9.755] [N/A:N/A] Volume (cm) : [1:1.951] [N/A:N/A] % Reduction in Area: [1:0.00%] [N/A:N/A] % Reduction in Volume: 0.00% [N/A:N/A] Classification: [1:Full Thickness Without Exposed Support Structures] [N/A:N/A] Exudate Amount: [1:Medium] [N/A:N/A] Exudate Type: [1:Serosanguineous] [N/A:N/A] Exudate Color: [1:red, brown] [N/A:N/A] Wound Margin: [1:Distinct, outline attached N/A] Granulation Amount: [1:Small (1-33%)] [N/A:N/A] Granulation Quality: [1:Pink] [N/A:N/A] Necrotic Amount: [1:Large (67-100%)] [N/A:N/A] Exposed Structures: [1:Fascia: No Fat:  No] [N/A:N/A] Tendon: No Muscle: No Joint: No Bone: No Limited to Skin Breakdown Epithelialization: None N/A N/A Periwound Skin Texture: Edema: Yes N/A N/A Excoriation: No Induration: No Callus: No Crepitus: No Fluctuance: No Friable: No Rash: No Scarring: No Periwound Skin Moist: Yes N/A N/A Moisture: Maceration: No Dry/Scaly: No Periwound Skin Color: Atrophie Blanche: No N/A N/A Cyanosis: No Ecchymosis: No Erythema: No Hemosiderin Staining: No Mottled: No Pallor: No Rubor: No Temperature: No Abnormality N/A N/A Tenderness on No N/A N/A Palpation: Wound Preparation: Ulcer Cleansing: N/A N/A Rinsed/Irrigated with Saline Topical Anesthetic Applied:  Other: lidocaine 4% Treatment Notes Electronic Signature(s) Signed: 11/21/2015 5:28:49 PM By: Gretta Cool, RN, BSN, Kim RN, BSN Entered By: Gretta Cool, RN, BSN, Kim on 11/21/2015 14:07:03 Arthur [name] ([id number]) -------------------------------------------------------------------------------- [place] Details Patient Name: Arthur [name] Date of Service: 11/21/2015 1:30 PM Medical Record Number: [id number] Patient Account Number: [id number] Date of Birth/Sex: Aug 31, 1925 (82 y.o. Male) Treating RN: Cornell Barman Primary Care Physician: [name] Other Clinician: Referring Physician: [name] Treating Physician/Extender: BURNS III, Charlean Sanfilippo in Treatment: 0 Active Inactive Orientation to the Wound Care Program Nursing Diagnoses: Knowledge deficit related to the wound healing center program Goals: Patient/caregiver will verbalize understanding of the [place] Program Date Initiated: 11/21/2015 Goal Status: Active Interventions: Provide education on orientation to the wound center Notes: Venous Leg Ulcer Nursing Diagnoses: Actual venous Insuffiency (use after diagnosis is confirmed) Goals: Patient will maintain optimal edema control Date Initiated: 11/21/2015 Goal Status: Active Interventions: Assess peripheral edema status every visit. Notes: Wound/Skin Impairment Nursing Diagnoses: Knowledge deficit related to ulceration/compromised skin integrity Goals: Ulcer/skin breakdown will heal within 14 weeks Date Initiated: 11/21/2015 Arthur [name] ([id number]) Goal Status: Active Interventions: Assess ulceration(s) every visit Notes: Electronic Signature(s) Signed: 11/21/2015 5:28:49 PM By: Gretta Cool, RN, BSN, Kim RN, BSN Entered By: Gretta Cool, RN, BSN, Kim on 11/21/2015 14:06:45 Arthur [name] ([id number]) -------------------------------------------------------------------------------- Pain Assessment Details Patient Name: Arthur [name] Date of Service: 11/21/2015 1:30 PM Medical Record Number: [id number] Patient Account Number: [id number] Date of Birth/Sex: September 09, 1925 (82 y.o. Male) Treating RN: Baruch Gouty, RN, BSN, Velva Harman Primary Care Physician: [name] Other Clinician: Referring Physician: [name] Treating Physician/Extender: BURNS III, Charlean Sanfilippo in Treatment: 0 Active Problems Location of Pain Severity and Description of Pain Patient Has Paino No Site Locations Pain Management and Medication Current Pain Management: Electronic Signature(s) Signed: 11/21/2015 4:44:03 PM By: Regan Lemming BSN, RN Entered By: Regan Lemming on 11/21/2015 13:28:41 Arthur [name] ([id number]) -------------------------------------------------------------------------------- Patient/Caregiver Education Details Patient Name: Arthur [name] Date of Service: 11/21/2015 1:30 PM Medical Record Number: [id number] Patient Account Number: [id number] Date of Birth/Gender: 1924/11/22 (82 y.o. Male) Treating RN: Baruch Gouty, RN, BSN, Velva Harman Primary Care Physician: [name] Other Clinician: Referring Physician: [name] Treating Physician/Extender: BURNS III, Charlean Sanfilippo in Treatment: 0 Education Assessment Education Provided To: Patient Education Topics Provided Welcome To [place]: Methods: Explain/Verbal Responses: State content correctly Electronic Signature(s) Signed: 11/21/2015 4:44:03 PM By: Regan Lemming BSN, RN Entered By: Regan Lemming on 11/21/2015 14:26:46 Arthur [name] ([id number]) -------------------------------------------------------------------------------- Wound Assessment Details Patient Name: Arthur [name] Date of Service: 11/21/2015 1:30 PM Medical Record Number: [id number] Patient Account Number: [id number] Date of Birth/Sex: [birth year] (82 y.o. Male) Treating RN: Baruch Gouty, RN, BSN, Velva Harman Primary Care Physician: [name] Other Clinician: Referring Physician: [name] Treating  Physician/Extender: BURNS III, WALTER Weeks in Treatment: 0 Wound Status Wound Number: 1 Primary Venous Leg Ulcer Etiology: Wound Location: Right Lower Leg -  Anterior Wound Open Wounding Event: Trauma Status: Date Acquired: 11/12/2015 Comorbid Anemia, Arrhythmia, Coronary Artery Weeks Of Treatment: 0 History: Disease, Deep Vein Thrombosis, Clustered Wound: No Hypertension, Myocardial Infarction, Osteomyelitis Photos Photo Uploaded By: Regan Lemming on 11/21/2015 16:25:46 Wound Measurements Length: (cm) 5.4 Width: (cm) 2.3 Depth: (cm) 0.2 Area: (cm) 9.755 Volume: (cm) 1.951 % Reduction in Area: 0% % Reduction in Volume: 0% Epithelialization: None Tunneling: No Undermining: No Wound Description Full Thickness Without Exposed Classification: Support Structures Wound Margin: Distinct, outline attached Exudate Medium Amount: Exudate Type: Serosanguineous Exudate Color: red, brown Foul Odor After Cleansing: No Wound Bed Granulation Amount: Small (1-33%) Exposed Structure Arthur [name], Arthur [name] ([id number]) Granulation Quality: Pink Fascia Exposed: No Necrotic Amount: Large (67-100%) Fat Layer Exposed: No Necrotic Quality: Adherent Slough Tendon Exposed: No Muscle Exposed: No Joint Exposed: No Bone Exposed: No Limited to Skin Breakdown Periwound Skin Texture Texture Color No Abnormalities Noted: No No Abnormalities Noted: No Callus: No Atrophie Blanche: No Crepitus: No Cyanosis: No Excoriation: No Ecchymosis: No Fluctuance: No Erythema: No Friable: No Hemosiderin Staining: No Induration: No Mottled: No Localized Edema: Yes Pallor: No Rash: No Rubor: No Scarring: No Temperature / Pain Moisture Temperature: No Abnormality No Abnormalities Noted: No Dry / Scaly: No Maceration: No Moist: Yes Wound Preparation Ulcer Cleansing: Rinsed/Irrigated with Saline Topical Anesthetic Applied: Other: lidocaine 4%, Treatment Notes Wound #1 (Right, Anterior  Lower Leg) 1. Cleansed with: Clean wound with Normal Saline 4. Dressing Applied: Aquacel Ag 5. Secondary Dressing Applied Bordered Foam Dressing 6. Footwear/Offloading device applied Tubigrip Electronic Signature(s) Signed: 11/21/2015 4:44:03 PM By: Regan Lemming BSN, RN Entered By: Regan Lemming on 11/21/2015 13:40:35 Arthur [name] ([id number]) -------------------------------------------------------------------------------- Vitals Details Patient Name: Arthur [name] Date of Service: 11/21/2015 1:30 PM Medical Record Number: [id number] Patient Account Number: [id number] Date of Birth/Sex: 10-16-1925 (82 y.o. Male) Treating RN: Baruch Gouty, RN, BSN, Velva Harman Primary Care Physician: [name] Other Clinician: Referring Physician: [name] Treating Physician/Extender: BURNS III, WALTER Weeks in Treatment: 0 Vital Signs Time Taken: 13:30 Temperature (F): 97.5 Height (in): 74 Pulse (bpm): 50 Source: Stated Respiratory Rate (breaths/min): 20 Weight (lbs): 204 Blood Pressure (mmHg): 108/58 Body Mass Index (BMI): 26.2 Reference Range: 80 - 120 mg / dl Electronic Signature(s) Signed: 11/21/2015 4:44:03 PM By: Regan Lemming BSN, RN Entered By: Regan Lemming on 11/21/2015 13:31:13

## 2015-11-22 NOTE — Progress Notes (Signed)
[name], [name] ([id number]) Visit Report for 11/21/2015 Abuse/Suicide Risk Screen Details Patient Name: Arthur [name], Arthur [name] Date of Service: 11/21/2015 1:30 PM Medical Record Number: [id number] Patient Account Number: [id number] Date of Birth/Sex: Dec 13, 1924 (82 y.o. Male) Treating RN: Baruch Gouty, RN, BSN, [place] Primary Care Physician: [name] Other Clinician: Referring Physician: [name] Treating Physician/Extender: BURNS III, WALTER Weeks in Treatment: 0 Abuse/Suicide Risk Screen Items Answer ABUSE/SUICIDE RISK SCREEN: Has anyone close to you tried to hurt or harm you recentlyo No Do you feel uncomfortable with anyone in your familyo No Has anyone forced you do things that you didnot want to doo No Do you have any thoughts of harming yourselfo No Patient displays signs or symptoms of abuse and/or neglect. No Electronic Signature(s) Signed: 11/21/2015 4:44:03 PM By: Regan Lemming BSN, RN Entered By: Regan Lemming on 11/21/2015 13:28:25 Arthur [name] ([id number]) -------------------------------------------------------------------------------- Activities of Daily Living Details Patient Name: Arthur [name] Date of Service: 11/21/2015 1:30 PM Medical Record Number: [id number] Patient Account Number: [id number] Date of Birth/Sex: 1925/04/09 (82 y.o. Male) Treating RN: Baruch Gouty, RN, BSN, [place] Primary Care Physician: [name] Other Clinician: Referring Physician: [name] Treating Physician/Extender: BURNS III, WALTER Weeks in Treatment: 0 Activities of Daily Living Items Answer Activities of Daily Living (Please select one for each item) Drive Automobile Completely Able Take Medications Completely Able Use Telephone Completely Able Care for Appearance Completely Able Use Toilet Completely Able Bath / Shower Completely Able Dress Self Completely Able Feed Self Completely Able Walk Completely Able Get In / Out Bed Completely Able Housework Completely Able Prepare  Meals Completely [place] for Self Completely Able Electronic Signature(s) Signed: 11/21/2015 4:44:03 PM By: Regan Lemming BSN, RN Entered By: Regan Lemming on 11/21/2015 13:28:03 Arthur [name] ([id number]) -------------------------------------------------------------------------------- Education Assessment Details Patient Name: Arthur [name] Date of Service: 11/21/2015 1:30 PM Medical Record Number: [id number] Patient Account Number: [id number] Date of Birth/Sex: [birth year] (82 y.o. Male) Treating RN: Baruch Gouty, RN, BSN, Velva Harman Primary Care Physician: [name] Other Clinician: Referring Physician: [name] Treating Physician/Extender: BURNS III, Charlean Sanfilippo in Treatment: 0 Primary Learner Assessed: Patient Learning Preferences/Education Level/Primary Language Learning Preference: Explanation Highest Education Level: Grade School Preferred Language: English Cognitive Barrier Assessment/Beliefs Language Barrier: No Physical Barrier Assessment Impaired Vision: No Impaired Hearing: No Decreased Hand dexterity: No Knowledge/Comprehension Assessment Knowledge Level: High Comprehension Level: High Ability to understand written High instructions: Ability to understand verbal High instructions: Motivation Assessment Anxiety Level: Calm Cooperation: Cooperative Education Importance: Acknowledges Need Interest in Health Problems: Asks Questions Perception: Coherent Willingness to Engage in Self- High Management Activities: Readiness to Engage in Self- High Management Activities: Electronic Signature(s) Signed: 11/21/2015 4:44:03 PM By: Regan Lemming BSN, RN Entered By: Regan Lemming on 11/21/2015 13:27:40 Arthur [name] ([id number]) -------------------------------------------------------------------------------- Fall Risk Assessment Details Patient Name: Arthur [name] Date of Service: 11/21/2015 1:30 PM Medical Record Number:  [id number] Patient Account Number: [id number] Date of Birth/Sex: 1925/10/13 (82 y.o. Male) Treating RN: Baruch Gouty, RN, BSN, Velva Harman Primary Care Physician: [name] Other Clinician: Referring Physician: [name] Treating Physician/Extender: BURNS III, Charlean Sanfilippo in Treatment: 0 Fall Risk Assessment Items Have you had 2 or more falls in the last 12 monthso 0 No Have you had any fall that resulted in injury in the last 12 monthso 0 No FALL RISK ASSESSMENT: History of falling - immediate or within 3 months 0 No Secondary diagnosis 0 No Ambulatory aid None/bed rest/wheelchair/nurse 0 Yes Crutches/cane/walker 0 No Furniture 0 No IV  Access/Saline Lock 0 No Gait/Training Normal/bed rest/immobile 0 Yes Weak 0 No Impaired 0 No Mental Status Oriented to own ability 0 Yes Electronic Signature(s) Signed: 11/21/2015 4:44:03 PM By: Regan Lemming BSN, RN Entered By: Regan Lemming on 11/21/2015 13:27:18 Arthur [name] ([id number]) -------------------------------------------------------------------------------- Foot Assessment Details Patient Name: Arthur [name] Date of Service: 11/21/2015 1:30 PM Medical Record Number: [id number] Patient Account Number: [id number] Date of Birth/Sex: Nov 15, 1925 (82 y.o. Male) Treating RN: Baruch Gouty, RN, BSN, [place] Primary Care Physician: [name] Other Clinician: Referring Physician: [name] Treating Physician/Extender: BURNS III, WALTER Weeks in Treatment: 0 Foot Assessment Items Site Locations + = Sensation present, - = Sensation absent, C = Callus, U = Ulcer R = Redness, W = Warmth, M = Maceration, PU = Pre-ulcerative lesion F = Fissure, S = Swelling, D = Dryness Assessment Right: Left: Other Deformity: No No Prior Foot Ulcer: No No Prior Amputation: No No Charcot Joint: No No Ambulatory Status: Ambulatory Without Help Gait: Steady Electronic Signature(s) Signed: 11/21/2015 4:44:03 PM By: Regan Lemming BSN, RN Entered By: Regan Lemming  on 11/21/2015 13:26:50 Arthur [name] ([id number]) -------------------------------------------------------------------------------- Nutrition Risk Assessment Details Patient Name: Arthur [name] Date of Service: 11/21/2015 1:30 PM Medical Record Number: [id number] Patient Account Number: [id number] Date of Birth/Sex: 15-Apr-1925 (82 y.o. Male) Treating RN: Baruch Gouty, RN, BSN, Velva Harman Primary Care Physician: [name] Other Clinician: Referring Physician: [name] Treating Physician/Extender: BURNS III, WALTER Weeks in Treatment: 0 Height (in): Weight (lbs): Body Mass Index (BMI): Nutrition Risk Assessment Items NUTRITION RISK SCREEN: I have an illness or condition that made me change the kind and/or 0 No amount of food I eat I eat fewer than two meals per day 0 No I eat few fruits and vegetables, or milk products 0 No I have three or more drinks of beer, liquor or wine almost every day 0 No I have tooth or mouth problems that make it hard for me to eat 0 No I don't always have enough money to buy the food I need 0 No I eat alone most of the time 0 No I take three or more different prescribed or over-the-counter drugs a 0 No day Without wanting to, I have lost or gained 10 pounds in the last six 0 No months I am not always physically able to shop, cook and/or feed myself 0 No Nutrition Protocols Good Risk Protocol 0 No interventions needed Moderate Risk Protocol Electronic Signature(s) Signed: 11/21/2015 4:44:03 PM By: Regan Lemming BSN, RN Entered By: Regan Lemming on 11/21/2015 13:27:03

## 2015-11-22 NOTE — Progress Notes (Signed)
Arthur, [name] ([id number]) Visit Report for 11/21/2015 Chief Complaint Document Details Patient Name: Arthur [name], Arthur [name] Date of Service: 11/21/2015 1:30 PM Medical Record Number: [id number] Patient Account Number: [id number] Date of Birth/Sex: 1925/01/29 (82 y.o. Male) Treating RN: Primary Care Physician: [name] Other Clinician: Referring Physician: [name] Treating Physician/Extender: BURNS III, Charlean Sanfilippo in Treatment: 0 Information Obtained from: Patient Chief Complaint Right anterior calf ulcer since December 2016. Electronic Signature(s) Signed: 11/22/2015 8:51:38 AM By: Loletha Grayer MD Entered By: Loletha Grayer on 11/21/2015 14:24:01 Arthur [name] ([id number]) -------------------------------------------------------------------------------- Debridement Details Patient Name: Arthur [name] Date of Service: 11/21/2015 1:30 PM Medical Record Number: [id number] Patient Account Number: [id number] Date of Birth/Sex: [birth year] (82 y.o. Male) Treating RN: Primary Care Physician: [name] Other Clinician: Referring Physician: [name] Treating Physician/Extender: BURNS III, Charlean Sanfilippo in Treatment: 0 Debridement Performed for Wound #1 Right,Anterior Lower Leg Assessment: Performed By: Physician BURNS III, Teressa Senter., MD Debridement: Debridement Pre-procedure Yes Verification/Time Out Taken: Start Time: 14:05 Pain Control: Other : lidocaine 4% Level: Skin/Subcutaneous Tissue Total Area Debrided (L x 5.4 (cm) x 2.3 (cm) = 12.42 (cm) W): Tissue and other Viable, Non-Viable, Blood Clots, Exudate, Fat, Fibrin/Slough, Subcutaneous material debrided: Instrument: Curette Bleeding: Minimum Hemostasis Achieved: Pressure End Time: 14:09 Procedural Pain: 0 Post Procedural Pain: 0 Response to Treatment: Procedure was tolerated well Post Debridement Measurements of Total Wound Length: (cm) 5.4 Width: (cm) 2.3 Depth: (cm) 0.3 Volume:  (cm) 2.926 Post Procedure Diagnosis Same as Pre-procedure Electronic Signature(s) Signed: 11/22/2015 8:51:38 AM By: Loletha Grayer MD Entered By: Loletha Grayer on 11/21/2015 14:23:45 Arthur [name] ([id number]) -------------------------------------------------------------------------------- HPI Details Patient Name: Arthur [name] Date of Service: 11/21/2015 1:30 PM Medical Record Number: [id number] Patient Account Number: [id number] Date of Birth/Sex: 06-05-1925 (82 y.o. Male) Treating RN: Primary Care Physician: [name] Other Clinician: Referring Physician: [name] Treating Physician/Extender: BURNS III, Dwyne Hasegawa Weeks in Treatment: 0 History of Present Illness HPI Description: Pleasant 82 year old with history of chronic venous insufficiency (status post endovenous ablation on the left), A. fib (on Coumadin), autoimmune hepatitis (on steroids), chronic kidney disease, and coronary artery disease. No history of diabetes. No known history of PAD. Status post right second toe amputation in 2014. Has a history of venous stasis ulcerations on the left, which healed with compression. Usually wears compression stockings, 15-20 mmHg. He traumatized his right anterior calf on a chair in mid December 2016. Subsequently hospitalized for pneumonia. Completed antibiotics. Performing dressing changes with gauze. Ambulating per his baseline. No claudication or rest pain. Right ABI noncompressible. Left ABI 1.2. No fever or chills. Moderate drainage. Baseline edema. Electronic Signature(s) Signed: 11/22/2015 8:51:38 AM By: Loletha Grayer MD Entered By: Loletha Grayer on 11/21/2015 14:29:14 Arthur [name] ([id number]) -------------------------------------------------------------------------------- Physical Exam Details Patient Name: Arthur [name] Date of Service: 11/21/2015 1:30 PM Medical Record Number: [id number] Patient Account Number: [id number] Date of  Birth/Sex: [birth year] (82 y.o. Male) Treating RN: Primary Care Physician: [name] Other Clinician: Referring Physician: [name] Treating Physician/Extender: BURNS III, Shantese Raven Weeks in Treatment: 0 Constitutional . Pulse regular. Respirations normal and unlabored. Afebrile. Marland Kitchen Respiratory WNL. No retractions.. Cardiovascular . Integumentary (Hair, Skin) .Marland Kitchen Neurological Sensation normal to touch, pin,and vibration. Psychiatric Judgement and insight Intact.. Oriented times 3.. No evidence of depression, anxiety, or agitation.. Notes Right anterior calf ulceration. Full-thickness. No exposed deep structures. No significant cellulitis. 2+ pitting edema. No palpable pedal pulses. Dopplerable DP and PT, both biphasic. ABI noncompressible. Left  ABI 1.2. Electronic Signature(s) Signed: 11/22/2015 8:51:38 AM By: Loletha Grayer MD Entered By: Loletha Grayer on 11/21/2015 14:30:12 Arthur [name] ([id number]) -------------------------------------------------------------------------------- Physician Orders Details Patient Name: Arthur [name] Date of Service: 11/21/2015 1:30 PM Medical Record Number: [id number] Patient Account Number: [id number] Date of Birth/Sex: 21-May-1925 (82 y.o. Male) Treating RN: Cornell Barman Primary Care Physician: [name] Other Clinician: Referring Physician: [name] Treating Physician/Extender: BURNS III, Charlean Sanfilippo in Treatment: 0 Verbal / Phone Orders: Yes Clinician: Cornell Barman Read Back and Verified: Yes Diagnosis Coding Wound Cleansing Wound #1 Right,Anterior Lower Leg o Clean wound with Normal Saline. Anesthetic Wound #1 Right,Anterior Lower Leg o Topical Lidocaine 4% cream applied to wound bed prior to debridement Primary Wound Dressing Wound #1 Right,Anterior Lower Leg o Aquacel Ag Secondary Dressing Wound #1 Right,Anterior Lower Leg o Boardered Foam Dressing Dressing Change Frequency Wound #1  Right,Anterior Lower Leg o Change dressing every other day. Follow-up Appointments Wound #1 Right,Anterior Lower Leg o Return Appointment in 1 week. Edema Control Wound #1 Right,Anterior Lower Leg o Patient to wear own compression stockings o Elevate legs to the level of the heart and pump ankles as often as possible o Tubigrip Additional Orders / Instructions Wound #1 Right,Anterior Lower Leg o Increase protein intake. o Activity as tolerated KALIX, SCHEY ([id number]) Electronic Signature(s) Signed: 11/21/2015 5:28:49 PM By: Gretta Cool RN, BSN, Kim RN, BSN Signed: 11/22/2015 8:51:38 AM By: Loletha Grayer MD Entered By: Gretta Cool RN, BSN, Kim on 11/21/2015 14:11:49 Arthur [name] ([id number]) -------------------------------------------------------------------------------- Problem List Details Patient Name: Arthur [name] Date of Service: 11/21/2015 1:30 PM Medical Record Number: [id number] Patient Account Number: [id number] Date of Birth/Sex: 12/25/1924 (82 y.o. Male) Treating RN: Primary Care Physician: [name] Other Clinician: Referring Physician: [name] Treating Physician/Extender: BURNS III, Charlean Sanfilippo in Treatment: 0 Active Problems ICD-10 Encounter Code Description Active Date Diagnosis I83.212 Varicose veins of right lower extremity with both ulcer of 11/21/2015 Yes calf and inflammation L97.212 Non-pressure chronic ulcer of right calf with fat layer 11/21/2015 Yes exposed I48.2 Chronic atrial fibrillation 11/21/2015 Yes I87.2 Venous insufficiency (chronic) (peripheral) 11/21/2015 Yes Inactive Problems Resolved Problems Electronic Signature(s) Signed: 11/22/2015 8:51:38 AM By: Loletha Grayer MD Entered By: Loletha Grayer on 11/21/2015 14:23:16 Arthur [name] ([id number]) -------------------------------------------------------------------------------- Progress Note/History and Physical Details Patient Name: Arthur [name] Date  of Service: 11/21/2015 1:30 PM Medical Record Number: [id number] Patient Account Number: [id number] Date of Birth/Sex: [birth year] (82 y.o. Male) Treating RN: Primary Care Physician: [name] Other Clinician: Referring Physician: [name] Treating Physician/Extender: BURNS III, Charlean Sanfilippo in Treatment: 0 Subjective Chief Complaint Information obtained from Patient Right anterior calf ulcer since December 2016. History of Present Illness (HPI) Pleasant 82 year old with history of chronic venous insufficiency (status post endovenous ablation on the left), A. fib (on Coumadin), autoimmune hepatitis (on steroids), chronic kidney disease, and coronary artery disease. No history of diabetes. No known history of PAD. Status post right second toe amputation in 2014. Has a history of venous stasis ulcerations on the left, which healed with compression. Usually wears compression stockings, 15-20 mmHg. He traumatized his right anterior calf on a chair in mid December 2016. Subsequently hospitalized for pneumonia. Completed antibiotics. Performing dressing changes with gauze. Ambulating per his baseline. No claudication or rest pain. Right ABI noncompressible. Left ABI 1.2. No fever or chills. Moderate drainage. Baseline edema. Wound History Patient presents with 1 open wound that has been present for approximately 3weeks. Patient has been treating  wound in the following manner: dry dressing. Laboratory tests have not been performed in the last month. Patient reportedly has not tested positive for an antibiotic resistant organism. Patient reportedly has not tested positive for osteomyelitis. Patient reportedly has not had testing performed to evaluate circulation in the legs. Patient experiences the following problems associated with their wounds: swelling. Patient History Information obtained from Patient. Allergies penicillin (Reaction: hives) Family History Cancer - Child, Heart  Disease - Mother, No family history of Diabetes, Hereditary Spherocytosis, Hypertension, Kidney Disease, Lung Disease, Seizures, Stroke, Thyroid Problems, Tuberculosis. Social History Former smoker, Marital Status - Married, Alcohol Use - Never, Drug Use - No History, Caffeine Use - Daily. DRAYDON, KANO ([id number]) Medical History Eyes Patient has history of Cataracts - removed Denies history of Glaucoma, Optic Neuritis Ear/Nose/Mouth/Throat Denies history of Chronic sinus problems/congestion, Middle ear problems Hematologic/Lymphatic Patient has history of Anemia Respiratory Denies history of Aspiration, Asthma, Chronic Obstructive Pulmonary Disease (COPD), Pneumothorax, Sleep Apnea, Tuberculosis Cardiovascular Patient has history of Arrhythmia, Coronary Artery Disease, Deep Vein Thrombosis, Hypertension, Myocardial Infarction Gastrointestinal Denies history of Cirrhosis , Colitis, Crohn s, Hepatitis A, Hepatitis B, Hepatitis C Endocrine Denies history of Type I Diabetes, Type II Diabetes Immunological Denies history of Lupus Erythematosus, Raynaud s, Scleroderma Integumentary (Skin) Denies history of History of Burn, History of pressure wounds Musculoskeletal Patient has history of Osteomyelitis - right foot Neurologic Denies history of Dementia, Neuropathy, Quadriplegia, Paraplegia, Seizure Disorder Psychiatric Denies history of Anorexia/bulimia, Confinement Anxiety Medical And Surgical History Notes Respiratory Acute respiratory failure Cardiovascular AAA, HLD Genitourinary Chronic kidney disease, Acute renal failure Oncologic left forearm melanoma excised years ago Review of Systems (ROS) Constitutional Symptoms (General Health) The patient has no complaints or symptoms. Eyes The patient has no complaints or symptoms. Ear/Nose/Mouth/Throat The patient has no complaints or symptoms. Hematologic/Lymphatic Complains or has symptoms of Bleeding / Clotting  Disorders - on chronic anticoagulants. Respiratory The patient has no complaints or symptoms. AFRAZ, HULA ([id number]) Cardiovascular Complains or has symptoms of LE edema. Endocrine The patient has no complaints or symptoms. Genitourinary The patient has no complaints or symptoms. Immunological The patient has no complaints or symptoms. Integumentary (Skin) Complains or has symptoms of Wounds, Breakdown, Swelling. Neurologic The patient has no complaints or symptoms. Objective Constitutional Pulse regular. Respirations normal and unlabored. Afebrile. Vitals Time Taken: 1:30 PM, Height: 74 in, Source: Stated, Weight: 204 lbs, BMI: 26.2, Temperature: 97.5  F, Pulse: 50 bpm, Respiratory Rate: 20 breaths/min, Blood Pressure: 108/58 mmHg. Respiratory WNL. No retractions.. Neurological Sensation normal to touch, pin,and vibration. Psychiatric Judgement and insight Intact.. Oriented times 3.. No evidence of depression, anxiety, or agitation.. General Notes: Right anterior calf ulceration. Full-thickness. No exposed deep structures. No significant cellulitis. 2+ pitting edema. No palpable pedal pulses. Dopplerable DP and PT, both biphasic. ABI noncompressible. Left ABI 1.2. Integumentary (Hair, Skin) Wound #1 status is Open. Original cause of wound was Trauma. The wound is located on the Right,Anterior Lower Leg. The wound measures 5.4cm length x 2.3cm width x 0.2cm depth; 9.755cm^2 area and 1.951cm^3 volume. The wound is limited to skin breakdown. There is no tunneling or undermining noted. There is a medium amount of serosanguineous drainage noted. The wound margin is distinct with the outline attached to the wound base. There is small (1-33%) pink granulation within the wound bed. There is a large (67-100%) amount of necrotic tissue within the wound bed including Adherent Slough. The KERMITT, MARCELLO. ([id number]) periwound skin appearance exhibited: Localized Edema, Moist.  The periwound skin appearance did not exhibit: Callus, Crepitus, Excoriation, Fluctuance, Friable, Induration, Rash, Scarring, Dry/Scaly, Maceration, Atrophie Blanche, Cyanosis, Ecchymosis, Hemosiderin Staining, Mottled, Pallor, Rubor, Erythema. Periwound temperature was noted as No Abnormality. 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 Chronic venous insufficiency. Procedures Wound #1 Wound #1 is a Venous Leg Ulcer located on the Right,Anterior Lower Leg . There was a Skin/Subcutaneous Tissue Debridement [id number]) debridement with total area of 12.42 sq cm performed by BURNS III, Teressa Senter., MD. with the following instrument(s): Curette to remove Viable and Non-Viable tissue/material including Blood Clots, Exudate, Fat, Fibrin/Slough, and Subcutaneous after achieving pain control using Other (lidocaine 4%). A time out was conducted prior to the start of the procedure. A Minimum amount of bleeding was controlled with Pressure. The procedure was tolerated well with a pain level of 0 throughout and a pain level of 0 following the procedure. Post Debridement Measurements: 5.4cm length x 2.3cm width x 0.3cm depth; 2.926cm^3 volume. Post procedure Diagnosis Wound #1: Same as Pre-Procedure Plan Wound Cleansing: Wound #1 Right,Anterior Lower Leg: Clean wound with Normal Saline. ROWIN, GUNNELL ([id number]) Anesthetic: Wound #1 Right,Anterior Lower Leg: Topical Lidocaine 4% cream applied to wound bed prior to debridement Primary Wound Dressing: Wound #1 Right,Anterior Lower Leg: Aquacel Ag Secondary Dressing: Wound #1 Right,Anterior Lower Leg: Boardered Foam Dressing Dressing Change Frequency: Wound #1  Right,Anterior Lower Leg: Change dressing every other day. Follow-up Appointments: Wound #1 Right,Anterior Lower Leg: Return Appointment in 1 week. Edema Control: Wound #1 Right,Anterior Lower Leg: Patient to wear own compression stockings Elevate legs to the level of the heart and pump ankles as often as possible Tubigrip Additional Orders / Instructions: Wound #1 Right,Anterior Lower Leg: Increase protein intake. Activity as tolerated Silver alginate dressing changes daily to every other day. Tubigrip for edema control on the right. If no significant improvement we'll consider compression bandage. Compression stocking on the left. Encouraged frequent leg elevation and ambulation. Electronic Signature(s) Signed: 11/22/2015 8:51:38 AM By: Loletha Grayer MD Entered By: Loletha Grayer on 11/21/2015 14:31:46 Arthur [name] ([id number]) -------------------------------------------------------------------------------- ROS/PFSH Details Patient Name: Arthur [name] Date of Service: 11/21/2015 1:30 PM Medical Record Number: [id number] Patient Account Number: [id number] Date of Birth/Sex: 11/30/1924 (82 y.o. Male) Treating RN: Baruch Gouty, RN, BSN, Velva Harman Primary Care Physician: [name] Other Clinician: Referring Physician: [name] Treating Physician/Extender: BURNS III, Terril Chestnut Weeks in Treatment: 0 Label Progress Note Print Version as History and Physical for this encounter Information Obtained From Patient Wound History Do you currently have one or more open woundso Yes How many open wounds do you currently haveo 1 Approximately how long have you had your woundso 3weeks How have you been treating your wound(s) until nowo dry dressing Has your wound(s) ever healed and then re-openedo No Have you had any lab work done in the past montho No Have you tested positive for an antibiotic resistant organism (MRSA, VRE)o No Have you tested positive for osteomyelitis (bone  infection)o No Have you had any tests for circulation on your legso No Have you had other problems associated with your woundso Swelling Hematologic/Lymphatic Complaints and Symptoms: Positive for: Bleeding / Clotting Disorders - on chronic anticoagulants Medical History: Positive for: Anemia Cardiovascular Complaints and Symptoms: Positive for: LE edema Medical History: Positive  for: Arrhythmia; Coronary Artery Disease; Deep Vein Thrombosis; Hypertension; Myocardial Infarction Past Medical History Notes: AAA, HLD Integumentary (Skin) Complaints and Symptoms: Positive for: Wounds; Breakdown; Swelling Medical History: Negative for: History of Burn; History of pressure wounds Reller, Roanna Epley ([id number]) Neurologic Complaints and Symptoms: No Complaints or Symptoms Complaints and Symptoms: Negative for: Numbness/parasthesias; Focal/Weakness Medical History: Negative for: Dementia; Neuropathy; Quadriplegia; Paraplegia; Seizure Disorder Constitutional Symptoms (General Health) Complaints and Symptoms: No Complaints or Symptoms Eyes Complaints and Symptoms: No Complaints or Symptoms Medical History: Positive for: Cataracts - removed Negative for: Glaucoma; Optic Neuritis Ear/Nose/Mouth/Throat Complaints and Symptoms: No Complaints or Symptoms Medical History: Negative for: Chronic sinus problems/congestion; Middle ear problems Respiratory Complaints and Symptoms: No Complaints or Symptoms Medical History: Negative for: Aspiration; Asthma; Chronic Obstructive Pulmonary Disease (COPD); Pneumothorax; Sleep Apnea; Tuberculosis Past Medical History Notes: Acute respiratory failure Gastrointestinal Medical History: Negative for: Cirrhosis ; Colitis; Crohnos; Hepatitis A; Hepatitis B; Hepatitis C Endocrine Complaints and Symptoms: No Complaints or Symptoms TREYVONNE, FELIPE ([id number]) Medical History: Negative for: Type I Diabetes; Type II  Diabetes Genitourinary Complaints and Symptoms: No Complaints or Symptoms Medical History: Past Medical History Notes: Chronic kidney disease, Acute renal failure Immunological Complaints and Symptoms: No Complaints or Symptoms Medical History: Negative for: Lupus Erythematosus; Raynaudos; Scleroderma Musculoskeletal Medical History: Positive for: Osteomyelitis - right foot Oncologic Medical History: Past Medical History Notes: left forearm melanoma excised years ago Psychiatric Medical History: Negative for: Anorexia/bulimia; Confinement Anxiety HBO Extended History Items Eyes: Cataracts Immunizations Tetanus Vaccine: Last tetanus shot: 11/17/1965 Family and Social History Cancer: Yes - Child; Diabetes: No; Heart Disease: Yes - Mother; Hereditary Spherocytosis: No; Hypertension: No; Kidney Disease: No; Lung Disease: No; Seizures: No; Stroke: No; Thyroid Problems: No; Tuberculosis: No; Former smoker; Marital Status - Married; Alcohol Use: Never; Drug Use: No History; Caffeine Use: Daily; Financial Concerns: No; Food, Clothing or Shelter Needs: No; Support System TAEO, SHELLHAMMER ([id number]) Lacking: No; Transportation Concerns: No; Advanced Directives: No; Patient does not want information on Advanced Directives; Living Will: No Physician Affirmation I have reviewed and agree with the above information. Electronic Signature(s) Signed: 11/21/2015 4:44:03 PM By: Regan Lemming BSN, RN Signed: 11/22/2015 8:51:38 AM By: Loletha Grayer MD Entered By: Loletha Grayer on 11/21/2015 14:31:28 Arthur [name] ([id number]) -------------------------------------------------------------------------------- SuperBill Details Patient Name: Arthur [name] Date of Service: 11/21/2015 Medical Record Number: [id number] Patient Account Number: [id number] Date of Birth/Sex: 03-14-1925 (82 y.o. Male) Treating RN: Primary Care Physician: [name] Other Clinician: Referring Physician:  [name] Treating Physician/Extender: BURNS III, Alla Sloma Weeks in Treatment: 0 Diagnosis Coding ICD-10 Codes Code Description [id number] Varicose veins of right lower extremity with both ulcer of calf and inflammation L97.212 Non-pressure chronic ulcer of right calf with fat layer exposed I48.2 Chronic atrial fibrillation I87.2 Venous insufficiency (chronic) (peripheral) Facility Procedures CPT4: Description Modifier Quantity Code [id number] - WOUND CARE VISIT-LEV 3 EST PT 1 CPT4: [id number] - DEB SUBQ TISSUE 20 SQ CM/< 1 ICD-10 Description Diagnosis I83.212 Varicose veins of right lower extremity with both ulcer of calf and inflammation Physician Procedures CPT4: Description Modifier Quantity Code [id number] WC PHYS LEVEL 3 o NEW PT 1 ICD-10 Description Diagnosis I83.212 Varicose veins of right lower extremity with both ulcer of calf and inflammation CPT4: [id number] - WC PHYS SUBQ TISS 20 SQ CM 1 ICD-10 Description Diagnosis I83.212 Varicose veins of right lower extremity with both ulcer of calf and inflammation CALON, FILAR ([id number]) Electronic Signature(s) Signed: 11/22/2015 8:51:38 AM By: Quay Burow,  Fabian Sharp MD Entered By: Loletha Grayer on 11/21/2015 14:31:11

## 2015-11-27 ENCOUNTER — Ambulatory Visit (INDEPENDENT_AMBULATORY_CARE_PROVIDER_SITE_OTHER): Payer: Medicare Other

## 2015-11-27 DIAGNOSIS — I4891 Unspecified atrial fibrillation: Secondary | ICD-10-CM | POA: Diagnosis not present

## 2015-11-27 LAB — POCT INR: INR: 3

## 2015-11-28 ENCOUNTER — Encounter: Payer: Medicare Other | Admitting: Surgery

## 2015-11-28 DIAGNOSIS — I83212 Varicose veins of right lower extremity with both ulcer of calf and inflammation: Secondary | ICD-10-CM | POA: Diagnosis not present

## 2015-11-29 NOTE — Progress Notes (Signed)
STORMY, WEBB ([id number]) Visit Report for 11/28/2015 Chief Complaint Document Details Patient Name: Arthur [name], Arthur [name] Date of Service: 11/28/2015 3:30 PM Medical Record Number: [id number] Patient Account Number: [id number] Date of Birth/Sex: 02/12/1925 (82 y.o. Male) Treating RN: Baruch Gouty, RN, BSN, Velva Harman Primary Care Physician: [name] Other Clinician: Referring Physician: [name] Treating Physician/Extender: BURNS III, Charlean Sanfilippo in Treatment: 1 Information Obtained from: Patient Chief Complaint Right anterior calf ulcer since December 2016. Electronic Signature(s) Signed: 11/28/2015 3:52:06 PM By: Loletha Grayer MD Entered By: Loletha Grayer on 11/28/2015 15:49:36 Arthur [name] ([id number]) -------------------------------------------------------------------------------- Debridement Details Patient Name: Arthur [name] Date of Service: 11/28/2015 3:30 PM Medical Record Number: [id number] Patient Account Number: [id number] Date of Birth/Sex: 15-Jan-1925 (82 y.o. Male) Treating RN: Baruch Gouty, RN, BSN, Velva Harman Primary Care Physician: [name] Other Clinician: Referring Physician: [name] Treating Physician/Extender: BURNS III, Charlean Sanfilippo in Treatment: 1 Debridement Performed for Wound #1 Right,Anterior Lower Leg Assessment: Performed By: Physician BURNS III, Teressa Senter., MD Debridement: Debridement Pre-procedure Yes Verification/Time Out Taken: Start Time: 15:40 Pain Control: Lidocaine 4% Topical Solution Level: Skin/Subcutaneous Tissue Total Area Debrided (L x 5 (cm) x 2 (cm) = 10 (cm) W): Tissue and other Viable, Non-Viable, Exudate, Fat, Fibrin/Slough, Subcutaneous material debrided: Instrument: Curette Bleeding: Minimum Hemostasis Achieved: Pressure End Time: 15:45 Procedural Pain: 0 Post Procedural Pain: 0 Response to Treatment: Procedure was tolerated well Post Debridement Measurements of Total Wound Length: (cm) 5 Width: (cm)  2 Depth: (cm) 0.3 Volume: (cm) 2.356 Post Procedure Diagnosis Same as Pre-procedure Electronic Signature(s) Signed: 11/28/2015 3:52:06 PM By: Loletha Grayer MD Signed: 11/28/2015 4:39:12 PM By: Regan Lemming BSN, RN Entered By: Loletha Grayer on 11/28/2015 15:49:28 Arthur [name] ([id number]) -------------------------------------------------------------------------------- HPI Details Patient Name: Arthur [name] Date of Service: 11/28/2015 3:30 PM Medical Record Number: [id number] Patient Account Number: [id number] Date of Birth/Sex: 02-Jan-1925 (82 y.o. Male) Treating RN: Baruch Gouty, RN, BSN, Velva Harman Primary Care Physician: [name] Other Clinician: Referring Physician: [name] Treating Physician/Extender: BURNS III, Stefano Trulson Weeks in Treatment: 1 History of Present Illness HPI Description: Pleasant 82 year old with history of chronic venous insufficiency (status post endovenous ablation on the left), A. fib (on Coumadin), autoimmune hepatitis (on steroids), chronic kidney disease, and coronary artery disease. No history of diabetes. No known history of PAD. Status post right second toe amputation in 2014. Has a history of venous stasis ulcerations on the left, which healed with compression. Usually wears compression stockings, 15-20 mmHg. He traumatized his right anterior calf on a chair in mid December 2016. Subsequently hospitalized for pneumonia. Completed antibiotics. Performing dressing changes with silver alginate and using a Tubigrip for edema control. Ambulating per his baseline. No claudication or rest pain. Right ABI noncompressible. Left ABI 1.2. No fever or chills. Moderate drainage. Baseline edema. Electronic Signature(s) Signed: 11/28/2015 3:52:06 PM By: Loletha Grayer MD Entered By: Loletha Grayer on 11/28/2015 15:50:24 Arthur [name] ([id number]) -------------------------------------------------------------------------------- Physical Exam  Details Patient Name: Arthur [name] Date of Service: 11/28/2015 3:30 PM Medical Record Number: [id number] Patient Account Number: [id number] Date of Birth/Sex: [birth year] (82 y.o. Male) Treating RN: Baruch Gouty, RN, BSN, Velva Harman Primary Care Physician: [name] Other Clinician: Referring Physician: [name] Treating Physician/Extender: BURNS III, Holley Kocurek Weeks in Treatment: 1 Constitutional . Pulse regular. Respirations normal and unlabored. Afebrile. . Notes Right anterior calf ulceration. Full-thickness. No exposed deep structures. No significant cellulitis. 2+ pitting edema. No palpable pedal pulses. Dopplerable DP and PT, both biphasic. ABI noncompressible. Left ABI  1.2. Electronic Signature(s) Signed: 11/28/2015 3:52:06 PM By: Loletha Grayer MD Entered By: Loletha Grayer on 11/28/2015 15:50:49 Arthur [name] ([id number]) -------------------------------------------------------------------------------- Physician Orders Details Patient Name: Arthur [name] Date of Service: 11/28/2015 3:30 PM Medical Record Number: [id number] Patient Account Number: [id number] Date of Birth/Sex: September 17, 1925 (82 y.o. Male) Treating RN: Baruch Gouty, RN, BSN, Velva Harman Primary Care Physician: [name] Other Clinician: Referring Physician: [name] Treating Physician/Extender: BURNS III, Charlean Sanfilippo in Treatment: 1 Verbal / Phone Orders: Yes Clinician: Afful, RN, BSN, Rita Read Back and Verified: Yes Diagnosis Coding Wound Cleansing Wound #1 Right,Anterior Lower Leg o Clean wound with Normal Saline. Anesthetic Wound #1 Right,Anterior Lower Leg o Topical Lidocaine 4% cream applied to wound bed prior to debridement Primary Wound Dressing Wound #1 Right,Anterior Lower Leg o Aquacel Ag Secondary Dressing Wound #1 Right,Anterior Lower Leg o Boardered Foam Dressing Dressing Change Frequency Wound #1 Right,Anterior Lower Leg o Change dressing every other  day. Follow-up Appointments Wound #1 Right,Anterior Lower Leg o Return Appointment in 1 week. Edema Control Wound #1 Right,Anterior Lower Leg o Patient to wear own compression stockings o Patient to wear own Juxtalite/Juzo compression garment. - order juxtalite/juzo wrap o Elevate legs to the level of the heart and pump ankles as often as possible o Tubigrip Additional Orders / Instructions Wound #1 Right,Anterior Lower Leg o Increase protein intake. o Activity as tolerated Arthur [name], Arthur [name] ([id number]) Consults o Vascular - Arterial studies of right leg Electronic Signature(s) Signed: 11/28/2015 3:52:06 PM By: Loletha Grayer MD Signed: 11/28/2015 4:39:12 PM By: Regan Lemming BSN, RN Entered By: Regan Lemming on 11/28/2015 15:44:26 Arthur [name] ([id number]) -------------------------------------------------------------------------------- Problem List Details Patient Name: Arthur [name] Date of Service: 11/28/2015 3:30 PM Medical Record Number: [id number] Patient Account Number: [id number] Date of Birth/Sex: June 10, 1925 (82 y.o. 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 Male) Treating RN: Baruch Gouty, RN, BSN, Velva Harman Primary Care Physician: [name] Other Clinician: Referring Physician: [name] Treating Physician/Extender: BURNS III, Charlean Sanfilippo in Treatment: 1 Subjective Chief Complaint Information obtained from Patient Right anterior calf ulcer since December 2016. History of Present Illness (HPI) Pleasant 82 year old with history of chronic venous insufficiency (status post endovenous ablation on the left), A. fib (on Coumadin), autoimmune hepatitis (on steroids), chronic kidney disease, and coronary artery disease. No history of diabetes. No known history of PAD. Status post right second toe amputation in 2014. Has a history of venous stasis ulcerations on the left, which healed with compression. Usually wears compression stockings, 15-20 mmHg. He traumatized his right anterior calf on a chair in mid December 2016. Subsequently hospitalized for pneumonia. Completed antibiotics. Performing dressing changes with silver alginate and using a Tubigrip for edema control. Ambulating per his baseline. No claudication or rest  pain. Right ABI noncompressible. Left ABI 1.2. No fever or chills. Moderate drainage. Baseline edema. Objective Constitutional Pulse regular. Respirations normal and unlabored. Afebrile. Vitals Time Taken: 3:28 PM, Height: 74 in, Weight: 204 lbs, BMI: 26.2, Temperature: 97.7 F, Pulse: 58 bpm, Respiratory Rate: 18 breaths/min, Blood Pressure: 150/61 mmHg. General Notes: Right anterior calf ulceration. Full-thickness. No exposed deep structures. No significant cellulitis. 2+ pitting edema. No palpable pedal pulses. Dopplerable DP and PT, both  biphasic. ABI noncompressible. Left ABI 1.2. Integumentary (Hair, Skin) Wound #1 status is Open. Original cause of wound was Trauma. The wound is located on the Right,Anterior Lower Leg. The wound measures 5cm length x 2cm width x 0.2cm depth; 7.854cm^2 area Arthur [name], Arthur [name]. ([id number]) and 1.571cm^3 volume. The wound is limited to skin breakdown. There is no tunneling or undermining noted. There is a medium amount of serosanguineous drainage noted. The wound margin is distinct with the outline attached to the wound base. There is small (1-33%) pink granulation within the wound bed. There is a large (67-100%) amount of necrotic tissue within the wound bed including Adherent Slough. The periwound skin appearance exhibited: Localized Edema, Moist. The periwound skin appearance did not exhibit: Callus, Crepitus, Excoriation, Fluctuance, Friable, Induration, Rash, Scarring, Dry/Scaly, Maceration, Atrophie Blanche, Cyanosis, Ecchymosis, Hemosiderin Staining, Mottled, Pallor, Rubor, Erythema. Periwound temperature was noted as No Abnormality. Assessment Active Problems ICD-10 I83.212 - Varicose veins of right lower extremity with both ulcer of calf and inflammation L97.212 - Non-pressure chronic ulcer of right calf with fat layer exposed I48.2 - Chronic atrial fibrillation I87.2 - Venous insufficiency (chronic) (peripheral) Right anterior calf traumatic ulceration, exacerbated by edema. Procedures Wound #1 Wound #1 is a Venous Leg Ulcer located on the Right,Anterior Lower Leg . There was a Skin/Subcutaneous Tissue Debridement [id number]) debridement with total area of 10 sq cm performed by BURNS III, Teressa Senter., MD. with the following instrument(s): Curette to remove Viable and Non-Viable tissue/material including Exudate, Fat, Fibrin/Slough, and Subcutaneous after achieving pain control using Lidocaine 4% Topical Solution. A time out was conducted prior to the start of the procedure. A  Minimum amount of bleeding was controlled with Pressure. The procedure was tolerated well with a pain level of 0 throughout and a pain level of 0 following the procedure. Post Debridement Measurements: 5cm length x 2cm width x 0.3cm depth; 2.356cm^3 volume. Post procedure Diagnosis Wound #1: Same as Pre-Procedure Plan Arthur [name], Arthur [name] ([id number]) Wound Cleansing: Wound #1 Right,Anterior Lower Leg: Clean wound with Normal Saline. Anesthetic: Wound #1 Right,Anterior Lower Leg: Topical Lidocaine 4% cream applied to wound bed prior to debridement Primary Wound Dressing: Wound #1 Right,Anterior Lower Leg: Aquacel Ag Secondary Dressing: Wound #1 Right,Anterior Lower Leg: Boardered Foam Dressing Dressing Change Frequency: Wound #1 Right,Anterior Lower Leg: Change dressing every other day. Follow-up Appointments: Wound #1 Right,Anterior Lower Leg: Return Appointment in 1 week. Edema Control: Wound #1 Right,Anterior Lower Leg: Patient to wear own compression stockings Patient to wear own Juxtalite/Juzo compression garment. - order juxtalite/juzo wrap Elevate legs to the level of the heart and pump ankles as often as possible Tubigrip Additional Orders / Instructions: Wound #1 Right,Anterior Lower Leg: Increase protein intake. Activity as tolerated Consults ordered were: Vascular - Arterial studies of right leg Continue with silver alginate. Double layer Tubigrip. Order juxtalite. Arterial ultrasound. If no significant improvement we'll consider compression bandages after arterial ultrasound. Electronic Signature(s) Signed: 11/28/2015 3:52:06 PM By: Loletha Grayer MD Entered By: Loletha Grayer on 11/28/2015 15:51:31 Arthur [name] ([id number]) --------------------------------------------------------------------------------  SuperBill Details Patient Name: Arthur [name], Arthur [name] Date of Service: 11/28/2015 Medical Record Number: [id number] Patient Account Number:  [id number] Date of Birth/Sex: February 27, 1925 (82 y.o. 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 I48.2 Chronic atrial fibrillation I87.2 Venous insufficiency (chronic) (peripheral) Facility Procedures CPT4: Description Modifier Quantity Code [id number] - DEB SUBQ TISSUE 20 SQ CM/< 1 ICD-10 Description Diagnosis I83.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

## 2015-11-29 NOTE — Progress Notes (Signed)
ZEEV, WIANT ([id number]) Visit Report for 11/28/2015 Arrival Information Details Patient Name: Arthur, [name] Date of Service: 11/28/2015 3:30 PM Medical Record Number: [id number] Patient Account Number: [id number] Date of Birth/Sex: Apr 10, 1925 (82 y.o. Male) Treating RN: Baruch Gouty, RN, BSN, Velva Harman Primary Care Physician: [name] Other Clinician: Referring Physician: [name] Treating Physician/Extender: BURNS III, Charlean Sanfilippo in Treatment: 1 Visit Information History Since Last Visit Added or deleted any medications: No Patient Arrived: Ambulatory Any new allergies or adverse reactions: No Arrival Time: 15:25 Had a fall or experienced change in No Accompanied By: wife activities of daily living that may affect Transfer Assistance: None risk of falls: Patient Identification Verified: Yes Signs or symptoms of abuse/neglect since last No Secondary Verification Process Yes visito Completed: Hospitalized since last visit: No Patient Requires Transmission- No Has Dressing in Place as Prescribed: Yes Based Precautions: Has Compression in Place as Prescribed: Yes Patient Has Alerts: Yes Pain Present Now: No Patient Alerts: Patient on Blood Thinner coumadin Electronic Signature(s) Signed: 11/28/2015 4:39:12 PM By: Regan Lemming BSN, RN Entered By: Regan Lemming on 11/28/2015 15:27:07 Arthur [name] ([id number]) -------------------------------------------------------------------------------- Encounter Discharge Information Details Patient Name: Arthur [name] Date of Service: 11/28/2015 3:30 PM Medical Record Number: [id number] Patient Account Number: [id number] Date of Birth/Sex: 1924-12-14 (82 y.o. Male) Treating RN: Baruch Gouty, RN, BSN, Velva Harman Primary Care Physician: [name] Other Clinician: Referring Physician: [name] Treating Physician/Extender: BURNS III, Charlean Sanfilippo in Treatment: 1 Encounter Discharge Information Items Discharge Pain Level:  0 Discharge Condition: Stable Ambulatory Status: Ambulatory Discharge Destination: Home Transportation: Private Auto Accompanied By: wife Schedule Follow-up Appointment: No Medication Reconciliation completed and provided to Patient/Care No Bambi Fehnel: Provided on Clinical Summary of Care: 11/28/2015 Form Type Recipient Paper Patient [facility] Electronic Signature(s) Signed: 11/28/2015 4:01:34 PM By: Ruthine Dose Entered By: Ruthine Dose on 11/28/2015 16:01:34 Arthur [name] ([id number]) -------------------------------------------------------------------------------- Lower Extremity Assessment Details Patient Name: Arthur [name] Date of Service: 11/28/2015 3:30 PM Medical Record Number: [id number] Patient Account Number: [id number] Date of Birth/Sex: 1925/10/04 (82 y.o. Male) Treating RN: Baruch Gouty, RN, BSN, Playita Cortada Primary Care Physician: [name] Other Clinician: Referring Physician: [name] Treating Physician/Extender: BURNS III, WALTER Weeks in Treatment: 1 Edema Assessment Assessed: [Left: No] [Right: No] Edema: [Left: Ye] [Right: s] Calf Left: Right: Point of Measurement: 34 cm From Medial Instep 38.6 cm 38.2 cm Ankle Left: Right: Point of Measurement: 9 cm From Medial Instep cm 28.5 cm Vascular Assessment Claudication: Claudication Assessment [Right:None] Pulses: Posterior Tibial Dorsalis Pedis Palpable: [Right:Yes] Extremity colors, hair growth, and conditions: Extremity Color: [Right:Mottled] Hair Growth on Extremity: [Right:No] Temperature of Extremity: [Right:Warm] Capillary Refill: [Right:< 3 seconds] Toe Nail Assessment Left: Right: Thick: Yes Discolored: Yes Improper Length and Hygiene: Yes Electronic Signature(s) Signed: 11/28/2015 4:39:12 PM By: Regan Lemming BSN, RN Entered By: Regan Lemming on 11/28/2015 15:34:34 Arthur [name] ([name]  ([id number]) -------------------------------------------------------------------------------- Multi Wound Chart Details Patient Name: Arthur [name] Date of Service: 11/28/2015 3:30 PM Medical Record Number: [id number] Patient Account Number: [id number] Date of Birth/Sex: 04-10-1925 (82 y.o. Male) Treating RN: Baruch Gouty, RN, BSN, Velva Harman Primary Care Physician: [name] Other Clinician: Referring Physician: [name] Treating Physician/Extender: BURNS III, WALTER Weeks in Treatment: 1 Vital Signs Height(in): 74 Pulse(bpm): 58 Weight(lbs): 204 Blood Pressure 150/61 (mmHg): Body Mass Index(BMI): 26 Temperature(F): 97.7 Respiratory Rate 18 (breaths/min): Photos: [1:No Photos] [N/A:N/A] Wound Location: [1:Right Lower Leg - Anterior N/A] Wounding Event: [1:Trauma] [N/A:N/A] Primary Etiology: [1:Venous Leg Ulcer] [N/A:N/A] Comorbid History: [1:Cataracts, Anemia, Arrhythmia,  Coronary Artery Disease, Deep Vein Thrombosis, Hypertension, Myocardial Infarction, Osteomyelitis] [N/A:N/A] Date Acquired: [1:11/12/2015] [N/A:N/A] Weeks of Treatment: [1:1] [N/A:N/A] Wound Status: [1:Open] [N/A:N/A] Measurements L x W x D 5x2x0.2 [N/A:N/A] (cm) Area (cm) : [1:7.854] [N/A:N/A] Volume (cm) : [1:1.571] [N/A:N/A] % Reduction in Area: [1:19.50%] [N/A:N/A] % Reduction in Volume: 19.50% [N/A:N/A] Classification: [1:Full Thickness Without Exposed Support Structures] [N/A:N/A] Exudate Amount: [1:Medium] [N/A:N/A] Exudate Type: [1:Serosanguineous] [N/A:N/A] Exudate Color: [1:red, brown] [N/A:N/A] Wound Margin: [1:Distinct, outline attached N/A] Granulation Amount: [1:Small (1-33%)] [N/A:N/A] Granulation Quality: [1:Pink] [N/A:N/A] Necrotic Amount: [1:Large (67-100%)] [N/A:N/A] Exposed Structures: [N/A:N/A] Fascia: No Fat: No Tendon: No Muscle: No Joint: No Bone: No Limited to Skin Breakdown Epithelialization: None N/A N/A Periwound Skin Texture: Edema: Yes N/A N/A Excoriation:  No Induration: No Callus: No Crepitus: No Fluctuance: No Friable: No Rash: No Scarring: No Periwound Skin Moist: Yes N/A N/A Moisture: Maceration: No Dry/Scaly: No Periwound Skin Color: Atrophie Blanche: No N/A N/A Cyanosis: No Ecchymosis: No Erythema: No Hemosiderin Staining: No Mottled: No Pallor: No Rubor: No Temperature: No Abnormality N/A N/A Tenderness on No N/A N/A Palpation: Wound Preparation: Ulcer Cleansing: N/A N/A Rinsed/Irrigated with Saline Topical Anesthetic Applied: Other: lidocaine 4% Treatment Notes Electronic Signature(s) Signed: 11/28/2015 4:39:12 PM By: Regan Lemming BSN, RN Entered By: Regan Lemming on 11/28/2015 15:39:33 Arthur [name] ([id number]) -------------------------------------------------------------------------------- [place] Details Patient Name: Arthur [name] Date of Service: 11/28/2015 3:30 PM Medical Record Number: [id number] Patient Account Number: [id number] Date of Birth/Sex: November 07, 1925 (82 y.o. Male) Treating RN: Baruch Gouty, RN, BSN, Velva Harman Primary Care Physician: [name] Other Clinician: Referring Physician: [name] Treating Physician/Extender: BURNS III, Charlean Sanfilippo in Treatment: 1 Active Inactive Orientation to the Wound Care Program Nursing Diagnoses: Knowledge deficit related to the wound healing center program Goals: Patient/caregiver will verbalize understanding of the [place] Program Date Initiated: 11/21/2015 Goal Status: Active Interventions: Provide education on orientation to the wound center Notes: Venous Leg Ulcer Nursing Diagnoses: Actual venous Insuffiency (use after diagnosis is confirmed) Goals: Patient will maintain optimal edema control Date Initiated: 11/21/2015 Goal Status: Active Interventions: Assess peripheral edema status every visit. Notes: Wound/Skin Impairment Nursing Diagnoses: Knowledge deficit related to ulceration/compromised skin  integrity Goals: Ulcer/skin breakdown will heal within 14 weeks Date Initiated: 11/21/2015 Arthur [name] ([id number]) Goal Status: Active Interventions: Assess ulceration(s) every visit Notes: Electronic Signature(s) Signed: 11/28/2015 4:39:12 PM By: Regan Lemming BSN, RN Entered By: Regan Lemming on 11/28/2015 15:38:48 Arthur [name] ([id number]) -------------------------------------------------------------------------------- Patient/Caregiver Education Details Patient Name: Arthur [name] Date of Service: 11/28/2015 3:30 PM Medical Record Number: [id number] Patient Account Number: [id number] Date of Birth/Gender: [birth year] (82 y.o. Male) Treating RN: Baruch Gouty, RN, BSN, Velva Harman Primary Care Physician: [name] Other Clinician: Referring Physician: [name] Treating Physician/Extender: BURNS III, Charlean Sanfilippo in Treatment: 1 Education Assessment Education Provided To: Patient Education Topics Provided Welcome To [facility]: Methods: Explain/Verbal Responses: State content correctly Electronic Signature(s) Signed: 11/28/2015 4:39:12 PM By: Regan Lemming BSN, RN Entered By: Regan Lemming on 11/28/2015 15:45:44 Arthur [name] ([id number]) -------------------------------------------------------------------------------- Wound Assessment Details Patient Name: Arthur [name] Date of Service: 11/28/2015 3:30 PM Medical Record Number: [id number] Patient Account Number: [id number] Date of Birth/Sex: [birth year] (82 y.o. Male) Treating RN: Baruch Gouty, RN, BSN, Velva Harman Primary Care Physician: [name] Other Clinician: Referring Physician: [name] Treating Physician/Extender: BURNS III, WALTER Weeks in Treatment: 1 Wound Status Wound Number: 1 Primary Venous Leg Ulcer Etiology: Wound Location: Right Lower Leg - Anterior Wound Open Wounding Event: Trauma Status:  Date Acquired: 11/12/2015 Comorbid Cataracts, Anemia, Arrhythmia, Weeks Of Treatment:  1 History: Coronary Artery Disease, Deep Vein Clustered Wound: No Thrombosis, Hypertension, Myocardial Infarction, Osteomyelitis Photos Photo Uploaded By: Regan Lemming on 11/28/2015 16:19:41 Wound Measurements Length: (cm) 5 Width: (cm) 2 Depth: (cm) 0.2 Area: (cm) 7.854 Volume: (cm) 1.571 % Reduction in Area: 19.5% % Reduction in Volume: 19.5% Epithelialization: None Tunneling: No Undermining: No Wound Description Full Thickness Without Exposed Classification: Support Structures Wound Margin: Distinct, outline attached Exudate Medium Amount: Exudate Type: Serosanguineous Exudate Color: red, brown Foul Odor After Cleansing: No Wound Bed Granulation Amount: Small (1-33%) Exposed Structure Arthur [name], Arthur [name] ([id number]) Granulation Quality: Pink Fascia Exposed: No Necrotic Amount: Large (67-100%) Fat Layer Exposed: No Necrotic Quality: Adherent Slough Tendon Exposed: No Muscle Exposed: No Joint Exposed: No Bone Exposed: No Limited to Skin Breakdown Periwound Skin Texture Texture Color No Abnormalities Noted: No No Abnormalities Noted: No Callus: No Atrophie Blanche: No Crepitus: No Cyanosis: No Excoriation: No Ecchymosis: No Fluctuance: No Erythema: No Friable: No Hemosiderin Staining: No Induration: No Mottled: No Localized Edema: Yes Pallor: No Rash: No Rubor: No Scarring: No Temperature / Pain Moisture Temperature: No Abnormality No Abnormalities Noted: No Dry / Scaly: No Maceration: No Moist: Yes Wound Preparation Ulcer Cleansing: Rinsed/Irrigated with Saline Topical Anesthetic Applied: Other: lidocaine 4%, Treatment Notes Wound #1 (Right, Anterior Lower Leg) 1. Cleansed with: Clean wound with Normal Saline 4. Dressing Applied: Aquacel Ag 5. Secondary Dressing Applied Bordered Foam Dressing 7. Secured with Financial risk analyst) Signed: 11/28/2015 4:39:12 PM By: Regan Lemming BSN, RN Entered By: Regan Lemming on 11/28/2015  15:33:20 Arthur [name] ([id number]) -------------------------------------------------------------------------------- [place] Details Patient Name: Arthur [name] Date of Service: 11/28/2015 3:30 PM Medical Record Number: [id number] Patient Account Number: [id number] Date of Birth/Sex: [birth year] (82 y.o. Male) Treating RN: Baruch Gouty, RN, BSN, Velva Harman Primary Care Physician: [name] Other Clinician: Referring Physician: [name] Treating Physician/Extender: BURNS III, WALTER Weeks in Treatment: 1 Vital Signs Time Taken: 15:28 Temperature (F): 97.7 Height (in): 74 Pulse (bpm): 58 Weight (lbs): 204 Respiratory Rate (breaths/min): 18 Body Mass Index (BMI): 26.2 Blood Pressure (mmHg): 150/61 Reference Range: 80 - 120 mg / dl Electronic Signature(s) Signed: 11/28/2015 4:39:12 PM By: Regan Lemming BSN, RN Entered By: Regan Lemming on 11/28/2015 15:27:48

## 2015-11-30 ENCOUNTER — Telehealth: Payer: Self-pay

## 2015-11-30 ENCOUNTER — Encounter: Payer: Self-pay | Admitting: Vascular Surgery

## 2015-11-30 ENCOUNTER — Other Ambulatory Visit (INDEPENDENT_AMBULATORY_CARE_PROVIDER_SITE_OTHER): Payer: Medicare Other

## 2015-11-30 DIAGNOSIS — K754 Autoimmune hepatitis: Secondary | ICD-10-CM | POA: Diagnosis not present

## 2015-11-30 LAB — HEPATIC FUNCTION PANEL
ALBUMIN: 3.4 g/dL — AB (ref 3.5–5.2)
ALT: 36 U/L (ref 0–53)
AST: 37 U/L (ref 0–37)
Alkaline Phosphatase: 83 U/L (ref 39–117)
BILIRUBIN DIRECT: 0.1 mg/dL (ref 0.0–0.3)
TOTAL PROTEIN: 6.2 g/dL (ref 6.0–8.3)
Total Bilirubin: 0.8 mg/dL (ref 0.2–1.2)

## 2015-11-30 NOTE — Telephone Encounter (Signed)
Pt aware.

## 2015-11-30 NOTE — Telephone Encounter (Signed)
-----   Message from Algernon Huxley, RN sent at 11/09/2015  3:35 PM EST ----- Regarding: Labs Needs labs, orders in

## 2015-12-03 ENCOUNTER — Encounter: Payer: Self-pay | Admitting: Vascular Surgery

## 2015-12-03 ENCOUNTER — Ambulatory Visit (INDEPENDENT_AMBULATORY_CARE_PROVIDER_SITE_OTHER): Payer: Medicare Other | Admitting: Vascular Surgery

## 2015-12-03 ENCOUNTER — Other Ambulatory Visit: Payer: Self-pay | Admitting: *Deleted

## 2015-12-03 VITALS — BP 132/77 | HR 69 | Resp 18 | Ht 74.0 in | Wt 204.0 lb

## 2015-12-03 DIAGNOSIS — L97319 Non-pressure chronic ulcer of right ankle with unspecified severity: Principal | ICD-10-CM

## 2015-12-03 DIAGNOSIS — I83891 Varicose veins of right lower extremities with other complications: Secondary | ICD-10-CM

## 2015-12-03 DIAGNOSIS — I83013 Varicose veins of right lower extremity with ulcer of ankle: Secondary | ICD-10-CM

## 2015-12-03 NOTE — Progress Notes (Signed)
Subjective:     Patient ID: Arthur [name], male   DOB: 1925/10/08, 82 y.o.   MRN: [id number]  HPI this 82 year old male returns for discussion regarding laser ablation right great saphenous vein. He had been previously evaluated for bilateral laser ablations and after having the left side one is to wait a few months. He developed an ulceration in the pretibial area of his right leg after injuring the leg. He continues to have chronic edema in the right leg. The ulceration is very slow to heal. He is going to the wound center in [place]. He has been wearing long leg elastic compression stockings and trying elevation and ibuprofen as he had been doing previously. He does have chronic atrial fib and is on Coumadin therapy. His left leg is doing well with no recurrent ulcerations noted.  Past Medical History  Diagnosis Date  . Coronary artery disease     MI, atherectomy 1993  . Hyperlipidemia   . Arrhythmia     1995  . Atrial fibrillation ([place])   . History of transient ischemic attack (TIA) 2005    double vision  . Hypertension   . History of gallstones   . Myocardial infarct ([place]) 12/98    inferior  . Eczema   . Elevated LFTs   . Skin cancer   . DDD (degenerative disc disease)   . Pneumonia     long time ago  . Varicose veins     Social History  Substance Use Topics  . Smoking status: Former Smoker -- 7 years    Types: 10, Pipe    Quit date: 11/18/1967  . Smokeless tobacco: Never Used     Comment: 1-2 cigars a day, pipe  . Alcohol Use: 0.0 oz/week    0 Standard drinks or equivalent per week     Comment: 2-3 glasses of wine in the evening    Family History  Problem Relation Age of Onset  . Heart attack Mother 61    MI  . Pneumonia Father     Allergies  Allergen Reactions  . Penicillins Hives     Current outpatient prescriptions:  .  losartan (COZAAR) 50 MG tablet, TAKE 1 TABLET (50 MG TOTAL) BY MOUTH DAILY., Disp: 90 tablet, Rfl: 3 .  metoprolol tartrate  (LOPRESSOR) 25 MG tablet, Take 25 mg by mouth daily., Disp: , Rfl: 1 .  predniSONE (DELTASONE) 10 MG tablet, Take 1 tablet (10 mg total) by mouth daily with breakfast. 40 mg for 2 days then 30 mg for 2 days then 20 mg for 2 days then return to 10 mg daily, Disp: 30 tablet, Rfl: 0 .  warfarin (COUMADIN) 5 MG tablet, TAKE AS DIRECTED BY COUMADIN CLINIC., Disp: 30 tablet, Rfl: 2 .  albuterol (PROVENTIL HFA;VENTOLIN HFA) 108 (90 BASE) MCG/ACT inhaler, Inhale 2 puffs into the lungs every 6 (six) hours as needed for wheezing or shortness of breath. (Patient not taking: Reported on 12/03/2015), Disp: 1 Inhaler, Rfl: 2 .  furosemide (LASIX) 20 MG tablet, Take 1 tablet (20 mg total) by mouth daily. (Patient not taking: Reported on 12/03/2015), Disp: 30 tablet, Rfl: 1  Filed Vitals:   12/03/15 1011  BP: 132/77  Pulse: 69  Resp: 18  Height: 6\' 2"  (1.88 m)  Weight: 204 lb (92.534 kg)  SpO2: 96%    Body mass index is 26.18 kg/(m^2).         Review of Systems denies active chest pain, dyspnea on exertion, PND, orthopnea. Is  in chronic atrial fib. Denies claudication. Able to walk 5 minutes on the treadmill.     Objective:   Physical Exam BP 132/77 mmHg  Pulse 69  Resp 18  Ht 6\' 2"  (1.88 m)  Wt 204 lb (92.534 kg)  BMI 26.18 kg/m2  SpO2 96%  Gen. elderly male in no apparent distress alert and oriented 3 Lungs no rhonchi or wheezing Cardiovascular irregular rhythm no murmurs Right leg with linear ulceration distal third pretibial area about 5 cm in length with second area more proximally in pretibial region about 2 cm in length. 2+ chronic edema. Brisk Doppler flow in PT and DP.  I reviewed the previous venous duplex exam which does reveal gross reflux in the right great saphenous vein     Assessment:     Slowly healing ulceration right leg with gross reflux right great saphenous vein Chronic atrial fib on Coumadin therapy Status post laser ablation left great saphenous vein with good  result-healing of ulceration    Plan:     Patient needs laser ablation right great saphenous vein area and we'll schedule this for early March. This has been approved in the past-July 2016. We will proceed with recertification to perform this in the near future and hopefully facilitate healing of this ulcer line patient will need to come off of Coumadin for about 5 days prior to the procedure

## 2015-12-05 ENCOUNTER — Encounter: Payer: Medicare Other | Admitting: Surgery

## 2015-12-05 DIAGNOSIS — I83212 Varicose veins of right lower extremity with both ulcer of calf and inflammation: Secondary | ICD-10-CM | POA: Diagnosis not present

## 2015-12-05 NOTE — Progress Notes (Addendum)
Arthur [name], Arthur [name] ([id number]) Visit Report for 12/05/2015 Arrival Information Details Patient Name: Arthur [name], Arthur [name] Date of Service: 12/05/2015 3:00 PM Medical Record Number: [id number] Patient Account Number: [id number] Date of Birth/Sex: 11/09/1925 (82 y.o. Male) Treating RN: Baruch Gouty, RN, BSN, Velva Harman Primary Care Physician: [name] Other Clinician: Referring Physician: [name] Treating Physician/Extender: BURNS III, Charlean Sanfilippo in Treatment: 2 Visit Information History Since Last Visit Added or deleted any medications: No Patient Arrived: Ambulatory Any new allergies or adverse reactions: No Arrival Time: 14:54 Had a fall or experienced change in No Accompanied By: wife activities of daily living that may affect Transfer Assistance: None risk of falls: Patient Identification Verified: Yes Signs or symptoms of abuse/neglect since last No Secondary Verification Process Yes visito Completed: Hospitalized since last visit: No Patient Requires Transmission- No Has Dressing in Place as Prescribed: Yes Based Precautions: Pain Present Now: No Patient Has Alerts: Yes Patient Alerts: Patient on Blood Thinner coumadin Electronic Signature(s) Signed: 12/05/2015 2:55:03 PM By: Regan Lemming BSN, RN Entered By: Regan Lemming on 12/05/2015 14:55:03 Arthur [name] ([id number]) -------------------------------------------------------------------------------- Encounter Discharge Information Details Patient Name: Arthur [name] Date of Service: 12/05/2015 3:00 PM Medical Record Number: [id number] Patient Account Number: [id number] Date of Birth/Sex: July 16, 1925 (82 y.o. Male) Treating RN: Baruch Gouty, RN, BSN, Velva Harman Primary Care Physician: [name] Other Clinician: Referring Physician: [name] Treating Physician/Extender: BURNS III, Charlean Sanfilippo in Treatment: 2 Encounter Discharge Information Items Discharge Pain Level: 0 Discharge Condition: Stable Ambulatory  Status: Ambulatory Discharge Destination: Home Transportation: Private Auto Accompanied By: wife Schedule Follow-up Appointment: No Medication Reconciliation completed and provided to Patient/Care No Winner Valeriano: Provided on Clinical Summary of Care: 12/05/2015 Form Type Recipient Paper Patient [facility] Electronic Signature(s) Signed: 12/05/2015 3:26:02 PM By: Ruthine Dose Entered By: Ruthine Dose on 12/05/2015 15:26:02 Arthur [name] ([id number]) -------------------------------------------------------------------------------- Lower Extremity Assessment Details Patient Name: Arthur [name] Date of Service: 12/05/2015 3:00 PM Medical Record Number: [id number] Patient Account Number: [id number] Date of Birth/Sex: 1925-04-02 (82 y.o. Male) Treating RN: Baruch Gouty, RN, BSN, [place] Primary Care Physician: [name] Other Clinician: Referring Physician: [name] Treating Physician/Extender: BURNS III, WALTER Weeks in Treatment: 2 Edema Assessment Assessed: [Left: No] [Right: No] Edema: [Left: Ye] [Right: s] Calf Left: Right: Point of Measurement: 34 cm From Medial Instep cm 38.5 cm Ankle Left: Right: Point of Measurement: 9 cm From Medial Instep cm 28.6 cm Vascular Assessment Claudication: Claudication Assessment [Right:None] Pulses: Posterior Tibial Dorsalis Pedis Palpable: [Right:Yes] Extremity colors, hair growth, and conditions: Extremity Color: [Right:Mottled] Hair Growth on Extremity: [Right:Yes] Temperature of Extremity: [Right:Warm] Capillary Refill: [Right:< 3 seconds] Toe Nail Assessment Left: Right: Thick: Yes Discolored: Yes Deformed: No Improper Length and Hygiene: Yes Electronic Signature(s) Signed: 12/05/2015 2:56:17 PM By: Regan Lemming BSN, RN Entered By: Regan Lemming on 12/05/2015 14:56:17 Arthur [name] ([name] ([id number]) -------------------------------------------------------------------------------- Multi Wound Chart  Details Patient Name: Arthur [name] Date of Service: 12/05/2015 3:00 PM Medical Record Number: [id number] Patient Account Number: [id number] Date of Birth/Sex: 1925-08-01 (82 y.o. Male) Treating RN: Baruch Gouty, RN, BSN, Velva Harman Primary Care Physician: [name] Other Clinician: Referring Physician: [name] Treating Physician/Extender: BURNS III, WALTER Weeks in Treatment: 2 Vital Signs Height(in): 74 Pulse(bpm): 66 Weight(lbs): 204 Blood Pressure 148/66 (mmHg): Body Mass Index(BMI): 26 Temperature(F): 97.8 Respiratory Rate 20 (breaths/min): Photos: [1:No Photos] [N/A:N/A] Wound Location: [1:Right Lower Leg - Anterior N/A] Wounding Event: [1:Trauma] [N/A:N/A] Primary Etiology: [1:Venous Leg Ulcer] [N/A:N/A] Comorbid History: [1:Cataracts, Anemia, Arrhythmia, Coronary Artery Disease, Arthur [name] Vein Thrombosis,  Hypertension, Myocardial Infarction, Osteomyelitis] [N/A:N/A] Date Acquired: [1:11/12/2015] [N/A:N/A] Weeks of Treatment: [1:2] [N/A:N/A] Wound Status: [1:Open] [N/A:N/A] Measurements L x W x D 5x1.7x0.2 [N/A:N/A] (cm) Area (cm) : [1:6.676] [N/A:N/A] Volume (cm) : [1:1.335] [N/A:N/A] % Reduction in Area: [1:31.60%] [N/A:N/A] % Reduction in Volume: 31.60% [N/A:N/A] Classification: [1:Full Thickness Without Exposed Support Structures] [N/A:N/A] Exudate Amount: [1:Medium] [N/A:N/A] Exudate Type: [1:Serosanguineous] [N/A:N/A] Exudate Color: [1:red, brown] [N/A:N/A] Wound Margin: [1:Distinct, outline attached N/A] Granulation Amount: [1:Small (1-33%)] [N/A:N/A] Granulation Quality: [1:Pink] [N/A:N/A] Necrotic Amount: [1:Large (67-100%)] [N/A:N/A] Exposed Structures: [N/A:N/A] Fascia: No Fat: No Tendon: No Muscle: No Joint: No Bone: No Limited to Skin Breakdown Epithelialization: None N/A N/A Periwound Skin Texture: Edema: Yes N/A N/A Excoriation: No Induration: No Callus: No Crepitus: No Fluctuance: No Friable: No Rash: No Scarring: No Periwound  Skin Moist: Yes N/A N/A Moisture: Maceration: No Dry/Scaly: No Periwound Skin Color: Atrophie Blanche: No N/A N/A Cyanosis: No Ecchymosis: No Erythema: No Hemosiderin Staining: No Mottled: No Pallor: No Rubor: No Temperature: No Abnormality N/A N/A Tenderness on No N/A N/A Palpation: Wound Preparation: Ulcer Cleansing: N/A N/A Rinsed/Irrigated with Saline Topical Anesthetic Applied: Other: lidocaine 4% Treatment Notes Electronic Signature(s) Signed: 12/05/2015 4:59:08 PM By: Regan Lemming BSN, RN Entered By: Regan Lemming on 12/05/2015 15:10:47 Arthur [name] ([id number]) -------------------------------------------------------------------------------- [place] Details Patient Name: Arthur [name] Date of Service: 12/05/2015 3:00 PM Medical Record Number: [id number] Patient Account Number: [id number] Date of Birth/Sex: [birth year] (82 y.o. Male) Treating RN: Baruch Gouty, RN, BSN, Velva Harman Primary Care Physician: [name] Other Clinician: Referring Physician: [name] Treating Physician/Extender: BURNS III, Charlean Sanfilippo in Treatment: 2 Active Inactive Orientation to the Wound Care Program Nursing Diagnoses: Knowledge deficit related to the wound healing center program Goals: Patient/caregiver will verbalize understanding of the [place] Program Date Initiated: 11/21/2015 Goal Status: Active Interventions: Provide education on orientation to the wound center Notes: Venous Leg Ulcer Nursing Diagnoses: Actual venous Insuffiency (use after diagnosis is confirmed) Goals: Patient will maintain optimal edema control Date Initiated: 11/21/2015 Goal Status: Active Interventions: Assess peripheral edema status every visit. Notes: Wound/Skin Impairment Nursing Diagnoses: Knowledge deficit related to ulceration/compromised skin integrity Goals: Ulcer/skin breakdown will heal within 14 weeks Date Initiated: 11/21/2015 Arthur [name]  ([id number]) Goal Status: Active Interventions: Assess ulceration(s) every visit Notes: Electronic Signature(s) Signed: 12/05/2015 4:59:08 PM By: Regan Lemming BSN, RN Entered By: Regan Lemming on 12/05/2015 15:10:32 Arthur [name] ([id number]) -------------------------------------------------------------------------------- Pain Assessment Details Patient Name: Arthur [name] Date of Service: 12/05/2015 3:00 PM Medical Record Number: [id number] Patient Account Number: [id number] Date of Birth/Sex: 1924-12-23 (82 y.o. Male) Treating RN: Baruch Gouty, RN, BSN, Velva Harman Primary Care Physician: [name] Other Clinician: Referring Physician: [name] Treating Physician/Extender: BURNS III, WALTER Weeks in Treatment: 2 Active Problems Location of Pain Severity and Description of Pain Patient Has Paino No Site Locations Pain Management and Medication Current Pain Management: Electronic Signature(s) Signed: 12/05/2015 2:55:12 PM By: Regan Lemming BSN, RN Entered By: Regan Lemming on 12/05/2015 14:55:12 Arthur [name] ([id number]) -------------------------------------------------------------------------------- Patient/Caregiver Education Details Patient Name: Arthur [name] Date of Service: 12/05/2015 3:00 PM Medical Record Number: [id number] Patient Account Number: [id number] Date of Birth/Gender: 1925-05-10 (82 y.o. Male) Treating RN: Baruch Gouty, RN, BSN, Velva Harman Primary Care Physician: [name] Other Clinician: Referring Physician: [name] Treating Physician/Extender: BURNS III, Charlean Sanfilippo in Treatment: 2 Education Assessment Education Provided To: Patient Education Topics Provided Basic Hygiene: Methods: Explain/Verbal Responses: State content correctly Welcome To [place]: Methods: Explain/Verbal Responses: State  content correctly Electronic Signature(s) Signed: 12/05/2015 4:59:08 PM By: Regan Lemming BSN, RN Entered By: Regan Lemming on 12/05/2015  15:13:47 Arthur [name] ([id number]) -------------------------------------------------------------------------------- Wound Assessment Details Patient Name: Arthur [name] Date of Service: 12/05/2015 3:00 PM Medical Record Number: [id number] Patient Account Number: [id number] Date of Birth/Sex: 1924/12/28 (82 y.o. Male) Treating RN: Baruch Gouty, RN, BSN, [place] Primary Care Physician: [name] Other Clinician: Referring Physician: [name] Treating Physician/Extender: BURNS III, WALTER Weeks in Treatment: 2 Wound Status Wound Number: 1 Primary Venous Leg Ulcer Etiology: Wound Location: Right Lower Leg - Anterior Wound Open Wounding Event: Trauma Status: Date Acquired: 11/12/2015 Comorbid Cataracts, Anemia, Arrhythmia, Weeks Of Treatment: 2 History: Coronary Artery Disease, Arthur [name] Vein Clustered Wound: No Thrombosis, Hypertension, Myocardial Infarction, Osteomyelitis Photos Photo Uploaded By: Regan Lemming on 12/05/2015 16:45:19 Wound Measurements Length: (cm) 5 Width: (cm) 1.7 Depth: (cm) 0.2 Area: (cm) 6.676 Volume: (cm) 1.335 % Reduction in Area: 31.6% % Reduction in Volume: 31.6% Epithelialization: None Tunneling: No Undermining: No Wound Description Full Thickness Without Exposed Classification: Support Structures Wound Margin: Distinct, outline attached Exudate Medium Amount: Exudate Type: Serosanguineous Exudate Color: red, brown Foul Odor After Cleansing: No Wound Bed Granulation Amount: Small (1-33%) Exposed Structure Arthur [name], Arthur [name] ([id number]) Granulation Quality: Pink Fascia Exposed: No Necrotic Amount: Large (67-100%) Fat Layer Exposed: No Necrotic Quality: Adherent Slough Tendon Exposed: No Muscle Exposed: No Joint Exposed: No Bone Exposed: No Limited to Skin Breakdown Periwound Skin Texture Texture Color No Abnormalities Noted: No No Abnormalities Noted: No Callus: No Atrophie Blanche: No Crepitus: No Cyanosis:  No Excoriation: No Ecchymosis: No Fluctuance: No Erythema: No Friable: No Hemosiderin Staining: No Induration: No Mottled: No Localized Edema: Yes Pallor: No Rash: No Rubor: No Scarring: No Temperature / Pain Moisture Temperature: No Abnormality No Abnormalities Noted: No Dry / Scaly: No Maceration: No Moist: Yes Wound Preparation Ulcer Cleansing: Rinsed/Irrigated with Saline Topical Anesthetic Applied: Other: lidocaine 4%, Treatment Notes Wound #1 (Right, Anterior Lower Leg) 1. Cleansed with: Clean wound with Normal Saline 4. Dressing Applied: Aquacel Ag 5. Secondary Dressing Applied Bordered Foam Dressing 7. Secured with Financial risk analyst) Signed: 12/05/2015 4:59:08 PM By: Regan Lemming BSN, RN Entered By: Regan Lemming on 12/05/2015 15:00:45 Arthur [name] ([id number]) -------------------------------------------------------------------------------- [place] Details Patient Name: Arthur [name] Date of Service: 12/05/2015 3:00 PM Medical Record Number: [id number] Patient Account Number: [id number] Date of Birth/Sex: August 26, 1925 (82 y.o. Male) Treating RN: Baruch Gouty, RN, BSN, Velva Harman Primary Care Physician: [name] Other Clinician: Referring Physician: [name] Treating Physician/Extender: BURNS III, WALTER Weeks in Treatment: 2 Vital Signs Time Taken: 15:03 Temperature (F): 97.8 Height (in): 74 Pulse (bpm): 66 Weight (lbs): 204 Respiratory Rate (breaths/min): 20 Body Mass Index (BMI): 26.2 Blood Pressure (mmHg): 148/66 Reference Range: 80 - 120 mg / dl Electronic Signature(s) Signed: 12/05/2015 4:59:08 PM By: Regan Lemming BSN, RN Entered By: Regan Lemming on 12/05/2015 15:03:21

## 2015-12-06 NOTE — Progress Notes (Signed)
ASHAAD, MARSELLA ([id number]) Visit Report for 12/05/2015 Chief Complaint Document Details Patient Name: Arthur [name], Arthur [name] Date of Service: 12/05/2015 3:00 PM Medical Record Number: [id number] Patient Account Number: [id number] Date of Birth/Sex: 18-May-1925 (82 y.o. Male) Treating RN: Baruch Gouty, RN, BSN, Velva Harman Primary Care Physician: [name] Other Clinician: Referring Physician: [name] Treating Physician/Extender: BURNS III, Charlean Sanfilippo in Treatment: 2 Information Obtained from: Patient Chief Complaint Right anterior calf ulcer since December 2016. Electronic Signature(s) Signed: 12/05/2015 4:12:28 PM By: Loletha Grayer MD Entered By: Loletha Grayer on 12/05/2015 15:53:57 Arthur [name] ([id number]) -------------------------------------------------------------------------------- Debridement Details Patient Name: Arthur [name] Date of Service: 12/05/2015 3:00 PM Medical Record Number: [id number] Patient Account Number: [id number] Date of Birth/Sex: 10/26/1925 (82 y.o. Male) Treating RN: Baruch Gouty, RN, BSN, Velva Harman Primary Care Physician: [name] Other Clinician: Referring Physician: [name] Treating Physician/Extender: BURNS III, Charlean Sanfilippo in Treatment: 2 Debridement Performed for Wound #1 Right,Anterior Lower Leg Assessment: Performed By: Physician BURNS III, Teressa Senter., MD Debridement: Debridement Pre-procedure Yes Verification/Time Out Taken: Start Time: 15:10 Pain Control: Lidocaine 4% Topical Solution Level: Skin/Subcutaneous Tissue Total Area Debrided (L x 5 (cm) x 1.7 (cm) = 8.5 (cm) W): Tissue and other Viable, Non-Viable, Fat, Fibrin/Slough, Subcutaneous material debrided: Instrument: Curette Bleeding: Minimum Hemostasis Achieved: Pressure End Time: 15:15 Procedural Pain: 0 Post Procedural Pain: 0 Response to Treatment: Procedure was tolerated well Post Debridement Measurements of Total Wound Length: (cm) 5 Width: (cm)  1.7 Depth: (cm) 0.3 Volume: (cm) 2.003 Post Procedure Diagnosis Same as Pre-procedure Electronic Signature(s) Signed: 12/05/2015 4:12:28 PM By: Loletha Grayer MD Signed: 12/05/2015 4:59:08 PM By: Regan Lemming BSN, RN Entered By: Loletha Grayer on 12/05/2015 15:53:49 Arthur [name] ([id number]) -------------------------------------------------------------------------------- HPI Details Patient Name: Arthur [name] Date of Service: 12/05/2015 3:00 PM Medical Record Number: [id number] Patient Account Number: [id number] Date of Birth/Sex: 1925/05/20 (82 y.o. Male) Treating RN: Baruch Gouty, RN, BSN, [place] Primary Care Physician: [name] Other Clinician: Referring Physician: [name] Treating Physician/Extender: BURNS III, WALTER Weeks in Treatment: 2 History of Present Illness HPI Description: 82 year old with history of chronic venous insufficiency (status post endovenous ablation on the left), A. fib (on Coumadin), autoimmune hepatitis (on steroids), chronic kidney disease, and coronary artery disease. No history of diabetes. No known history of PAD. Status post right second toe amputation in 2014. Has a history of venous stasis ulcerations on the left, which healed with compression. Usually wears compression stockings, 15-20 mmHg. He traumatized his right anterior calf on a chair in mid December 2016. Subsequently hospitalized for pneumonia. Completed antibiotics. Performing dressing changes with silver alginate and using a Tubigrip for edema control. Received juxtalite but has not been wearing it. Ambulating per his baseline. No claudication or rest pain. Right ABI noncompressible. Left ABI 1.2. No fever or chills. Moderate drainage. Baseline edema. Scheduled to undergo endovenous ablation of right GSV in March 2017 by Dr. Kellie Simmering. Electronic Signature(s) Signed: 12/05/2015 4:12:28 PM By: Loletha Grayer MD Entered By: Loletha Grayer on 12/05/2015  15:55:26 Arthur [name] ([id number]) -------------------------------------------------------------------------------- Physical Exam Details Patient Name: Arthur [name] Date of Service: 12/05/2015 3:00 PM Medical Record Number: [id number] Patient Account Number: [id number] Date of Birth/Sex: November 06, 1925 (82 y.o. Male) Treating RN: Baruch Gouty, RN, BSN, Velva Harman Primary Care Physician: [name] Other Clinician: Referring Physician: [name] Treating Physician/Extender: BURNS III, WALTER Weeks in Treatment: 2 Constitutional . Pulse regular. Respirations normal and unlabored. Afebrile. . Notes Right anterior calf ulceration minimally improved. Full-thickness. No exposed  deep structures. No significant cellulitis. 2+ pitting edema. No palpable pedal pulses. Dopplerable DP and PT, both biphasic. ABI noncompressible. Left ABI 1.2. Electronic Signature(s) Signed: 12/05/2015 4:12:28 PM By: Loletha Grayer MD Entered By: Loletha Grayer on 12/05/2015 15:56:00 Arthur [name] ([id number]) -------------------------------------------------------------------------------- Physician Orders Details Patient Name: Arthur [name] Date of Service: 12/05/2015 3:00 PM Medical Record Number: [id number] Patient Account Number: [id number] Date of Birth/Sex: Aug 23, 1925 (81 y.o. Male) Treating RN: Baruch Gouty, RN, BSN, Velva Harman Primary Care Physician: [name] Other Clinician: Referring Physician: [name] Treating Physician/Extender: BURNS III, Charlean Sanfilippo in Treatment: 2 Verbal / Phone Orders: Yes Clinician: Afful, RN, BSN, Rita Read Back and Verified: Yes Diagnosis Coding Wound Cleansing Wound #1 Right,Anterior Lower Leg o Clean wound with Normal Saline. Anesthetic Wound #1 Right,Anterior Lower Leg o Topical Lidocaine 4% cream applied to wound bed prior to debridement Primary Wound Dressing Wound #1 Right,Anterior Lower Leg o Aquacel Ag Secondary Dressing Wound #1  Right,Anterior Lower Leg o Boardered Foam Dressing Dressing Change Frequency Wound #1 Right,Anterior Lower Leg o Change dressing every other day. Follow-up Appointments Wound #1 Right,Anterior Lower Leg o Return Appointment in 1 week. Edema Control Wound #1 Right,Anterior Lower Leg o Patient to wear own compression stockings o Patient to wear own Juxtalite/Juzo compression garment. - order juxtalite/juzo wrap o Elevate legs to the level of the heart and pump ankles as often as possible o Tubigrip Additional Orders / Instructions Wound #1 Right,Anterior Lower Leg o Increase protein intake. o Activity as tolerated KERBY, FOLLOWELL ([id number]) Electronic Signature(s) Signed: 12/05/2015 4:12:28 PM By: Loletha Grayer MD Signed: 12/05/2015 4:59:08 PM By: Regan Lemming BSN, RN Entered By: Regan Lemming on 12/05/2015 15:12:26 Arthur [name] ([id number]) -------------------------------------------------------------------------------- Problem List Details Patient Name: Arthur [name] Date of Service: 12/05/2015 3:00 PM Medical Record Number: [id number] Patient Account Number: [id number] Date of Birth/Sex: [birth year] (82 y.o. 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 Male) Treating RN: Baruch Gouty, RN, BSN, Velva Harman Primary Care Physician: [name] Other Clinician: Referring Physician: [name] Treating Physician/Extender: BURNS III, Charlean Sanfilippo in Treatment: 2 Subjective Chief Complaint Information obtained from Patient Right anterior calf ulcer since December 2016. History of Present Illness (HPI) 82 year old with history of chronic venous insufficiency (status post endovenous ablation on the left), A. fib (on Coumadin), autoimmune hepatitis (on steroids), chronic kidney disease, and coronary artery disease. No history of diabetes. No known history of PAD. Status post right second toe amputation in 2014. Has a history of venous stasis ulcerations on the left, which healed with compression. Usually wears compression stockings, 15-20 mmHg. He traumatized his right anterior calf on a chair in mid December 2016. Subsequently hospitalized for pneumonia. Completed antibiotics. Performing dressing changes with silver alginate and using a  Tubigrip for edema control. Received juxtalite but has not been wearing it. Ambulating per his baseline. No claudication or rest pain. Right ABI noncompressible. Left ABI 1.2. No fever or chills. Moderate drainage. Baseline edema. Scheduled to undergo endovenous ablation of right GSV in March 2017 by Dr. Kellie Simmering. Objective Constitutional Pulse regular. Respirations normal and unlabored. Afebrile. Vitals Time Taken: 3:03 PM, Height: 74 in, Weight: 204 lbs, BMI: 26.2, Temperature: 97.8 F, Pulse: 66 bpm, Respiratory Rate: 20 breaths/min, Blood Pressure: 148/66 mmHg. General Notes: Right anterior calf  ulceration minimally improved. Full-thickness. No exposed deep structures. No significant cellulitis. 2+ pitting edema. No palpable pedal pulses. Dopplerable DP and PT, both biphasic. ABI noncompressible. Left ABI 1.2. Integumentary (Hair, Skin) OMARION, AQUIRRE. ([id number]) Wound #1 status is Open. Original cause of wound was Trauma. The wound is located on the Right,Anterior Lower Leg. The wound measures 5cm length x 1.7cm width x 0.2cm depth; 6.676cm^2 area and 1.335cm^3 volume. The wound is limited to skin breakdown. There is no tunneling or undermining noted. There is a medium amount of serosanguineous drainage noted. The wound margin is distinct with the outline attached to the wound base. There is small (1-33%) pink granulation within the wound bed. There is a large (67-100%) amount of necrotic tissue within the wound bed including Adherent Slough. The periwound skin appearance exhibited: Localized Edema, Moist. The periwound skin appearance did not exhibit: Callus, Crepitus, Excoriation, Fluctuance, Friable, Induration, Rash, Scarring, Dry/Scaly, Maceration, Atrophie Blanche, Cyanosis, Ecchymosis, Hemosiderin Staining, Mottled, Pallor, Rubor, Erythema. Periwound temperature was noted as No Abnormality. 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 Chronic venous insufficiency. Procedures Wound #1 Wound #1 is a Venous Leg Ulcer located on the Right,Anterior Lower Leg . There was a Skin/Subcutaneous Tissue Debridement [id number]) debridement with total area of 8.5 sq cm performed by BURNS III, Teressa Senter., MD. with the following instrument(s): Curette to remove Viable and Non-Viable tissue/material  including Fat, Fibrin/Slough, and Subcutaneous after achieving pain control using Lidocaine 4% Topical Solution. A time out was conducted prior to the start of the procedure. A Minimum amount of bleeding was controlled with Pressure. The procedure was tolerated well with a pain level of 0 throughout and a pain level of 0 following the procedure. Post Debridement Measurements: 5cm length x 1.7cm width x 0.3cm depth; 2.003cm^3 volume. Post procedure Diagnosis Wound #1: Same as Pre-Procedure WISIN, BRANSTROM ([id number]) Plan Wound Cleansing: Wound #1 Right,Anterior Lower Leg: Clean wound with Normal Saline. Anesthetic: Wound #1 Right,Anterior Lower Leg: Topical Lidocaine 4% cream applied to wound bed prior to debridement Primary Wound Dressing: Wound #1 Right,Anterior Lower Leg: Aquacel Ag Secondary Dressing: Wound #1 Right,Anterior Lower Leg: Boardered Foam Dressing Dressing Change Frequency: Wound #1 Right,Anterior Lower Leg: Change dressing every other day. Follow-up Appointments: Wound #1 Right,Anterior Lower Leg: Return Appointment in 1 week. Edema Control: Wound #1 Right,Anterior Lower Leg: Patient to wear own compression stockings Patient to wear own Juxtalite/Juzo compression garment. - order juxtalite/juzo wrap Elevate legs to the level of the heart and pump ankles as often as possible Tubigrip Additional Orders / Instructions: Wound #1 Right,Anterior Lower Leg: Increase protein intake. Activity as tolerated Silver alginate. Edema control with juxtalite compression garment. Patient was instructed on how to apply it today. Encouraged frequent leg elevation  and ambulation. Scheduled to undergo endovenous ablation of right GSV in March 2017 by Dr. Kellie Simmering. Electronic Signature(s) Signed: 12/05/2015 4:12:28 PM By: Loletha Grayer MD Arthur [name] ([id number]) Entered By: Loletha Grayer on 12/05/2015 16:01:12 Arthur [name]  ([id number]) -------------------------------------------------------------------------------- SuperBill Details Patient Name: Arthur [name] Date of Service: 12/05/2015 Medical Record Number: [id number] Patient Account Number: [id number] Date of Birth/Sex: 11-16-1925 (82 y.o. 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 I48.2 Chronic atrial fibrillation I87.2 Venous insufficiency (chronic) (peripheral) Facility Procedures CPT4: Description Modifier Quantity Code [id number] - DEB SUBQ TISSUE 20 SQ CM/< 1 ICD-10 Description Diagnosis I83.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

## 2015-12-11 ENCOUNTER — Ambulatory Visit (INDEPENDENT_AMBULATORY_CARE_PROVIDER_SITE_OTHER): Payer: Medicare Other | Admitting: *Deleted

## 2015-12-11 DIAGNOSIS — I4891 Unspecified atrial fibrillation: Secondary | ICD-10-CM | POA: Diagnosis not present

## 2015-12-11 LAB — POCT INR: INR: 1.7

## 2015-12-12 ENCOUNTER — Encounter: Payer: Medicare Other | Admitting: Surgery

## 2015-12-12 DIAGNOSIS — I83212 Varicose veins of right lower extremity with both ulcer of calf and inflammation: Secondary | ICD-10-CM | POA: Diagnosis not present

## 2015-12-12 NOTE — Progress Notes (Addendum)
SHARIEFF, CORBELLO ([id number]) Visit Report for 12/12/2015 Arrival Information Details Patient Name: Arthur [name], Arthur [name] Date of Service: 12/12/2015 3:00 PM Medical Record Number: [id number] Patient Account Number: [id number] Date of Birth/Sex: June 06, 1925 (82 y.o. Male) Treating RN: Baruch Gouty, RN, BSN, Velva Harman Primary Care Physician: [name] Other Clinician: Referring Physician: [name] Treating Physician/Extender: Frann Rider in Treatment: 3 Visit Information History Since Last Visit Added or deleted any medications: No Patient Arrived: Ambulatory Any new allergies or adverse reactions: No Arrival Time: 14:55 Had a fall or experienced change in No Accompanied By: wife activities of daily living that may affect Transfer Assistance: None risk of falls: Patient Identification Verified: Yes Signs or symptoms of abuse/neglect since last No Secondary Verification Process Yes visito Completed: Hospitalized since last visit: No Patient Requires Transmission- No Has Dressing in Place as Prescribed: Yes Based Precautions: Has Compression in Place as Prescribed: Yes Patient Has Alerts: Yes Pain Present Now: No Patient Alerts: Patient on Blood Thinner coumadin Electronic Signature(s) Signed: 12/12/2015 2:57:09 PM By: Regan Lemming BSN, RN Previous Signature: 12/12/2015 2:55:56 PM Version By: Regan Lemming BSN, RN Entered By: Regan Lemming on 12/12/2015 14:57:09 Arthur [name] ([id number]) -------------------------------------------------------------------------------- Encounter Discharge Information Details Patient Name: Arthur [name] Date of Service: 12/12/2015 3:00 PM Medical Record Number: [id number] Patient Account Number: [id number] Date of Birth/Sex: December 14, 1924 (82 y.o. Male) Treating RN: Baruch Gouty, RN, BSN, Velva Harman Primary Care Physician: [name] Other Clinician: Referring Physician: [name] Treating Physician/Extender: Frann Rider in Treatment:  3 Encounter Discharge Information Items Discharge Pain Level: 0 Discharge Condition: Stable Ambulatory Status: Ambulatory Discharge Destination: Home Transportation: Private Auto Accompanied By: wife Schedule Follow-up Appointment: No Medication Reconciliation completed and provided to Patient/Care No Morgyn Marut: Provided on Clinical Summary of Care: 12/12/2015 Form Type Recipient Paper Patient [facility] Electronic Signature(s) Signed: 12/12/2015 3:28:53 PM By: Ruthine Dose Entered By: Ruthine Dose on 12/12/2015 15:28:53 Arthur [name] ([id number]) -------------------------------------------------------------------------------- Lower Extremity Assessment Details Patient Name: Arthur [name] Date of Service: 12/12/2015 3:00 PM Medical Record Number: [id number] Patient Account Number: [id number] Date of Birth/Sex: 1925/06/19 (82 y.o. Male) Treating RN: Baruch Gouty, RN, BSN, Velva Harman Primary Care Physician: [name] Other Clinician: Referring Physician: [name] Treating Physician/Extender: Frann Rider in Treatment: 3 Edema Assessment Assessed: [Left: No] [Right: No] E[Left: dema] [Right: :] Calf Left: Right: Point of Measurement: 34 cm From Medial Instep cm 38.6 cm Ankle Left: Right: Point of Measurement: 9 cm From Medial Instep cm 28.5 cm Vascular Assessment Claudication: Claudication Assessment [Right:None] Pulses: Posterior Tibial Dorsalis Pedis Palpable: [Right:Yes] Extremity colors, hair growth, and conditions: Extremity Color: [Right:Mottled] Hair Growth on Extremity: [Right:No] Temperature of Extremity: [Right:Warm] Capillary Refill: [Right:< 3 seconds] Toe Nail Assessment Left: Right: Thick: Yes Discolored: Yes Deformed: No Improper Length and Hygiene: Yes Electronic Signature(s) Signed: 12/12/2015 2:59:34 PM By: Regan Lemming BSN, RN Entered By: Regan Lemming on 12/12/2015 14:59:34 Arthur [name] ([name]  ([id number]) -------------------------------------------------------------------------------- Multi Wound Chart Details Patient Name: Arthur [name] Date of Service: 12/12/2015 3:00 PM Medical Record Number: [id number] Patient Account Number: [id number] Date of Birth/Sex: January 02, 1925 (82 y.o. Male) Treating RN: Baruch Gouty, RN, BSN, Velva Harman Primary Care Physician: [name] Other Clinician: Referring Physician: [name] Treating Physician/Extender: Frann Rider in Treatment: 3 Vital Signs Height(in): 74 Pulse(bpm): 55 Weight(lbs): 204 Blood Pressure 118/66 (mmHg): Body Mass Index(BMI): 26 Temperature(F): 98.0 Respiratory Rate 19 (breaths/min): Photos: [1:No Photos] [N/A:N/A] Wound Location: [1:Right Lower Leg - Anterior N/A] Wounding Event: [1:Trauma] [N/A:N/A] Primary Etiology: [1:Venous Leg  Ulcer] [N/A:N/A] Comorbid History: [1:Cataracts, Anemia, Arrhythmia, Coronary Artery Disease, Deep Vein Thrombosis, Hypertension, Myocardial Infarction, Osteomyelitis] [N/A:N/A] Date Acquired: [1:11/12/2015] [N/A:N/A] Weeks of Treatment: [1:3] [N/A:N/A] Wound Status: [1:Open] [N/A:N/A] Measurements L x W x D 5x1.7x0.2 [N/A:N/A] (cm) Area (cm) : [1:6.676] [N/A:N/A] Volume (cm) : [1:1.335] [N/A:N/A] % Reduction in Area: [1:31.60%] [N/A:N/A] % Reduction in Volume: 31.60% [N/A:N/A] Classification: [1:Full Thickness Without Exposed Support Structures] [N/A:N/A] Exudate Amount: [1:Medium] [N/A:N/A] Exudate Type: [1:Serosanguineous] [N/A:N/A] Exudate Color: [1:red, brown] [N/A:N/A] Wound Margin: [1:Distinct, outline attached N/A] Granulation Amount: [1:Small (1-33%)] [N/A:N/A] Granulation Quality: [1:Pink] [N/A:N/A] Necrotic Amount: [1:Large (67-100%)] [N/A:N/A] Exposed Structures: [N/A:N/A] Fascia: No Fat: No Tendon: No Muscle: No Joint: No Bone: No Limited to Skin Breakdown Epithelialization: None N/A N/A Periwound Skin Texture: Edema: Yes N/A N/A Excoriation:  No Induration: No Callus: No Crepitus: No Fluctuance: No Friable: No Rash: No Scarring: No Periwound Skin Maceration: Yes N/A N/A Moisture: Moist: Yes Dry/Scaly: No Periwound Skin Color: Atrophie Blanche: No N/A N/A Cyanosis: No Ecchymosis: No Erythema: No Hemosiderin Staining: No Mottled: No Pallor: No Rubor: No Temperature: No Abnormality N/A N/A Tenderness on No N/A N/A Palpation: Wound Preparation: Ulcer Cleansing: N/A N/A Rinsed/Irrigated with Saline Topical Anesthetic Applied: Other: lidocaine 4% Treatment Notes Electronic Signature(s) Signed: 12/12/2015 5:22:56 PM By: Regan Lemming BSN, RN Entered By: Regan Lemming on 12/12/2015 15:14:06 Arthur [name] ([id number]) -------------------------------------------------------------------------------- [place] Details Patient Name: Arthur [name] Date of Service: 12/12/2015 3:00 PM Medical Record Number: [id number] Patient Account Number: [id number] Date of Birth/Sex: 1925/03/24 (82 y.o. Male) Treating RN: Baruch Gouty, RN, BSN, Velva Harman Primary Care Physician: [name] Other Clinician: Referring Physician: [name] Treating Physician/Extender: Frann Rider in Treatment: 3 Active Inactive Electronic Signature(s) Signed: 01/17/2016 4:27:22 PM By: Regan Lemming BSN, RN Previous Signature: 12/12/2015 5:22:56 PM Version By: Regan Lemming BSN, RN Entered By: Regan Lemming on 01/17/2016 16:27:22 Arthur [name] ([id number]) -------------------------------------------------------------------------------- Pain Assessment Details Patient Name: Arthur [name] Date of Service: 12/12/2015 3:00 PM Medical Record Number: [id number] Patient Account Number: [id number] Date of Birth/Sex: 1925-03-13 (82 y.o. Male) Treating RN: Baruch Gouty, RN, BSN, Velva Harman Primary Care Physician: [name] Other Clinician: Referring Physician: [name] Treating Physician/Extender: Frann Rider in Treatment:  3 Active Problems Location of Pain Severity and Description of Pain Patient Has Paino No Site Locations Pain Management and Medication Current Pain Management: Electronic Signature(s) Signed: 12/12/2015 2:57:17 PM By: Regan Lemming BSN, RN Entered By: Regan Lemming on 12/12/2015 14:57:17 Arthur [name] ([id number]) -------------------------------------------------------------------------------- Patient/Caregiver Education Details Patient Name: Arthur [name] Date of Service: 12/12/2015 3:00 PM Medical Record Number: [id number] Patient Account Number: [id number] Date of Birth/Gender: 1924/12/19 (82 y.o. Male) Treating RN: Baruch Gouty, RN, BSN, Velva Harman Primary Care Physician: [name] Other Clinician: Referring Physician: [name] Treating Physician/Extender: Frann Rider in Treatment: 3 Education Assessment Education Provided To: Patient Education Topics Provided Basic Hygiene: Methods: Explain/Verbal Responses: State content correctly Welcome To [facility]: Methods: Explain/Verbal Responses: State content correctly Wound Debridement: Methods: Explain/Verbal Responses: State content correctly Wound/Skin Impairment: Methods: Explain/Verbal Responses: State content correctly Electronic Signature(s) Signed: 12/12/2015 5:22:56 PM By: Regan Lemming BSN, RN Entered By: Regan Lemming on 12/12/2015 15:18:56 Arthur [name] ([id number]) -------------------------------------------------------------------------------- Wound Assessment Details Patient Name: Arthur [name] Date of Service: 12/12/2015 3:00 PM Medical Record Number: [id number] Patient Account Number: [id number] Date of Birth/Sex: 01-26-1925 (82 y.o. Male) Treating RN: Baruch Gouty, RN, BSN, Velva Harman Primary Care Physician: [name] Other Clinician: Referring Physician: [name] Treating Physician/Extender: Frann Rider  in Treatment: 3 Wound Status Wound Number: 1 Primary Venous Leg  Ulcer Etiology: Wound Location: Right Lower Leg - Anterior Wound Open Wounding Event: Trauma Status: Date Acquired: 11/12/2015 Comorbid Cataracts, Anemia, Arrhythmia, Weeks Of Treatment: 3 History: Coronary Artery Disease, Deep Vein Clustered Wound: No Thrombosis, Hypertension, Myocardial Infarction, Osteomyelitis Photos Photo Uploaded By: Regan Lemming on 12/12/2015 16:19:00 Wound Measurements Length: (cm) 5 Width: (cm) 1.7 Depth: (cm) 0.2 Area: (cm) 6.676 Volume: (cm) 1.335 % Reduction in Area: 31.6% % Reduction in Volume: 31.6% Epithelialization: None Tunneling: No Undermining: No Wound Description Full Thickness Without Exposed Classification: Support Structures Wound Margin: Distinct, outline attached Exudate Medium Amount: Exudate Type: Serosanguineous Exudate Color: red, brown Foul Odor After Cleansing: No Wound Bed Granulation Amount: Small (1-33%) Exposed Structure KERBY, FOLLOWELL ([id number]) Granulation Quality: Pink Fascia Exposed: No Necrotic Amount: Large (67-100%) Fat Layer Exposed: No Necrotic Quality: Adherent Slough Tendon Exposed: No Muscle Exposed: No Joint Exposed: No Bone Exposed: No Limited to Skin Breakdown Periwound Skin Texture Texture Color No Abnormalities Noted: No No Abnormalities Noted: No Callus: No Atrophie Blanche: No Crepitus: No Cyanosis: No Excoriation: No Ecchymosis: No Fluctuance: No Erythema: No Friable: No Hemosiderin Staining: No Induration: No Mottled: No Localized Edema: Yes Pallor: No Rash: No Rubor: No Scarring: No Temperature / Pain Moisture Temperature: No Abnormality No Abnormalities Noted: No Dry / Scaly: No Maceration: Yes Moist: Yes Wound Preparation Ulcer Cleansing: Rinsed/Irrigated with Saline Topical Anesthetic Applied: Other: lidocaine 4%, Electronic Signature(s) Signed: 12/12/2015 5:22:56 PM By: Regan Lemming BSN, RN Entered By: Regan Lemming on 12/12/2015 15:06:21 Arthur [name] ([id number]) -------------------------------------------------------------------------------- Vitals Details Patient Name: Arthur [name] Date of Service: 12/12/2015 3:00 PM Medical Record Number: [id number] Patient Account Number: [id number] Date of Birth/Sex: [birth year] (82 y.o. Male) Treating RN: Baruch Gouty, RN, BSN, Velva Harman Primary Care Physician: [name] Other Clinician: Referring Physician: [name] Treating Physician/Extender: Frann Rider in Treatment: 3 Vital Signs Time Taken: 15:06 Temperature (F): 98.0 Height (in): 74 Pulse (bpm): 55 Weight (lbs): 204 Respiratory Rate (breaths/min): 19 Body Mass Index (BMI): 26.2 Blood Pressure (mmHg): 118/66 Reference Range: 80 - 120 mg / dl Electronic Signature(s) Signed: 12/12/2015 5:22:56 PM By: Regan Lemming BSN, RN Entered By: Regan Lemming on 12/12/2015 15:06:44

## 2015-12-13 ENCOUNTER — Encounter (HOSPITAL_BASED_OUTPATIENT_CLINIC_OR_DEPARTMENT_OTHER): Payer: Medicare Other | Attending: Internal Medicine

## 2015-12-13 NOTE — Progress Notes (Signed)
Arthur [name], Arthur [name] ([id number]) Visit Report for 12/12/2015 Chief Complaint Document Details Patient Name: Arthur [name], Arthur [name] Date of Service: 12/12/2015 3:00 PM Medical Record Number: [id number] Patient Account Number: [id number] Date of Birth/Sex: [birth year] (82 y.o. Male) Treating RN: Baruch Gouty, RN, BSN, Velva Harman Primary Care Physician: [name] Other Clinician: Referring Physician: [name] Treating Physician/Extender: Frann Rider in Treatment: 3 Information Obtained from: Patient Chief Complaint Right anterior calf ulcer since December 2016. Electronic Signature(s) Signed: 12/12/2015 3:29:23 PM By: Christin Fudge MD, FACS Entered By: Christin Fudge on 12/12/2015 15:29:23 Arthur [name] ([id number]) -------------------------------------------------------------------------------- Debridement Details Patient Name: Arthur [name] Date of Service: 12/12/2015 3:00 PM Medical Record Number: [id number] Patient Account Number: [id number] Date of Birth/Sex: 10-Sep-1925 (82 y.o. Male) Treating RN: Baruch Gouty, RN, BSN, Velva Harman Primary Care Physician: [name] Other Clinician: Referring Physician: [name] Treating Physician/Extender: Frann Rider in Treatment: 3 Debridement Performed for Wound #1 Right,Anterior Lower Leg Assessment: Performed By: Physician Christin Fudge, MD Debridement: Debridement Pre-procedure Yes Verification/Time Out Taken: Start Time: 15:14 Pain Control: Lidocaine 4% Topical Solution Level: Skin/Subcutaneous Tissue Total Area Debrided (L x 5 (cm) x 1.7 (cm) = 8.5 (cm) W): Tissue and other Non-Viable, Fibrin/Slough, Subcutaneous material debrided: Instrument: Curette Bleeding: Minimum Hemostasis Achieved: Pressure End Time: 15:19 Procedural Pain: 0 Post Procedural Pain: 0 Response to Treatment: Procedure was tolerated well Post Debridement Measurements of Total Wound Length: (cm) 5 Width: (cm) 1.7 Depth: (cm) 0.3 Volume: (cm)  2.003 Post Procedure Diagnosis Same as Pre-procedure Electronic Signature(s) Signed: 12/12/2015 3:29:16 PM By: Christin Fudge MD, FACS Signed: 12/12/2015 5:22:56 PM By: Regan Lemming BSN, RN Entered By: Christin Fudge on 12/12/2015 15:29:15 Arthur [name] ([id number]) -------------------------------------------------------------------------------- HPI Details Patient Name: Arthur [name] Date of Service: 12/12/2015 3:00 PM Medical Record Number: [id number] Patient Account Number: [id number] Date of Birth/Sex: September 09, 1925 (82 y.o. Male) Treating RN: Baruch Gouty, RN, BSN, [place] Primary Care Physician: [name] Other Clinician: Referring Physician: [name] Treating Physician/Extender: Frann Rider in Treatment: 3 History of Present Illness HPI Description: 82 year old with history of chronic venous insufficiency (status post endovenous ablation on the left), A. fib (on Coumadin), autoimmune hepatitis (on steroids), chronic kidney disease, and coronary artery disease. No history of diabetes. No known history of PAD. Status post right second toe amputation in 2014. Has a history of venous stasis ulcerations on the left, which healed with compression. Usually wears compression stockings, 15-20 mmHg. He traumatized his right anterior calf on a chair in mid December 2016. Subsequently hospitalized for pneumonia. Completed antibiotics. Performing dressing changes with silver alginate and using a Tubigrip for edema control. Received juxtalite but has not been wearing it. Ambulating per his baseline. No claudication or rest pain. Right ABI noncompressible. Left ABI 1.2. No fever or chills. Moderate drainage. Baseline edema. Scheduled to undergo endovenous ablation of right GSV in March 2017 by Dr. Kellie Simmering. 12/12/2015 -- he lives at this end of this week for [place] and will be there for the next 6 weeks. His wife has been doing his dressing. Electronic Signature(s) Signed: 12/12/2015  3:30:01 PM By: Christin Fudge MD, FACS Entered By: Christin Fudge on 12/12/2015 15:30:01 Arthur [name] ([id number]) -------------------------------------------------------------------------------- Physical Exam Details Patient Name: Arthur [name] Date of Service: 12/12/2015 3:00 PM Medical Record Number: [id number] Patient Account Number: [id number] Date of Birth/Sex: 07-08-1925 (82 y.o. Male) Treating RN: Baruch Gouty, RN, BSN, Velva Harman Primary Care Physician: [name] Other Clinician: Referring Physician: [name] Treating Physician/Extender: Frann Rider in Treatment: 3 Constitutional . Pulse  regular. Respirations normal and unlabored. Afebrile. . Eyes Nonicteric. Reactive to light. Ears, Nose, Mouth, and Throat Lips, teeth, and gums WNL.Marland Kitchen Moist mucosa without lesions. Neck supple and nontender. No palpable supraclavicular or cervical adenopathy. Normal sized without goiter. Respiratory WNL. No retractions.. Cardiovascular Pedal Pulses WNL. No clubbing, cyanosis or edema. Lymphatic No adneopathy. No adenopathy. No adenopathy. Musculoskeletal Adexa without tenderness or enlargement.. Digits and nails w/o clubbing, cyanosis, infection, petechiae, ischemia, or inflammatory conditions.. Integumentary (Hair, Skin) No suspicious lesions. No crepitus or fluctuance. No peri-wound warmth or erythema. No masses.Marland Kitchen Psychiatric Judgement and insight Intact.. No evidence of depression, anxiety, or agitation.. Notes the ulceration on the right anterior lateral cuff continues to have slough and this was sharply debrided down to subcutaneous tissue with a curet with brisk bleeding and this was controlled with pressure. Electronic Signature(s) Signed: 12/12/2015 3:30:38 PM By: Christin Fudge MD, FACS Entered By: Christin Fudge on 12/12/2015 15:30:37 Arthur [name] ([id number]) -------------------------------------------------------------------------------- Physician Orders  Details Patient Name: Arthur [name] Date of Service: 12/12/2015 3:00 PM Medical Record Number: [id number] Patient Account Number: [id number] Date of Birth/Sex: 23-Jun-1925 (82 y.o. Male) Treating RN: Baruch Gouty, RN, BSN, Velva Harman Primary Care Physician: [name] Other Clinician: Referring Physician: [name] Treating Physician/Extender: Frann Rider in Treatment: 3 Verbal / Phone Orders: Yes Clinician: Afful, RN, BSN, Rita Read Back and Verified: Yes Diagnosis Coding Wound Cleansing Wound #1 Right,Anterior Lower Leg o Clean wound with Normal Saline. Anesthetic Wound #1 Right,Anterior Lower Leg o Topical Lidocaine 4% cream applied to wound bed prior to debridement Primary Wound Dressing Wound #1 Right,Anterior Lower Leg o Aquacel Ag Secondary Dressing Wound #1 Right,Anterior Lower Leg o Boardered Foam Dressing Dressing Change Frequency Wound #1 Right,Anterior Lower Leg o Change dressing every other day. Follow-up Appointments Wound #1 Right,Anterior Lower Leg o Return Appointment in 1 week. Edema Control Wound #1 Right,Anterior Lower Leg o Patient to wear own compression stockings o Patient to wear own Juxtalite/Juzo compression garment. - order juxtalite/juzo wrap o Elevate legs to the level of the heart and pump ankles as often as possible o Tubigrip Additional Orders / Instructions Wound #1 Right,Anterior Lower Leg o Increase protein intake. o Activity as tolerated Arthur [name], Arthur [name] ([id number]) Electronic Signature(s) Signed: 12/12/2015 4:12:51 PM By: Christin Fudge MD, FACS Signed: 12/12/2015 5:22:56 PM By: Regan Lemming BSN, RN Entered By: Regan Lemming on 12/12/2015 15:17:25 Arthur [name] ([id number]) -------------------------------------------------------------------------------- Problem List Details Patient Name: Arthur [name] Date of Service: 12/12/2015 3:00 PM Medical Record Number: [id number] Patient Account Number:  [id number] Date of Birth/Sex: [birth year] (82 y.o. Male) Treating RN: Baruch Gouty, RN, BSN, Velva Harman Primary Care Physician: [name] Other Clinician: Referring Physician: [name] Treating Physician/Extender: Frann Rider in Treatment: 3 Active Problems ICD-10 Encounter Code Description Active Date Diagnosis I83.212 Varicose veins of right lower extremity with both ulcer of 11/21/2015 Yes calf and inflammation L97.212 Non-pressure chronic ulcer of right calf with fat layer 11/21/2015 Yes exposed I48.2 Chronic atrial fibrillation 11/21/2015 Yes I87.2 Venous insufficiency (chronic) (peripheral) 11/21/2015 Yes Inactive Problems Resolved Problems Electronic Signature(s) Signed: 12/12/2015 3:29:04 PM By: Christin Fudge MD, FACS Entered By: Christin Fudge on 12/12/2015 15:29:03 Arthur [name] ([id number]) -------------------------------------------------------------------------------- Progress Note Details Patient Name: Arthur [name] Date of Service: 12/12/2015 3:00 PM Medical Record Number: [id number] Patient Account Number: [id number] Date of Birth/Sex: 1925/09/08 (82 y.o. Male) Treating RN: Baruch Gouty, RN, BSN, Velva Harman Primary Care Physician: [name] Other Clinician: Referring Physician: [name] Treating Physician/Extender: Frann Rider in Treatment: 3  Subjective Chief Complaint Information obtained from Patient Right anterior calf ulcer since December 2016. History of Present Illness (HPI) 82 year old with history of chronic venous insufficiency (status post endovenous ablation on the left), A. fib (on Coumadin), autoimmune hepatitis (on steroids), chronic kidney disease, and coronary artery disease. No history of diabetes. No known history of PAD. Status post right second toe amputation in 2014. Has a history of venous stasis ulcerations on the left, which healed with compression. Usually wears compression stockings, 15-20 mmHg. He traumatized his right  anterior calf on a chair in mid December 2016. Subsequently hospitalized for pneumonia. Completed antibiotics. Performing dressing changes with silver alginate and using a Tubigrip for edema control. Received juxtalite but has not been wearing it. Ambulating per his baseline. No claudication or rest pain. Right ABI noncompressible. Left ABI 1.2. No fever or chills. Moderate drainage. Baseline edema. Scheduled to undergo endovenous ablation of right GSV in March 2017 by Dr. Kellie Simmering. 12/12/2015 -- he lives at this end of this week for [place] and will be there for the next 6 weeks. His wife has been doing his dressing. Objective Constitutional Pulse regular. Respirations normal and unlabored. Afebrile. Vitals Time Taken: 3:06 PM, Height: 74 in, Weight: 204 lbs, BMI: 26.2, Temperature: 98.0 F, Pulse: 55 bpm, Respiratory Rate: 19 breaths/min, Blood Pressure: 118/66 mmHg. Eyes Nonicteric. Reactive to light. Arthur [name], Arthur [name] ([id number]) Ears, Nose, Mouth, and Throat Lips, teeth, and gums WNL.Marland Kitchen Moist mucosa without lesions. Neck supple and nontender. No palpable supraclavicular or cervical adenopathy. Normal sized without goiter. Respiratory WNL. No retractions.. Cardiovascular Pedal Pulses WNL. No clubbing, cyanosis or edema. Lymphatic No adneopathy. No adenopathy. No adenopathy. Musculoskeletal Adexa without tenderness or enlargement.. Digits and nails w/o clubbing, cyanosis, infection, petechiae, ischemia, or inflammatory conditions.Marland Kitchen Psychiatric Judgement and insight Intact.. No evidence of depression, anxiety, or agitation.. General Notes: the ulceration on the right anterior lateral cuff continues to have slough and this was sharply debrided down to subcutaneous tissue with a curet with brisk bleeding and this was controlled with pressure. Integumentary (Hair, Skin) No suspicious lesions. No crepitus or fluctuance. No peri-wound warmth or erythema. No masses.. Wound #1 status is  Open. Original cause of wound was Trauma. The wound is located on the Right,Anterior Lower Leg. The wound measures 5cm length x 1.7cm width x 0.2cm depth; 6.676cm^2 area and 1.335cm^3 volume. The wound is limited to skin breakdown. There is no tunneling or undermining noted. There is a medium amount of serosanguineous drainage noted. The wound margin is distinct with the outline attached to the wound base. There is small (1-33%) pink granulation within the wound bed. There is a large (67-100%) amount of necrotic tissue within the wound bed including Adherent Slough. The periwound skin appearance exhibited: Localized Edema, Maceration, Moist. The periwound skin appearance did not exhibit: Callus, Crepitus, Excoriation, Fluctuance, Friable, Induration, Rash, Scarring, Dry/Scaly, Atrophie Blanche, Cyanosis, Ecchymosis, Hemosiderin Staining, Mottled, Pallor, Rubor, Erythema. Periwound temperature was noted as No Abnormality. Assessment Active Problems ICD-10 I83.212 - Varicose veins of right lower extremity with both ulcer of calf and inflammation Arthur [name], Arthur [name] ([id number]) [phone] - Non-pressure chronic ulcer of right calf with fat layer exposed I48.2 - Chronic atrial fibrillation I87.2 - Venous insufficiency (chronic) (peripheral) I have recommended we continue with silver alginate and the Juxtalite compression wrap system. Elevation and exercise of also been discussed with him. Since he is going to [facility] we have recommended he get in touch with the wound center as soon as possible and we  would be happy to send our notes to them. Procedures Wound #1 Wound #1 is a Venous Leg Ulcer located on the Right,Anterior Lower Leg . There was a Skin/Subcutaneous Tissue Debridement [id number]) debridement with total area of 8.5 sq cm performed by Christin Fudge, MD. with the following instrument(s): Curette to remove Non-Viable tissue/material including Fibrin/Slough and Subcutaneous after  achieving pain control using Lidocaine 4% Topical Solution. A time out was conducted prior to the start of the procedure. A Minimum amount of bleeding was controlled with Pressure. The procedure was tolerated well with a pain level of 0 throughout and a pain level of 0 following the procedure. Post Debridement Measurements: 5cm length x 1.7cm width x 0.3cm depth; 2.003cm^3 volume. Post procedure Diagnosis Wound #1: Same as Pre-Procedure Plan Wound Cleansing: Wound #1 Right,Anterior Lower Leg: Clean wound with Normal Saline. Anesthetic: Wound #1 Right,Anterior Lower Leg: Topical Lidocaine 4% cream applied to wound bed prior to debridement Primary Wound Dressing: Wound #1 Right,Anterior Lower Leg: Aquacel Ag Secondary Dressing: Wound #1 Right,Anterior Lower Leg: Boardered Foam Dressing Dressing Change Frequency: Arthur [name], Arthur [name] ([id number]) Wound #1 Right,Anterior Lower Leg: Change dressing every other day. Follow-up Appointments: Wound #1 Right,Anterior Lower Leg: Return Appointment in 1 week. Edema Control: Wound #1 Right,Anterior Lower Leg: Patient to wear own compression stockings Patient to wear own Juxtalite/Juzo compression garment. - order juxtalite/juzo wrap Elevate legs to the level of the heart and pump ankles as often as possible Tubigrip Additional Orders / Instructions: Wound #1 Right,Anterior Lower Leg: Increase protein intake. Activity as tolerated I have recommended we continue with silver alginate and the Juxtalite compression wrap system. Elevation and exercise of also been discussed with him. Since he is going to [facility] we have recommended he get in touch with the wound center as soon as possible and we would be happy to send our notes to them. Electronic Signature(s) Signed: 12/12/2015 3:31:51 PM By: Christin Fudge MD, FACS Entered By: Christin Fudge on 12/12/2015 15:31:51 Arthur [name]  ([id number]) -------------------------------------------------------------------------------- [place] Details Patient Name: Arthur [name] Date of Service: 12/12/2015 Medical Record Number: [id number] Patient Account Number: [id number] Date of Birth/Sex: 1925/04/20 (82 y.o. Male) Treating RN: Baruch Gouty, RN, BSN, Velva Harman Primary Care Physician: [name] Other Clinician: Referring Physician: [name] Treating Physician/Extender: Frann Rider in Treatment: 3 Diagnosis Coding ICD-10 Codes Code Description [phone] Varicose veins of right lower extremity with both ulcer of calf and inflammation L97.212 Non-pressure chronic ulcer of right calf with fat layer exposed I48.2 Chronic atrial fibrillation I87.2 Venous insufficiency (chronic) (peripheral) Facility Procedures CPT4: Description Modifier Quantity Code [id number] - DEB SUBQ TISSUE 20 SQ CM/< 1 ICD-10 Description Diagnosis I83.212 Varicose veins of right lower extremity with both ulcer of calf and inflammation L97.212 Non-pressure chronic ulcer of right calf  with fat layer exposed I48.2 Chronic atrial fibrillation I87.2 Venous insufficiency (chronic) (peripheral) Physician Procedures CPT4: Description Modifier Quantity Code [id number] - WC PHYS SUBQ TISS 20 SQ CM 1 ICD-10 Description Diagnosis I83.212 Varicose 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

## 2015-12-14 ENCOUNTER — Other Ambulatory Visit: Payer: Self-pay | Admitting: Cardiovascular Disease

## 2015-12-18 IMAGING — CR DG CHEST 2V
2 series · 2 of 2 positions shown · non-contrast
Comparison: 05/14/2015.  05/04/2015.

CLINICAL DATA: Hemoptysis.

EXAM:
CHEST  2 VIEW

[view not recorded (1 of 2)]
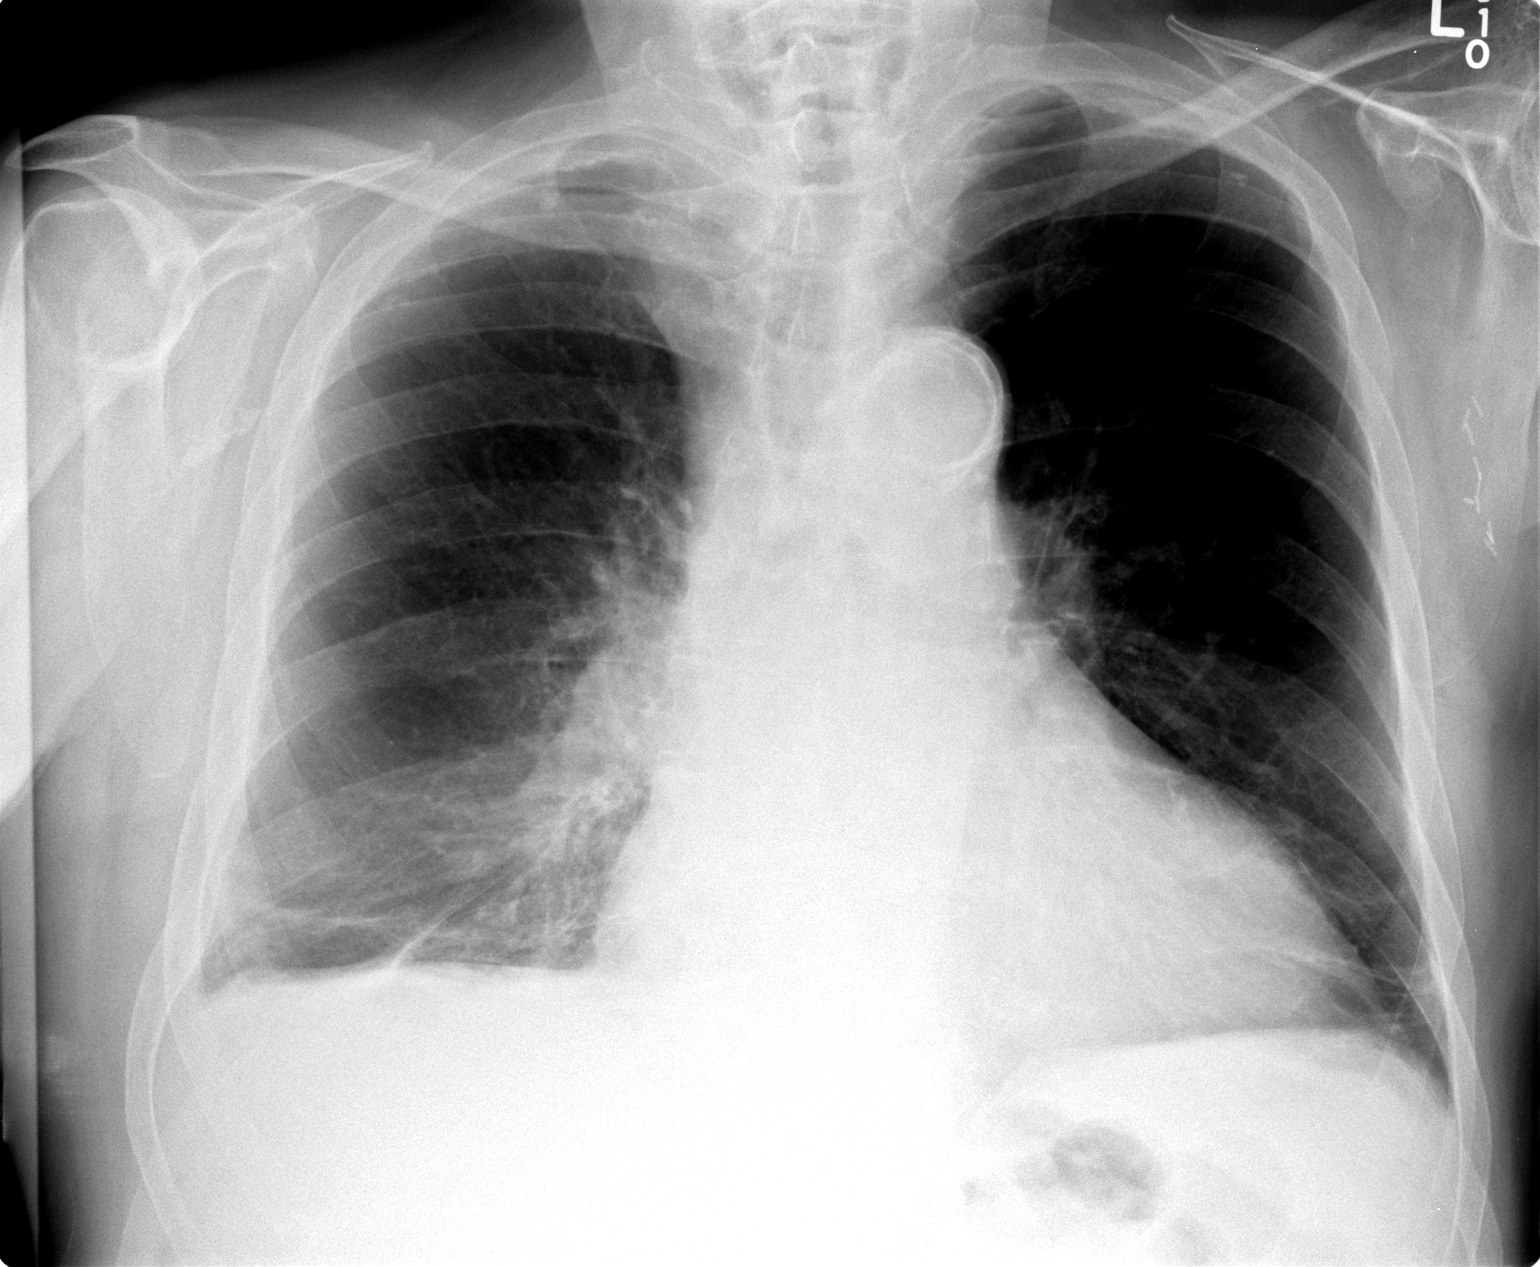

[view not recorded (2 of 2)]
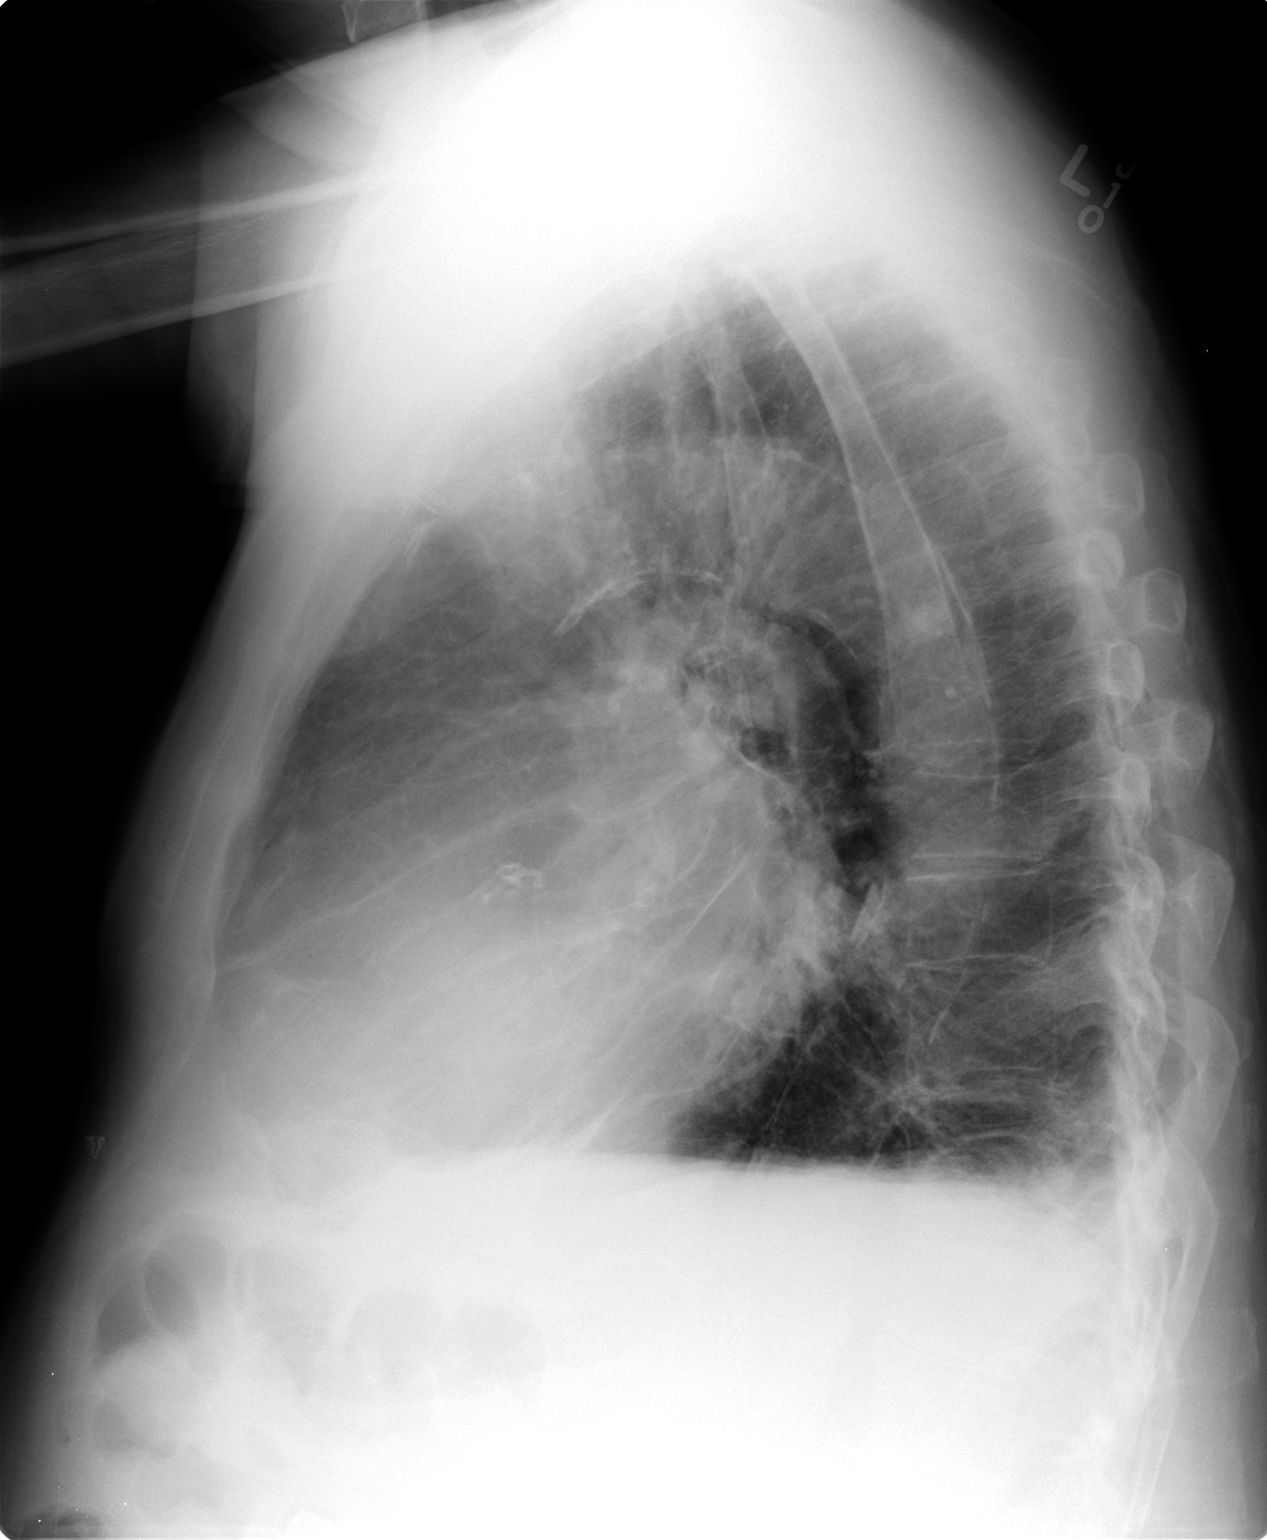

[2 of 2 positions shown; findings below may reference images not displayed]

FINDINGS: Mediastinum and hilar structures are normal. Partial clearing of
right base infiltrate. Persistent bibasilar subsegmental atelectasis
and/or pleural parenchymal scarring. Stable cardiomegaly. No
pulmonary venous congestion. No acute bony abnormality.
IMPRESSION: Continued clearing of right base infiltrate. Persistent bibasilar
subsegmental atelectasis and/or pleural parenchymal scarring.

## 2015-12-24 ENCOUNTER — Encounter: Payer: Self-pay | Admitting: Internal Medicine

## 2015-12-25 ENCOUNTER — Ambulatory Visit (INDEPENDENT_AMBULATORY_CARE_PROVIDER_SITE_OTHER): Payer: Medicare Other | Admitting: Cardiovascular Disease

## 2015-12-25 LAB — PROTIME-INR: INR: 2.3 — AB (ref 0.9–1.1)

## 2016-01-15 ENCOUNTER — Encounter: Payer: Self-pay | Admitting: Vascular Surgery

## 2016-01-17 ENCOUNTER — Ambulatory Visit (INDEPENDENT_AMBULATORY_CARE_PROVIDER_SITE_OTHER): Payer: Medicare Other | Admitting: *Deleted

## 2016-01-17 DIAGNOSIS — I4891 Unspecified atrial fibrillation: Secondary | ICD-10-CM | POA: Diagnosis not present

## 2016-01-17 LAB — POCT INR: INR: 2

## 2016-01-18 ENCOUNTER — Telehealth: Payer: Self-pay | Admitting: *Deleted

## 2016-01-18 NOTE — Telephone Encounter (Signed)
Arthur [name] is scheduled for endovenous laser ablation of right greater saphenous vein on 01-21-2016 by Tinnie Gens MD.  Arthur [name] states he has been in [place] for some time and is just arriving home.  He states he has a right leg (pretibial) ulceration that was cared for and followed while he was in [place].  Arthur [name] wants to know if he needs to be seen by the [facility] prior to his procedure on 01-21-2016.  Arthur [name] states the ulcer has no odor, discharge, or inflammation and "seems to be healing."  Advised him there was no need to be seen in the [facility] prior to his procedure on Monday, January 21, 2016.  Arthur [name] verbalized understanding and confirmed appointment for laser ablation procedure on 01-21-2016.

## 2016-01-21 ENCOUNTER — Other Ambulatory Visit: Payer: Medicare Other | Admitting: Vascular Surgery

## 2016-01-21 ENCOUNTER — Encounter: Payer: Self-pay | Admitting: Vascular Surgery

## 2016-01-21 ENCOUNTER — Telehealth: Payer: Self-pay | Admitting: Cardiovascular Disease

## 2016-01-21 NOTE — Telephone Encounter (Signed)
New message      Request for surgical clearance:  What type of surgery is being performed? Laser ablation When is this surgery scheduled? 01-28-16 Are there any medications that need to be held prior to surgery and how long? Hold coumadin or bridge to lovenox prior to procedure Name of physician performing surgery?  Dr Tinnie Gens 1. What is your office phone and fax number? Fax [phone]

## 2016-01-22 NOTE — Telephone Encounter (Signed)
I left a voicemail for Arthur [name] in regards to this information.  I will also fax a copy of this note to [phone].

## 2016-01-22 NOTE — Telephone Encounter (Signed)
He should hold warfarin 5 days prior to the surgery. Lovenox bridge is not required.

## 2016-01-22 NOTE — Telephone Encounter (Signed)
Please call,she is waiting to hear from you.

## 2016-01-28 ENCOUNTER — Encounter (HOSPITAL_COMMUNITY): Payer: Medicare Other

## 2016-01-28 ENCOUNTER — Ambulatory Visit (INDEPENDENT_AMBULATORY_CARE_PROVIDER_SITE_OTHER): Payer: Medicare Other | Admitting: Vascular Surgery

## 2016-01-28 ENCOUNTER — Ambulatory Visit: Payer: Medicare Other | Admitting: Vascular Surgery

## 2016-01-28 ENCOUNTER — Encounter: Payer: Self-pay | Admitting: Vascular Surgery

## 2016-01-28 VITALS — BP 124/63 | HR 72 | Temp 98.0°F | Resp 18 | Ht 74.0 in | Wt 200.0 lb

## 2016-01-28 DIAGNOSIS — I83891 Varicose veins of right lower extremities with other complications: Secondary | ICD-10-CM | POA: Diagnosis not present

## 2016-01-28 NOTE — Progress Notes (Signed)
Laser Ablation Procedure    Date: 01/28/2016   Arthur [name] DOB:05-10-1925  Consent signed: Yes    Surgeon:  Dr. Nelda Severe. Kellie Simmering  Procedure: Laser Ablation: right Greater Saphenous Vein  BP 124/63 mmHg  Pulse 72  Temp(Src) 98 F (36.7 C)  Resp 18  Ht 6\' 2"  (1.88 m)  Wt 200 lb (90.719 kg)  BMI 25.67 kg/m2  SpO2 99%  Tumescent Anesthesia: 200 cc 0.9% NaCl with 50 cc Lidocaine HCL with 1% Epi and 15 cc 8.4% NaHCO3  Local Anesthesia: 3 cc Lidocaine HCL and NaHCO3 (ratio 2:1)  Pulsed Mode: 15 watts, 554ms delay, 1.0 duration  Total Energy: 1196              Total Pulses: 80               Total Time: 1:19    Patient tolerated procedure well  Notes:   Description of Procedure:  After marking the course of the secondary varicosities, the patient was placed on the operating table in the supine position, and the right leg was prepped and draped in sterile fashion.   Local anesthetic was administered and under ultrasound guidance the saphenous vein was accessed with a micro needle and guide wire; then the mirco puncture sheath was placed.  A guide wire was inserted to the perferator vein, followed by a 5 french sheath.  The position of the sheath and then the laser fiber was confirmed using the ultrasound.  Tumescent anesthesia was administered along the course of the saphenous vein using ultrasound guidance. The patient was placed in Trendelenburg position and protective laser glasses were placed on patient and staff, and the laser was fired at 15 watts continuous mode advancing 1-12mm/second for a total of 1196 joules.     Steri strips were applied to the stab wounds and ABD pads and thigh high compression stockings were applied.  Ace wrap bandages were applied over the phlebectomy sites and at the top of the perferator. Blood loss was less than 15 cc.  The patient ambulated out of the operating room having tolerated the procedure well.

## 2016-01-28 NOTE — Progress Notes (Signed)
Subjective:     Patient ID: Arthur [name], male   DOB: [birth year], 82 y.o.   MRN: [id number]  HPI this 82 year old male had laser ablation of the right great saphenous vein from the knee to the mid to proximal thigh where it communicated with a large incompetent perforating branch which is where the reflux originated. The great saphenous vein proximal to this up to the saphenofemoral junction was small in caliber with no reflux. The laser fiber was not placed in the deep venous system. A total of 1195 J of energy was utilized. This was performed under local tumescent anesthesia. He tolerated the procedure well.   Review of Systems     Objective:   Physical Exam BP 124/63 mmHg  Pulse 72  Temp(Src) 98 F (36.7 C)  Resp 18  Ht 6\' 2"  (1.88 m)  Wt 200 lb (90.719 kg)  BMI 25.67 kg/m2  SpO2 99%       Assessment:     Well-tolerated laser ablation right great saphenous vein from knee to mid to proximal thigh where it communicated with large incompetent perforating branch. Patient tolerated procedure well.    Plan:     Return in 1 week for venous duplex exam to confirm closure right great saphenous vein up to mid to proximal thigh and to visualize incompetent perforating branch. Patient will resume Coumadin tomorrow and report to Coumadin clinic next week for check of PT-INR

## 2016-01-29 ENCOUNTER — Telehealth: Payer: Self-pay | Admitting: *Deleted

## 2016-01-29 NOTE — Telephone Encounter (Signed)
Pt doing well. Having a little discomfort above the knee in the thigh. Discussed managing the ace wraps with him and his wife. He is taking Tylenol and will call his dr about the restart of the Coumadin. Otherwise, following all instructions.

## 2016-01-30 ENCOUNTER — Encounter: Payer: Self-pay | Admitting: Vascular Surgery

## 2016-01-30 ENCOUNTER — Other Ambulatory Visit: Payer: Self-pay

## 2016-01-30 ENCOUNTER — Telehealth: Payer: Self-pay

## 2016-01-30 DIAGNOSIS — R7989 Other specified abnormal findings of blood chemistry: Secondary | ICD-10-CM

## 2016-01-30 DIAGNOSIS — R945 Abnormal results of liver function studies: Principal | ICD-10-CM

## 2016-01-30 NOTE — Telephone Encounter (Signed)
-----   Message from Algernon Huxley, RN sent at 12/03/2015  9:43 AM EST ----- Regarding: LFT's Needs LFT in March, order in epic.

## 2016-01-30 NOTE — Telephone Encounter (Signed)
Pts wife aware and states she will tell pt to come for labs.

## 2016-01-31 ENCOUNTER — Other Ambulatory Visit (INDEPENDENT_AMBULATORY_CARE_PROVIDER_SITE_OTHER): Payer: Medicare Other

## 2016-01-31 ENCOUNTER — Emergency Department (HOSPITAL_COMMUNITY): Payer: Medicare Other

## 2016-01-31 ENCOUNTER — Other Ambulatory Visit: Payer: Self-pay | Admitting: Internal Medicine

## 2016-01-31 ENCOUNTER — Inpatient Hospital Stay (HOSPITAL_COMMUNITY)
Admission: EM | Admit: 2016-01-31 | Discharge: 2016-02-04 | DRG: 062 | Disposition: A | Discharge status: Home / Self Care | Payer: Medicare Other | Attending: Neurology | Admitting: Neurology

## 2016-01-31 ENCOUNTER — Encounter (HOSPITAL_COMMUNITY): Payer: Self-pay | Admitting: Emergency Medicine

## 2016-01-31 DIAGNOSIS — M6289 Other specified disorders of muscle: Secondary | ICD-10-CM | POA: Diagnosis not present

## 2016-01-31 DIAGNOSIS — Z955 Presence of coronary angioplasty implant and graft: Secondary | ICD-10-CM | POA: Diagnosis not present

## 2016-01-31 DIAGNOSIS — I639 Cerebral infarction, unspecified: Secondary | ICD-10-CM | POA: Diagnosis present

## 2016-01-31 DIAGNOSIS — R945 Abnormal results of liver function studies: Principal | ICD-10-CM

## 2016-01-31 DIAGNOSIS — R7989 Other specified abnormal findings of blood chemistry: Secondary | ICD-10-CM

## 2016-01-31 DIAGNOSIS — I513 Intracardiac thrombosis, not elsewhere classified: Secondary | ICD-10-CM | POA: Diagnosis present

## 2016-01-31 DIAGNOSIS — I2510 Atherosclerotic heart disease of native coronary artery without angina pectoris: Secondary | ICD-10-CM | POA: Diagnosis present

## 2016-01-31 DIAGNOSIS — Z89421 Acquired absence of other right toe(s): Secondary | ICD-10-CM

## 2016-01-31 DIAGNOSIS — G8194 Hemiplegia, unspecified affecting left nondominant side: Secondary | ICD-10-CM | POA: Diagnosis present

## 2016-01-31 DIAGNOSIS — R791 Abnormal coagulation profile: Secondary | ICD-10-CM | POA: Diagnosis present

## 2016-01-31 DIAGNOSIS — I712 Thoracic aortic aneurysm, without rupture: Secondary | ICD-10-CM | POA: Diagnosis present

## 2016-01-31 DIAGNOSIS — Z87891 Personal history of nicotine dependence: Secondary | ICD-10-CM | POA: Diagnosis not present

## 2016-01-31 DIAGNOSIS — I10 Essential (primary) hypertension: Secondary | ICD-10-CM | POA: Diagnosis not present

## 2016-01-31 DIAGNOSIS — I480 Paroxysmal atrial fibrillation: Secondary | ICD-10-CM | POA: Diagnosis not present

## 2016-01-31 DIAGNOSIS — S7011XA Contusion of right thigh, initial encounter: Secondary | ICD-10-CM | POA: Diagnosis present

## 2016-01-31 DIAGNOSIS — R29705 NIHSS score 5: Secondary | ICD-10-CM | POA: Diagnosis present

## 2016-01-31 DIAGNOSIS — N289 Disorder of kidney and ureter, unspecified: Secondary | ICD-10-CM | POA: Diagnosis present

## 2016-01-31 DIAGNOSIS — Z880 Allergy status to penicillin: Secondary | ICD-10-CM

## 2016-01-31 DIAGNOSIS — I482 Chronic atrial fibrillation: Secondary | ICD-10-CM | POA: Diagnosis present

## 2016-01-31 DIAGNOSIS — D329 Benign neoplasm of meninges, unspecified: Secondary | ICD-10-CM | POA: Diagnosis present

## 2016-01-31 DIAGNOSIS — I77810 Thoracic aortic ectasia: Secondary | ICD-10-CM

## 2016-01-31 DIAGNOSIS — E785 Hyperlipidemia, unspecified: Secondary | ICD-10-CM | POA: Diagnosis present

## 2016-01-31 DIAGNOSIS — I63412 Cerebral infarction due to embolism of left middle cerebral artery: Secondary | ICD-10-CM | POA: Diagnosis present

## 2016-01-31 DIAGNOSIS — I119 Hypertensive heart disease without heart failure: Secondary | ICD-10-CM | POA: Diagnosis present

## 2016-01-31 DIAGNOSIS — Z9282 Status post administration of tPA (rtPA) in a different facility within the last 24 hours prior to admission to current facility: Secondary | ICD-10-CM

## 2016-01-31 DIAGNOSIS — I252 Old myocardial infarction: Secondary | ICD-10-CM | POA: Diagnosis not present

## 2016-01-31 DIAGNOSIS — R471 Dysarthria and anarthria: Secondary | ICD-10-CM | POA: Diagnosis present

## 2016-01-31 DIAGNOSIS — R29810 Facial weakness: Secondary | ICD-10-CM | POA: Diagnosis present

## 2016-01-31 DIAGNOSIS — Z8673 Personal history of transient ischemic attack (TIA), and cerebral infarction without residual deficits: Secondary | ICD-10-CM

## 2016-01-31 DIAGNOSIS — I6789 Other cerebrovascular disease: Secondary | ICD-10-CM | POA: Diagnosis not present

## 2016-01-31 LAB — I-STAT TROPONIN, ED: TROPONIN I, POC: 0.03 ng/mL (ref 0.00–0.08)

## 2016-01-31 LAB — CBC
HEMATOCRIT: 43.9 % (ref 39.0–52.0)
Hemoglobin: 14.3 g/dL (ref 13.0–17.0)
MCH: 32.2 pg (ref 26.0–34.0)
MCHC: 32.6 g/dL (ref 30.0–36.0)
MCV: 98.9 fL (ref 78.0–100.0)
Platelets: 133 10*3/uL — ABNORMAL LOW (ref 150–400)
RBC: 4.44 MIL/uL (ref 4.22–5.81)
RDW: 14.9 % (ref 11.5–15.5)
WBC: 9 10*3/uL (ref 4.0–10.5)

## 2016-01-31 LAB — HEPATIC FUNCTION PANEL
ALT: 110 U/L — ABNORMAL HIGH (ref 0–53)
AST: 79 U/L — AB (ref 0–37)
Albumin: 3.6 g/dL (ref 3.5–5.2)
Alkaline Phosphatase: 73 U/L (ref 39–117)
BILIRUBIN TOTAL: 0.9 mg/dL (ref 0.2–1.2)
Bilirubin, Direct: 0.3 mg/dL (ref 0.0–0.3)
Total Protein: 7 g/dL (ref 6.0–8.3)

## 2016-01-31 LAB — I-STAT CHEM 8, ED
BUN: 25 mg/dL — ABNORMAL HIGH (ref 6–20)
Calcium, Ion: 1.06 mmol/L — ABNORMAL LOW (ref 1.13–1.30)
Chloride: 102 mmol/L (ref 101–111)
Creatinine, Ser: 1.3 mg/dL — ABNORMAL HIGH (ref 0.61–1.24)
Glucose, Bld: 137 mg/dL — ABNORMAL HIGH (ref 65–99)
HEMATOCRIT: 47 % (ref 39.0–52.0)
HEMOGLOBIN: 16 g/dL (ref 13.0–17.0)
POTASSIUM: 4.6 mmol/L (ref 3.5–5.1)
Sodium: 138 mmol/L (ref 135–145)
TCO2: 25 mmol/L (ref 0–100)

## 2016-01-31 LAB — COMPREHENSIVE METABOLIC PANEL
ALT: 118 U/L — ABNORMAL HIGH (ref 17–63)
ANION GAP: 12 (ref 5–15)
AST: 91 U/L — AB (ref 15–41)
Albumin: 3.4 g/dL — ABNORMAL LOW (ref 3.5–5.0)
Alkaline Phosphatase: 84 U/L (ref 38–126)
BUN: 20 mg/dL (ref 6–20)
CHLORIDE: 102 mmol/L (ref 101–111)
CO2: 24 mmol/L (ref 22–32)
Calcium: 9.1 mg/dL (ref 8.9–10.3)
Creatinine, Ser: 1.42 mg/dL — ABNORMAL HIGH (ref 0.61–1.24)
GFR, EST AFRICAN AMERICAN: 49 mL/min — AB (ref >60)
GFR, EST NON AFRICAN AMERICAN: 43 mL/min — AB (ref >60)
Glucose, Bld: 142 mg/dL — ABNORMAL HIGH (ref 65–99)
POTASSIUM: 4.7 mmol/L (ref 3.5–5.1)
Sodium: 138 mmol/L (ref 135–145)
TOTAL PROTEIN: 6.9 g/dL (ref 6.5–8.1)
Total Bilirubin: 0.8 mg/dL (ref 0.3–1.2)

## 2016-01-31 LAB — PROTIME-INR
INR: 1.18 (ref 0.00–1.49)
Prothrombin Time: 15.2 seconds (ref 11.6–15.2)

## 2016-01-31 LAB — DIFFERENTIAL
BASOS ABS: 0 10*3/uL (ref 0.0–0.1)
BASOS PCT: 0 %
EOS ABS: 0.1 10*3/uL (ref 0.0–0.7)
EOS PCT: 1 %
Lymphocytes Relative: 53 %
Lymphs Abs: 4.8 10*3/uL — ABNORMAL HIGH (ref 0.7–4.0)
MONOS PCT: 13 %
Monocytes Absolute: 1.1 10*3/uL — ABNORMAL HIGH (ref 0.1–1.0)
NEUTROS PCT: 33 %
Neutro Abs: 3 10*3/uL (ref 1.7–7.7)

## 2016-01-31 LAB — ETHANOL

## 2016-01-31 LAB — APTT: APTT: 29 s (ref 24–37)

## 2016-01-31 LAB — CBG MONITORING, ED: GLUCOSE-CAPILLARY: 108 mg/dL — AB (ref 65–99)

## 2016-01-31 MED ORDER — ALTEPLASE (STROKE) FULL DOSE INFUSION
0.9000 mg/kg | Freq: Once | INTRAVENOUS | Status: AC
Start: 2016-01-31 — End: 2016-01-31
  Administered 2016-01-31: 83 mg via INTRAVENOUS
  Filled 2016-01-31: qty 100

## 2016-01-31 MED ORDER — LABETALOL HCL 5 MG/ML IV SOLN
5.0000 mg | Freq: Once | INTRAVENOUS | Status: AC
  Administered 2016-01-31: 5 mg via INTRAVENOUS
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
## 2016-01-31 NOTE — Consult Note (Signed)
Neurology Consultation Reason for Consult: code stroke Referring Physician: ed attending  CC: dysarthria  History is obtained from: patient  HPI: Arthur [name] is a 82 y.o. male with hx of afib who was off coumadin for a laser procedure on his right leg (compressible side).  He restarted his coumadin yesterday and has had 2 doses since.  Today just after 7:30 he had trouble using his right hand and then acute onset of dysarthria noticed by him and his wife.  He came into the ED as code stroke and on admission he told us he had had a superficial laser surgery on the right leg that involved a minor superficial incision.  I decided at the CT scanner to treat with ivTPA and at that time his NIHSS = 5 for a mild drift of right arm (1), right leg (2), dysarthria (1) and mild but visible right facial droop (1).  Because he had taken his coumadin x 2 doses we waited for the INR which took at least 30 minutes to be resulted.  By that time all his deficits had resolved save for a moderate degree of dysarthria making his NIHSS =1; because of this improvement I had a new discussion regarding the risks vs benefits of ivTPA with the patient.  By that time his daughter and wife had reached the room and became involved in the discussion, asking multiple questions.  April, the RR nurse and myself did our best to answer all the questions, constantly emphasizing the importance of making a quick decision in order to lower the overall risk of intracranial bleeding.  We also were very honest with family in telling them that while the surgery in the leg was minor and could be compressed in the case of bleeding due to tPA, the bleeding could be significant and disabling.  We also made it clear that there could be lethal intracranial bleeding.  After a rather long discussion fueled by patient and family, he finally made the decision to be treated given that his dysarthria was clearly disabling. The combination of the prolonged  time to INR result and the family-initiated long discussion of risks and benefits delayed tPA administration significantly although it was clearly given within the time window.  Apparently there was also a long delay at the time the patient was picked up by the ambulance, the family told us in the room.   LKW: 7:30pm tpa given?: yes  ROS: A 14 point ROS was performed and is negative except as noted in the HPI.   Past Medical History  Diagnosis Date  . Coronary artery disease     MI, atherectomy 1993  . Hyperlipidemia   . Arrhythmia     1995  . Atrial fibrillation ([place])   . History of transient ischemic attack (TIA) 2005    double vision  . Hypertension   . History of gallstones   . Myocardial infarct ([place]) 12/98    inferior  . Eczema   . Elevated LFTs   . Skin cancer   . DDD (degenerative disc disease)   . Pneumonia     long time ago  . Varicose veins     Family History  Problem Relation Age of Onset  . Heart attack Mother 61    MI  . Pneumonia Father     Social History:  reports that he quit smoking about 48 years ago. His smoking use included Cigars and Pipe. He has never used smokeless tobacco. He reports that he  drinks alcohol. He reports that he does not use illicit drugs.  Exam: Current vital signs: There were no vitals taken for this visit. Vital signs in last 24 hours:     Physical Exam  Constitutional: Appears well-developed and well-nourished. There is a bandage around the right calf Psych: Affect appropriate to situation Eyes: No scleral injection HENT: No OP obstrucion Head: Normocephalic.  Cardiovascular: Normal rate and regular rhythm.  Respiratory: Effort normal and breath sounds normal to anterior ascultation GI: Soft.  No distension. There is no tenderness.  Skin: WDI  Neuro: Mental Status: Patient is awake, alert, oriented to person, place, month, year, and situation Patient is able to give a clear and coherent history.  This patient has  moderate dysarthria No signs of aphasia or neglect Cranial Nerves: II: Visual Fields are full. Pupils are equal, round, and reactive to light.   III,IV, VI: EOMI without ptosis or diploplia.  V: Facial sensation is symmetric to temperature VII: Facial movement is symmetric, save for a minor but visible right sided facial asymmetry VIII: hearing is intact to voice X: Uvula elevates symmetrically XI: Shoulder shrug is symmetric. XII: tongue is midline without atrophy or fasciculations.  Motor: Tone is normal. Bulk is normal. 5/5 strength was present in all four extremities. Originally he was scored a 2 for right leg but then went to 0 - His arm also went to 0 from a 1 originally Sensory: Sensation is symmetric to light touch and temperature in the arms and legs. Save for decreased sensation in a stocking dysatribution Deep Tendon Reflexes: Unable to obtain Plantars: Toes are silent Cerebellar: FNF and HKS are intact bilaterally    I have reviewed labs in epic and the results pertinent to this consultation are: INR 1.18  I have reviewed the images obtained: CT head was negative. This was d/w radiology  Impression: acute cardioembolic stroke in setting of subtherapeutic INR.  This patient has a minor right leg surgery that needs to be watched an possibly compressed tonight.  This must be monitored throughout the night for signs of bleeding as this could become a disabling bleed.  Will need post-tPA care and repeat CT tomorrow.  Case will be signed out to stroke team in am.  Recommendations: 1) as above

## 2016-01-31 NOTE — ED Notes (Signed)
Report attempted, RN to call back. 

## 2016-01-31 NOTE — ED Notes (Signed)
Pt brought to ED from home for stroke sx. Pt last seen well by wife on dinner at [place], at 2030 wife states she was trying to talk to the pt and pt wasn't able to talk to her back. Pt arrive by [facility] with right side weakness, some drooping and drooling on his right side of the face, weakness on right arm and right leg, pt having some dysarthria. Pt stopped taking his Coumadin for surgery last Monday on his right leg and restart taking his coumadin yesterday. Pt AOx4, family member at the bedside.

## 2016-02-01 ENCOUNTER — Inpatient Hospital Stay (HOSPITAL_COMMUNITY): Payer: Medicare Other

## 2016-02-01 ENCOUNTER — Other Ambulatory Visit: Payer: Self-pay

## 2016-02-01 DIAGNOSIS — I480 Paroxysmal atrial fibrillation: Secondary | ICD-10-CM

## 2016-02-01 DIAGNOSIS — M6289 Other specified disorders of muscle: Secondary | ICD-10-CM

## 2016-02-01 DIAGNOSIS — I639 Cerebral infarction, unspecified: Secondary | ICD-10-CM | POA: Diagnosis present

## 2016-02-01 DIAGNOSIS — I10 Essential (primary) hypertension: Secondary | ICD-10-CM

## 2016-02-01 DIAGNOSIS — I63412 Cerebral infarction due to embolism of left middle cerebral artery: Secondary | ICD-10-CM

## 2016-02-01 DIAGNOSIS — I6789 Other cerebrovascular disease: Secondary | ICD-10-CM

## 2016-02-01 DIAGNOSIS — E785 Hyperlipidemia, unspecified: Secondary | ICD-10-CM

## 2016-02-01 DIAGNOSIS — K754 Autoimmune hepatitis: Secondary | ICD-10-CM

## 2016-02-01 DIAGNOSIS — N183 Chronic kidney disease, stage 3 (moderate): Secondary | ICD-10-CM

## 2016-02-01 LAB — URINALYSIS, ROUTINE W REFLEX MICROSCOPIC
BILIRUBIN URINE: NEGATIVE
Glucose, UA: NEGATIVE mg/dL
Hgb urine dipstick: NEGATIVE
KETONES UR: NEGATIVE mg/dL
LEUKOCYTES UA: NEGATIVE
NITRITE: NEGATIVE
PH: 6 (ref 5.0–8.0)
Protein, ur: NEGATIVE mg/dL
SPECIFIC GRAVITY, URINE: 1.01 (ref 1.005–1.030)

## 2016-02-01 LAB — CBC WITH DIFFERENTIAL/PLATELET
BASOS ABS: 0 10*3/uL (ref 0.0–0.1)
Basophils Relative: 0 %
EOS ABS: 0 10*3/uL (ref 0.0–0.7)
Eosinophils Relative: 0 %
HCT: 45.4 % (ref 39.0–52.0)
HEMOGLOBIN: 14.8 g/dL (ref 13.0–17.0)
LYMPHS PCT: 47 %
Lymphs Abs: 6.7 10*3/uL — ABNORMAL HIGH (ref 0.7–4.0)
MCH: 31.9 pg (ref 26.0–34.0)
MCHC: 32.6 g/dL (ref 30.0–36.0)
MCV: 97.8 fL (ref 78.0–100.0)
MONOS PCT: 15 %
Monocytes Absolute: 2.1 10*3/uL — ABNORMAL HIGH (ref 0.1–1.0)
NEUTROS ABS: 5.4 10*3/uL (ref 1.7–7.7)
NEUTROS PCT: 38 %
PLATELETS: 127 10*3/uL — AB (ref 150–400)
RBC: 4.64 MIL/uL (ref 4.22–5.81)
RDW: 15.1 % (ref 11.5–15.5)
WBC: 14.2 10*3/uL — AB (ref 4.0–10.5)

## 2016-02-01 LAB — RAPID URINE DRUG SCREEN, HOSP PERFORMED
AMPHETAMINES: NOT DETECTED
Barbiturates: NOT DETECTED
Benzodiazepines: NOT DETECTED
Cocaine: NOT DETECTED
OPIATES: NOT DETECTED
TETRAHYDROCANNABINOL: NOT DETECTED

## 2016-02-01 LAB — ECHOCARDIOGRAM COMPLETE
Height: 74 in
Weight: 3268.8 oz

## 2016-02-01 LAB — LIPID PANEL
CHOL/HDL RATIO: 3.5 ratio
Cholesterol: 161 mg/dL (ref 0–200)
HDL: 46 mg/dL (ref >40)
LDL CALC: 103 mg/dL — AB (ref 0–99)
Triglycerides: 58 mg/dL (ref <150)
VLDL: 12 mg/dL (ref 0–40)

## 2016-02-01 LAB — MRSA PCR SCREENING: MRSA by PCR: NEGATIVE

## 2016-02-01 LAB — GLUCOSE, CAPILLARY: Glucose-Capillary: 76 mg/dL (ref 65–99)

## 2016-02-01 MED ORDER — LABETALOL HCL 5 MG/ML IV SOLN: 10.0000 mg | INTRAVENOUS | Status: DC | PRN

## 2016-02-01 MED ORDER — ALBUTEROL SULFATE HFA 108 (90 BASE) MCG/ACT IN AERS: 2.0000 | INHALATION_SPRAY | Freq: Four times a day (QID) | RESPIRATORY_TRACT | Status: DC | PRN

## 2016-02-01 MED ORDER — PREDNISONE 10 MG PO TABS
10.0000 mg | ORAL_TABLET | Freq: Every day | ORAL | Status: DC
  Administered 2016-02-02 – 2016-02-04 (×3): 10 mg via ORAL
  Filled 2016-02-01 (×4): qty 1

## 2016-02-01 MED ORDER — PANTOPRAZOLE SODIUM 40 MG IV SOLR: 40.0000 mg | Freq: Every day | INTRAVENOUS | Status: DC

## 2016-02-01 MED ORDER — STROKE: EARLY STAGES OF RECOVERY BOOK
Freq: Once | Status: AC
  Administered 2016-02-01: 08:00:00
  Filled 2016-02-01: qty 1

## 2016-02-01 MED ORDER — ATORVASTATIN CALCIUM 20 MG PO TABS
20.0000 mg | ORAL_TABLET | Freq: Every day | ORAL | Status: DC
  Administered 2016-02-01 – 2016-02-03 (×2): 20 mg via ORAL
  Filled 2016-02-01 (×3): qty 1

## 2016-02-01 MED ORDER — ACETAMINOPHEN 325 MG PO TABS: 650.0000 mg | ORAL_TABLET | ORAL | Status: DC | PRN

## 2016-02-01 MED ORDER — SODIUM CHLORIDE 0.9 % IV SOLN
INTRAVENOUS | Status: DC
Start: 2016-02-01 — End: 2016-02-01
  Administered 2016-02-01: 02:00:00 via INTRAVENOUS

## 2016-02-01 MED ORDER — ACETAMINOPHEN 650 MG RE SUPP: 650.0000 mg | RECTAL | Status: DC | PRN

## 2016-02-01 MED ORDER — METOPROLOL TARTRATE 25 MG PO TABS
25.0000 mg | ORAL_TABLET | Freq: Every day | ORAL | Status: DC
  Administered 2016-02-01 – 2016-02-04 (×4): 25 mg via ORAL
  Filled 2016-02-01 (×5): qty 1

## 2016-02-01 MED ORDER — ALTEPLASE (STROKE) FULL DOSE INFUSION: 0.9000 mg/kg | Freq: Once | INTRAVENOUS | Status: DC

## 2016-02-01 MED ORDER — SODIUM CHLORIDE 0.9 % IV SOLN: 50.0000 mL | Freq: Once | INTRAVENOUS | Status: DC

## 2016-02-01 MED ORDER — STROKE: EARLY STAGES OF RECOVERY BOOK
Freq: Once | Status: AC
  Administered 2016-02-01: 02:00:00
  Filled 2016-02-01: qty 1

## 2016-02-01 MED ORDER — PANTOPRAZOLE SODIUM 40 MG IV SOLR
40.0000 mg | Freq: Every day | INTRAVENOUS | Status: DC
  Administered 2016-02-01 (×2): 40 mg via INTRAVENOUS
  Filled 2016-02-01 (×3): qty 40

## 2016-02-01 MED ORDER — NICARDIPINE HCL IN NACL 20-0.86 MG/200ML-% IV SOLN: 5.0000 mg/h | INTRAVENOUS | Status: DC

## 2016-02-01 MED ORDER — HYDRALAZINE HCL 20 MG/ML IJ SOLN
10.0000 mg | INTRAMUSCULAR | Status: DC | PRN
  Administered 2016-02-01 (×3): 10 mg via INTRAVENOUS
  Filled 2016-02-01 (×3): qty 1

## 2016-02-01 NOTE — Progress Notes (Signed)
VASCULAR LAB PRELIMINARY  PRELIMINARY  PRELIMINARY  PRELIMINARY  Carotid duplex completed.     Bilateral:  1-39% ICA stenosis.  Vertebral artery flow is antegrade.     Prateek Knipple, RVT, RDMS 02/01/2016, 11:34 AM

## 2016-02-01 NOTE — Care Management Note (Signed)
Case Management Note  Patient Details  Name: Arthur [name] MRN: [id number] Date of Birth: 1925-06-30  Subjective/Objective:  Pt admitted with stroke                  Action/Plan:  Pt is independent from home with wife.  CM will continue to monitor for disposition needs   Expected Discharge Date:                  Expected Discharge Plan:  Home/Self Care  In-House Referral:     Discharge planning Services  CM Consult  Post Acute Care Choice:    Choice offered to:     DME Arranged:    DME Agency:     HH Arranged:    HH Agency:     Status of Service:  In process, will continue to follow  Medicare Important Message Given:    Date Medicare IM Given:    Medicare IM give by:    Date Additional Medicare IM Given:    Additional Medicare Important Message give by:     If discussed at [place] of Stay Meetings, dates discussed:    Additional Comments:  Maryclare Labrador, RN 02/01/2016, 11:32 AM

## 2016-02-01 NOTE — Procedures (Signed)
Objective Swallowing Evaluation: Type of Study: MBS-Modified Barium Swallow Study  Patient Details  Name: Arthur [name] MRN: [id number] Date of Birth: [birth year]  Today's Date: 02/01/2016 Time: SLP Start Time (ACUTE ONLY): 1300-SLP Stop Time (ACUTE ONLY): 1330 SLP Time Calculation (min) (ACUTE ONLY): 30 min  Past Medical History:  Past Medical History  Diagnosis Date  . Coronary artery disease     MI, atherectomy 1993  . Hyperlipidemia   . Arrhythmia     1995  . Atrial fibrillation ([place])   . History of transient ischemic attack (TIA) 2005    double vision  . Hypertension   . History of gallstones   . Myocardial infarct ([place]) 12/98    inferior  . Eczema   . Elevated LFTs   . Skin cancer   . DDD (degenerative disc disease)   . Pneumonia     long time ago  . Varicose veins    Past Surgical History:  Past Surgical History  Procedure Laterality Date  . Cardiac catheterization  04/08/99    LAD 70% INTER 40-50% RCA 30-40%  . Appendectomy    . Coronary angioplasty  04/08/99    LAD  . Coronary angioplasty with stent placement  04/08/99    LAD  . Coronary angioplasty  10/17/97    RCA   . Melanoma excision  2013    lt arm  . Tonsillectomy    . Amputation Right 05/10/2013    Procedure: RIGHT SECOND TOE AMPUTATION;  Surgeon: Hessie Dibble, MD;  Location: [facility];  Service: Orthopedics;  Laterality: Right;  . Melanoma excision Left 08/23/2013    Procedure: MELANOMA EXCISION;  Surgeon: Pedro Earls, MD;  Location: [facility];  Service: General;  Laterality: Left;  . Video bronchoscopy Bilateral 05/07/2015    Procedure: VIDEO BRONCHOSCOPY WITHOUT FLUORO;  Surgeon: Rigoberto Noel, MD;  Location: [facility];  Service: Cardiopulmonary;  Laterality: Bilateral;   HPI: 82 year old male admitted 01/31/16 due to right side weakness and dysarthria. CT negative, MRI pending. PMH significant for CAD, HLD, TIA, MI. MBS recommended following BSE.  Subjective: Pt seen in  radiology for MBS  Assessment / Plan / Recommendation  CHL IP CLINICAL IMPRESSIONS 02/01/2016  Therapy Diagnosis Mild pharyngeal phase dysphagia;Mild oral phase dysphagia  Clinical Impression Pt presents with mild oropharyngeal dysphagia, characterized by slight right anterior leakage of thin liquid, and one episode of aspiration after large consecutive boluses of thin liquid via straw. Immediate cough response noted elicited. Puree, solid and barium tablet cleared the oropharynx without difficulty or delay. Smaller, individual sips of thin liquid via cup or straw were tolerated without penetration or aspiration. Recommend regular solids and thin liquids via cup or straw, taking small bites and sips. ST to follow for diet tolerance and education.  Impact on safety and function Mild aspiration risk      CHL IP TREATMENT RECOMMENDATION 02/01/2016  Treatment Recommendations Therapy as outlined in treatment plan below     Prognosis 02/01/2016  Prognosis for Safe Diet Advancement Good  Barriers to Reach Goals --  Barriers/Prognosis Comment --    CHL IP DIET RECOMMENDATION 02/01/2016  SLP Diet Recommendations Regular solids;Thin liquid  Liquid Administration via Cup;Straw  Medication Administration Whole meds with liquid  Compensations Minimize environmental distractions;Slow rate;Small sips/bites  Postural Changes Seated upright at 90 degrees      CHL IP OTHER RECOMMENDATIONS 02/01/2016  Recommended Consults --  Oral Care Recommendations Oral care BID  Other Recommendations Have  oral suction available      CHL IP FOLLOW UP RECOMMENDATIONS 02/01/2016  Follow up Recommendations Outpatient SLP      CHL IP FREQUENCY AND DURATION 02/01/2016  Speech Therapy Frequency (ACUTE ONLY) min 1 x/week  Treatment Duration 2 weeks           CHL IP ORAL PHASE 02/01/2016  Oral Phase Impaired  Oral - Pudding Teaspoon --  Oral - Pudding Cup --  Oral - Honey Teaspoon --  Oral - Honey Cup --  Oral -  Nectar Teaspoon --  Oral - Nectar Cup --  Oral - Nectar Straw --  Oral - Thin Teaspoon --  Oral - Thin Cup Right anterior bolus loss  Oral - Thin Straw --  Oral - Puree --  Oral - Mech Soft --  Oral - Regular --  Oral - Multi-Consistency --  Oral - Pill --  Oral Phase - Comment --    CHL IP PHARYNGEAL PHASE 02/01/2016  Pharyngeal Phase Impaired  Pharyngeal- Pudding Teaspoon --  Pharyngeal --  Pharyngeal- Pudding Cup --  Pharyngeal --  Pharyngeal- Honey Teaspoon --  Pharyngeal --  Pharyngeal- Honey Cup --  Pharyngeal --  Pharyngeal- Nectar Teaspoon --  Pharyngeal --  Pharyngeal- Nectar Cup --  Pharyngeal --  Pharyngeal- Nectar Straw --  Pharyngeal --  Pharyngeal- Thin Teaspoon --  Pharyngeal --  Pharyngeal- Thin Cup --  Pharyngeal --  Pharyngeal- Thin Straw Delayed swallow initiation-vallecula;Penetration/Apiration after swallow  Pharyngeal Material enters airway, passes BELOW cords then ejected out  Pharyngeal- Puree --  Pharyngeal --  Pharyngeal- Mechanical Soft --  Pharyngeal --  Pharyngeal- Regular --  Pharyngeal --  Pharyngeal- Multi-consistency --  Pharyngeal --  Pharyngeal- Pill --  Pharyngeal --  Pharyngeal Comment --     CHL IP CERVICAL ESOPHAGEAL PHASE 02/01/2016  Cervical Esophageal Phase WFL  Pudding Teaspoon --  Pudding Cup --  Honey Teaspoon --  Honey Cup --  Nectar Teaspoon --  Nectar Cup --  Nectar Straw --  Thin Teaspoon --  Thin Cup --  Thin Straw --  Puree --  Mechanical Soft --  Regular --  Multi-consistency --  Pill --  Cervical Esophageal Comment --    No flowsheet data found.  Shonna Chock 02/01/2016, 2:26 PM   Celia B. Quentin Ore [facility], [place] [phone]

## 2016-02-01 NOTE — Progress Notes (Signed)
PT Cancellation Note  Patient Details Name: Arthur [name] MRN: [id number] DOB: 29-Dec-1924   Cancelled Treatment:    Reason Eval/Treat Not Completed: Patient not medically ready; patient still on strict bedrest and new order for start tomorrow 02/02/16.  Will attempt again tomorrow.   Reginia Naas 02/01/2016, 8:54 AM  Magda Kiel, [place] 02/01/2016

## 2016-02-01 NOTE — Progress Notes (Signed)
  Echocardiogram 2D Echocardiogram has been performed.  Darlina Sicilian M 02/01/2016, 2:41 PM

## 2016-02-01 NOTE — Progress Notes (Signed)
EEG completed, results pending. 

## 2016-02-01 NOTE — Progress Notes (Signed)
STROKE TEAM PROGRESS NOTE   HISTORY OF PRESENT ILLNESS Arthur [name] is a 82 y.o. male with hx of afib who was off coumadin for a laser procedure on his right leg (compressible side). He restarted his coumadin yesterday and has had 2 doses since. Today just after 7:30 he had trouble using his right hand and then acute onset of dysarthria noticed by him and his wife. He came into the ED as code stroke and on admission he told us he had had a superficial laser surgery on the right leg that involved a minor superficial incision. I decided at the CT scanner to treat with ivTPA and at that time his NIHSS = 5 for a mild drift of right arm (1), right leg (2), dysarthria (1) and mild but visible right facial droop (1). Because he had taken his coumadin x 2 doses we waited for the INR which took at least 30 minutes to be resulted. By that time all his deficits had resolved save for a moderate degree of dysarthria making his NIHSS =1; because of this improvement I had a new discussion regarding the risks vs benefits of ivTPA with the patient. By that time his daughter and wife had reached the room and became involved in the discussion, asking multiple questions. April, the RR nurse and myself did our best to answer all the questions, constantly emphasizing the importance of making a quick decision in order to lower the overall risk of intracranial bleeding. We also were very honest with family in telling them that while the surgery in the leg was minor and could be compressed in the case of bleeding due to tPA, the bleeding could be significant and disabling. We also made it clear that there could be lethal intracranial bleeding. After a rather long discussion fueled by patient and family, he finally made the decision to be treated given that his dysarthria was clearly disabling. The combination of the prolonged time to INR result and the family-initiated long discussion of risks and benefits delayed tPA  administration significantly although it was clearly given within the time window. Apparently there was also a long delay at the time the patient was picked up by the ambulance, the family told us in the room.   LKW: 7:30pm tpa given?: yes   SUBJECTIVE (INTERVAL HISTORY) His two daughters and son in law are at the bedside.  Overall he feels his condition is rapidly improving. He still has dysarthria but no hand weakness anymore. He passed swallow and on diet. Will do MRI at 24hr after tPA.   OBJECTIVE Temp:  [97.2 F (36.2 C)-98 F (36.7 C)] 97.3 F (36.3 C) (03/17 0319) Pulse Rate:  [48-151] 68 (03/17 0730) Cardiac Rhythm:  [-] Atrial fibrillation (03/17 0400) Resp:  [15-28] 18 (03/17 0730) BP: (148-207)/(65-107) 177/92 mmHg (03/17 0730) SpO2:  [93 %-99 %] 96 % (03/17 0730) Weight:  [92.67 kg (204 lb 4.8 oz)] 92.67 kg (204 lb 4.8 oz) (03/16 2210)  CBC:  Recent Labs Lab 01/31/16 2151 01/31/16 2157  WBC 9.0  --   NEUTROABS 3.0  --   HGB 14.3 16.0  HCT 43.9 47.0  MCV 98.9  --   PLT 133*  --     Basic Metabolic Panel:  Recent Labs Lab 01/31/16 2151 01/31/16 2157  NA 138 138  K 4.7 4.6  CL 102 102  CO2 24  --   GLUCOSE 142* 137*  BUN 20 25*  CREATININE 1.42* 1.30*  CALCIUM 9.1  --  Lipid Panel:    Component Value Date/Time   CHOL 161 02/01/2016 0400   TRIG 58 02/01/2016 0400   HDL 46 02/01/2016 0400   CHOLHDL 3.5 02/01/2016 0400   VLDL 12 02/01/2016 0400   LDLCALC 103* 02/01/2016 0400   HgbA1c:  Lab Results  Component Value Date   HGBA1C 7.2* 03/07/2013   Urine Drug Screen:    Component Value Date/Time   LABOPIA NONE DETECTED 02/01/2016 0011   COCAINSCRNUR NONE DETECTED 02/01/2016 0011   LABBENZ NONE DETECTED 02/01/2016 0011   AMPHETMU NONE DETECTED 02/01/2016 0011   THCU NONE DETECTED 02/01/2016 0011   LABBARB NONE DETECTED 02/01/2016 0011      IMAGING  Ct Head Wo Contrast 01/31/2016   1. No acute findings.  No intracranial hemorrhage or  edema.  2. 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  3. Meningioma overlying the left occipital lobe, stable in size, now partially calcified.   EEG - This awake and drowsy EEG is within normal limits for age. A normal EEG does not exclude a clinical diagnosis of epilepsy. Clinical correlation is advised.  2D echo - Left ventricle: The cavity size was normal. Wall thickness was  increased in a pattern of mild LVH. There was mild focal basal  hypertrophy of the septum. Systolic function was normal. The  estimated ejection fraction was in the range of 55% to 60%. Wall  motion was normal; there were no regional wall motion  abnormalities. - Aortic valve: Valve mobility was restricted. There was mild  stenosis. There was mild regurgitation. - Aortic root: The aortic root was mildly dilated. - Ascending aorta: The ascending aorta was mildly dilated. - Mitral valve: Calcified annulus. There was mild regurgitation. - Left atrium: The atrium was severely dilated. - Right atrium: The atrium was moderately dilated. - Pulmonary arteries: Systolic pressure was mildly increased. PA  peak pressure: 36 mm Hg (S). Impressions: - Normal LV systolic function; biatrial enlargement; calcified  aortic valve with mild AS (mean gradient 17 mmHg); mild AI; mild  MR; trace TR with mildly elevated pulmonary pressure; mildly  dilated aortic root, ascending aorta and aortic arch; suggest CTA  or MRA to further assess.  MRI and MRA head and neck - pending   PHYSICAL EXAM  Temp:  [97.2 F (36.2 C)-98 F (36.7 C)] 97.9 F (36.6 C) (03/17 1549) Pulse Rate:  [48-151] 88 (03/17 1700) Resp:  [15-36] 31 (03/17 1700) BP: (148-207)/(65-112) 177/90 mmHg (03/17 1700) SpO2:  [93 %-100 %] 98 % (03/17 1700) Weight:  [204 lb 4.8 oz (92.67 kg)] 204 lb 4.8 oz (92.67 kg) (03/16 2210)  General - Well nourished, well developed, in no apparent distress.  Ophthalmologic - Fundi  not visualized due to eye movement.  Cardiovascular - Regular rate and rhythm.  Mental Status -  Level of arousal and orientation to time, place, and person were intact. Language including expression, naming, comprehension was assessed and found intact, however, difficulty with repetition with dysarthria. Fund of Knowledge was assessed and was intact.  Cranial Nerves II - XII - II - Visual field intact OU. III, IV, VI - Extraocular movements intact. V - Facial sensation intact bilaterally. VII - right nasolabial fold flattening. VIII - Hearing & vestibular intact bilaterally. X - Palate elevates symmetrically. XI - Chin turning & shoulder shrug intact bilaterally. XII - Tongue protrusion intact.  Motor Strength - The patient's strength was normal in all extremities and pronator drift was absent. Right leg dressing. Bulk  was normal and fasciculations were absent.   Motor Tone - Muscle tone was assessed at the neck and appendages and was normal.  Reflexes - The patient's reflexes were 1+ in all extremities and he had no pathological reflexes.  Sensory - Light touch, temperature/pinprick were assessed and were symmetrical.    Coordination - The patient had normal movements in the hands with no ataxia or dysmetria.  Tremor was absent.  Gait and Station - not tested due to right leg wound and safety concerns after tPA.   ASSESSMENT/PLAN Mr. SHAKA HUNEKE is a 82 y.o. male with history of coronary artery disease, hyperlipidemia, atrial fibrillation, TIA, hypertension, and  previous MI,  presenting with dysarthria and right arm difficulties. The patient received IV TPA 83 mg on Thursday, 07/02/2016 at 2300 hrs.  Stroke: possible Left brain infarct likely embolic secondary to atrial fibrillation with subtherapeutic INR. Pt was off coumadin for right leg procedure without lovenox bridge, just restarted coumadin for a day. When pt on coumadin, his INR stable within 2-3. Therefore, will  continue his coumadin with ASA bridge this time.  Resultant  Dysarthria and right facial asymmetry  MRI  pending  MRA head and neck pending  2D Echo EF 55-60%  LDL 103  HgbA1c pending  VTE prophylaxis - SCDs  Diet NPO time specified  warfarin daily prior to admission, now on No antithrombotic - secondary to TPA. Plan for coumadin with ASA bridge 24h after tPA if no bleeding on MRI.  Patient counseled to be compliant with his antithrombotic medications  Ongoing aggressive stroke risk factor management  Therapy recommendations: Pending  Disposition: Pending  afib on coumadin   subtherapeutic INR due to off coumadin for procedure  Stable INR at home with coumadin  Will restart coumadin with ASA bridge 24 h after tPA if no bleeding  Resume metoprolol for rate control  Hypertension  Blood pressure somewhat high  Resumed home metoprolol  Permissive hypertension (OK if < 220/120) but gradually normalize in 5-7 days  Hyperlipidemia  Home meds: No lipid lowering medications prior to admission  LDL 103, goal < 70  Add Lipitor 20 mg daily  Continue statin at discharge  Other Stroke Risk Factors  Advanced age  Cigarette smoker, quit smoking 48 years ago.  ETOH use  Hx stroke/TIA  Coronary artery disease   Other Active Problems  Renal insufficiency Cre 1.30  Hospital day # 1  This patient is critically ill due to left brain stroke s/p tPA and at significant risk of neurological worsening, death form recurrent stroke and hemorrhagic transformation and cerebral edema. This patient's care requires constant monitoring of vital signs, hemodynamics, respiratory and cardiac monitoring, review of multiple databases, neurological assessment, discussion with family, other specialists and medical decision making of high complexity. I had long discussion with her daughters and answered all their questions to their satisfaction. I spent 45 minutes of neurocritical care  time in the care of this patient.  Rosalin Hawking, MD PhD Stroke Neurology 02/01/2016 6:58 PM    To contact Stroke Continuity provider, please refer to [email or web address] After hours, contact General Neurology

## 2016-02-01 NOTE — Procedures (Signed)
ELECTROENCEPHALOGRAM REPORT  Date of Study: 02/01/2016  Patient's Name: Arthur [name] MRN: [id number] Date of Birth: January 25, 1925  Referring Provider: Dr. Rosalin Hawking  Clinical History: This is an 82 year old man with an episode of right hand weakness and dysarthria.   Medications: acetaminophen (TYLENOL) tablet 650 mg hydrALAZINE (APRESOLINE) injection 10 mg labetalol (NORMODYNE,TRANDATE) injection 10 mg pantoprazole (PROTONIX) injection 40 mg  Technical Summary: A multichannel digital EEG recording measured by the international 10-20 system with electrodes applied with paste and impedances below 5000 ohms performed in our laboratory with EKG monitoring in an awake and drowsy patient.  Hyperventilation and photic stimulation were not performed.  The digital EEG was referentially recorded, reformatted, and digitally filtered in a variety of bipolar and referential montages for optimal display.    Description: The patient is awake and drowsy during the recording.  During maximal wakefulness, there is a symmetric, medium voltage 8-8.5 Hz posterior dominant rhythm that attenuates with eye opening.  The record is symmetric.  During drowsiness and sleep, there is an increase in theta and delta slowing of the background, with shifting asymmetry over the bilateral temporal regions. Deeper stages of sleep were not seen. Hyperventilation and photic stimulation were not performed.  There were no epileptiform discharges or electrographic seizures seen.    EKG lead showed occasional extrasystolic beats.  Impression: This awake and drowsy EEG is within normal limits for age.  Clinical Correlation: A normal EEG does not exclude a clinical diagnosis of epilepsy.  Clinical correlation is advised.   Ellouise Newer, M.D.

## 2016-02-01 NOTE — Evaluation (Signed)
Clinical/Bedside Swallow Evaluation Patient Details  Name: Arthur [name] MRN: [id number] Date of Birth: 08-25-1925  Today's Date: 02/01/2016 Time: SLP Start Time (ACUTE ONLY): 0910 SLP Stop Time (ACUTE ONLY): 0935 SLP Time Calculation (min) (ACUTE ONLY): 25 min  Past Medical History:  Past Medical History  Diagnosis Date  . Coronary artery disease     MI, atherectomy 1993  . Hyperlipidemia   . Arrhythmia     1995  . Atrial fibrillation ([place])   . History of transient ischemic attack (TIA) 2005    double vision  . Hypertension   . History of gallstones   . Myocardial infarct ([place]) 12/98    inferior  . Eczema   . Elevated LFTs   . Skin cancer   . DDD (degenerative disc disease)   . Pneumonia     long time ago  . Varicose veins    Past Surgical History:  Past Surgical History  Procedure Laterality Date  . Cardiac catheterization  04/08/99    LAD 70% INTER 40-50% RCA 30-40%  . Appendectomy    . Coronary angioplasty  04/08/99    LAD  . Coronary angioplasty with stent placement  04/08/99    LAD  . Coronary angioplasty  10/17/97    RCA   . Melanoma excision  2013    lt arm  . Tonsillectomy    . Amputation Right 05/10/2013    Procedure: RIGHT SECOND TOE AMPUTATION;  Surgeon: Hessie Dibble, MD;  Location: [place];  Service: Orthopedics;  Laterality: Right;  . Melanoma excision Left 08/23/2013    Procedure: MELANOMA EXCISION;  Surgeon: Pedro Earls, MD;  Location: [facility];  Service: General;  Laterality: Left;  . Video bronchoscopy Bilateral 05/07/2015    Procedure: VIDEO BRONCHOSCOPY WITHOUT FLUORO;  Surgeon: Rigoberto Noel, MD;  Location: [facility];  Service: Cardiopulmonary;  Laterality: Bilateral;   HPI:  82 year old male admitted 01/31/16 due to right side weakness and dysarthria. CT negative, MRI pending. PMH significant for CAD, HLD, TIA, MI. BSE ordered due to pt failed RN stroke swallow screen.   Assessment / Plan / Recommendation Clinical  Impression  Pt presents with right side facial weakness, but no anterior leakage of po trials. Speech mildly dysarthric initially, however, RN indicated clarity of speech had improved since earlier today. Swallow reflex appeared timely, and laryngeal elevation adequate. Delayed swallow reflex noted after po trials, raising suspicion for airway compromise. SLP scheduled MBS and returned to inform pt/family, and pt speech was noted to be more dysarthric than during initial evaluation.  RN indicated this was the level of impairment he noted this morning. Daughter reported she felt speech was better than last night.  RN notified of variable speech intelligibiliity. Will proceed with MBS this afternoon.     Aspiration Risk  Moderate aspiration risk    Diet Recommendation NPO except meds   Medication Administration: Whole meds with puree    Other  Recommendations Oral Care Recommendations: Oral care QID Other Recommendations: Have oral suction available   Follow up Recommendations   (TBD)    Frequency and Duration  (pending MBS)          Prognosis Prognosis for Safe Diet Advancement: Good      Swallow Study   General Date of Onset: 01/31/16 HPI: 82 year old male admitted 01/31/16 due to right side weakness and dysarthria. CT negative, MRI pending. PMH significant for CAD, HLD, TIA, MI. BSE ordered due to pt  failed RN stroke swallow screen. Type of Study: Bedside Swallow Evaluation Previous Swallow Assessment: none found Diet Prior to this Study: NPO Temperature Spikes Noted: No Respiratory Status: Room air History of Recent Intubation: No Behavior/Cognition: Alert;Cooperative;Pleasant mood Oral Cavity Assessment: Within Functional Limits Oral Care Completed by SLP: Yes Oral Cavity - Dentition: Adequate natural dentition Vision: Functional for self-feeding Self-Feeding Abilities: Needs assist Patient Positioning: Upright in bed Baseline Vocal Quality: Normal Volitional Cough:  Strong Volitional Swallow: Able to elicit    Oral/Motor/Sensory Function Overall Oral Motor/Sensory Function: Moderate impairment Facial Symmetry: Abnormal symmetry right Facial Strength: Reduced right Facial Sensation: Within Functional Limits Lingual ROM: Reduced right Lingual Symmetry: Abnormal symmetry right Lingual Strength: Reduced Lingual Sensation: Within Functional Limits Velum: Within Functional Limits Mandible: Within Functional Limits   Ice Chips Ice chips: Not tested   Thin Liquid Thin Liquid: Impaired Presentation: Straw;Spoon Pharyngeal  Phase Impairments: Cough - Delayed    Nectar Thick Nectar Thick Liquid: Not tested   Honey Thick Honey Thick Liquid: Not tested   Puree Puree: Impaired Presentation: Spoon Pharyngeal Phase Impairments: Cough - Delayed   Solid   GO   Solid: Impaired Pharyngeal Phase Impairments: Cough - Delayed        Gibril Mastro, Fredirick [place]lin 02/01/2016,10:09 AM   Early Chars B. Quentin Ore [facility], [place] [phone]

## 2016-02-01 NOTE — Evaluation (Signed)
i was called to see pt as he has a new area of hemoatoma around the posterior aspect of the right quad.  It appears erythematous and indurated.  He has pain to palpation.  No movement abnormalities of the right knee joint or foot.  I have ordered a STAT CBC and called general surgery to evaluate.

## 2016-02-01 NOTE — Progress Notes (Signed)
Paged MD regarding new bruising/redness/swelling in pts right upper thigh.  Abds and ACE wrap removed while MD present and revealed no swelling/redness/brusing in lower right extremity.  MD to consult and potentially provide other orders.  Will continue to monitor.

## 2016-02-01 NOTE — Evaluation (Signed)
Speech Language Pathology Evaluation Patient Details Name: Arthur [name] MRN: [id number] DOB: 07/21/1925 Today's Date: 02/01/2016 Time: [id number] SLP Time Calculation (min) (ACUTE ONLY): 20 min  Problem List:  Patient Active Problem List   Diagnosis Date Noted  . Stroke (cerebrum) ([place]) 02/01/2016  . Stroke due to embolism of left middle cerebral artery ([place]) 01/31/2016  . Varicose veins of right lower extremity with complications [id number]  . CKD (chronic kidney disease) stage 3, GFR 30-59 ml/min 10/31/2015  . Acute respiratory failure ([place]) 10/28/2015  . CAP (community acquired pneumonia) 10/28/2015  . Acute renal insufficiency 10/28/2015  . SIRS (systemic inflammatory response syndrome) ([place]) 10/28/2015  . Lower back pain 10/03/2015  . Abnormal bruising 10/03/2015  . Varicose veins of leg with complications [id number]  . DVT (deep venous thrombosis) ([place]) 06/07/2015  . Venous ulcers of both lower extremities 05/22/2015  . AAA (abdominal aortic aneurysm) without rupture- 4.6 05/07/2015  . Chronic cutaneous venous stasis ulcer ([place]) 05/05/2015  . Autoimmune hepatitis treated with steroids ([place]) 05/05/2015  . 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 Melanoma of skin ([place]) 07/27/2013  . Osteomyelitis of toe of right foot 04/20/2013  . Current use of long term anticoagulation 03/04/2013  . PSA, INCREASED 10/18/2009  . Coronary atherosclerosis 10/16/2008  . ECZEMA 10/16/2008  . HLD (hyperlipidemia) 09/02/2007  . Essential hypertension 09/02/2007  . MYOCARDIAL INFARCTION, HX OF 09/02/2007  . Atrial fibrillation ([place]) 09/02/2007   Past Medical History:  Past Medical History  Diagnosis Date  . Coronary artery disease     MI, atherectomy 1993  . Hyperlipidemia   . Arrhythmia     1995  . Atrial fibrillation ([place])   . History of transient ischemic attack (TIA) 2005    double vision  . Hypertension   . History of gallstones   . Myocardial infarct ([place]) 12/98     inferior  . Eczema   . Elevated LFTs   . Skin cancer   . DDD (degenerative disc disease)   . Pneumonia     long time ago  . Varicose veins    Past Surgical History:  Past Surgical History  Procedure Laterality Date  . Cardiac catheterization  04/08/99    LAD 70% INTER 40-50% RCA 30-40%  . Appendectomy    . Coronary angioplasty  04/08/99    LAD  . Coronary angioplasty with stent placement  04/08/99    LAD  . Coronary angioplasty  10/17/97    RCA   . Melanoma excision  2013    lt arm  . Tonsillectomy    . Amputation Right 05/10/2013    Procedure: RIGHT SECOND TOE AMPUTATION;  Surgeon: Hessie Dibble, MD;  Location: [facility];  Service: Orthopedics;  Laterality: Right;  . Melanoma excision Left 08/23/2013    Procedure: MELANOMA EXCISION;  Surgeon: Pedro Earls, MD;  Location: [facility];  Service: General;  Laterality: Left;  . Video bronchoscopy Bilateral 05/07/2015    Procedure: VIDEO BRONCHOSCOPY WITHOUT FLUORO;  Surgeon: Rigoberto Noel, MD;  Location: [facility];  Service: Cardiopulmonary;  Laterality: Bilateral;   HPI:  82 year old male admitted 01/31/16 due to right side weakness and dysarthria. CT negative, MRI pending. PMH significant for CAD, HLD, TIA, MI. SLE ordered due to CVA   Assessment / Plan / Recommendation Clinical Impression  Pt presents with moderate dysarthria and mild high level word finding deficits. Right facial weakness is apparent. ST to continue to follow for oral  motor strengthening exercises for speech intelligibility and swallow safety. Dysarthria appears to wax and wane.     SLP Assessment  Patient needs continued Speech Lanaguage Pathology Services    Follow Up Recommendations  Outpatient SLP    Frequency and Duration min 1 x/week  2 weeks      SLP Evaluation Prior Functioning  Cognitive/Linguistic Baseline: Within functional limits Available Help at Discharge: Family Vocation: Retired   Doctor, hospital Level:  Oriented to person;Oriented to place;Oriented to situation;Disoriented to time    Comprehension  Auditory Comprehension Overall Auditory Comprehension: Appears within functional limits for tasks assessed Yes/No Questions: Within Functional Limits Commands: Within Functional Limits Conversation: Simple Visual Recognition/Discrimination Discrimination: Not tested Reading Comprehension Reading Status: Not tested    Expression Expression Primary Mode of Expression: Verbal Verbal Expression Overall Verbal Expression: Impaired Initiation: No impairment Level of Generative/Spontaneous Verbalization: Sentence Repetition: No impairment Naming: Impairment Responsive: 76-100% accurate Confrontation: Within functional limits Convergent: 75-100% accurate Divergent: 75-100% accurate Verbal Errors: Semantic paraphasias Pragmatics: No impairment Written Expression Dominant Hand: Right Written Expression: Not tested   Oral / Motor  Oral Motor/Sensory Function Overall Oral Motor/Sensory Function: Moderate impairment Facial Symmetry: Abnormal symmetry right Facial Strength: Reduced right Facial Sensation: Within Functional Limits Lingual ROM: Reduced right Lingual Symmetry: Abnormal symmetry right Lingual Strength: Reduced Lingual Sensation: Within Functional Limits Velum: Within Functional Limits Mandible: Within Functional Limits Motor Speech Overall Motor Speech: Impaired Respiration: Within functional limits Phonation: Normal Resonance: Within functional limits Articulation: Impaired Level of Impairment: Sentence Intelligibility: Intelligibility reduced Word: 50-74% accurate Phrase: 50-74% accurate Sentence: 25-49% accurate Conversation: 25-49% accurate Motor Planning: Witnin functional limits Motor Speech Errors: Aware Effective Techniques: Slow rate;Over-articulate   GO                    Shonna Chock 02/01/2016, 10:18 AM  Enriqueta Shutter. [facility],  [place] [phone]

## 2016-02-02 ENCOUNTER — Inpatient Hospital Stay (HOSPITAL_COMMUNITY): Payer: Medicare Other

## 2016-02-02 ENCOUNTER — Encounter (HOSPITAL_COMMUNITY): Payer: Self-pay | Admitting: Radiology

## 2016-02-02 LAB — BASIC METABOLIC PANEL
Anion gap: 9 (ref 5–15)
BUN: 15 mg/dL (ref 6–20)
CHLORIDE: 106 mmol/L (ref 101–111)
CO2: 23 mmol/L (ref 22–32)
CREATININE: 1.14 mg/dL (ref 0.61–1.24)
Calcium: 8.7 mg/dL — ABNORMAL LOW (ref 8.9–10.3)
GFR calc Af Amer: >60 mL/min (ref >60)
GFR calc non Af Amer: 55 mL/min — ABNORMAL LOW (ref >60)
GLUCOSE: 89 mg/dL (ref 65–99)
POTASSIUM: 4.2 mmol/L (ref 3.5–5.1)
Sodium: 138 mmol/L (ref 135–145)

## 2016-02-02 LAB — HEMOGLOBIN A1C
HEMOGLOBIN A1C: 5.9 % — AB (ref 4.8–5.6)
Mean Plasma Glucose: 123 mg/dL

## 2016-02-02 LAB — CBC
HEMATOCRIT: 42.5 % (ref 39.0–52.0)
Hemoglobin: 13.8 g/dL (ref 13.0–17.0)
MCH: 32 pg (ref 26.0–34.0)
MCHC: 32.5 g/dL (ref 30.0–36.0)
MCV: 98.6 fL (ref 78.0–100.0)
PLATELETS: 120 10*3/uL — AB (ref 150–400)
RBC: 4.31 MIL/uL (ref 4.22–5.81)
RDW: 15.1 % (ref 11.5–15.5)
WBC: 10.8 10*3/uL — ABNORMAL HIGH (ref 4.0–10.5)

## 2016-02-02 MED ORDER — WARFARIN - PHARMACIST DOSING INPATIENT
Freq: Every day | Status: DC
  Administered 2016-02-02: 18:00:00

## 2016-02-02 MED ORDER — IOHEXOL 350 MG/ML SOLN
100.0000 mL | Freq: Once | INTRAVENOUS | Status: AC | PRN
  Administered 2016-02-02: 100 mL via INTRAVENOUS

## 2016-02-02 MED ORDER — ASPIRIN EC 325 MG PO TBEC
325.0000 mg | DELAYED_RELEASE_TABLET | Freq: Every day | ORAL | Status: DC
  Administered 2016-02-02: 325 mg via ORAL
  Filled 2016-02-02: qty 1

## 2016-02-02 MED ORDER — AMLODIPINE BESYLATE 5 MG PO TABS
5.0000 mg | ORAL_TABLET | Freq: Every day | ORAL | Status: DC
  Administered 2016-02-02 – 2016-02-04 (×3): 5 mg via ORAL
  Filled 2016-02-02 (×3): qty 1

## 2016-02-02 MED ORDER — WARFARIN SODIUM 5 MG PO TABS
5.0000 mg | ORAL_TABLET | Freq: Once | ORAL | Status: AC
  Administered 2016-02-02: 5 mg via ORAL
  Filled 2016-02-02: qty 1

## 2016-02-02 MED ORDER — PANTOPRAZOLE SODIUM 40 MG PO TBEC
40.0000 mg | DELAYED_RELEASE_TABLET | Freq: Every day | ORAL | Status: DC
  Administered 2016-02-02 – 2016-02-04 (×3): 40 mg via ORAL
  Filled 2016-02-02 (×3): qty 1

## 2016-02-02 NOTE — Progress Notes (Signed)
ANTICOAGULATION CONSULT NOTE - Initial Consult  Pharmacy Consult for warfarin Indication: atrial fibrillation and stroke  Allergies  Allergen Reactions  . Penicillins Hives    Patient Measurements: Height: 6\' 2"  (188 cm) Weight: 204 lb 4.8 oz (92.67 kg) IBW/kg (Calculated) : 82.2  Vital Signs: Temp: 98.4 F (36.9 C) (03/18 0838) Temp Source: Oral (03/18 0838) BP: 150/79 mmHg (03/18 1129) Pulse Rate: 72 (03/18 1000)  Labs:  Recent Labs  01/31/16 2151 01/31/16 2157 02/01/16 2205 02/02/16 0733  HGB 14.3 16.0 14.8 13.8  HCT 43.9 47.0 45.4 42.5  PLT 133*  --  127* 120*  APTT 29  --   --   --   LABPROT 15.2  --   --   --   INR 1.18  --   --   --   CREATININE 1.42* 1.30*  --  1.14    Estimated Creatinine Clearance: 52.1 mL/min (by C-G formula based on Cr of 1.14).  Assessment: 82 yo male presented as code stroke and received tpa on 3/16  PMH: afib on warfarin, HTN, CAD  AC: afib on warfarin pta. INR 1.18 on admission (last dose 3/16 prior to tpa) - hematoma resolved on thigh  Pta dose 5 mg MWF, 2.5 mg AOD  Renal: SCr 1.14  Heme: H&H 13.8/42.5, Plt 120  Goal of Therapy:  INR 2-3 Monitor platelets by anticoagulation protocol: Yes   Plan:  Warfarin 5 mg x 1 INR daily, CBC q72h Monitor for s/sx of bleeding  Levester Fresh, PharmD, BCPS, [facility] Clinical Pharmacist Pager [phone] 02/02/2016 11:30 AM

## 2016-02-02 NOTE — Consult Note (Signed)
Reason for Consult:thigh hematoma Referring Physician: Dr Debby Bud is an 82 y.o. male.  HPI: 82 year old male with chronic A. fib, coronary artery disease, hypertension, hyperlipidemia was admitted with an acute left brain infarct and given TPA. He was noticed to have a right posterior thigh hematoma this evening and a general surgery consult was requested. The patient had been off his Coumadin for a right lower extremity skin laser procedure. The patient is unable to tell me exactly what it was for-it is also quite possible I may not be would understand him. He was off his Coumadin for this procedure and restarted it afterwards and then developed right-sided issues and dysarthria and he was brought to the emergency room for evaluation and found to have an acute infarct. He denies any pain in his leg.  Past Medical History  Diagnosis Date  . Coronary artery disease     MI, atherectomy 1993  . Hyperlipidemia   . Arrhythmia     1995  . Atrial fibrillation ([place])   . History of transient ischemic attack (TIA) 2005    double vision  . Hypertension   . History of gallstones   . Myocardial infarct ([place]) 12/98    inferior  . Eczema   . Elevated LFTs   . Skin cancer   . DDD (degenerative disc disease)   . Pneumonia     long time ago  . Varicose veins     Past Surgical History  Procedure Laterality Date  . Cardiac catheterization  04/08/99    LAD 70% INTER 40-50% RCA 30-40%  . Appendectomy    . Coronary angioplasty  04/08/99    LAD  . Coronary angioplasty with stent placement  04/08/99    LAD  . Coronary angioplasty  10/17/97    RCA   . Melanoma excision  2013    lt arm  . Tonsillectomy    . Amputation Right 05/10/2013    Procedure: RIGHT SECOND TOE AMPUTATION;  Surgeon: Hessie Dibble, MD;  Location: [place];  Service: Orthopedics;  Laterality: Right;  . Melanoma excision Left 08/23/2013    Procedure: MELANOMA EXCISION;  Surgeon: Pedro Earls,  MD;  Location: [facility];  Service: General;  Laterality: Left;  . Video bronchoscopy Bilateral 05/07/2015    Procedure: VIDEO BRONCHOSCOPY WITHOUT FLUORO;  Surgeon: Rigoberto Noel, MD;  Location: [place];  Service: Cardiopulmonary;  Laterality: Bilateral;    Family History  Problem Relation Age of Onset  . Heart attack Mother 29    MI  . Pneumonia Father     Social History:  reports that he quit smoking about 48 years ago. His smoking use included Cigars and Pipe. He has never used smokeless tobacco. He reports that he drinks alcohol. He reports that he does not use illicit drugs.  Allergies:  Allergies  Allergen Reactions  . Penicillins Hives    Medications: I have reviewed the patient's current medications.  Results for orders placed or performed during the hospital encounter of 01/31/16 (from the past 48 hour(s))  Ethanol     Status: None   Collection Time: 01/31/16  9:51 PM  Result Value Ref Range   Alcohol, Ethyl (B) <5 <5 mg/dL    Comment:        LOWEST DETECTABLE LIMIT FOR SERUM ALCOHOL IS 5 mg/dL FOR MEDICAL PURPOSES ONLY   Protime-INR     Status: None   Collection Time: 01/31/16  9:51 PM  Result Value  Ref Range   Prothrombin Time 15.2 11.6 - 15.2 seconds   INR 1.18 0.00 - 1.49  APTT     Status: None   Collection Time: 01/31/16  9:51 PM  Result Value Ref Range   aPTT 29 24 - 37 seconds  CBC     Status: Abnormal   Collection Time: 01/31/16  9:51 PM  Result Value Ref Range   WBC 9.0 4.0 - 10.5 K/uL   RBC 4.44 4.22 - 5.81 MIL/uL   Hemoglobin 14.3 13.0 - 17.0 g/dL   HCT 43.9 39.0 - 52.0 %   MCV 98.9 78.0 - 100.0 fL   MCH 32.2 26.0 - 34.0 pg   MCHC 32.6 30.0 - 36.0 g/dL   RDW 14.9 11.5 - 15.5 %   Platelets 133 (L) 150 - 400 K/uL  Differential     Status: Abnormal   Collection Time: 01/31/16  9:51 PM  Result Value Ref Range   Neutrophils Relative % 33 %   Neutro Abs 3.0 1.7 - 7.7 K/uL   Lymphocytes Relative 53 %   Lymphs Abs 4.8 (H) 0.7 - 4.0 K/uL    Monocytes Relative 13 %   Monocytes Absolute 1.1 (H) 0.1 - 1.0 K/uL   Eosinophils Relative 1 %   Eosinophils Absolute 0.1 0.0 - 0.7 K/uL   Basophils Relative 0 %   Basophils Absolute 0.0 0.0 - 0.1 K/uL  Comprehensive metabolic panel     Status: Abnormal   Collection Time: 01/31/16  9:51 PM  Result Value Ref Range   Sodium 138 135 - 145 mmol/L   Potassium 4.7 3.5 - 5.1 mmol/L   Chloride 102 101 - 111 mmol/L   CO2 24 22 - 32 mmol/L   Glucose, Bld 142 (H) 65 - 99 mg/dL   BUN 20 6 - 20 mg/dL   Creatinine, Ser 1.42 (H) 0.61 - 1.24 mg/dL   Calcium 9.1 8.9 - 10.3 mg/dL   Total Protein 6.9 6.5 - 8.1 g/dL   Albumin 3.4 (L) 3.5 - 5.0 g/dL   AST 91 (H) 15 - 41 U/L   ALT 118 (H) 17 - 63 U/L   Alkaline Phosphatase 84 38 - 126 U/L   Total Bilirubin 0.8 0.3 - 1.2 mg/dL   GFR calc non Af Amer 43 (L) >60 mL/min   GFR calc Af Amer 49 (L) >60 mL/min    Comment: (NOTE) The eGFR has been calculated using the CKD EPI equation. This calculation has not been validated in all clinical situations. eGFR's persistently <60 mL/min signify possible Chronic Kidney Disease.    Anion gap 12 5 - 15  I-stat troponin, ED (not at [facility], [facility])     Status: None   Collection Time: 01/31/16  9:54 PM  Result Value Ref Range   Troponin i, poc 0.03 0.00 - 0.08 ng/mL   Comment 3            Comment: Due to the release kinetics of cTnI, a negative result within the first hours of the onset of symptoms does not rule out myocardial infarction with certainty. If myocardial infarction is still suspected, repeat the test at appropriate intervals.   I-Stat Chem 8, ED  (not at [facility], [facility])     Status: Abnormal   Collection Time: 01/31/16  9:57 PM  Result Value Ref Range   Sodium 138 135 - 145 mmol/L   Potassium 4.6 3.5 - 5.1 mmol/L   Chloride 102 101 - 111 mmol/L   BUN 25 (  H) 6 - 20 mg/dL   Creatinine, Ser 1.30 (H) 0.61 - 1.24 mg/dL   Glucose, Bld 137 (H) 65 - 99 mg/dL   Calcium, Ion 1.06 (L) 1.13 - 1.30 mmol/L   TCO2  25 0 - 100 mmol/L   Hemoglobin 16.0 13.0 - 17.0 g/dL   HCT 47.0 39.0 - 52.0 %  CBG monitoring, ED     Status: Abnormal   Collection Time: 01/31/16 10:09 PM  Result Value Ref Range   Glucose-Capillary 108 (H) 65 - 99 mg/dL  Urine rapid drug screen (hosp performed)not at [facility]     Status: None   Collection Time: 02/01/16 12:11 AM  Result Value Ref Range   Opiates NONE DETECTED NONE DETECTED   Cocaine NONE DETECTED NONE DETECTED   Benzodiazepines NONE DETECTED NONE DETECTED   Amphetamines NONE DETECTED NONE DETECTED   Tetrahydrocannabinol NONE DETECTED NONE DETECTED   Barbiturates NONE DETECTED NONE DETECTED    Comment:        DRUG SCREEN FOR MEDICAL PURPOSES ONLY.  IF CONFIRMATION IS NEEDED FOR ANY PURPOSE, NOTIFY LAB WITHIN 5 DAYS.        LOWEST DETECTABLE LIMITS FOR URINE DRUG SCREEN Drug Class       Cutoff (ng/mL) Amphetamine      1000 Barbiturate      200 Benzodiazepine   465 Tricyclics       035 Opiates          300 Cocaine          300 THC              50   Urinalysis, Routine w reflex microscopic (not at [facility])     Status: None   Collection Time: 02/01/16 12:11 AM  Result Value Ref Range   Color, Urine YELLOW YELLOW   APPearance CLEAR CLEAR   Specific Gravity, Urine 1.010 1.005 - 1.030   pH 6.0 5.0 - 8.0   Glucose, UA NEGATIVE NEGATIVE mg/dL   Hgb urine dipstick NEGATIVE NEGATIVE   Bilirubin Urine NEGATIVE NEGATIVE   Ketones, ur NEGATIVE NEGATIVE mg/dL   Protein, ur NEGATIVE NEGATIVE mg/dL   Nitrite NEGATIVE NEGATIVE   Leukocytes, UA NEGATIVE NEGATIVE    Comment: MICROSCOPIC NOT DONE ON URINES WITH NEGATIVE PROTEIN, BLOOD, LEUKOCYTES, NITRITE, OR GLUCOSE <1000 mg/dL.  MRSA PCR Screening     Status: None   Collection Time: 02/01/16 12:28 AM  Result Value Ref Range   MRSA by PCR NEGATIVE NEGATIVE    Comment:        The GeneXpert MRSA Assay (FDA approved for NASAL specimens only), is one component of a comprehensive MRSA colonization surveillance program. It  is not intended to diagnose MRSA infection nor to guide or monitor treatment for MRSA infections.   Glucose, capillary     Status: None   Collection Time: 02/01/16 12:32 AM  Result Value Ref Range   Glucose-Capillary 76 65 - 99 mg/dL  Lipid panel     Status: Abnormal   Collection Time: 02/01/16  4:00 AM  Result Value Ref Range   Cholesterol 161 0 - 200 mg/dL   Triglycerides 58 <150 mg/dL   HDL 46 >40 mg/dL   Total CHOL/HDL Ratio 3.5 RATIO   VLDL 12 0 - 40 mg/dL   LDL Cholesterol 103 (H) 0 - 99 mg/dL    Comment:        Total Cholesterol/HDL:CHD Risk Coronary Heart Disease Risk Table  Men   Women  1/2 Average Risk   3.4   3.3  Average Risk       5.0   4.4  2 X Average Risk   9.6   7.1  3 X Average Risk  23.4   11.0        Use the calculated Patient Ratio above and the CHD Risk Table to determine the patient's CHD Risk.        ATP III CLASSIFICATION (LDL):  <100     mg/dL   Optimal  100-129  mg/dL   Near or Above                    Optimal  130-159  mg/dL   Borderline  160-189  mg/dL   High  >190     mg/dL   Very High   CBC with Differential/Platelet     Status: Abnormal   Collection Time: 02/01/16 10:05 PM  Result Value Ref Range   WBC 14.2 (H) 4.0 - 10.5 K/uL   RBC 4.64 4.22 - 5.81 MIL/uL   Hemoglobin 14.8 13.0 - 17.0 g/dL   HCT 45.4 39.0 - 52.0 %   MCV 97.8 78.0 - 100.0 fL   MCH 31.9 26.0 - 34.0 pg   MCHC 32.6 30.0 - 36.0 g/dL   RDW 15.1 11.5 - 15.5 %   Platelets 127 (L) 150 - 400 K/uL   Neutrophils Relative % 38 %   Lymphocytes Relative 47 %   Monocytes Relative 15 %   Eosinophils Relative 0 %   Basophils Relative 0 %   Neutro Abs 5.4 1.7 - 7.7 K/uL   Lymphs Abs 6.7 (H) 0.7 - 4.0 K/uL   Monocytes Absolute 2.1 (H) 0.1 - 1.0 K/uL   Eosinophils Absolute 0.0 0.0 - 0.7 K/uL   Basophils Absolute 0.0 0.0 - 0.1 K/uL   RBC Morphology TARGET CELLS    WBC Morphology ATYPICAL LYMPHOCYTES     Ct Head Wo Contrast  01/31/2016  CLINICAL DATA:   Code stroke, right-sided weakness, right-sided facial droop. EXAM: CT HEAD WITHOUT CONTRAST TECHNIQUE: Contiguous axial images were obtained from the base of the skull through the vertex without intravenous contrast. COMPARISON:  Head CT dated 10/08/2004. FINDINGS: Brain: There is mild generalized brain atrophy with commensurate dilatation of the ventricles and sulci. There is an old infarct within the right cerebellum with associated encephalomalacia. There is no mass, hemorrhage, edema or other evidence of acute parenchymal abnormality. No extra-axial hemorrhage. Stable meningioma noted over the left occipital lobe, measuring 2.2 x 1 cm, now partially calcified. Vascular: No hyperdense vessel or unexpected calcification. Skull: Negative for fracture or focal lesion. Sinuses/Orbits: No acute findings. Other: None. IMPRESSION: 1. No acute findings.  No intracranial hemorrhage or edema. 2. 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 3. Meningioma overlying the left occipital lobe, stable in size, now partially calcified. These results were called by telephone at the time of interpretation on 01/31/2016 at 10:09 pm to Dr. Wendee Beavers, who verbally acknowledged these results. Electronically Signed   By: Franki Cabot M.D.   On: 01/31/2016 22:12   Mr Jodene Nam Head Wo Contrast  02/02/2016  CLINICAL DATA:  Initial evaluation for acute dysarthria with right-sided weakness. Status post tPA. EXAM: MRI HEAD WITHOUT CONTRAST MRA HEAD WITHOUT CONTRAST TECHNIQUE: Multiplanar, multiecho pulse sequences of the brain and surrounding structures were obtained without intravenous contrast. Angiographic images of the head were obtained using MRA technique without contrast. COMPARISON:  Prior CT from 01/31/2016. FINDINGS: MRI HEAD FINDINGS Study degraded by motion artifact. Diffuse prominence of the CSF containing spaces compatible with generalized age-related cerebral atrophy. No significant and small vessel type  changes for patient age. Encephalomalacia within the right cerebellar hemisphere compatible with remote infarct. Additional remote left cerebellar infarct present. Diffusion-weighted imaging demonstrates patchy multi focal acute ischemic infarcts involving the left MCA distribution. Specifically, these involve the cortical gray matter of the left insular cortex with extension into the operculum. Few patchy ischemic infarcts within the deeper white matter of the left centrum semi ovale. No significant mass effect or associated hemorrhage. Major intracranial vascular flow voids preserved. No other acute ischemic infarct. Meningioma overlying the left parietal convexity measures approximately 2.1 cm No associated edema. No other mass lesion. No midline shift or mass effect. Ventricular prominence related to global parenchymal volume loss present without hydrocephalus. No extra-axial fluid collection. Craniocervical junction within normal limits. Visualized upper cervical spine unremarkable. Pituitary gland within normal limits. No acute abnormality about the orbits. Sequela prior lens extraction noted on the right. Paranasal sinuses are clear. Trace opacity within left mastoid air cells. Inner ear structures normal. Bone marrow signal intensity within normal limits. No scalp soft tissue abnormality. MRA HEAD FINDINGS ANTERIOR CIRCULATION: Visualized distal cervical segments of the internal carotid arteries are patent with antegrade flow. Petrous, cavernous, and supraclinoid segments patent without high-grade stenosis or occlusion. A1 segments patent, with probable moderate stenosis within the mid left A1 segment. Anterior communicating artery normal. Anterior cerebral arteries opacified to their distal aspects. M1 segments patent without stenosis or occlusion. MCA bifurcations grossly normal. Probable short-segment moderate stenoses within the proximal M2 branches bilaterally. Possible into branch occlusion on the  right, not entirely certain given the absence of infarct on this side and motion artifact on this exam. No M2 occlusion on the left. Distal small vessel atheromatous irregularity. POSTERIOR CIRCULATION: Vertebral arteries patent to the vertebral basilar junction but markedly diminutive bilaterally. Possible short-segment moderate stenosis within the distal right V4 segment. Left posterior inferior cerebral artery patent. Right posterior inferior cerebellar artery not well visualized, and may be occluded given the right cerebellar infarct. Basilar artery diminutive but patent to its distal aspect. Superior cerebral arteries patent bilaterally. Fetal origin of the PCAs on the right, supplied via a right posterior communicating artery. Left P1 segment hypoplastic, with a prominent left posterior communicating artery present as well. PCAs opacified to the distal aspects. No aneurysm. IMPRESSION: MRI HEAD IMPRESSION: 1. Patchy small volume acute ischemic nonhemorrhagic left MCA territory infarcts as above. 2. Remote bilateral cerebellar infarcts, right larger than left. 3. Moderate generalized age-related cerebral atrophy. 4. 2.1 cm left parietal convexity meningioma.  No associated edema. MRA HEAD IMPRESSION: 1. No definite large or proximal arterial branch occlusion within the intracranial circulation. 2. Question proximal right M2 branch occlusion, not entirely certain given the absence of acute or chronic infarct within this territory. Finding may be related to motion artifact on this exam. 3. Probable proximal nonocclusive moderate M2 stenoses bilaterally. 4. Diminutive vertebrobasilar system with fetal origin of the right PCA. Left PCA also largely supplied via a prominent left posterior communicating artery. 5. Nonvisualization of the right PICA, which may be occluded given the remote right cerebellar infarct. Electronically Signed   By: Jeannine Boga M.D.   On: 02/02/2016 00:22   Mr Brain Wo  Contrast  02/02/2016  CLINICAL DATA:  Initial evaluation for acute dysarthria with right-sided weakness. Status post tPA. EXAM: MRI HEAD WITHOUT  CONTRAST MRA HEAD WITHOUT CONTRAST TECHNIQUE: Multiplanar, multiecho pulse sequences of the brain and surrounding structures were obtained without intravenous contrast. Angiographic images of the head were obtained using MRA technique without contrast. COMPARISON:  Prior CT from 01/31/2016. FINDINGS: MRI HEAD FINDINGS Study degraded by motion artifact. Diffuse prominence of the CSF containing spaces compatible with generalized age-related cerebral atrophy. No significant and small vessel type changes for patient age. Encephalomalacia within the right cerebellar hemisphere compatible with remote infarct. Additional remote left cerebellar infarct present. Diffusion-weighted imaging demonstrates patchy multi focal acute ischemic infarcts involving the left MCA distribution. Specifically, these involve the cortical gray matter of the left insular cortex with extension into the operculum. Few patchy ischemic infarcts within the deeper white matter of the left centrum semi ovale. No significant mass effect or associated hemorrhage. Major intracranial vascular flow voids preserved. No other acute ischemic infarct. Meningioma overlying the left parietal convexity measures approximately 2.1 cm No associated edema. No other mass lesion. No midline shift or mass effect. Ventricular prominence related to global parenchymal volume loss present without hydrocephalus. No extra-axial fluid collection. Craniocervical junction within normal limits. Visualized upper cervical spine unremarkable. Pituitary gland within normal limits. No acute abnormality about the orbits. Sequela prior lens extraction noted on the right. Paranasal sinuses are clear. Trace opacity within left mastoid air cells. Inner ear structures normal. Bone marrow signal intensity within normal limits. No scalp soft tissue  abnormality. MRA HEAD FINDINGS ANTERIOR CIRCULATION: Visualized distal cervical segments of the internal carotid arteries are patent with antegrade flow. Petrous, cavernous, and supraclinoid segments patent without high-grade stenosis or occlusion. A1 segments patent, with probable moderate stenosis within the mid left A1 segment. Anterior communicating artery normal. Anterior cerebral arteries opacified to their distal aspects. M1 segments patent without stenosis or occlusion. MCA bifurcations grossly normal. Probable short-segment moderate stenoses within the proximal M2 branches bilaterally. Possible into branch occlusion on the right, not entirely certain given the absence of infarct on this side and motion artifact on this exam. No M2 occlusion on the left. Distal small vessel atheromatous irregularity. POSTERIOR CIRCULATION: Vertebral arteries patent to the vertebral basilar junction but markedly diminutive bilaterally. Possible short-segment moderate stenosis within the distal right V4 segment. Left posterior inferior cerebral artery patent. Right posterior inferior cerebellar artery not well visualized, and may be occluded given the right cerebellar infarct. Basilar artery diminutive but patent to its distal aspect. Superior cerebral arteries patent bilaterally. Fetal origin of the PCAs on the right, supplied via a right posterior communicating artery. Left P1 segment hypoplastic, with a prominent left posterior communicating artery present as well. PCAs opacified to the distal aspects. No aneurysm. IMPRESSION: MRI HEAD IMPRESSION: 1. Patchy small volume acute ischemic nonhemorrhagic left MCA territory infarcts as above. 2. Remote bilateral cerebellar infarcts, right larger than left. 3. Moderate generalized age-related cerebral atrophy. 4. 2.1 cm left parietal convexity meningioma.  No associated edema. MRA HEAD IMPRESSION: 1. No definite large or proximal arterial branch occlusion within the intracranial  circulation. 2. Question proximal right M2 branch occlusion, not entirely certain given the absence of acute or chronic infarct within this territory. Finding may be related to motion artifact on this exam. 3. Probable proximal nonocclusive moderate M2 stenoses bilaterally. 4. Diminutive vertebrobasilar system with fetal origin of the right PCA. Left PCA also largely supplied via a prominent left posterior communicating artery. 5. Nonvisualization of the right PICA, which may be occluded given the remote right cerebellar infarct. Electronically Signed   By: Marland Kitchen  Jeannine Boga M.D.   On: 02/02/2016 00:22   Dg Swallowing Func-speech Pathology  02/01/2016  Lorre Nick, [place]     02/01/2016  2:31 PM Objective Swallowing Evaluation: Type of Study: MBS-Modified Barium Swallow Study Patient Details Name: KAIAN FAHS MRN: [id number] Date of Birth: September 19, 1925 Today's Date: 02/01/2016 Time: SLP Start Time (ACUTE ONLY): 1300-SLP Stop Time (ACUTE ONLY): 1330 SLP Time Calculation (min) (ACUTE ONLY): 30 min Past Medical History: Past Medical History Diagnosis Date . Coronary artery disease    MI, atherectomy 1993 . Hyperlipidemia  . Arrhythmia    1995 . Atrial fibrillation ([place])  . History of transient ischemic attack (TIA) 2005   double vision . Hypertension  . History of gallstones  . Myocardial infarct ([place]) 12/98   inferior . Eczema  . Elevated LFTs  . Skin cancer  . DDD (degenerative disc disease)  . Pneumonia    long time ago . Varicose veins  Past Surgical History: Past Surgical History Procedure Laterality Date . Cardiac catheterization  04/08/99   LAD 70% INTER 40-50% RCA 30-40% . Appendectomy   . Coronary angioplasty  04/08/99   LAD . Coronary angioplasty with stent placement  04/08/99   LAD . Coronary angioplasty  10/17/97   RCA  . Melanoma excision  2013   lt arm . Tonsillectomy   . Amputation Right 05/10/2013   Procedure: RIGHT SECOND TOE AMPUTATION;  Surgeon: Hessie Dibble, MD;  Location: [place];  Service: Orthopedics;  Laterality: Right; . Melanoma excision Left 08/23/2013   Procedure: MELANOMA EXCISION;  Surgeon: Pedro Earls, MD;  Location: [facility];  Service: General;  Laterality: Left; . Video bronchoscopy Bilateral 05/07/2015   Procedure: VIDEO BRONCHOSCOPY WITHOUT FLUORO;  Surgeon: Rigoberto Noel, MD;  Location: [place];  Service: Cardiopulmonary;  Laterality: Bilateral; HPI: 82 year old male admitted 01/31/16 due to right side weakness and dysarthria. CT negative, MRI pending. PMH significant for CAD, HLD, TIA, MI. MBS recommended following BSE. Subjective: Pt seen in radiology for MBS Assessment / Plan / Recommendation CHL IP CLINICAL IMPRESSIONS 02/01/2016 Therapy Diagnosis Mild pharyngeal phase dysphagia;Mild oral phase dysphagia Clinical Impression Pt presents with mild oropharyngeal dysphagia, characterized by slight right anterior leakage of thin liquid, and one episode of aspiration after large consecutive boluses of thin liquid via straw. Immediate cough response noted elicited. Puree, solid and barium tablet cleared the oropharynx without difficulty or delay. Smaller, individual sips of thin liquid via cup or straw were tolerated without penetration or aspiration. Recommend regular solids and thin liquids via cup or straw, taking small bites and sips. ST to follow for diet tolerance and education. Impact on safety and function Mild aspiration risk   CHL IP TREATMENT RECOMMENDATION 02/01/2016 Treatment Recommendations Therapy as outlined in treatment plan below   Prognosis 02/01/2016 Prognosis for Safe Diet Advancement Good Barriers to Reach Goals -- Barriers/Prognosis Comment -- CHL IP DIET RECOMMENDATION 02/01/2016 SLP Diet Recommendations Regular solids;Thin liquid Liquid Administration via Cup;Straw Medication Administration Whole meds with liquid Compensations Minimize environmental distractions;Slow rate;Small sips/bites Postural Changes Seated upright at 90 degrees   CHL  IP OTHER RECOMMENDATIONS 02/01/2016 Recommended Consults -- Oral Care Recommendations Oral care BID Other Recommendations Have oral suction available   CHL IP FOLLOW UP RECOMMENDATIONS 02/01/2016 Follow up Recommendations Outpatient SLP   CHL IP FREQUENCY AND DURATION 02/01/2016 Speech Therapy Frequency (ACUTE ONLY) min 1 x/week Treatment Duration 2 weeks      CHL IP ORAL PHASE 02/01/2016 Oral Phase Impaired  Oral - Pudding Teaspoon -- Oral - Pudding Cup -- Oral - Honey Teaspoon -- Oral - Honey Cup -- Oral - Nectar Teaspoon -- Oral - Nectar Cup -- Oral - Nectar Straw -- Oral - Thin Teaspoon -- Oral - Thin Cup Right anterior bolus loss Oral - Thin Straw -- Oral - Puree -- Oral - Mech Soft -- Oral - Regular -- Oral - Multi-Consistency -- Oral - Pill -- Oral Phase - Comment --  CHL IP PHARYNGEAL PHASE 02/01/2016 Pharyngeal Phase Impaired Pharyngeal- Pudding Teaspoon -- Pharyngeal -- Pharyngeal- Pudding Cup -- Pharyngeal -- Pharyngeal- Honey Teaspoon -- Pharyngeal -- Pharyngeal- Honey Cup -- Pharyngeal -- Pharyngeal- Nectar Teaspoon -- Pharyngeal -- Pharyngeal- Nectar Cup -- Pharyngeal -- Pharyngeal- Nectar Straw -- Pharyngeal -- Pharyngeal- Thin Teaspoon -- Pharyngeal -- Pharyngeal- Thin Cup -- Pharyngeal -- Pharyngeal- Thin Straw Delayed swallow initiation-vallecula;Penetration/Apiration after swallow Pharyngeal Material enters airway, passes BELOW cords then ejected out Pharyngeal- Puree -- Pharyngeal -- Pharyngeal- Mechanical Soft -- Pharyngeal -- Pharyngeal- Regular -- Pharyngeal -- Pharyngeal- Multi-consistency -- Pharyngeal -- Pharyngeal- Pill -- Pharyngeal -- Pharyngeal Comment --  CHL IP CERVICAL ESOPHAGEAL PHASE 02/01/2016 Cervical Esophageal Phase WFL Pudding Teaspoon -- Pudding Cup -- Honey Teaspoon -- Honey Cup -- Nectar Teaspoon -- Nectar Cup -- Nectar Straw -- Thin Teaspoon -- Thin Cup -- Thin Straw -- Puree -- Mechanical Soft -- Regular -- Multi-consistency -- Pill -- Cervical Esophageal Comment -- No flowsheet  data found. Shonna Chock 02/01/2016, 2:26 PM  Celia B. Quentin Ore [facility], [place] [phone]              Review of Systems  Constitutional: Negative for fever and chills.  Musculoskeletal: Negative for myalgias.  Neurological:       Recent acute CVA   Blood pressure 171/90, pulse 73, temperature 98.6 F (37 C), temperature source Oral, resp. rate 24, height 6' 2" (1.88 m), weight 92.67 kg (204 lb 4.8 oz), SpO2 93 %. Physical Exam  Vitals reviewed. Constitutional: He is oriented to person, place, and time. He appears well-developed and well-nourished. No distress.  HENT:  Head: Normocephalic and atraumatic.  Right Ear: External ear normal.  Left Ear: External ear normal.  Eyes: Conjunctivae are normal. No scleral icterus.  Neck: Normal range of motion. Neck supple. No tracheal deviation present.  Cardiovascular: Normal rate and normal heart sounds.   Respiratory: Effort normal and breath sounds normal. No stridor. No respiratory distress. He has no wheezes.  GI: Soft. He exhibits no distension.  Musculoskeletal: He exhibits edema (trace to 1+ edema b/l LE). He exhibits no tenderness.  Neurological: He is alert and oriented to person, place, and time. He exhibits normal muscle tone.  Speech is a little difficult to understand  Skin: Skin is warm and dry. No rash noted. He is not diaphoretic. No erythema. No pallor.  Multiple u/e bruises in various stages of healing; RLE - bandage of RLE shin without cellulitis; post mid thigh area of ecchymosis, about 9-10 cm long by 5 cm wide, rectangular shape, a little firm, but thigh completely soft. No fluctuance. Appears to have several small needle puncture sites along r anteromedial thigh  Psychiatric: He has a normal mood and affect. His behavior is normal. Judgment and thought content normal.    Assessment/Plan: Right posterior thigh ecchymosis with minor hematoma Recent right lower extremity laser surgery Acute  stroke Hypertension Atrial fibrillation Coronary artery disease Hyperlipidemia  I've asked the nurse to ask the family in the morning exactly what  he had done to his right lower extreme. It appears that there are surgical pen markings on his scan in the right thigh along with possible injection sites. This may just be trauma related to that laser procedure complicated by systemic blood thinning. Nonetheless the hematoma is not that impressive. The thigh is soft. It should resolve over time. It does not appear to be expanding. His hemoglobin is essentially stable from admission. It does not appear to be cellulitis. We will reevaluate it in the morning. Repeat cbc  Leighton Ruff. Redmond Pulling, MD, FACS General, Bariatric, & Minimally Invasive Surgery [facility], [place]   [facility] M 02/02/2016, 12:30 AM

## 2016-02-02 NOTE — ED Notes (Signed)
Pt having some high BP after tPA started, Labetalol IV given prior to start tPA per Dr. Wendee Beavers order. Admitting MD and EDP aware of high BP no new order gotten.

## 2016-02-02 NOTE — Progress Notes (Signed)
STROKE TEAM PROGRESS NOTE   HISTORY OF PRESENT ILLNESS Arthur [name] is a 82 y.o. male with hx of afib who was off coumadin for a laser procedure on his right leg (compressible side). He restarted his coumadin yesterday and has had 2 doses since. Today just after 7:30 he had trouble using his right hand and then acute onset of dysarthria noticed by him and his wife. He came into the ED as code stroke and on admission he told us he had had a superficial laser surgery on the right leg that involved a minor superficial incision. I decided at the CT scanner to treat with ivTPA and at that time his NIHSS = 5 for a mild drift of right arm (1), right leg (2), dysarthria (1) and mild but visible right facial droop (1). Because he had taken his coumadin x 2 doses we waited for the INR which took at least 30 minutes to be resulted. By that time all his deficits had resolved save for a moderate degree of dysarthria making his NIHSS =1; because of this improvement I had a new discussion regarding the risks vs benefits of ivTPA with the patient. By that time his daughter and wife had reached the room and became involved in the discussion, asking multiple questions. Arthur [name], the RR nurse and myself did our best to answer all the questions, constantly emphasizing the importance of making a quick decision in order to lower the overall risk of intracranial bleeding. We also were very honest with family in telling them that while the surgery in the leg was minor and could be compressed in the case of bleeding due to tPA, the bleeding could be significant and disabling. We also made it clear that there could be lethal intracranial bleeding. After a rather long discussion fueled by patient and family, he finally made the decision to be treated given that his dysarthria was clearly disabling. The combination of the prolonged time to INR result and the family-initiated long discussion of risks and benefits delayed tPA  administration significantly although it was clearly given within the time window. Apparently there was also a long delay at the time the patient was picked up by the ambulance, the family told us in the room.   LKW: 7:30pm tpa given?: yes   SUBJECTIVE (INTERVAL HISTORY) Patient's son and daughter at the bedside. Reviewed images with family.I had a long d/w about recent stroke, risk for recurrent stroke/TIAs, and restarting warfarin with an aspirin bridge. Echocardiogram showed mildly dilated aortic root, ascending aorta and aortic arch and CTA has been ordered. Last CTA 2016 showed Ascending aortic aneurysmal dilatation measuring 4.6 cm.    OBJECTIVE Temp:  [97.5 F (36.4 C)-99.1 F (37.3 C)] 98.4 F (36.9 C) (03/18 0838) Pulse Rate:  [50-125] 72 (03/18 1000) Cardiac Rhythm:  [-] Atrial fibrillation (03/18 0800) Resp:  [15-36] 28 (03/18 1000) BP: (123-189)/(70-112) 137/75 mmHg (03/18 1000) SpO2:  [87 %-100 %] 96 % (03/18 1000)  CBC:  Recent Labs Lab 01/31/16 2151  02/01/16 2205 02/02/16 0733  WBC 9.0  --  14.2* 10.8*  NEUTROABS 3.0  --  5.4  --   HGB 14.3  < > 14.8 13.8  HCT 43.9  < > 45.4 42.5  MCV 98.9  --  97.8 98.6  PLT 133*  --  127* 120*  < > = values in this interval not displayed.  Basic Metabolic Panel:   Recent Labs Lab 01/31/16 2151 01/31/16 2157 02/02/16 0733  NA 138  138 138  K 4.7 4.6 4.2  CL 102 102 106  CO2 24  --  23  GLUCOSE 142* 137* 89  BUN 20 25* 15  CREATININE 1.42* 1.30* 1.14  CALCIUM 9.1  --  8.7*    Lipid Panel:     Component Value Date/Time   CHOL 161 02/01/2016 0400   TRIG 58 02/01/2016 0400   HDL 46 02/01/2016 0400   CHOLHDL 3.5 02/01/2016 0400   VLDL 12 02/01/2016 0400   LDLCALC 103* 02/01/2016 0400   HgbA1c:  Lab Results  Component Value Date   HGBA1C 5.9* 02/01/2016   Urine Drug Screen:     Component Value Date/Time   LABOPIA NONE DETECTED 02/01/2016 0011   COCAINSCRNUR NONE DETECTED 02/01/2016 0011   LABBENZ  NONE DETECTED 02/01/2016 0011   AMPHETMU NONE DETECTED 02/01/2016 0011   THCU NONE DETECTED 02/01/2016 0011   LABBARB NONE DETECTED 02/01/2016 0011      IMAGING  Ct Head Wo Contrast 01/31/2016   1. No acute findings.  No intracranial hemorrhage or edema.  2. 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  3. Meningioma overlying the left occipital lobe, stable in size, now partially calcified.   EEG - This awake and drowsy EEG is within normal limits for age. A normal EEG does not exclude a clinical diagnosis of epilepsy. Clinical correlation is advised.  2D echo - Left ventricle: The cavity size was normal. Wall thickness was  increased in a pattern of mild LVH. There was mild focal basal  hypertrophy of the septum. Systolic function was normal. The  estimated ejection fraction was in the range of 55% to 60%. Wall  motion was normal; there were no regional wall motion  abnormalities. - Aortic valve: Valve mobility was restricted. There was mild  stenosis. There was mild regurgitation. - Aortic root: The aortic root was mildly dilated. - Ascending aorta: The ascending aorta was mildly dilated. - Mitral valve: Calcified annulus. There was mild regurgitation. - Left atrium: The atrium was severely dilated. - Right atrium: The atrium was moderately dilated. - Pulmonary arteries: Systolic pressure was mildly increased. PA  peak pressure: 36 mm Hg (S). Impressions: - Normal LV systolic function; biatrial enlargement; calcified  aortic valve with mild AS (mean gradient 17 mmHg); mild AI; mild  MR; trace TR with mildly elevated pulmonary pressure; mildly  dilated aortic root, ascending aorta and aortic arch; suggest CTA  or MRA to further assess.   MRI Head 02/02/2016 1. Patchy small volume acute ischemic nonhemorrhagic left MCA territory infarcts as above. 2. Remote bilateral cerebellar infarcts, right larger than left. 3. Moderate  generalized age-related cerebral atrophy. 4. 2.1 cm left parietal convexity meningioma. No associated edema.  MRA Head  02/02/2016 1. No definite large or proximal arterial branch occlusion within the intracranial circulation. 2. Question proximal right M2 branch occlusion, not entirely certain given the absence of acute or chronic infarct within this territory. Finding may be related to motion artifact on this exam. 3. Probable proximal nonocclusive moderate M2 stenoses bilaterally. 4. Diminutive vertebrobasilar system with fetal origin of the right PCA. Left PCA also largely supplied via a prominent left posterior communicating artery. 5. Nonvisualization of the right PICA, which may be occluded given the remote right cerebellar infarct.   PHYSICAL EXAM  Temp:  [97.5 F (36.4 C)-99.1 F (37.3 C)] 98.4 F (36.9 C) (03/18 0838) Pulse Rate:  [50-125] 72 (03/18 1000) Resp:  [15-36] 28 (03/18 1000) BP: (123-189)/(70-112) 137/75  mmHg (03/18 1000) SpO2:  [87 %-100 %] 96 % (03/18 1000)  General - Well nourished, well developed, in no apparent distress.  Ophthalmologic - Fundi not visualized due to eye movement.  Cardiovascular - Regular rate and rhythm.  Mental Status -  Level of arousal and orientation to time, place, and person were intact. Language including expression, naming, comprehension was assessed and found intact, however, difficulty with repetition with dysarthria. He is able to correct himself however and repeat/name correctly. He had difficulty with "stethoscope" but was able to correct himself and then pronounce it correctly. Fund of Knowledge was assessed and was intact.  Cranial Nerves II - XII - II - Visual field intact OU. III, IV, VI - Extraocular movements intact. V - Facial sensation intact bilaterally. VII - right nasolabial fold flattening. VIII - Hearing & vestibular intact bilaterally. X - Palate elevates symmetrically. XI - Chin turning & shoulder shrug  intact bilaterally. XII - Tongue protrusion intact.  Motor Strength - The patient's strength was normal in all extremities and pronator drift was absent. Right leg dressing. Bulk was normal and fasciculations were absent.   Motor Tone - Muscle tone was assessed at the neck and appendages and was normal.  Reflexes - The patient's reflexes were 1+ in all extremities and he had no pathological reflexes.  Sensory - Light touch, temperature/pinprick were assessed and were symmetrical.    Coordination - The patient had normal movements in the hands with no ataxia or dysmetria.  Tremor was absent.  Gait and Station - not tested due to right leg wound and safety concerns after tPA.   ASSESSMENT/PLAN [name] is a 82 y.o. male with history of coronary artery disease, hyperlipidemia, atrial fibrillation, TIA, hypertension, and  previous MI,  presenting with dysarthria and right arm difficulties. The patient received IV TPA 83 mg on Thursday, 07/02/2016 at 2300 hrs.  Stroke: possible Left brain infarct likely embolic secondary to atrial fibrillation with subtherapeutic INR. Pt was off coumadin for right leg procedure without lovenox bridge, just restarted coumadin for a day. When pt on coumadin, his INR stable within 2-3. Therefore, will continue his coumadin with ASA bridge this time.  Resultant  Dysarthria and right facial asymmetry  MRI  Patchy small volume acute ischemic nonhemorrhagic left MCA territory infarcts. Remote bilateral cerebellar infarcts  MRA Question proximal right M2 branch occlusion. Nonvisualization of the right PICA.  EEG - WNL  2D Echo EF 55-60%. Dilated aortic root - CTA recommended.  CTA - Pending for further evaluation of dilated aortic root  LDL 103  HgbA1c 5.9  VTE prophylaxis - SCDs Diet regular Room service appropriate?: Yes; Fluid consistency:: Thin  warfarin daily prior to admission, now on No antithrombotic - secondary to TPA. Plan for coumadin  with ASA 325mg  bridge 24h after tPA if no bleeding on MRI. Patient can discontinue ASA when therapeutic on warfarin.   Patient counseled to be compliant with his antithrombotic medications  Ongoing aggressive stroke risk factor management  Therapy recommendations: Pending  Disposition: Pending  afib on coumadin   subtherapeutic INR due to off coumadin for procedure  Stable INR at home with coumadin  Will restart coumadin with ASA bridge 24 h after tPA if no bleeding  Resume metoprolol for rate control - done  Pharmacy consult for warfarin dosing  Hypertension  Blood pressure somewhat high  Resumed home metoprolol  Permissive hypertension (OK if < 220/120) but gradually normalize in 5-7 days Will start Norvasc 5  mg daily  Hyperlipidemia  Home meds: No lipid lowering medications prior to admission  LDL 103, goal < 70  Add Lipitor 20 mg daily  Continue statin at discharge  Other Stroke Risk Factors  Advanced age  Cigarette smoker, quit smoking 48 years ago.  ETOH use  Hx stroke/TIA  Coronary artery disease  Daily prednisone now and prior to admission.   Other Active Problems  Renal insufficiency Cre 1.30. BMP in the morning  Hospital day # 2    Personally examined patient and images, and have participated in and made any corrections needed to history, physical, neuro exam,assessment and plan as stated above.  I have personally obtained the history, evaluated lab date, reviewed imaging studies and agree with radiology interpretations.    Sarina Ill, MD Neurology [phone] [facility]       To contact Stroke Continuity provider, please refer to [email or web address] After hours, contact General Neurology

## 2016-02-02 NOTE — Progress Notes (Signed)
Attempted Report to5W

## 2016-02-02 NOTE — Progress Notes (Signed)
Patient ID: Arthur [name], male   DOB: Aug 12, 1925, 82 y.o.   MRN: [id number]  Patient reports that he had a endovascular laser procedure for venous insufficiency.  With anticoagulation for his stroke, he likely had some venous bleeding.    The bruising and hematoma seem unchanged.   No treatment needed.  He is asymptomatic.   May use warm compresses if desired.  Will sign off.  Imogene Burn. Georgette Dover, MD, [facility]  General/ Trauma Surgery  02/02/2016 8:04 AM

## 2016-02-02 NOTE — Progress Notes (Signed)
PT Cancellation Note  Patient Details Name: Arthur [name] MRN: [id number] DOB: 14-Jul-1925   Cancelled Treatment:    Reason Eval/Treat Not Completed: Patient not medically ready.  Patient still on strict bedrest.  Will need increased activity orders to begin PT. Thank you.   Shanna Cisco 02/02/2016, 1:53 PM

## 2016-02-03 LAB — BASIC METABOLIC PANEL
Anion gap: 9 (ref 5–15)
BUN: 18 mg/dL (ref 6–20)
CALCIUM: 8.7 mg/dL — AB (ref 8.9–10.3)
CO2: 24 mmol/L (ref 22–32)
CREATININE: 1.18 mg/dL (ref 0.61–1.24)
Chloride: 103 mmol/L (ref 101–111)
GFR calc Af Amer: >60 mL/min (ref >60)
GFR calc non Af Amer: 53 mL/min — ABNORMAL LOW (ref >60)
GLUCOSE: 98 mg/dL (ref 65–99)
Potassium: 4.1 mmol/L (ref 3.5–5.1)
Sodium: 136 mmol/L (ref 135–145)

## 2016-02-03 LAB — HEPARIN LEVEL (UNFRACTIONATED): HEPARIN UNFRACTIONATED: 0.2 [IU]/mL — AB (ref 0.30–0.70)

## 2016-02-03 LAB — GLUCOSE, CAPILLARY: GLUCOSE-CAPILLARY: 94 mg/dL (ref 65–99)

## 2016-02-03 LAB — PROTIME-INR
INR: 1.37 (ref 0.00–1.49)
PROTHROMBIN TIME: 17 s — AB (ref 11.6–15.2)

## 2016-02-03 MED ORDER — APIXABAN 5 MG PO TABS: 5.0000 mg | ORAL_TABLET | Freq: Two times a day (BID) | ORAL | Status: DC

## 2016-02-03 MED ORDER — AMLODIPINE BESYLATE 5 MG PO TABS: 5.0000 mg | ORAL_TABLET | Freq: Every day | ORAL | Status: DC

## 2016-02-03 MED ORDER — ATORVASTATIN CALCIUM 20 MG PO TABS: 20.0000 mg | ORAL_TABLET | Freq: Every day | ORAL | Status: DC

## 2016-02-03 MED ORDER — APIXABAN 5 MG PO TABS
5.0000 mg | ORAL_TABLET | Freq: Two times a day (BID) | ORAL | Status: DC
  Administered 2016-02-03 – 2016-02-04 (×3): 5 mg via ORAL
  Filled 2016-02-03 (×3): qty 1

## 2016-02-03 MED ORDER — HEPARIN (PORCINE) IN NACL 100-0.45 UNIT/ML-% IJ SOLN
1000.0000 [IU]/h | INTRAMUSCULAR | Status: DC
  Administered 2016-02-03: 1000 [IU]/h via INTRAVENOUS
  Filled 2016-02-03: qty 250

## 2016-02-03 NOTE — Evaluation (Signed)
Physical Therapy Evaluation Patient Details Name: Arthur [name] MRN: [id number] DOB: 1925-04-26 Today's Date: 02/03/2016   History of Present Illness  82 y.o. male with history of coronary artery disease, hyperlipidemia, atrial fibrillation, TIA, hypertension, and previous MI, presenting with dysarthria and right arm difficulties. Pt was admitted code stroke (L MCA territory) and administered tPA. He had previously undergone a laser vein surgery RLE 01-28-16.  Clinical Impression  Pt admitted with above diagnosis. Pt currently with functional limitations due to the deficits listed below (see PT Problem List). On eval, pt required min assist with all functional mobility, including ambulation with RW 25 feet. Gait distance limited by fatigue. This was patient's first time OOB since admission 01-31-16 due to previous bedrest orders. Recommend pt remain inpatient one additional night for further monitoring of mobility and improved activity tolerance. Pt will benefit from skilled PT to increase his independence and safety with mobility to allow discharge to the venue listed below.  Pt reports his bedroom and bathroom are on 2nd floor, but sofa bed and full bath are available for use on main level. Pt advised to use level entry through garage to enter house and reside on main level until mobility improves through [place].     Follow Up Recommendations Home health PT;Supervision/Assistance - 24 hour    Equipment Recommendations  Rolling walker with 5" wheels;3in1 (PT)    Recommendations for Other Services OT consult     Precautions / Restrictions Precautions Precautions: Fall      Mobility  Bed Mobility Overal bed mobility: Needs Assistance Bed Mobility: Supine to Sit     Supine to sit: Min assist     General bed mobility comments: bed flat, no rails, assist to elevate trunk  Transfers Overall transfer level: Needs assistance Equipment used: Rolling walker (2 wheeled) Transfers: Sit  to/from [facility] Sit to Stand: Min assist Stand pivot transfers: Min assist       General transfer comment: verbal cues for hand placement  Ambulation/Gait Ambulation/Gait assistance: Min guard Ambulation Distance (Feet): 25 Feet Assistive device: Rolling walker (2 wheeled) Gait Pattern/deviations: Step-through pattern;Wide base of support;Decreased stride length Gait velocity: decreased Gait velocity interpretation: Below normal speed for age/gender General Gait Details: unsteady, fatigues quickly  Stairs            Wheelchair Mobility    Modified Rankin (Stroke Patients Only) Modified Rankin (Stroke Patients Only) Pre-Morbid Rankin Score: No symptoms Modified Rankin: Moderate disability     Balance Overall balance assessment: Needs assistance Sitting-balance support: Feet supported;No upper extremity supported Sitting balance-Leahy Scale: Good     Standing balance support: No upper extremity supported Standing balance-Leahy Scale: Poor Standing balance comment: RW utilized for gait to assist with balance.                             Pertinent Vitals/Pain Pain Assessment: No/denies pain    Home Living Family/patient expects to be discharged to:: Private residence Living Arrangements: Spouse/significant other Available Help at Discharge: Family;Available 24 hours/day Type of Home: House Home Access: Level entry (in garage)     Home Layout: Two level;Able to live on main level with bedroom/bathroom Home Equipment: None      Prior Function Level of Independence: Independent               Hand Dominance   Dominant Hand: Right    Extremity/Trunk Assessment   Upper Extremity Assessment: Defer to OT  evaluation (decreased grip strength noted on right)           Lower Extremity Assessment: Generalized weakness;RLE deficits/detail;LLE deficits/detail (strength appears symmetrical BLE, with weakness noted) RLE  Deficits / Details: grossly 4/5 LLE Deficits / Details: grossly 4/5     Communication   Communication: No difficulties  Cognition Arousal/Alertness: Awake/alert Behavior During Therapy: WFL for tasks assessed/performed Overall Cognitive Status: Within Functional Limits for tasks assessed                      General Comments      Exercises        Assessment/Plan    PT Assessment Patient needs continued PT services  PT Diagnosis Abnormality of gait;Generalized weakness   PT Problem List Decreased strength;Decreased activity tolerance;Decreased balance;Decreased mobility;Decreased knowledge of use of DME  PT Treatment Interventions DME instruction;Gait training;Stair training;Functional mobility training;Therapeutic activities;Therapeutic exercise;Patient/family education;Balance training   PT Goals (Current goals can be found in the Care Plan section) Acute Rehab PT Goals Patient Stated Goal: home PT Goal Formulation: With patient/family Time For Goal Achievement: 02/17/16 Potential to Achieve Goals: Good    Frequency Min 3X/week   Barriers to discharge        Co-evaluation               End of Session Equipment Utilized During Treatment: Gait belt Activity Tolerance: Patient tolerated treatment well Patient left: in chair;with family/visitor present;with call bell/phone within reach Nurse Communication: Mobility status         Time: 1314-1400 PT Time Calculation (min) (ACUTE ONLY): 46 min   Charges:   PT Evaluation $PT Eval High Complexity: 1 Procedure PT Treatments $Gait Training: 8-22 mins $Therapeutic Activity: 8-22 mins   PT G Codes:        Lorriane Shire 02/03/2016, 2:31 PM

## 2016-02-03 NOTE — Discharge Instructions (Signed)
1. Report any abnormal bleeding to your physicians immediately. 2. Make arrangements to be followed by a thoracic surgeon. 3. Have your aorta evaluated every year or two. ------------------------------------------------------------------------------------------------------------------------------------------------------- Information on my medicine - ELIQUIS (apixaban)  This medication education was reviewed with me or my healthcare representative as part of my discharge preparation.  The pharmacist that spoke with me during my hospital stay was:  Zoejane Gaulin, RPH  Why was Eliquis prescribed for you? Eliquis was prescribed for you to reduce the risk of a blood clot forming that can cause a stroke if you have a medical condition called atrial fibrillation (a type of irregular heartbeat).  What do You need to know about Eliquis ? Take your Eliquis 5mg  TWICE DAILY - one tablet in the morning and one tablet in the evening with or without food. If you have difficulty swallowing the tablet whole please discuss with your pharmacist how to take the medication safely.  Take Eliquis exactly as prescribed by your doctor and DO NOT stop taking Eliquis without talking to the doctor who prescribed the medication.  Stopping may increase your risk of developing a stroke.  Refill your prescription before you run out.  After discharge, you should have regular check-up appointments with your healthcare provider that is prescribing your Eliquis.  In the future your dose may need to be changed if your kidney function or weight changes by a significant amount or as you get older.  What do you do if you miss a dose? If you miss a dose, take it as soon as you remember on the same day and resume taking twice daily.  Do not take more than one dose of ELIQUIS at the same time to make up a missed dose.  Important Safety Information A possible side effect of Eliquis is bleeding. You should call your healthcare  provider right away if you experience any of the following: ? Bleeding from an injury or your nose that does not stop. ? Unusual colored urine (red or dark brown) or unusual colored stools (red or black). ? Unusual bruising for unknown reasons. ? A serious fall or if you hit your head (even if there is no bleeding).  Some medicines may interact with Eliquis and might increase your risk of bleeding or clotting while on Eliquis. To help avoid this, consult your healthcare provider or pharmacist prior to using any new prescription or non-prescription medications, including herbals, vitamins, non-steroidal anti-inflammatory drugs (NSAIDs) and supplements.  This website has more information on Eliquis (apixaban): http://[email or web address].com/eliquis/home

## 2016-02-03 NOTE — Discharge Summary (Signed)
Stroke Discharge Summary  Patient ID: Arthur [name]   MRN: [id number]      DOB: [birth year]  Date of Admission: 01/31/2016 Date of Discharge: 02/04/2016  Attending Physician:  Rosalin Hawking, MD, Stroke MD Consulting Physician(s):     Pharmacy  Patient's PCP:  [name], MD  DISCHARGE DIAGNOSIS: Patchy small volume acute ischemic nonhemorrhagic left MCA territory infarcts. Active Problems:   Stroke due to embolism of left middle cerebral artery (HCC)   Stroke (cerebrum) (HCC)  BMI: Body mass index is 26.22 kg/(m^2).  Past Medical History  Diagnosis Date  . Coronary artery disease     MI, atherectomy 1993  . Hyperlipidemia   . Arrhythmia     1995  . Atrial fibrillation ([place])   . History of transient ischemic attack (TIA) 2005    double vision  . Hypertension   . History of gallstones   . Myocardial infarct ([place]) 12/98    inferior  . Eczema   . Elevated LFTs   . Skin cancer   . DDD (degenerative disc disease)   . Pneumonia     long time ago  . Varicose veins    Past Surgical History  Procedure Laterality Date  . Cardiac catheterization  04/08/99    LAD 70% INTER 40-50% RCA 30-40%  . Appendectomy    . Coronary angioplasty  04/08/99    LAD  . Coronary angioplasty with stent placement  04/08/99    LAD  . Coronary angioplasty  10/17/97    RCA   . Melanoma excision  2013    lt arm  . Tonsillectomy    . Amputation Right 05/10/2013    Procedure: RIGHT SECOND TOE AMPUTATION;  Surgeon: Hessie Dibble, MD;  Location: [place];  Service: Orthopedics;  Laterality: Right;  . Melanoma excision Left 08/23/2013    Procedure: MELANOMA EXCISION;  Surgeon: Pedro Earls, MD;  Location: [facility];  Service: General;  Laterality: Left;  . Video bronchoscopy Bilateral 05/07/2015    Procedure: VIDEO BRONCHOSCOPY WITHOUT FLUORO;  Surgeon: Rigoberto Noel, MD;  Location: [place];  Service: Cardiopulmonary;  Laterality: Bilateral;      Medication List    STOP  taking these medications        losartan 50 MG tablet  Commonly known as:  COZAAR     warfarin 5 MG tablet  Commonly known as:  COUMADIN      TAKE these medications        albuterol 108 (90 Base) MCG/ACT inhaler  Commonly known as:  PROVENTIL HFA;VENTOLIN HFA  Inhale 2 puffs into the lungs every 6 (six) hours as needed for wheezing or shortness of breath.     amLODipine 5 MG tablet  Commonly known as:  NORVASC  Take 1 tablet (5 mg total) by mouth daily.     apixaban 5 MG Tabs tablet  Commonly known as:  ELIQUIS  Take 1 tablet (5 mg total) by mouth 2 (two) times daily.     ezetimibe 10 MG tablet  Commonly known as:  ZETIA  Take 1 tablet (10 mg total) by mouth daily.     HYDROcodone-acetaminophen 5-325 MG tablet  Commonly known as:  NORCO/VICODIN  Take 1 tablet by mouth every 6 (six) hours as needed for moderate pain.     metoprolol tartrate 25 MG tablet  Commonly known as:  LOPRESSOR  TAKE 1 TABLET DAILY     predniSONE 10 MG tablet  Commonly  known as:  DELTASONE  Take 1 tablet (10 mg total) by mouth daily.        LABORATORY STUDIES CBC    Component Value Date/Time   WBC 10.4 02/04/2016 0503   WBC 7.9 10/25/2015 0838   RBC 4.03* 02/04/2016 0503   RBC 4.36 10/25/2015 0838   HGB 13.3 02/04/2016 0503   HGB 13.7 10/25/2015 0838   HCT 39.5 02/04/2016 0503   HCT 42.4 10/25/2015 0838   PLT 121* 02/04/2016 0503   PLT 197 10/25/2015 0838   MCV 98.0 02/04/2016 0503   MCV 97.3 10/25/2015 0838   MCH 33.0 02/04/2016 0503   MCH 31.4 10/25/2015 0838   MCHC 33.7 02/04/2016 0503   MCHC 32.2 10/25/2015 0838   RDW 14.9 02/04/2016 0503   RDW 15.7* 10/25/2015 0838   LYMPHSABS 6.7* 02/01/2016 2205   LYMPHSABS 4.3* 10/25/2015 0838   MONOABS 2.1* 02/01/2016 2205   MONOABS 0.8 10/25/2015 0838   EOSABS 0.0 02/01/2016 2205   EOSABS 0.0 10/25/2015 0838   BASOSABS 0.0 02/01/2016 2205   BASOSABS 0.1 10/25/2015 0838   CMP    Component Value Date/Time   NA 134* 02/04/2016  0503   NA 140 10/25/2015 0839   K 3.9 02/04/2016 0503   K 5.4* 10/25/2015 0839   CL 104 02/04/2016 0503   CO2 24 02/04/2016 0503   CO2 22 10/25/2015 0839   GLUCOSE 106* 02/04/2016 0503   GLUCOSE 128 10/25/2015 0839   BUN 22* 02/04/2016 0503   BUN 23.2 10/25/2015 0839   CREATININE 1.15 02/04/2016 0503   CREATININE 1.4* 10/25/2015 0839   CREATININE 1.51* 04/20/2013 1639   CALCIUM 8.3* 02/04/2016 0503   CALCIUM 8.9 10/25/2015 0839   PROT 6.9 01/31/2016 2151   PROT 6.7 10/25/2015 0839   ALBUMIN 3.4* 01/31/2016 2151   ALBUMIN 2.9* 10/25/2015 0839   AST 91* 01/31/2016 2151   AST 54* 10/25/2015 0839   ALT 118* 01/31/2016 2151   ALT 60* 10/25/2015 0839   ALKPHOS 84 01/31/2016 2151   ALKPHOS 117 10/25/2015 0839   BILITOT 0.8 01/31/2016 2151   BILITOT 0.93 10/25/2015 0839   GFRNONAA 55* 02/04/2016 0503   GFRNONAA 42* 04/20/2013 1639   GFRAA >60 02/04/2016 0503   GFRAA 48* 04/20/2013 1639   COAGS Lab Results  Component Value Date   INR 1.65* 02/04/2016   INR 1.37 02/03/2016   INR 1.18 01/31/2016   Lipid Panel    Component Value Date/Time   CHOL 161 02/01/2016 0400   TRIG 58 02/01/2016 0400   HDL 46 02/01/2016 0400   CHOLHDL 3.5 02/01/2016 0400   VLDL 12 02/01/2016 0400   LDLCALC 103* 02/01/2016 0400   HgbA1C  Lab Results  Component Value Date   HGBA1C 5.9* 02/01/2016   Cardiac Panel (last 3 results) No results for input(s): CKTOTAL, CKMB, TROPONINI, RELINDX in the last 72 hours. Urinalysis    Component Value Date/Time   COLORURINE YELLOW 02/01/2016 0011   APPEARANCEUR CLEAR 02/01/2016 0011   LABSPEC 1.010 02/01/2016 0011   PHURINE 6.0 02/01/2016 0011   GLUCOSEU NEGATIVE 02/01/2016 0011   GLUCOSEU NEGATIVE 09/03/2007 0905   HGBUR NEGATIVE 02/01/2016 0011   HGBUR negative 10/29/2010 0807   BILIRUBINUR NEGATIVE 02/01/2016 0011   KETONESUR NEGATIVE 02/01/2016 0011   PROTEINUR NEGATIVE 02/01/2016 0011   UROBILINOGEN 1.0 05/05/2015 0134   NITRITE NEGATIVE  02/01/2016 0011   LEUKOCYTESUR NEGATIVE 02/01/2016 0011   Urine Drug Screen     Component Value Date/Time   LABOPIA  NONE DETECTED 02/01/2016 0011   COCAINSCRNUR NONE DETECTED 02/01/2016 0011   LABBENZ NONE DETECTED 02/01/2016 0011   AMPHETMU NONE DETECTED 02/01/2016 0011   THCU NONE DETECTED 02/01/2016 0011   LABBARB NONE DETECTED 02/01/2016 0011    Alcohol Level    Component Value Date/Time   ETH <5 01/31/2016 2151     SIGNIFICANT DIAGNOSTIC STUDIES   Ct Head Wo Contrast 01/31/2016  1. No acute findings. No intracranial hemorrhage or edema.  2. 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  3. Meningioma overlying the left occipital lobe, stable in size, now partially calcified.    EEG - This awake and drowsy EEG is within normal limits for age. A normal EEG does not exclude a clinical diagnosis of epilepsy. Clinical correlation is advised.   2D echo - Left ventricle: The cavity size was normal. Wall thickness was  increased in a pattern of mild LVH. There was mild focal basal  hypertrophy of the septum. Systolic function was normal. The  estimated ejection fraction was in the range of 55% to 60%. Wall  motion was normal; there were no regional wall motion  abnormalities. - Aortic valve: Valve mobility was restricted. There was mild  stenosis. There was mild regurgitation. - Aortic root: The aortic root was mildly dilated. - Ascending aorta: The ascending aorta was mildly dilated. - Mitral valve: Calcified annulus. There was mild regurgitation. - Left atrium: The atrium was severely dilated. - Right atrium: The atrium was moderately dilated. - Pulmonary arteries: Systolic pressure was mildly increased. PA  peak pressure: 36 mm Hg (S). Impressions: - Normal LV systolic function; biatrial enlargement; calcified  aortic valve with mild AS (mean gradient 17 mmHg); mild AI; mild  MR; trace TR with mildly elevated pulmonary  pressure; mildly  dilated aortic root, ascending aorta and aortic arch; suggest CTA   or MRA to further assess.   CT Angio Chest Aorta with and without CM 02/02/2016 1. Biatrial enlargement with a small thrombus in the left atrial appendage. 2. No increase in size of the previously seen ascending thoracic aortic aneurysm, currently measuring 4.5 cm in maximum diameter. Recommend semi-annual imaging followup by CTA or MRA and referral to cardiothoracic surgery if not already obtained. This recommendation follows 2010 ACCF/AHA/AATS/ACR/ASA/SCA/SCAI/SIR/STS/SVM Guidelines for the Diagnosis and Management of Patients With Thoracic Aortic Disease. Circulation. 2010; 121: [id number] 3. Small to moderate-sized right pleural effusion and small left pleural effusion. 4. Bilateral dependent atelectasis, greater on the right. 5. Dense atheromatous coronary artery calcifications.    MRI Head 02/02/2016 1. Patchy small volume acute ischemic nonhemorrhagic left MCA territory infarcts as above. 2. Remote bilateral cerebellar infarcts, right larger than left. 3. Moderate generalized age-related cerebral atrophy. 4. 2.1 cm left parietal convexity meningioma. No associated edema.  MRA Head  02/02/2016 1. No definite large or proximal arterial branch occlusion within the intracranial circulation. 2. Question proximal right M2 branch occlusion, not entirely certain given the absence of acute or chronic infarct within this territory. Finding may be related to motion artifact on this exam. 3. Probable proximal nonocclusive moderate M2 stenoses bilaterally. 4. Diminutive vertebrobasilar system with fetal origin of the right PCA. Left PCA also largely supplied via a prominent left posterior communicating artery. 5. Nonvisualization of the right PICA, which may be occluded given the remote right cerebellar infarct.      HISTORY OF PRESENT ILLNESS Arthur [name] is an 82 y.o. male with hx of afib who was off  coumadin for a  laser procedure on his right leg (compressible side). He restarted his coumadin the day prior to admission and had 2 doses. On the day of admission just after 7:30 he had trouble using his right hand and then acute onset of dysarthria noticed by him and his wife. He came into the ED as code stroke and on admission he told us he had had a superficial laser surgery on the right leg that involved a minor superficial incision. It was decided at the CT scanner to treat with ivTPA and at that time his NIHSS = 5 for a mild drift of right arm (1), right leg (2), dysarthria (1) and mild but visible right facial droop (1). Because he had taken his coumadin x 2 doses we waited for the INR which took at least 30 minutes to be resulted. By that time all his deficits had resolved save for a moderate degree of dysarthria making his NIHSS =1; because of this improvement a new discussion was had regarding the risks vs benefits of ivTPA with the patient. By that time his daughter and wife had reached the room and became involved in the discussion, asking multiple questions. April, the RR nurse and neurology did their best to answer all the questions, constantly emphasizing the importance of making a quick decision in order to lower the overall risk of intracranial bleeding. They also were very honest with family in telling them that while the surgery in the leg was minor and could be compressed in the case of bleeding due to tPA, the bleeding could be significant and disabling. It was made it clear that there could be lethal intracranial bleeding. After a rather long discussion fueled by patient and family, he finally made the decision to be treated given that his dysarthria was clearly disabling. The combination of the prolonged time to INR result and the family-initiated long discussion of risks and benefits delayed tPA administration significantly although it was clearly given within the time window.  Apparently there was also a long delay at the time the patient was picked up by the ambulance, the family reported in the room.   LKW: 7:30pm tpa given?: yes  HOSPITAL COURSE Arthur [name] is an 82 y.o. male with history of coronary artery disease, hyperlipidemia, atrial fibrillation, TIA, hypertension, and previous MI, presenting with dysarthria and right arm difficulties. The patient received IV TPA 83 mg on Thursday, 07/02/2016 at 2300 hrs.  Stroke: possible Left brain infarct likely embolic secondary to atrial fibrillation with subtherapeutic INR. Pt was off coumadin for right leg procedure without lovenox bridge, just restarted coumadin for a day. Heparin was started due to an atrial thrombus seen on CTA. Neurology discussed eliquis with Dr. Burt Knack and patient was in agreement. Will start Eliquis and stop Heparin today. Discontinue Warfarin.   Resultant deficits resolved  MRI Patchy small volume acute ischemic nonhemorrhagic left MCA territory infarcts. Remote bilateral cerebellar infarcts  MRA Question proximal right M2 branch occlusion. Nonvisualization of the right PICA.  EEG - WNL  2D Echo EF 55-60%. Dilated aortic root - CTA recommended.  CTA - see results as noted above.  LDL 103  HgbA1c 5.9  VTE prophylaxis - SCDs  Diet regular Room service appropriate?: Yes; Fluid consistency:: Thin  warfarin daily prior to admission, No antithrombotic initially - secondary to TPA. Original plan was for coumadin with ASA 325mg  bridge 24h after tPA if no bleeding on MRI. Based on chest CTA findings a decision was made to switch to  Eliquis. A pharmacy consult was obtained for patient education and dosing.  Patient counseled to be compliant with his antithrombotic medications  Ongoing aggressive stroke risk factor management  Therapy recommendations: Pending  Disposition: Pending  afib on coumadin   subtherapeutic INR due to off coumadin for procedure  Stable INR  at home with coumadin  Change Coumadin to Eliquis as discussed with cardiology.  Resume metoprolol for rate control - done  Pharmacy consult for Eliquis dosing  Hypertension  Blood pressure somewhat high  Resumed home metoprolol  Permissive hypertension (OK if < 220/120) but gradually normalize in 5-7 days  Added Norvasc 5 mg daily  Hyperlipidemia  Home meds: No lipid lowering medications prior to admission  LDL 103, goal < 70  Added Lipitor 20 mg daily - Changed to Zetia due to mildly elevated liver function tests.  Continue statin at discharge  Other Stroke Risk Factors  Advanced age  Cigarette smoker, quit smoking 48 years ago.  ETOH use  Hx stroke/TIA  Coronary artery disease  Daily prednisone now and prior to admission.   Other Active Problems  Renal insufficiency improved - Cre 1.18 on 02/03/2016  DISCHARGE EXAM Blood pressure 111/64, pulse 58, temperature 97.6 F (36.4 C), temperature source Oral, resp. rate 18, height 6\' 2"  (1.88 m), weight 92.67 kg (204 lb 4.8 oz), SpO2 97 %.   General - Well nourished, well developed, in no apparent distress.  Ophthalmologic - Fundi not visualized due to eye movement.  Cardiovascular - Regular rate and rhythm.  Mental Status -  Level of arousal and orientation to time, place, and person were intact. Language including expression, naming, comprehension was assessed and found intact, dysarthria resolved. Fund of Knowledge was assessed and was intact.  Cranial Nerves II - XII - II - Visual field intact OU. III, IV, VI - Extraocular movements intact. V - Facial sensation intact bilaterally. VII - right nasolabial fold flattening. VIII - Hearing & vestibular intact bilaterally. X - Palate elevates symmetrically. XI - Chin turning & shoulder shrug intact bilaterally. XII - Tongue protrusion intact.  Motor Strength - The patient's strength was normal in all extremities and pronator drift was absent.  Right leg dressing. Bulk was normal and fasciculations were absent.  Motor Tone - Muscle tone was assessed at the neck and appendages and was normal.  Reflexes - The patient's reflexes were 1+ in all extremities and he had no pathological reflexes.  Sensory - Light touch, temperature/pinprick were assessed and were symmetrical.   Coordination - The patient had normal movements in the hands with no ataxia or dysmetria. Tremor was absent.  Gait and Station - not tested due to right leg wound and safety concerns after tPA.  Discharge Diet   Diet regular Room service appropriate?: Yes; Fluid consistency:: Thin liquids  DISCHARGE PLAN  Disposition:  Discharged home  Eliquis (apixaban) daily for secondary stroke prevention.  Follow-up [name], MD in 2 weeks.  Follow-up with Dr. Rosalin Hawking at the stroke clinic in 2 months.  Follow-up with cardiology (DR COOPER) as instructed  Have kidney function checked (BMP) at next office visit.  Home health physical therapy   Discharge instructions for the patient 1. Report any abnormal bleeding to your physicians immediately. 2. Make arrangements to be followed by a thoracic surgeon. 3. Have your aorta evaluated every year or two.  32 minutes were spent preparing discharge.  Mikey Bussing PA-C [facility] Pager [phone] 02/04/2016, 4:14 PM  I have personally examined  this patient, reviewed notes, independently viewed imaging studies, participated in medical decision making and plan of care. I have made any additions or clarifications directly to the above note. Agree with note above.   Antony Contras, MD Medical Director [facility] Pager: [phone] 02/04/2016 10:18 PM

## 2016-02-03 NOTE — Progress Notes (Signed)
ANTICOAGULATION CONSULT NOTE - Initial Consult  Pharmacy Consult for Heparin  Indication: Left atrial appendage thrombus  Allergies  Allergen Reactions  . Penicillins Hives    Patient Measurements: Height: 6\' 2"  (188 cm) Weight: 204 lb 4.8 oz (92.67 kg) IBW/kg (Calculated) : 82.2  Vital Signs: Temp: 97.6 F (36.4 C) (03/18 2236) Temp Source: Oral (03/18 2236) BP: 150/77 mmHg (03/18 2236) Pulse Rate: 64 (03/18 2236)  Labs:  Recent Labs  01/31/16 2151 01/31/16 2157 02/01/16 2205 02/02/16 0733  HGB 14.3 16.0 14.8 13.8  HCT 43.9 47.0 45.4 42.5  PLT 133*  --  127* 120*  APTT 29  --   --   --   LABPROT 15.2  --   --   --   INR 1.18  --   --   --   CREATININE 1.42* 1.30*  --  1.14    Estimated Creatinine Clearance: 52.1 mL/min (by C-G formula based on Cr of 1.14).   Medical History: Past Medical History  Diagnosis Date  . Coronary artery disease     MI, atherectomy 1993  . Hyperlipidemia   . Arrhythmia     1995  . Atrial fibrillation ([place])   . History of transient ischemic attack (TIA) 2005    double vision  . Hypertension   . History of gallstones   . Myocardial infarct ([place]) 12/98    inferior  . Eczema   . Elevated LFTs   . Skin cancer   . DDD (degenerative disc disease)   . Pneumonia     long time ago  . Varicose veins     Assessment: 82 y/o M on warfarin PTA for afib/stroke, he was a code stroke on 3/16 and received tPA, warfarin was re-started 3/18, he had a hematoma on his thigh that has resolved. CT Angio now reveals a thrombus in the left atrial appendage. Neuro has requested heparin per pharmacy.   Goal of Therapy:  Heparin level 0.3-0.5 units/ml Monitor platelets by anticoagulation protocol: Yes   Plan:  -No bolus -Start heparin drip at 1000 units/hr -1200 HL -Daily CBC/HL -Monitor for bleeding -Already on warfarin protocol   Narda Bonds 02/03/2016,3:04 AM

## 2016-02-03 NOTE — Evaluation (Signed)
Occupational Therapy Evaluation and Discharge Patient Details Name: Arthur [name] MRN: [id number] DOB: 1925/03/09 Today's Date: 02/03/2016    History of Present Illness 82 y.o. male with history of coronary artery disease, hyperlipidemia, atrial fibrillation, TIA, hypertension, and previous MI, presenting with dysarthria and right arm difficulties. Pt was admitted code stroke (L MCA territory) and administered tPA. He had previously undergone a laser vein surgery RLE 01-28-16.   Clinical Impression   This 82 yo male admitted with above presents to acute OT at a level safe to go home with his wife--mainly at a Min A to Independent level with maybe needing assistance for socks and LBB as well as off the toilet--I have discussed with them my recommendations for home modifications v. Just going with a 3n1 and they understand why. No further OT needs, we will sign off.    Follow Up Recommendations  No OT follow up    Equipment Recommendations  None recommended by OT (other than what I discussed with family in ADL section)       Precautions / Restrictions Precautions Precautions: None Restrictions Weight Bearing Restrictions: No      Mobility Bed Mobility               General bed mobility comments: Pt up in recliner upon my arrival  Transfers Overall transfer level: Needs assistance Equipment used: None Transfers: Sit to/from Stand Sit to Stand: Modified independent (Device/Increase time) (increased time and effort)         General transfer comment: Pt ambulated 100 feet without AD at independent level    Balance Overall balance assessment: Needs assistance Sitting-balance support: Feet supported;No upper extremity supported Sitting balance-Leahy Scale: Good     Standing balance support: No upper extremity supported Standing balance-Leahy Scale: Poor Standing balance comment: RW utilized for gait to assist with balance.                             ADL Overall ADL's : Needs assistance/impaired Eating/Feeding: Independent;Sitting   Grooming: Independent;Sitting;Standing   Upper Body Bathing: Independent;Sitting   Lower Body Bathing: Minimal assistance;Sit to/from stand (for lower legs and feet, Mod I sit<>stand due to increased time and effort)   Upper Body Dressing : Independent;Sitting   Lower Body Dressing: Minimal assistance (for socks, Mod I sit<>stand due to increased time and effort)   Toilet Transfer: Minimal assistance;Ambulation;Regular Toilet;Grab bars   Toileting- Clothing Manipulation and Hygiene: Independent (min A sit<>stand from low toilet) Toileting - Clothing Manipulation Details (indicate cue type and reason): I talked with pt, pt's wife, and their daughter about some home modifications that would probably be beneficial in the long run (handicapped height toilets, grab bars toliet, grab bar shower, shower seat, hand held shower head---all of this for safety                       Pertinent Vitals/Pain Pain Assessment: Faces Faces Pain Scale: Hurts little more Pain Location: left shoulder with flexion Pain Descriptors / Indicators: Aching;Sore Pain Intervention(s): Monitored during session     Hand Dominance Right   Extremity/Trunk Assessment Upper Extremity Assessment Upper Extremity Assessment: LUE deficits/detail LUE Deficits / Details: Decreased shoulder flexion LUE Coordination: decreased gross motor           Communication Communication Communication: No difficulties   Cognition Arousal/Alertness: Awake/alert Behavior During Therapy: WFL for tasks assessed/performed Overall Cognitive Status: Within Functional Limits for tasks assessed  Home Living Family/patient expects to be discharged to:: Private residence Living Arrangements: Spouse/significant other Available Help at Discharge: Family;Available 24 hours/day Type of Home:  House Home Access: Level entry (garage)     Home Layout: Two level;Able to live on main level with bedroom/bathroom Alternate Level Stairs-Number of Steps: flight Alternate Level Stairs-Rails: Right Bathroom Shower/Tub: Tub/shower unit;Curtain (downstairs; upstairs walk in shower with door)   Bathroom Toilet: Standard     Home Equipment: None          Prior Functioning/Environment Level of Independence: Independent             OT Diagnosis: Generalized weakness         OT Goals(Current goals can be found in the care plan section) Acute Rehab OT Goals Patient Stated Goal: home tomorrow  OT Frequency:                End of Session Equipment Utilized During Treatment: Gait belt Nurse Communication:  (pt's wife asking about home bandage change for pt's RLE; NT: pt ready for bath when NT was ready)  Activity Tolerance: Patient tolerated treatment well Patient left: in chair;with call bell/phone within reach;with family/visitor present   Time: 0450-0530 OT Time Calculation (min): 40 min Charges:  OT General Charges $OT Visit: 1 Procedure OT Evaluation $OT Eval Moderate Complexity: 1 Procedure OT Treatments $Self Care/Home Management : 23-37 mins  Almon Register [id number] 02/03/2016, 6:22 PM

## 2016-02-03 NOTE — Progress Notes (Signed)
STROKE TEAM PROGRESS NOTE   HISTORY OF PRESENT ILLNESS Arthur [name] is a 82 y.o. male with hx of afib who was off coumadin for a laser procedure on his right leg (compressible side). He restarted his coumadin yesterday and has had 2 doses since. Today just after 7:30 he had trouble using his right hand and then acute onset of dysarthria noticed by him and his wife. He came into the ED as code stroke and on admission he told us he had had a superficial laser surgery on the right leg that involved a minor superficial incision. I decided at the CT scanner to treat with ivTPA and at that time his NIHSS = 5 for a mild drift of right arm (1), right leg (2), dysarthria (1) and mild but visible right facial droop (1). Because he had taken his coumadin x 2 doses we waited for the INR which took at least 30 minutes to be resulted. By that time all his deficits had resolved save for a moderate degree of dysarthria making his NIHSS =1; because of this improvement I had a new discussion regarding the risks vs benefits of ivTPA with the patient. By that time his daughter and wife had reached the room and became involved in the discussion, asking multiple questions. Arthur [name], the RR nurse and myself did our best to answer all the questions, constantly emphasizing the importance of making a quick decision in order to lower the overall risk of intracranial bleeding. We also were very honest with family in telling them that while the surgery in the leg was minor and could be compressed in the case of bleeding due to tPA, the bleeding could be significant and disabling. We also made it clear that there could be lethal intracranial bleeding. After a rather long discussion fueled by patient and family, he finally made the decision to be treated given that his dysarthria was clearly disabling. The combination of the prolonged time to INR result and the family-initiated long discussion of risks and benefits delayed tPA  administration significantly although it was clearly given within the time window. Apparently there was also a long delay at the time the patient was picked up by the ambulance, the family told us in the room.   LKW: 7:30pm tpa given?: yes   SUBJECTIVE (INTERVAL HISTORY) His speech is better. Spoke to Dr. Burt Knack this morning regardling starting Eliquis instead of [place]. Patient has been on Coumadin for many years,  He was started on heparin overnight after CTA showed an atrial thrombus. If we start eliquis, can stop the Heparin. Patient agrees. Also discussed with family  OBJECTIVE Temp:  [97.6 F (36.4 C)-98.7 F (37.1 C)] 98.3 F (36.8 C) (03/19 0524) Pulse Rate:  [38-81] 81 (03/19 0648) Cardiac Rhythm:  [-] Atrial fibrillation (03/19 0700) Resp:  [18-28] 18 (03/19 0524) BP: (106-168)/(67-107) 150/97 mmHg (03/19 0648) SpO2:  [94 %-98 %] 96 % (03/19 0524)  CBC:  Recent Labs Lab 01/31/16 2151  02/01/16 2205 02/02/16 0733  WBC 9.0  --  14.2* 10.8*  NEUTROABS 3.0  --  5.4  --   HGB 14.3  < > 14.8 13.8  HCT 43.9  < > 45.4 42.5  MCV 98.9  --  97.8 98.6  PLT 133*  --  127* 120*  < > = values in this interval not displayed.  Basic Metabolic Panel:   Recent Labs Lab 02/02/16 0733 02/03/16 0554  NA 138 136  K 4.2 4.1  CL 106 103  CO2 23 24  GLUCOSE 89 98  BUN 15 18  CREATININE 1.14 1.18  CALCIUM 8.7* 8.7*    Lipid Panel:     Component Value Date/Time   CHOL 161 02/01/2016 0400   TRIG 58 02/01/2016 0400   HDL 46 02/01/2016 0400   CHOLHDL 3.5 02/01/2016 0400   VLDL 12 02/01/2016 0400   LDLCALC 103* 02/01/2016 0400   HgbA1c:  Lab Results  Component Value Date   HGBA1C 5.9* 02/01/2016   Urine Drug Screen:     Component Value Date/Time   LABOPIA NONE DETECTED 02/01/2016 0011   COCAINSCRNUR NONE DETECTED 02/01/2016 0011   LABBENZ NONE DETECTED 02/01/2016 0011   AMPHETMU NONE DETECTED 02/01/2016 0011   THCU NONE DETECTED 02/01/2016 0011   LABBARB NONE  DETECTED 02/01/2016 0011      IMAGING  Ct Head Wo Contrast 01/31/2016   1. No acute findings.  No intracranial hemorrhage or edema.  2. 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  3. Meningioma overlying the left occipital lobe, stable in size, now partially calcified.   EEG - This awake and drowsy EEG is within normal limits for age. A normal EEG does not exclude a clinical diagnosis of epilepsy. Clinical correlation is advised.  2D echo - Left ventricle: The cavity size was normal. Wall thickness was  increased in a pattern of mild LVH. There was mild focal basal  hypertrophy of the septum. Systolic function was normal. The  estimated ejection fraction was in the range of 55% to 60%. Wall  motion was normal; there were no regional wall motion  abnormalities. - Aortic valve: Valve mobility was restricted. There was mild  stenosis. There was mild regurgitation. - Aortic root: The aortic root was mildly dilated. - Ascending aorta: The ascending aorta was mildly dilated. - Mitral valve: Calcified annulus. There was mild regurgitation. - Left atrium: The atrium was severely dilated. - Right atrium: The atrium was moderately dilated. - Pulmonary arteries: Systolic pressure was mildly increased. PA  peak pressure: 36 mm Hg (S). Impressions: - Normal LV systolic function; biatrial enlargement; calcified  aortic valve with mild AS (mean gradient 17 mmHg); mild AI; mild  MR; trace TR with mildly elevated pulmonary pressure; mildly  dilated aortic root, ascending aorta and aortic arch; suggest CTA  or MRA to further assess.   MRI Head 02/02/2016 1. Patchy small volume acute ischemic nonhemorrhagic left MCA territory infarcts as above. 2. Remote bilateral cerebellar infarcts, right larger than left. 3. Moderate generalized age-related cerebral atrophy. 4. 2.1 cm left parietal convexity meningioma. No associated edema.  MRA Head   02/02/2016 1. No definite large or proximal arterial branch occlusion within the intracranial circulation. 2. Question proximal right M2 branch occlusion, not entirely certain given the absence of acute or chronic infarct within this territory. Finding may be related to motion artifact on this exam. 3. Probable proximal nonocclusive moderate M2 stenoses bilaterally. 4. Diminutive vertebrobasilar system with fetal origin of the right PCA. Left PCA also largely supplied via a prominent left posterior communicating artery. 5. Nonvisualization of the right PICA, which may be occluded given the remote right cerebellar infarct.   CT Angio Chest Aorta with and without CM 02/02/2016 1. Biatrial enlargement with a small thrombus in the left atrial appendage. 2. No increase in size of the previously seen ascending thoracic aortic aneurysm, currently measuring 4.5 cm in maximum diameter. Recommend semi-annual imaging followup by CTA or MRA and referral to cardiothoracic surgery if not  already obtained. This recommendation follows 2010 ACCF/AHA/AATS/ACR/ASA/SCA/SCAI/SIR/STS/SVM Guidelines for the Diagnosis and Management of Patients With Thoracic Aortic Disease. Circulation. 2010; 121: [id number] 3. Small to moderate-sized right pleural effusion and small left pleural effusion. 4. Bilateral dependent atelectasis, greater on the right. 5. Dense atheromatous coronary artery calcifications.       PHYSICAL EXAM  Temp:  [97.6 F (36.4 C)-98.7 F (37.1 C)] 98.3 F (36.8 C) (03/19 0524) Pulse Rate:  [38-81] 81 (03/19 0648) Resp:  [18-28] 18 (03/19 0524) BP: (106-168)/(67-107) 150/97 mmHg (03/19 0648) SpO2:  [94 %-98 %] 96 % (03/19 0524)  General - Well nourished, well developed, in no apparent distress.  Ophthalmologic - Fundi not visualized due to eye movement.  Cardiovascular - Regular rate and rhythm.  Mental Status -  Level of arousal and orientation to time, place, and person were  intact. Language including expression, naming, comprehension was assessed and found intact, dysarthria resolved. Fund of Knowledge was assessed and was intact.  Cranial Nerves II - XII - II - Visual field intact OU. III, IV, VI - Extraocular movements intact. V - Facial sensation intact bilaterally. VII - right nasolabial fold flattening. VIII - Hearing & vestibular intact bilaterally. X - Palate elevates symmetrically. XI - Chin turning & shoulder shrug intact bilaterally. XII - Tongue protrusion intact.  Motor Strength - The patient's strength was normal in all extremities and pronator drift was absent. Right leg dressing. Bulk was normal and fasciculations were absent.   Motor Tone - Muscle tone was assessed at the neck and appendages and was normal.  Reflexes - The patient's reflexes were 1+ in all extremities and he had no pathological reflexes.  Sensory - Light touch, temperature/pinprick were assessed and were symmetrical.    Coordination - The patient had normal movements in the hands with no ataxia or dysmetria.  Tremor was absent.  Gait and Station - not tested due to right leg wound and safety concerns after tPA.   ASSESSMENT/PLAN [name] is a 82 y.o. male with history of coronary artery disease, hyperlipidemia, atrial fibrillation, TIA, hypertension, and  previous MI,  presenting with dysarthria and right arm difficulties. The patient received IV TPA 83 mg on Thursday, 07/02/2016 at 2300 hrs.  Stroke: possible Left brain infarct likely embolic secondary to atrial fibrillation with subtherapeutic INR. Pt was off coumadin for right leg procedure without lovenox bridge, just restarted coumadin for a day. Heparin was started due to an atrial thrombus seen on CTA by neuro hospitalist overnight. Discussed eliquis with Dr. Burt Knack and patient is in agreement. Will start Eliquis and stop Heparin today. Discontinue Warfarin.   Resultant  resolved  MRI  Patchy small  volume acute ischemic nonhemorrhagic left MCA territory infarcts. Remote bilateral cerebellar infarcts  MRA Question proximal right M2 branch occlusion. Nonvisualization of the right PICA.  EEG - WNL  2D Echo EF 55-60%. Dilated aortic root - CTA - as noted above  LDL 103  HgbA1c 5.9  VTE prophylaxis - SCDs Diet regular Room service appropriate?: Yes; Fluid consistency:: Thin  warfarin daily prior to admission, now on No antithrombotic - secondary to TPA. Now on Eliquis  Patient counseled to be compliant with his antithrombotic medications  Ongoing aggressive stroke risk factor management  Therapy recommendations: Home health physical therapy recommended  Disposition: Pending  afib on coumadin   subtherapeutic INR due to off coumadin for procedure  Stable INR at home with coumadin  Warfarin changed to Eliquis  Resume metoprolol for rate  control - done    Hypertension  Blood pressure somewhat high  Resumed home metoprolol  Permissive hypertension (OK if < 220/120) but gradually normalize in 5-7 days Resume home blood pressure medications including losartan  Hyperlipidemia  Home meds: No lipid lowering medications prior to admission  LDL 103, goal < 70  Add Lipitor 20 mg daily  Continue statin at discharge  Other Stroke Risk Factors  Advanced age  Cigarette smoker, quit smoking 48 years ago.  ETOH use  Hx stroke/TIA  Coronary artery disease  Daily prednisone now and prior to admission.   Other Active Problems  Renal insufficiency Cre 1.30. BMP in the morning  Hospital day # 55   82 year old male with atrial fibrillation on Coumadin since 1994. Embolic stroke in the setting of subtherapeutic Coumadin due to procedure. CTA(recommended for evaluation of enlarged aortic root on echo) showed a possible atrial thrombus and heparin was started by neuro hospitalist. Discussed Eliquis with Dr. Burt Knack this morning. Discussed with family and patient.   Started Eliquis per pharmacy and stopped heparin.  Personally examined patient and images, and have participated in and made any corrections needed to history, physical, neuro exam,assessment and plan as stated above.  I have personally obtained the history, evaluated lab date, reviewed imaging studies and agree with radiology interpretations.    Sarina Ill, MD Neurology [phone] [facility]       To contact Stroke Continuity provider, please refer to [email or web address] After hours, contact General Neurology

## 2016-02-03 NOTE — Progress Notes (Signed)
ANTICOAGULATION CONSULT NOTE - Initial Consult  Pharmacy Consult for Heparin/Warfarin/Apixaban Indication: Left atrial appendage thrombus  Allergies  Allergen Reactions  . Penicillins Hives    Patient Measurements: Height: 6\' 2"  (188 cm) Weight: 204 lb 4.8 oz (92.67 kg) IBW/kg (Calculated) : 82.2  Vital Signs: Temp: 98.3 F (36.8 C) (03/19 0524) Temp Source: Oral (03/19 0524) BP: 150/97 mmHg (03/19 0648) Pulse Rate: 81 (03/19 0648)  Labs:  Recent Labs  01/31/16 2151 01/31/16 2157 02/01/16 2205 02/02/16 0733 02/03/16 0554  HGB 14.3 16.0 14.8 13.8  --   HCT 43.9 47.0 45.4 42.5  --   PLT 133*  --  127* 120*  --   APTT 29  --   --   --   --   LABPROT 15.2  --   --   --  17.0*  INR 1.18  --   --   --  1.37  CREATININE 1.42* 1.30*  --  1.14 1.18    Estimated Creatinine Clearance: 50.3 mL/min (by C-G formula based on Cr of 1.18).   Medical History: Past Medical History  Diagnosis Date  . Coronary artery disease     MI, atherectomy 1993  . Hyperlipidemia   . Arrhythmia     1995  . Atrial fibrillation ([place])   . History of transient ischemic attack (TIA) 2005    double vision  . Hypertension   . History of gallstones   . Myocardial infarct ([place]) 12/98    inferior  . Eczema   . Elevated LFTs   . Skin cancer   . DDD (degenerative disc disease)   . Pneumonia     long time ago  . Varicose veins     Assessment: 82 y/o M on warfarin PTA for afib/stroke, he was a code stroke on 3/16 and received tPA, warfarin was re-started 3/18, he had a hematoma on his thigh that has resolved. CT Angio now reveals a thrombus in the left atrial appendage with embolic strokes. Pharmacy has been consulted to dose Eliquis for Nonvalvular afib with the plan to d/c heparain, coumadin and ASA after Eliquis starts.   No s/sx bleeding currently per RN.   INR currently subtherapeutic at 1.37 Hgb 13.8, Pls 120.  Age 41, Scr 1.18, Weight 93 kg.   Goal of Therapy:  Heparin level  0.3-0.5 units/ml Monitor platelets by anticoagulation protocol: Yes   Plan:  -D/C Heparin gtt -D/C Warfarin -D/C Aspirin  -Apixaban 5 mg PO BID (to start 1 hr after d/c heparin gtt) -Monitor for s/sx bleeding, renal fxn changes.  Bennye Alm, PharmD Pharmacy Resident [phone]

## 2016-02-03 NOTE — Consult Note (Addendum)
CTA chest shows a visible intracardiac thrombus (left atrium) per radiology and therefore i have ordered a heparin drip without a bolus with specific instructions to aim for low end of therapeutic range.  I have stopped asa while pt on heparin.  If desired, please reorder.

## 2016-02-04 ENCOUNTER — Ambulatory Visit: Payer: Medicare Other | Admitting: Vascular Surgery

## 2016-02-04 ENCOUNTER — Encounter (HOSPITAL_COMMUNITY): Payer: Medicare Other

## 2016-02-04 DIAGNOSIS — I63412 Cerebral infarction due to embolism of left middle cerebral artery: Principal | ICD-10-CM

## 2016-02-04 LAB — CBC
HEMATOCRIT: 39.5 % (ref 39.0–52.0)
Hemoglobin: 13.3 g/dL (ref 13.0–17.0)
MCH: 33 pg (ref 26.0–34.0)
MCHC: 33.7 g/dL (ref 30.0–36.0)
MCV: 98 fL (ref 78.0–100.0)
Platelets: 121 10*3/uL — ABNORMAL LOW (ref 150–400)
RBC: 4.03 MIL/uL — AB (ref 4.22–5.81)
RDW: 14.9 % (ref 11.5–15.5)
WBC: 10.4 10*3/uL (ref 4.0–10.5)

## 2016-02-04 LAB — PROTIME-INR
INR: 1.65 — AB (ref 0.00–1.49)
Prothrombin Time: 19.5 seconds — ABNORMAL HIGH (ref 11.6–15.2)

## 2016-02-04 LAB — BASIC METABOLIC PANEL
ANION GAP: 6 (ref 5–15)
BUN: 22 mg/dL — ABNORMAL HIGH (ref 6–20)
CALCIUM: 8.3 mg/dL — AB (ref 8.9–10.3)
CHLORIDE: 104 mmol/L (ref 101–111)
CO2: 24 mmol/L (ref 22–32)
Creatinine, Ser: 1.15 mg/dL (ref 0.61–1.24)
GFR calc non Af Amer: 55 mL/min — ABNORMAL LOW (ref >60)
GLUCOSE: 106 mg/dL — AB (ref 65–99)
Potassium: 3.9 mmol/L (ref 3.5–5.1)
Sodium: 134 mmol/L — ABNORMAL LOW (ref 135–145)

## 2016-02-04 LAB — PATHOLOGIST SMEAR REVIEW

## 2016-02-04 MED ORDER — EZETIMIBE 10 MG PO TABS: 10.0000 mg | ORAL_TABLET | Freq: Every day | ORAL | Status: DC

## 2016-02-04 MED ORDER — EZETIMIBE 10 MG PO TABS
10.0000 mg | ORAL_TABLET | Freq: Every day | ORAL | Status: DC
  Administered 2016-02-04: 10 mg via ORAL
  Filled 2016-02-04: qty 1

## 2016-02-04 NOTE — Care Management Important Message (Signed)
Important Message  Patient Details  Name: Arthur [name] MRN: [id number] Date of Birth: 1925/06/24   Medicare Important Message Given:  Yes    Habiba Treloar P Aryianna Earwood 02/04/2016, 4:31 PM

## 2016-02-04 NOTE — Progress Notes (Signed)
Pharmacist Provided - Patient Medication Education Prior to Discharge   Arthur [name] is an 82 y.o. male who presented to [facility] on 01/31/2016 with a chief complaint of  Chief Complaint  Patient presents with  . Code Stroke     [x]  Patient will be discharged with 3 new medications []  Patient being discharged without any new medications  The following medications were discussed with the patient: amlodipine, atorvastatin, Eliquis, stopping warfarin and losartan  Pain Control medications: []  Yes    [x]  No  Diabetes Medications: []  Yes    [x]  No  Heart Failure Medications: []  Yes    [x]  No  Anticoagulation Medications:  [x]  Yes    []  No  Antibiotics at discharge: []  Yes    [x]  No  Allergy Assessment Completed and Updated: [x]  Yes    []  No Identified Patient Allergies:  Allergies  Allergen Reactions  . Penicillins Hives     Medication Adherence Assessment: []  Excellent (no doses missed/week)      [x]  Good (1 dose missed/week)      []  Partial (2-3 doses missed/week)      []  Poor (>3 doses missed/week)  Barriers to Obtaining Medications: []  Yes [x]  No  Assessment: I spoke with Arthur [name] about his new medications, and made sure he was clear about stopping the warfarin since he was starting Eliquis. I briefly reviewed Eliquis with him as he has already been counseled by the pharmacist during the day. He was concerned about going back on atorvastatin because his doctor had previously stopped this because his liver enzymes were high. I recommended that he keep his follow-up appointments with his PCP and tell them about this new medication so they could appropriately monitor it. He states he gets his liver enzymes checked every month. I also made sure he knew to stop the losartan now that he is starting amlodipine. He stated that his physician stopped that several months ago, so I will remove it from his medication list. I answered his questions and told him we are available if he has  more.  Time spent preparing for discharge counseling: 10 min Time spent counseling patient: 25 min  Governor Specking, PharmD Clinical Pharmacy Resident Pager: [phone] 02/04/2016, 3:53 PM

## 2016-02-04 NOTE — Progress Notes (Signed)
NURSING PROGRESS NOTE  KEMPTON TRAMELL [id number] Discharge Data: 02/04/2016 6:37 PM Attending Provider: Rosalin Hawking, MD [id number] Linna Darner, MD     [name] to be D/C'd Home with HOME health per MD order.  Discussed with the patient and family the After Visit Summary and all questions fully answered. All IV's discontinued with no bleeding noted. All belongings returned to patient for patient to take home. Pt taken downstairs via wheelchair accompanied by staff member.   Last Vital Signs:  Blood pressure 111/64, pulse 58, temperature 97.6 F (36.4 C), temperature source Oral, resp. rate 18, height 6\' 2"  (1.88 m), weight 92.67 kg (204 lb 4.8 oz), SpO2 97 %.  Discharge Medication List   Medication List    STOP taking these medications        losartan 50 MG tablet  Commonly known as:  COZAAR     warfarin 5 MG tablet  Commonly known as:  COUMADIN      TAKE these medications        albuterol 108 (90 Base) MCG/ACT inhaler  Commonly known as:  PROVENTIL HFA;VENTOLIN HFA  Inhale 2 puffs into the lungs every 6 (six) hours as needed for wheezing or shortness of breath.     amLODipine 5 MG tablet  Commonly known as:  NORVASC  Take 1 tablet (5 mg total) by mouth daily.     apixaban 5 MG Tabs tablet  Commonly known as:  ELIQUIS  Take 1 tablet (5 mg total) by mouth 2 (two) times daily.     ezetimibe 10 MG tablet  Commonly known as:  ZETIA  Take 1 tablet (10 mg total) by mouth daily.     HYDROcodone-acetaminophen 5-325 MG tablet  Commonly known as:  NORCO/VICODIN  Take 1 tablet by mouth every 6 (six) hours as needed for moderate pain.     metoprolol tartrate 25 MG tablet  Commonly known as:  LOPRESSOR  TAKE 1 TABLET DAILY     predniSONE 10 MG tablet  Commonly known as:  DELTASONE  Take 1 tablet (10 mg total) by mouth daily.

## 2016-02-04 NOTE — Progress Notes (Signed)
Pt and family requesting to speak with provider. Provider notified.

## 2016-02-04 NOTE — Progress Notes (Signed)
Physical Therapy Treatment Patient Details Name: Arthur [name] MRN: [id number] DOB: 05-May-1925 Today's Date: 02/04/2016    History of Present Illness 82 y.o. male with history of coronary artery disease, hyperlipidemia, atrial fibrillation, TIA, hypertension, and previous MI, presenting with dysarthria and right arm difficulties. Pt was admitted code stroke ([place] territory) and administered tPA. He had previously undergone a laser vein surgery RLE 01-28-16.    PT Comments    Pt is progressing well with his mobility and gait and was able to walk the unit and practice the stairs.  He has significant instability on the stairs, so I advised him to stay downstairs until the PT can get in and practice the steps with him at home.  PT to follow acutely until d/c confirmed.     Follow Up Recommendations  Home health PT;Supervision for mobility/OOB     Equipment Recommendations  Other (comment) (pt to check at home if he has a cane, doesn't need RW)    Recommendations for Other Services   NA     Precautions / Restrictions Precautions Precautions: Fall    Mobility  Bed Mobility Overal bed mobility: Needs Assistance Bed Mobility: Supine to Sit     Supine to sit: Modified independent (Device/Increase time)     General bed mobility comments: Pt using bed rail to pull to sitting EOB.  HOB ~30 degrees  Transfers Overall transfer level: Needs assistance Equipment used: Rolling walker (2 wheeled) Transfers: Sit to/from Stand Sit to Stand: Modified independent (Device/Increase time)         General transfer comment: Pt with heavy reliance on hands for transitions, multiple attempts needed with cues to scoot closer to EOB.  Verbal cues for safe hand placement and slow descent to sit.   Ambulation/Gait Ambulation/Gait assistance: Min guard Ambulation Distance (Feet): 350 Feet Assistive device: Rolling walker (2 wheeled);None Gait Pattern/deviations: Step-through pattern;Staggering  left;Staggering right Gait velocity: decreased   General Gait Details: pt with mildly staggering gait pattern, increased DOE with gait (per pt report this is normal), O2 sats and HR were stable despite dyspnea.    Stairs Stairs: Yes Stairs assistance: Min assist Stair Management: One rail Right;Alternating pattern;Forwards;Step to pattern Number of Stairs: 10 General stair comments: Alternating pattern, up, step to down. heavy reliance on hands on railing, much more dificulty and instability coming down the stairs.  I advised the pt to stay downstairs until he gets a bit stronger and can go up and down the stairs unassisted again.          Balance Overall balance assessment: Needs assistance Sitting-balance support: Feet supported;No upper extremity supported Sitting balance-Leahy Scale: Good     Standing balance support: Single extremity supported;Bilateral upper extremity supported;No upper extremity supported Standing balance-Leahy Scale: Fair                      Cognition Arousal/Alertness: Awake/alert Behavior During Therapy: WFL for tasks assessed/performed Overall Cognitive Status: Within Functional Limits for tasks assessed                             Pertinent Vitals/Pain Pain Assessment: No/denies pain           PT Goals (current goals can now be found in the care plan section) Acute Rehab PT Goals Patient Stated Goal: to go home today Progress towards PT goals: Progressing toward goals    Frequency  Min 4X/week  PT Plan Frequency needs to be updated       End of Session Equipment Utilized During Treatment: Gait belt Activity Tolerance: Patient limited by fatigue Patient left: in bed;with call bell/phone within reach;with family/visitor present (seated EOB)     Time: [id number] PT Time Calculation (min) (ACUTE ONLY): 30 min  Charges:  $Gait Training: 23-37 mins                      Nashay Brickley B. Bern, [place], DPT  [phone]   02/04/2016, 3:07 PM

## 2016-02-04 NOTE — Care Management Note (Addendum)
Case Management Note  Patient Details  Name: Arthur [name] MRN: [id number] Date of Birth: 12/21/1924  Subjective/Objective:                 Spoke with patient and family in the room. Patient is familiar with [facility] and would like to use them for [name] PT. Patient unclear if he would like to use Eliquis after discharge or continue with coumadin. He is uncomfortable with not getting INR checked and is not confident that med would be working if he could not get labs drawn to verify it was. CM referred to pharmacist for further clarification after explaining to patient that Eliquis did not need lab draws, and patient continuing to ask questions. Pharmacist stated hat he would follow up.  Addendum: Per pharmacist, patient ok w/ Eliquis at discharge. This CM has provided patient w/ Eliquis 30 day card   Action/Plan:   Referral made to [facility] for [facility] PT. Addendum- paged MD for [facility] orders  Expected Discharge Date:                  Expected Discharge Plan:  [place]  In-House Referral:     Discharge planning Services  CM Consult  Post Acute Care Choice:  Home Health Choice offered to:  Patient  DME Arranged:    DME Agency:     HH Arranged:  PT [place]:  [place]  Status of Service:  Completed, signed off  Medicare Important Message Given:    Date Medicare IM Given:    Medicare IM give by:    Date Additional Medicare IM Given:    Additional Medicare Important Message give by:     If discussed at [place] of Stay Meetings, dates discussed:    Additional Comments:  Carles Collet, RN 02/04/2016, 12:58 PM

## 2016-02-05 ENCOUNTER — Telehealth: Payer: Self-pay | Admitting: Cardiovascular Disease

## 2016-02-05 ENCOUNTER — Telehealth: Payer: Self-pay | Admitting: Internal Medicine

## 2016-02-05 NOTE — Telephone Encounter (Signed)
I spoke with the pt's wife and made her aware that I will contact them with an appointment after speaking with Dr Burt Knack.

## 2016-02-05 NOTE — Telephone Encounter (Signed)
New message  Pt called states that he recently had a stroke. Req an appt with Dr. Burt Knack within two weeks to adjust medications. Please call back to discuss

## 2016-02-05 NOTE — Telephone Encounter (Signed)
Pt had stroke recently and was in the hospital. Pt wanted to let Dr. Henrene Pastor know that he has now been started on Losartan. Pt stated he was to follow-up with Dr. Henrene Pastor in 2 weeks. Per discharge instructions pt was to followup with PCP in 2 weeks. Pt aware. Dr. Henrene Pastor notified.

## 2016-02-06 ENCOUNTER — Ambulatory Visit (HOSPITAL_COMMUNITY)
Admission: RE | Admit: 2016-02-06 | Discharge: 2016-02-06 | Disposition: A | Discharge status: Home / Self Care | Payer: Medicare Other | Source: Ambulatory Visit | Attending: Vascular Surgery | Admitting: Vascular Surgery

## 2016-02-06 ENCOUNTER — Telehealth: Payer: Self-pay | Admitting: *Deleted

## 2016-02-06 DIAGNOSIS — I252 Old myocardial infarction: Secondary | ICD-10-CM | POA: Insufficient documentation

## 2016-02-06 DIAGNOSIS — I10 Essential (primary) hypertension: Secondary | ICD-10-CM | POA: Diagnosis not present

## 2016-02-06 DIAGNOSIS — E785 Hyperlipidemia, unspecified: Secondary | ICD-10-CM | POA: Diagnosis not present

## 2016-02-06 DIAGNOSIS — Z9889 Other specified postprocedural states: Secondary | ICD-10-CM | POA: Diagnosis present

## 2016-02-06 DIAGNOSIS — L97319 Non-pressure chronic ulcer of right ankle with unspecified severity: Secondary | ICD-10-CM

## 2016-02-06 DIAGNOSIS — I83013 Varicose veins of right lower extremity with ulcer of ankle: Secondary | ICD-10-CM

## 2016-02-06 NOTE — Telephone Encounter (Signed)
Transition Care Management Follow-up Telephone Call   Date discharged? 02/04/16   How have you been since you were released from the hospital? Pt states he is feeling alright   Do you understand why you were in the hospital? YES   Do you understand the discharge instructions? YES   Where were you discharged to? Home   Items Reviewed:  Medications reviewed: YES  Allergies reviewed: YES  Dietary changes reviewed: NO  Referrals reviewed: NO   Functional Questionnaire:   Activities of Daily Living (ADLs):   He states he are independent in the following: ambulation, bathing and hygiene, feeding, continence, grooming, toileting and dressing States he doesn't require assistance    Any transportation issues/concerns?: NO   Any patient concerns? NO   Confirmed importance and date/time of follow-up visits scheduled YES, appt 02/19/16  Provider Appointment booked with Dr. Jenny Reichmann  Confirmed with patient if condition begins to worsen call PCP or go to the ER.  Patient was given the office number and encouraged to call back with question or concerns.  : YES

## 2016-02-07 ENCOUNTER — Telehealth: Payer: Self-pay | Admitting: Internal Medicine

## 2016-02-07 NOTE — Telephone Encounter (Signed)
Discussed with pt that he does not need lab work again for 30mths. Pt aware, order and reminder in epic.

## 2016-02-07 NOTE — Telephone Encounter (Signed)
Left message to call back  

## 2016-02-07 NOTE — Telephone Encounter (Signed)
New Message  Pt called has had a stroke over the weekend. Was advised to sch with Dr. Burt Knack to check labs. Called to check on being worked in

## 2016-02-08 ENCOUNTER — Telehealth: Payer: Self-pay | Admitting: *Deleted

## 2016-02-08 ENCOUNTER — Encounter: Payer: Self-pay | Admitting: Family

## 2016-02-08 NOTE — Telephone Encounter (Signed)
I spoke with the pt and he is anxious about his recent stroke and his medication being changed from warfarin to Eliquis.  The pt was advised upon discharge to follow-up with Cardiology.  At this time I have not had any cancellations in Dr Antionette Char schedule.  I have arranged appointment with Truitt Merle NP on 02/15/16 at 10:00 and Dr Burt Knack is DOD in the office.

## 2016-02-08 NOTE — Telephone Encounter (Signed)
F/U call.. Had a stroke over the weekend and they change his Warfrain to Eliquis .   Thanks

## 2016-02-08 NOTE — Telephone Encounter (Signed)
-----   Message from Barkley Boards, RN sent at 02/08/2016  2:55 PM EDT ----- Regarding: INR appt Pt has appt next week but he was just switched from warfarin to Eliquis due to stroke.   Arthur [name]

## 2016-02-08 NOTE — Telephone Encounter (Signed)
Spoke with pt and changed his appt for coumadin clinic to Friday March 31st as he has an appt with [name] . Pt is now on Eliquis 5mg  bid every 12 hours and we will talk about how he is tolerating  Eliquis at this appt  time

## 2016-02-10 NOTE — Telephone Encounter (Signed)
[place] with new med. Continue to monitor liver as we are doing. Thanks

## 2016-02-11 ENCOUNTER — Encounter: Payer: Self-pay | Admitting: Vascular Surgery

## 2016-02-11 ENCOUNTER — Ambulatory Visit (INDEPENDENT_AMBULATORY_CARE_PROVIDER_SITE_OTHER): Payer: Medicare Other | Admitting: Vascular Surgery

## 2016-02-11 VITALS — BP 131/70 | HR 73 | Temp 98.0°F | Resp 16 | Ht 74.0 in | Wt 200.0 lb

## 2016-02-11 DIAGNOSIS — I83891 Varicose veins of right lower extremities with other complications: Secondary | ICD-10-CM

## 2016-02-11 NOTE — Progress Notes (Signed)
Subjective:     Patient ID: Arthur [name], male   DOB: 11/01/1925, 82 y.o.   MRN: [id number]  HPI This 82 year old male returns for follow-up regarding his laser ablation right great saphenous vein from the midcalf to a large incompetent perforating branch in the mid thigh for gross reflux with history of stasis ulcer right ankle since December 2016. He was on Coumadin preoperatively. This was resumed postoperatively. Patient did suffer a  CVA  requiring hospitalization in the post procedure.Marland Kitchen He was discharged with neurologic exam intact. He returns today with no specific complications relative to his right leg procedure. He continues to have dressing changes by his wife every other day and wear elastic compression stockings. He now takes Eliquis and is followed by Dr. Gerrit Halls. He developed a phase and right-sided weakness which did not resolve for 48 hours following his admission to the hospital for the left brain CVA. His symptoms completely resolved while in the hospital and he is now neurologically intact.   Past Medical History  Diagnosis Date  . Coronary artery disease     MI, atherectomy 1993  . Hyperlipidemia   . Arrhythmia     1995  . Atrial fibrillation ([place])   . History of transient ischemic attack (TIA) 2005    double vision  . Hypertension   . History of gallstones   . Myocardial infarct ([place]) 12/98    inferior  . Eczema   . Elevated LFTs   . Skin cancer   . DDD (degenerative disc disease)   . Pneumonia     long time ago  . Varicose veins   . Stroke [facility])     Social History  Substance Use Topics  . Smoking status: Former Smoker -- 7 years    Types: 2, Pipe    Quit date: 11/18/1967  . Smokeless tobacco: Never Used     Comment: 1-2 cigars a day, pipe  . Alcohol Use: 0.0 oz/week    0 Standard drinks or equivalent per week     Comment: 2-3 glasses of wine in the evening    Family History  Problem Relation Age of Onset  . Heart attack Mother 4    MI  .  Pneumonia Father     Allergies  Allergen Reactions  . Penicillins Hives     Current outpatient prescriptions:  .  albuterol (PROVENTIL HFA;VENTOLIN HFA) 108 (90 BASE) MCG/ACT inhaler, Inhale 2 puffs into the lungs every 6 (six) hours as needed for wheezing or shortness of breath., Disp: 1 Inhaler, Rfl: 2 .  amLODipine (NORVASC) 5 MG tablet, Take 1 tablet (5 mg total) by mouth daily., Disp: 30 tablet, Rfl: 2 .  apixaban (ELIQUIS) 5 MG TABS tablet, Take 1 tablet (5 mg total) by mouth 2 (two) times daily., Disp: 60 tablet, Rfl: 2 .  ezetimibe (ZETIA) 10 MG tablet, Take 1 tablet (10 mg total) by mouth daily., Disp: 30 tablet, Rfl: 2 .  HYDROcodone-acetaminophen (NORCO/VICODIN) 5-325 MG tablet, Take 1 tablet by mouth every 6 (six) hours as needed for moderate pain., Disp: , Rfl:  .  metoprolol tartrate (LOPRESSOR) 25 MG tablet, TAKE 1 TABLET DAILY, Disp: 90 tablet, Rfl: 1 .  predniSONE (DELTASONE) 10 MG tablet, Take 1 tablet (10 mg total) by mouth daily., Disp: 100 tablet, Rfl: 3  Filed Vitals:   02/11/16 1047  BP: 131/70  Pulse: 73  Temp: 98 F (36.7 C)  Resp: 16  Height: 6\' 2"  (1.88 m)  Weight: 200  lb (90.719 kg)  SpO2: 98%    Body mass index is 25.67 kg/(m^2).          Review of Systems Denies chest pain, dyspnea on exertion, PND, orthopnea, hemoptysis     Objective:   Physical Exam BP 131/70 mmHg  Pulse 73  Temp(Src) 98 F (36.7 C)  Resp 16  Ht 6\' 2"  (1.88 m)  Wt 200 lb (90.719 kg)  BMI 25.67 kg/m2  SpO2 98%   Gen. Elderly male no apparent distress alert and oriented 3 Lungs no rhonchi or wheezing Neurologic exam is normal with clear speech in good strength on both sides Right lower extremity with stocking intact. Stocking was removed and patient has near complete healing of pretibial ulceration lower third right leg  3+ dorsalis pedis pulse palpable  mild tenderness to deep palpation of great saphenous vein from proximal calf to mid thigh    today I  ordered a venous duplex exam the  Right leg which I reviewed and interpreted. The great saphenous vein is closed from the midcalf to near the saphenofemoral junction.     Assessment:      successful laser ablation right great saphenous vein in patient with chronic venous stasis ulcer which is healing rapidly Patient suffered left brain CVA which competely resolved after or date hours and hospital and receiving TPA   patient currently on Eliquis     Plan:      patient to return to see me on when necessary basis If ulceration and lower leg worsens he should return to wound center as he was doing previously

## 2016-02-11 NOTE — Telephone Encounter (Signed)
Spoke with pt and he is aware. 

## 2016-02-15 ENCOUNTER — Encounter: Payer: Self-pay | Admitting: Nurse Practitioner

## 2016-02-15 ENCOUNTER — Ambulatory Visit (INDEPENDENT_AMBULATORY_CARE_PROVIDER_SITE_OTHER): Payer: Medicare Other | Admitting: *Deleted

## 2016-02-15 ENCOUNTER — Ambulatory Visit (INDEPENDENT_AMBULATORY_CARE_PROVIDER_SITE_OTHER): Payer: Medicare Other | Admitting: Nurse Practitioner

## 2016-02-15 VITALS — BP 128/66 | HR 52 | Ht 74.0 in | Wt 201.8 lb

## 2016-02-15 DIAGNOSIS — I10 Essential (primary) hypertension: Secondary | ICD-10-CM | POA: Diagnosis not present

## 2016-02-15 DIAGNOSIS — Z7901 Long term (current) use of anticoagulants: Secondary | ICD-10-CM

## 2016-02-15 DIAGNOSIS — I83893 Varicose veins of bilateral lower extremities with other complications: Secondary | ICD-10-CM

## 2016-02-15 DIAGNOSIS — I259 Chronic ischemic heart disease, unspecified: Secondary | ICD-10-CM

## 2016-02-15 DIAGNOSIS — I4891 Unspecified atrial fibrillation: Secondary | ICD-10-CM

## 2016-02-15 DIAGNOSIS — I482 Chronic atrial fibrillation, unspecified: Secondary | ICD-10-CM

## 2016-02-15 NOTE — Progress Notes (Signed)
CARDIOLOGY OFFICE NOTE  Date:  02/15/2016    Arthur [name] Date of Birth: 08/05/1925 Medical Record [id number]  PCP:  [name], MD  Cardiologist:  Burt Knack    Chief Complaint  Patient presents with  . Post hospital visit for stroke.    Seen for Dr. Burt Knack    History of Present Illness: Arthur [name] is a 82 y.o. male who presents today for a post hospital visit. Seen for Dr. Burt Knack.   He has a history of chronic atrial fib, HLD, HTN, prior TIA, coronary artery disease with history of anteroseptal and inferior wall MIs in the 1990s, and previous stenting of the LAD in 2000. He remains on chronic coumadin therapy. He has had issues with varicose veins - followed by Dr. Kellie Simmering. Chronically elevated LFTs - not on statin therapy. Has had melanoma - sees Dr. Alen Blew.   Last seen in November by Dr. Burt Knack.   Has had recent vein procedure - had coumadin stopped for 5 days. No bridging noted. Had had 2 doses post op and then suffered stroke earlier this month at home - patchy small volume acute ischemic nonhemorrhagic left MCA territory infarcts - presented with dysarthria and trouble using the right hand. He was treated with TPA. Now on Eliquis.   Comes back today. Here with Arthur [name] his wife. He is doing well. Has no deficits from his stroke. Speech is ok. Right hand is ok. No chest pain. Not short of breath. Wants to go back to driving. His right leg is fine. He apparently does not need further intervention. He is tolerating his Eliquis - still not quite clear about not having to check an INR. No bleeding. He is basically back to most of all his regular activities and is getting some therapy for his leg.   Past Medical History  Diagnosis Date  . Coronary artery disease     MI, atherectomy 1993  . Hyperlipidemia   . Arrhythmia     1995  . Atrial fibrillation ([place])   . History of transient ischemic attack (TIA) 2005    double vision  . Hypertension   . History of gallstones   .  Myocardial infarct ([place]) 12/98    inferior  . Eczema   . Elevated LFTs   . Skin cancer   . DDD (degenerative disc disease)   . Pneumonia     long time ago  . Varicose veins   . Stroke [facility])     Past Surgical History  Procedure Laterality Date  . Cardiac catheterization  04/08/99    LAD 70% INTER 40-50% RCA 30-40%  . Appendectomy    . Coronary angioplasty  04/08/99    LAD  . Coronary angioplasty with stent placement  04/08/99    LAD  . Coronary angioplasty  10/17/97    RCA   . Melanoma excision  2013    lt arm  . Tonsillectomy    . Amputation Right 05/10/2013    Procedure: RIGHT SECOND TOE AMPUTATION;  Surgeon: Hessie Dibble, MD;  Location: [place];  Service: Orthopedics;  Laterality: Right;  . Melanoma excision Left 08/23/2013    Procedure: MELANOMA EXCISION;  Surgeon: Pedro Earls, MD;  Location[facility];  Service: General;  Laterality: Left;  . Video bronchoscopy Bilateral 05/07/2015    Procedure: VIDEO BRONCHOSCOPY WITHOUT FLUORO;  Surgeon: Rigoberto Noel, MD;  Location: [place];  Service: Cardiopulmonary;  Laterality: Bilateral;     Medications:  Current Outpatient Prescriptions  Medication Sig Dispense Refill  . albuterol (PROVENTIL HFA;VENTOLIN HFA) 108 (90 BASE) MCG/ACT inhaler Inhale 2 puffs into the lungs every 6 (six) hours as needed for wheezing or shortness of breath. 1 Inhaler 2  . apixaban (ELIQUIS) 5 MG TABS tablet Take 1 tablet (5 mg total) by mouth 2 (two) times daily. 60 tablet 2  . ezetimibe (ZETIA) 10 MG tablet Take 1 tablet (10 mg total) by mouth daily. 30 tablet 2  . metoprolol tartrate (LOPRESSOR) 25 MG tablet TAKE 1 TABLET DAILY 90 tablet 1  . predniSONE (DELTASONE) 10 MG tablet Take 1 tablet (10 mg total) by mouth daily. 100 tablet 3   No current facility-administered medications for this visit.    Allergies: Allergies  Allergen Reactions  . Penicillins Hives    Social History: The patient  reports that he quit  smoking about 48 years ago. His smoking use included Cigars and Pipe. He has never used smokeless tobacco. He reports that he drinks alcohol. He reports that he does not use illicit drugs.   Family History: The patient's family history includes Heart attack (age of onset: 41) in his mother; Pneumonia in his father.   Review of Systems: Please see the history of present illness.   Otherwise, the review of systems is positive for none.   All other systems are reviewed and negative.   Physical Exam: VS:  BP 128/66 mmHg  Pulse 52  Ht 6\' 2"  (1.88 m)  Wt 201 lb 12.8 oz (91.536 kg)  BMI 25.90 kg/m2 .  BMI Body mass index is 25.9 kg/(m^2).  Wt Readings from Last 3 Encounters:  02/15/16 201 lb 12.8 oz (91.536 kg)  02/11/16 200 lb (90.719 kg)  01/31/16 204 lb 4.8 oz (92.67 kg)    General: Pleasant. Elderly male who looks younger than his stated age. Well developed, well nourished and in no acute distress.  HEENT: Normal. Neck: Supple, no JVD, carotid bruits, or masses noted.  Cardiac: Irregular irregular rhythm. Rate is ok. Outflow murmur noted. No edema. He has bilateral support stockings in place.  Respiratory:  Lungs are clear to auscultation bilaterally with normal work of breathing.  GI: Soft and nontender.  MS: No deformity or atrophy. Gait and ROM intact. Skin: Warm and dry. Color is normal.  Neuro:  Strength and sensation are intact and no gross focal deficits noted.  Psych: Alert, appropriate and with normal affect.   LABORATORY DATA:  EKG:  EKG is not ordered today.  Lab Results  Component Value Date   WBC 10.4 02/04/2016   HGB 13.3 02/04/2016   HCT 39.5 02/04/2016   PLT 121* 02/04/2016   GLUCOSE 106* 02/04/2016   CHOL 161 02/01/2016   TRIG 58 02/01/2016   HDL 46 02/01/2016   LDLCALC 103* 02/01/2016   ALT 118* 01/31/2016   AST 91* 01/31/2016   NA 134* 02/04/2016   K 3.9 02/04/2016   CL 104 02/04/2016   CREATININE 1.15 02/04/2016   BUN 22* 02/04/2016   CO2 24  02/04/2016   TSH 1.64 10/09/2009   PSA 5.69* 10/09/2009   INR 1.65* 02/04/2016   HGBA1C 5.9* 02/01/2016    BNP (last 3 results)  Recent Labs  10/28/15 1105  BNP 700.1*    ProBNP (last 3 results) No results for input(s): PROBNP in the last 8760 hours.   Other Studies Reviewed Today:  Ct Head Wo Contrast 01/31/2016  1. No acute findings. No intracranial hemorrhage or edema.  2.  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  3. Meningioma overlying the left occipital lobe, stable in size, now partially calcified.    EEG - This awake and drowsy EEG is within normal limits for age. A normal EEG does not exclude a clinical diagnosis of epilepsy. Clinical correlation is advised.   2D echo - Left ventricle: The cavity size was normal. Wall thickness was  increased in a pattern of mild LVH. There was mild focal basal  hypertrophy of the septum. Systolic function was normal. The  estimated ejection fraction was in the range of 55% to 60%. Wall  motion was normal; there were no regional wall motion  abnormalities. - Aortic valve: Valve mobility was restricted. There was mild  stenosis. There was mild regurgitation. - Aortic root: The aortic root was mildly dilated. - Ascending aorta: The ascending aorta was mildly dilated. - Mitral valve: Calcified annulus. There was mild regurgitation. - Left atrium: The atrium was severely dilated. - Right atrium: The atrium was moderately dilated. - Pulmonary arteries: Systolic pressure was mildly increased. PA  peak pressure: 36 mm Hg (S). Impressions: - Normal LV systolic function; biatrial enlargement; calcified  aortic valve with mild AS (mean gradient 17 mmHg); mild AI; mild  MR; trace TR with mildly elevated pulmonary pressure; mildly  dilated aortic root, ascending aorta and aortic arch; suggest CTA   or MRA to further assess.   CT Angio Chest Aorta with and without CM 02/02/2016 1.  Biatrial enlargement with a small thrombus in the left atrial appendage. 2. No increase in size of the previously seen ascending thoracic aortic aneurysm, currently measuring 4.5 cm in maximum diameter. Recommend semi-annual imaging followup by CTA or MRA and referral to cardiothoracic surgery if not already obtained. This recommendation follows 2010 ACCF/AHA/AATS/ACR/ASA/SCA/SCAI/SIR/STS/SVM Guidelines for the Diagnosis and Management of Patients With Thoracic Aortic Disease. Circulation. 2010; 121: [id number] 3. Small to moderate-sized right pleural effusion and small left pleural effusion. 4. Bilateral dependent atelectasis, greater on the right. 5. Dense atheromatous coronary artery calcifications.    MRI Head 02/02/2016 1. Patchy small volume acute ischemic nonhemorrhagic left MCA territory infarcts as above. 2. Remote bilateral cerebellar infarcts, right larger than left. 3. Moderate generalized age-related cerebral atrophy. 4. 2.1 cm left parietal convexity meningioma. No associated edema.  MRA Head  02/02/2016 1. No definite large or proximal arterial branch occlusion within the intracranial circulation. 2. Question proximal right M2 branch occlusion, not entirely certain given the absence of acute or chronic infarct within this territory. Finding may be related to motion artifact on this exam. 3. Probable proximal nonocclusive moderate M2 stenoses bilaterally. 4. Diminutive vertebrobasilar system with fetal origin of the right PCA. Left PCA also largely supplied via a prominent left posterior communicating artery. 5. Nonvisualization of the right PICA, which may be occluded given the remote right cerebellar infarct.   Assessment/Plan: 1. Recent stroke - early presentation - received TPA - now with no residual deficits. His scans did show prior old cerebellar infarcts. Would favor keeping on Eliquis - he is agreeable. He has labs for next month as well as a consult in the anticoag  clinic here.   2. Chronic atrial fib - managed with rate control and anticoagulation.   3. Chronic anticoagulation - now on Eliquis - no problems noted.   4. Dilated aorta/aortic root - discussed with Dr. Burt Knack - would plan for thoracic MRA in one year.   Current medicines are reviewed with the patient today.  The  patient does not have concerns regarding medicines other than what has been noted above.  The following changes have been made:  See above.  Labs/ tests ordered today include:   No orders of the defined types were placed in this encounter.     Disposition:   FU with Dr. Burt Knack in 6 months.    Patient is agreeable to this plan and will call if any problems develop in the interim.   Signed: [name], RN, ANP-C 02/15/2016 10:35 AM  [facility] [address] [place], [place]  [address] Phone: [phone] Fax: [phone]

## 2016-02-15 NOTE — Patient Instructions (Addendum)
We will be checking the following labs today - NONE   Medication Instructions:    Continue with your current medicines.     Testing/Procedures To Be Arranged:  N/A  Follow-Up:   See Dr. Burt Knack in 6 months    Other Special Instructions:   Ok to drive.     If you need a refill on your cardiac medications before your next appointment, please call your pharmacy.   Call the [place] office at [phone] if you have any questions, problems or concerns.

## 2016-02-19 ENCOUNTER — Encounter: Payer: Self-pay | Admitting: Internal Medicine

## 2016-02-19 ENCOUNTER — Ambulatory Visit (INDEPENDENT_AMBULATORY_CARE_PROVIDER_SITE_OTHER): Payer: Medicare Other | Admitting: Internal Medicine

## 2016-02-19 VITALS — BP 126/80 | HR 56 | Temp 98.6°F | Resp 20 | Wt 202.0 lb

## 2016-02-19 DIAGNOSIS — Z0189 Encounter for other specified special examinations: Secondary | ICD-10-CM

## 2016-02-19 DIAGNOSIS — E785 Hyperlipidemia, unspecified: Secondary | ICD-10-CM | POA: Diagnosis not present

## 2016-02-19 DIAGNOSIS — I63412 Cerebral infarction due to embolism of left middle cerebral artery: Secondary | ICD-10-CM | POA: Diagnosis not present

## 2016-02-19 DIAGNOSIS — Z0000 Encounter for general adult medical examination without abnormal findings: Secondary | ICD-10-CM

## 2016-02-19 DIAGNOSIS — N183 Chronic kidney disease, stage 3 unspecified: Secondary | ICD-10-CM

## 2016-02-19 DIAGNOSIS — I10 Essential (primary) hypertension: Secondary | ICD-10-CM | POA: Diagnosis not present

## 2016-02-19 NOTE — Patient Instructions (Signed)
Please continue all other medications as before, and refills have been done if requested.  Please have the pharmacy call with any other refills you may need.  Please continue your efforts at being more active, low cholesterol diet, and weight control.  You are otherwise up to date with prevention measures today.  Please keep your appointments with your specialists as you may have planned  Please return in 6 months, or sooner if needed, with Lab testing done 3-5 days before  

## 2016-02-19 NOTE — Progress Notes (Signed)
Subjective:    Patient ID: Arthur [name], male    DOB: Aug 17, 1925, 82 y.o.   MRN: [id number]  HPI  Pt here s/p hospn 3/16 -20 with Acute embolic CVA requiring TPA, 82 y.o. male with hx of afib who was off coumadin for a laser procedure on his right leg (compressible side). He restarted his coumadin the day prior to admission and had 2 doses. On the day of admission just after 7:30 he had trouble using his right hand and then acute onset of dysarthria noticed by him and his wife  Had mult studies done, including Dilated aortic root verified by CTA, rec'd for f/u at 6 mo or refer Thoracic Surgury.  Pt was changed from coumain to eliquis, Norvasc 5 mg and Lipitor 20 mg added to regimen.  Was seen and eval by PT, OT, ST.  Has been seen per vascular surgury for right leg and to f/u prn.  Seen per cardiology L Gerhart/ANP-C, favors remaining on eliquis, o/w clinicaly stable, rec'd f/u thoracic MRA in 1 yr, and Dr Cooper/card in 6 mo. BP overall Ok  Wt Readings from Last 3 Encounters:  02/19/16 202 lb (91.627 kg)  02/15/16 201 lb 12.8 oz (91.536 kg)  02/11/16 200 lb (90.719 kg)   BP Readings from Last 3 Encounters:  02/19/16 126/80  02/15/16 128/66  02/11/16 131/70  No overt bleeding. Dr Erlinda Hong Towanda Malkin on May 5.  Pt denies chest pain, increased sob or doe, wheezing, orthopnea, PND, increased LE swelling, palpitations, dizziness or syncope.  Has had PT at home.  Pt denies new neurological symptoms such as new headache, or facial or extremity weakness or numbness  Wife is convinced since he little residual that he had a "mini stroke" despite the fact he received TPA  Past Medical History  Diagnosis Date  . Coronary artery disease     MI, atherectomy 1993  . Hyperlipidemia   . Arrhythmia     1995  . Atrial fibrillation ([place])   . History of transient ischemic attack (TIA) 2005    double vision  . Hypertension   . History of gallstones   . Myocardial infarct ([place]) 12/98    inferior  . Eczema   .  Elevated LFTs   . Skin cancer   . DDD (degenerative disc disease)   . Pneumonia     long time ago  . Varicose veins   . Stroke [facility])    Past Surgical History  Procedure Laterality Date  . Cardiac catheterization  04/08/99    LAD 70% INTER 40-50% RCA 30-40%  . Appendectomy    . Coronary angioplasty  04/08/99    LAD  . Coronary angioplasty with stent placement  04/08/99    LAD  . Coronary angioplasty  10/17/97    RCA   . Melanoma excision  2013    lt arm  . Tonsillectomy    . Amputation Right 05/10/2013    Procedure: RIGHT SECOND TOE AMPUTATION;  Surgeon: Hessie Dibble, MD;  Location: [place];  Service: Orthopedics;  Laterality: Right;  . Melanoma excision Left 08/23/2013    Procedure: MELANOMA EXCISION;  Surgeon: Pedro Earls, MD;  [facility];  Service: General;  Laterality: Left;  . Video bronchoscopy Bilateral 05/07/2015    Procedure: VIDEO BRONCHOSCOPY WITHOUT FLUORO;  Surgeon: Rigoberto Noel, MD;  Location: [place];  Service: Cardiopulmonary;  Laterality: Bilateral;    reports that he quit smoking about 48 years ago. His  smoking use included Cigars and Pipe. He has never used smokeless tobacco. He reports that he drinks alcohol. He reports that he does not use illicit drugs. family history includes Heart attack (age of onset: 31) in his mother; Pneumonia in his father. Allergies  Allergen Reactions  . Penicillins Hives   Current Outpatient Prescriptions on File Prior to Visit  Medication Sig Dispense Refill  . albuterol (PROVENTIL HFA;VENTOLIN HFA) 108 (90 BASE) MCG/ACT inhaler Inhale 2 puffs into the lungs every 6 (six) hours as needed for wheezing or shortness of breath. 1 Inhaler 2  . apixaban (ELIQUIS) 5 MG TABS tablet Take 1 tablet (5 mg total) by mouth 2 (two) times daily. 60 tablet 2  . ezetimibe (ZETIA) 10 MG tablet Take 1 tablet (10 mg total) by mouth daily. 30 tablet 2  . metoprolol tartrate (LOPRESSOR) 25 MG tablet TAKE 1 TABLET  DAILY 90 tablet 1  . predniSONE (DELTASONE) 10 MG tablet Take 1 tablet (10 mg total) by mouth daily. 100 tablet 3   No current facility-administered medications on file prior to visit.     Review of Systems  Constitutional: Negative for unusual diaphoresis or night sweats HENT: Negative for ear swelling or discharge Eyes: Negative for worsening visual haziness  Respiratory: Negative for choking and stridor.   Gastrointestinal: Negative for distension or worsening eructation Genitourinary: Negative for retention or change in urine volume.  Musculoskeletal: Negative for other MSK pain or swelling Skin: Negative for color change and worsening wound Neurological: Negative for tremors and numbness other than noted  Psychiatric/Behavioral: Negative for decreased concentration or agitation other than above       Objective:   Physical Exam BP 126/80 mmHg  Pulse 56  Temp(Src) 98.6 F (37 C) (Oral)  Resp 20  Wt 202 lb (91.627 kg)  SpO2 98% VS noted,  Constitutional: Pt appears in no apparent distress HENT: Head: NCAT.  Right Ear: External ear normal.  Left Ear: External ear normal.  Eyes: . Pupils are equal, round, and reactive to light. Conjunctivae and EOM are normal Neck: Normal range of motion. Neck supple.  Cardiovascular: Normal rate and regular rhythm.   Pulmonary/Chest: Effort normal and breath sounds without rales or wheezing.  Abd:  Soft, NT, ND, + BS Neurological: Pt is alert. Not confused , motor grossly intact Skin: Skin is warm. No rash, no LE edema Psychiatric: Pt behavior is normal. No agitation.     Assessment & Plan:

## 2016-02-19 NOTE — Progress Notes (Signed)
Pre visit review using our clinic review tool, if applicable. No additional management support is needed unless otherwise documented below in the visit note. 

## 2016-02-22 NOTE — Assessment & Plan Note (Signed)
stable overall by history and exam, recent data reviewed with pt, and pt to continue medical treatment as before,  to f/u any worsening symptoms or concerns Lab Results  Component Value Date   WBC 10.4 02/04/2016   HGB 13.3 02/04/2016   HCT 39.5 02/04/2016   PLT 121* 02/04/2016   GLUCOSE 106* 02/04/2016   CHOL 161 02/01/2016   TRIG 58 02/01/2016   HDL 46 02/01/2016   LDLCALC 103* 02/01/2016   ALT 118* 01/31/2016   AST 91* 01/31/2016   NA 134* 02/04/2016   K 3.9 02/04/2016   CL 104 02/04/2016   CREATININE 1.15 02/04/2016   BUN 22* 02/04/2016   CO2 24 02/04/2016   TSH 1.64 10/09/2009   PSA 5.69* 10/09/2009   INR 1.65* 02/04/2016   HGBA1C 5.9* 02/01/2016   To cont PT

## 2016-02-22 NOTE — Assessment & Plan Note (Signed)
stable overall by history and exam, recent data reviewed with pt, and pt to continue medical treatment as before,  to f/u any worsening symptoms or concerns Lab Results  Component Value Date   CREATININE 1.15 02/04/2016

## 2016-02-22 NOTE — Assessment & Plan Note (Signed)
stable overall by history and exam, recent data reviewed with pt, and pt to continue medical treatment as before,  to f/u any worsening symptoms or concerns BP Readings from Last 3 Encounters:  02/19/16 126/80  02/15/16 128/66  02/11/16 131/70

## 2016-02-22 NOTE — Assessment & Plan Note (Signed)
stable overall by history and exam, recent data reviewed with pt, and pt to continue medical treatment as before,  to f/u any worsening symptoms or concerns Lab Results  Component Value Date   LDLCALC 103* 02/01/2016   Goal ldl < 70

## 2016-02-22 NOTE — Addendum Note (Signed)
Addended by: Biagio Borg on: 02/22/2016 05:07 AM   Modules accepted: Level of Service

## 2016-03-03 ENCOUNTER — Ambulatory Visit (INDEPENDENT_AMBULATORY_CARE_PROVIDER_SITE_OTHER): Payer: Medicare Other | Admitting: *Deleted

## 2016-03-03 DIAGNOSIS — I4891 Unspecified atrial fibrillation: Secondary | ICD-10-CM | POA: Diagnosis not present

## 2016-03-03 LAB — CBC
HCT: 43 % (ref 38.5–50.0)
HEMOGLOBIN: 14.1 g/dL (ref 13.2–17.1)
MCH: 32.2 pg (ref 27.0–33.0)
MCHC: 32.8 g/dL (ref 32.0–36.0)
MCV: 98.2 fL (ref 80.0–100.0)
MPV: 11.2 fL (ref 7.5–12.5)
Platelets: 139 10*3/uL — ABNORMAL LOW (ref 140–400)
RBC: 4.38 MIL/uL (ref 4.20–5.80)
RDW: 14.6 % (ref 11.0–15.0)
WBC: 7.4 10*3/uL (ref 3.8–10.8)

## 2016-03-03 LAB — BASIC METABOLIC PANEL
BUN: 24 mg/dL (ref 7–25)
CHLORIDE: 107 mmol/L (ref 98–110)
CO2: 25 mmol/L (ref 20–31)
Calcium: 8.9 mg/dL (ref 8.6–10.3)
Creat: 1.36 mg/dL — ABNORMAL HIGH (ref 0.70–1.11)
GLUCOSE: 93 mg/dL (ref 65–99)
POTASSIUM: 4.9 mmol/L (ref 3.5–5.3)
Sodium: 143 mmol/L (ref 135–146)

## 2016-03-03 NOTE — Progress Notes (Signed)
Pt was started on Eliquis 5mg s BID for Afib on 02/04/2016.    Reviewed patients medication list.  Pt is not  currently on any combined P-gp and strong CYP3A4 inhibitors/inducers (ketoconazole, traconazole, ritonavir, carbamazepine, phenytoin, rifampin, St. [name]'s wort).  Reviewed labs:  SCr 1.36, Weight 89Kg, Age 83yrs old.  Dose appropriate based on specified criteria.   Hgb and HCT 14.1/43.0.  A full discussion of the nature of anticoagulants has been carried out.  A benefit/risk analysis has been presented to the patient, so that they understand the justification for choosing anticoagulation with Eliquis at this time.  The need for compliance is stressed.  Pt is aware to take the medication twice daily.  Side effects of potential bleeding are discussed, including unusual colored urine or stools, coughing up blood or coffee ground emesis, nose bleeds or serious fall or head trauma.  Discussed signs and symptoms of stroke. The patient should avoid any OTC items containing aspirin or ibuprofen.  Avoid alcohol consumption.   Call if any signs of abnormal bleeding.  Discussed financial obligations and resolved any difficulty in obtaining medication.  Next lab test test in 6 months.

## 2016-03-04 ENCOUNTER — Telehealth: Payer: Self-pay | Admitting: *Deleted

## 2016-03-04 NOTE — Telephone Encounter (Signed)
Pt left msg on triage stating that the rx MD sent in "Zetia" is not cover by his insurance co-pay is $80 requesting MD to rx alternative...Arthur [name]

## 2016-03-04 NOTE — Telephone Encounter (Signed)
Unfortunately, there is no other medication similar to this, only the statins and fenofibrate which are different types of meds; if not affordable, I am afraid pt will simply not be able to take

## 2016-03-05 NOTE — Telephone Encounter (Signed)
Pt return call back gave him md response.../lmb 

## 2016-03-05 NOTE — Telephone Encounter (Signed)
Called pt no answer LMOM RTC.../lmb 

## 2016-03-10 ENCOUNTER — Encounter: Payer: Self-pay | Admitting: Cardiovascular Disease

## 2016-03-10 ENCOUNTER — Telehealth: Payer: Self-pay | Admitting: Internal Medicine

## 2016-03-10 NOTE — Telephone Encounter (Signed)
Mr. Lender is calling because he received a message to call Dr. Antionette Char Office , Please call   Thanks

## 2016-03-10 NOTE — Telephone Encounter (Signed)
Pt has been switched from Coumadin to Eliquis and was told at cardiologist office that the prednisone will enhance the thinning of the blood. Pt wants to know if we need to change anything or just watch the levels and "live with it." Pt is still taking prednisone 10mg  daily. Please advise.

## 2016-03-10 NOTE — Telephone Encounter (Signed)
Pt aware.

## 2016-03-10 NOTE — Telephone Encounter (Signed)
Continue the same. He is due for follow-up LFTs in mid May

## 2016-03-10 NOTE — Telephone Encounter (Signed)
This encounter was created in error - please disregard.

## 2016-03-13 ENCOUNTER — Other Ambulatory Visit: Payer: Self-pay | Admitting: *Deleted

## 2016-03-13 MED ORDER — APIXABAN 5 MG PO TABS: 5.0000 mg | ORAL_TABLET | Freq: Two times a day (BID) | ORAL | Status: DC

## 2016-03-21 ENCOUNTER — Ambulatory Visit (INDEPENDENT_AMBULATORY_CARE_PROVIDER_SITE_OTHER): Payer: Medicare Other | Admitting: Neurology

## 2016-03-21 ENCOUNTER — Encounter: Payer: Self-pay | Admitting: Neurology

## 2016-03-21 VITALS — BP 108/60 | HR 49 | Ht 74.0 in | Wt 200.0 lb

## 2016-03-21 DIAGNOSIS — E785 Hyperlipidemia, unspecified: Secondary | ICD-10-CM

## 2016-03-21 DIAGNOSIS — I7121 Aneurysm of the ascending aorta, without rupture: Secondary | ICD-10-CM | POA: Insufficient documentation

## 2016-03-21 DIAGNOSIS — I480 Paroxysmal atrial fibrillation: Secondary | ICD-10-CM

## 2016-03-21 DIAGNOSIS — I712 Thoracic aortic aneurysm, without rupture: Secondary | ICD-10-CM | POA: Diagnosis not present

## 2016-03-21 DIAGNOSIS — I63412 Cerebral infarction due to embolism of left middle cerebral artery: Secondary | ICD-10-CM

## 2016-03-21 DIAGNOSIS — Z7901 Long term (current) use of anticoagulants: Secondary | ICD-10-CM | POA: Diagnosis not present

## 2016-03-21 NOTE — Patient Instructions (Signed)
-   continue eliquis for stroke prevention - check BP at home - Follow up with your primary care physician for stroke risk factor modification. Recommend maintain blood pressure goal <130/80, diabetes with hemoglobin A1c goal below 6.5% and lipids with LDL cholesterol goal below 70 mg/dL. If LDL constantly more than 70, we recommend restart zetia or statin meds - follow up with PCP for kidney function, if Cre more than 1.5, need to readjust eliquis dose.  - healthy diet and regular exercise - follow up in 3 months.

## 2016-03-21 NOTE — Progress Notes (Signed)
STROKE NEUROLOGY FOLLOW UP NOTE  NAME: Arthur [name] DOB: [birth year]  REASON FOR VISIT: stroke follow up HISTORY FROM: pt and chart  Today we had the pleasure of seeing Arthur [name] in follow-up at our Neurology Clinic. Pt was accompanied by no one.   History Summary Arthur [name] is a 82 y.o. male with history of CAD, HLD, afib on coumadin, TIA, HTN, and previous MI, was admitted on 01/31/16 for dysarthria and right face and arm weakness. His coumadin was on hold due to LE vascular procedure. He received IV TPA. MRI showed acute left MCA small infarcts and chronic b/l cerebellar infarcts. MRA head questionable right M2 branch occlusion and right PICA occlusion. Coumadin resumed. EEG and CUS unremarkable. TTE EF 55-60%, but concerning for dilated aortic root. Therefore CTA chest aorta performed and showed unchanged ascending aortic aneurysm, but also found to have small thrombus in the LAA. Therefore, coumadin was changed to eliquis (coumadin only had on dose). He was discharged with eliquis and zetia due to elevated LFTs.   Interval History During the interval time, the patient has been doing well. No recurrent stroke like symptoms. Only complains of easy fatigue. Denies s/s of OSA. Discontinued zetia by PCP. Currently on eliquis tolerating well. BP 108/60.    REVIEW OF SYSTEMS: Full 14 system review of systems performed and notable only for those listed below and in HPI above, all others are negative:  Constitutional:   Cardiovascular:  Ear/Nose/Throat:   Skin:  Eyes:   Respiratory:   Gastroitestinal:   Genitourinary:  Hematology/Lymphatic:   Endocrine:  Musculoskeletal:   Allergy/Immunology:   Neurological:   Psychiatric:  Sleep:   The following represents the patient's updated allergies and side effects list: Allergies  Allergen Reactions  . Penicillins Hives    The neurologically relevant items on the patient's problem list were reviewed on today's  visit.  Neurologic Examination  A problem focused neurological exam (12 or more points of the single system neurologic examination, vital signs counts as 1 point, cranial nerves count for 8 points) was performed.  Blood pressure 108/60, pulse 49, height 6\' 2"  (1.88 m), weight 200 lb (90.719 kg).  General - Well nourished, well developed, in no apparent distress.  Ophthalmologic - Sharp disc margins OU. Fundi not visualized due to .  Cardiovascular - Regular rate and rhythm with no murmur, no afib rhythm.  Mental Status -  Level of arousal and orientation to time, place, and person were intact. Language including expression, naming, repetition, comprehension was assessed and found intact. Fund of Knowledge was assessed and was intact.  Cranial Nerves II - XII - II - Visual field intact OU. III, IV, VI - Extraocular movements intact. V - Facial sensation intact bilaterally. VII - Facial movement intact bilaterally. VIII - Hearing & vestibular intact bilaterally. X - Palate elevates symmetrically. XI - Chin turning & shoulder shrug intact bilaterally. XII - Tongue protrusion intact.  Motor Strength - The patient's strength was normal in all extremities and pronator drift was absent.  Bulk was normal and fasciculations were absent.   Motor Tone - Muscle tone was assessed at the neck and appendages and was normal.  Reflexes - The patient's reflexes were 1+ in all extremities and he had no pathological reflexes.  Sensory - Light touch, temperature/pinprick were assessed and were normal.    Coordination - The patient had normal movements in the hands with no ataxia or dysmetria.  Tremor was absent.  Gait  and Station - The patient's transfers, posture, gait, station, and turns were observed as normal.   Functional score  mRS = 0   0 - No symptoms.   1 - No significant disability. Able to carry out all usual activities, despite some symptoms.   2 - Slight disability. Able to  look after own affairs without assistance, but unable to carry out all previous activities.   3 - Moderate disability. Requires some help, but able to walk unassisted.   4 - Moderately severe disability. Unable to attend to own bodily needs without assistance, and unable to walk unassisted.   5 - Severe disability. Requires constant nursing care and attention, bedridden, incontinent.   6 - Dead.   NIH Stroke Scale = 0   Data reviewed: I personally reviewed the images and agree with the radiology interpretations.  Ct Head Wo Contrast 01/31/2016  1. No acute findings. No intracranial hemorrhage or edema.  2. 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  3. Meningioma overlying the left occipital lobe, stable in size, now partially calcified.   EEG - This awake and drowsy EEG is within normal limits for age. A normal EEG does not exclude a clinical diagnosis of epilepsy. Clinical correlation is advised.  CUS - Bilateral: 1-39% ICA stenosis. Vertebral artery flow is antegrade.  2D echo - Left ventricle: The cavity size was normal. Wall thickness was  increased in a pattern of mild LVH. There was mild focal basal  hypertrophy of the septum. Systolic function was normal. The  estimated ejection fraction was in the range of 55% to 60%. Wall  motion was normal; there were no regional wall motion  abnormalities. - Aortic valve: Valve mobility was restricted. There was mild  stenosis. There was mild regurgitation. - Aortic root: The aortic root was mildly dilated. - Ascending aorta: The ascending aorta was mildly dilated. - Mitral valve: Calcified annulus. There was mild regurgitation. - Left atrium: The atrium was severely dilated. - Right atrium: The atrium was moderately dilated. - Pulmonary arteries: Systolic pressure was mildly increased. PA  peak pressure: 36 mm Hg (S). Impressions: - Normal LV systolic function; biatrial enlargement;  calcified  aortic valve with mild AS (mean gradient 17 mmHg); mild AI; mild  MR; trace TR with mildly elevated pulmonary pressure; mildly  dilated aortic root, ascending aorta and aortic arch; suggest CTA   or MRA to further assess.  CT Angio Chest Aorta with and without CM 02/02/2016 1. Biatrial enlargement with a small thrombus in the left atrial appendage. 2. No increase in size of the previously seen ascending thoracic aortic aneurysm, currently measuring 4.5 cm in maximum diameter. Recommend semi-annual imaging followup by CTA or MRA and referral to cardiothoracic surgery if not already obtained. This recommendation follows 2010 ACCF/AHA/AATS/ACR/ASA/SCA/SCAI/SIR/STS/SVM Guidelines for the Diagnosis and Management of Patients With Thoracic Aortic Disease. Circulation. 2010; 121: [id number] 3. Small to moderate-sized right pleural effusion and small left pleural effusion. 4. Bilateral dependent atelectasis, greater on the right. 5. Dense atheromatous coronary artery calcifications.   MRI Head 02/02/2016 1. Patchy small volume acute ischemic nonhemorrhagic left MCA territory infarcts as above. 2. Remote bilateral cerebellar infarcts, right larger than left. 3. Moderate generalized age-related cerebral atrophy. 4. 2.1 cm left parietal convexity meningioma. No associated edema.  MRA Head  02/02/2016 1. No definite large or proximal arterial branch occlusion within the intracranial circulation. 2. Question proximal right M2 branch occlusion, not entirely certain given the absence of acute or chronic  infarct within this territory. Finding may be related to motion artifact on this exam. 3. Probable proximal nonocclusive moderate M2 stenoses bilaterally. 4. Diminutive vertebrobasilar system with fetal origin of the right PCA. Left PCA also largely supplied via a prominent left posterior communicating artery. 5. Nonvisualization of the right PICA, which may be occluded given the remote  right cerebellar infarct.  Component     Latest Ref Rng 02/01/2016  Cholesterol     0 - 200 mg/dL 161  Triglycerides     <150 mg/dL 58  HDL Cholesterol     >40 mg/dL 46  Total CHOL/HDL Ratio      3.5  VLDL     0 - 40 mg/dL 12  LDL (calc)     0 - 99 mg/dL 103 (H)  Hemoglobin A1C     4.8 - 5.6 % 5.9 (H)  Mean Plasma Glucose      123    Assessment: As you may recall, he is a 82 y.o. Caucasian male with PMH of CAD, HLD, afib on coumadin, TIA, HTN, and previous MI who was admitted on 01/31/16 for dysarthria and right face and arm weakness. His coumadin was on hold due to LE vascular procedure. He received IV TPA. MRI showed acute left MCA small infarcts and chronic b/l cerebellar infarcts. MRA head questionable right M2 branch occlusion and right PICA occlusion. Coumadin resumed. EEG and CUS unremarkable. TTE EF 55-60%, but concerning for dilated aortic root. CTA chest aorta showed unchanged ascending aortic aneurysm, but also found to have small thrombus in the LAA. Therefore, coumadin was changed to eliquis (coumadin only had on dose). He was discharged with eliquis and zetia due to elevated LFTs. During the interval time, the patient has been doing well. Only complains of easy fatigue. Discontinued zetia by PCP.  Plan:  - continue eliquis for stroke prevention - check BP at home - Follow up with primary care physician for stroke risk factor modification. Recommend maintain blood pressure goal <130/80, diabetes with hemoglobin A1c goal below 6.5% and lipids with LDL cholesterol goal below 70 mg/dL. If LDL constantly more than 70, we recommend restart zetia or statin meds - follow up with PCP for kidney function, if Cre more than 1.5, need to readjust eliquis dose.  - healthy diet and regular exercise - follow up in 3 months.  I spent more than 25 minutes of face to face time with the patient. Greater than 50% of time was spent in counseling and coordination of care. We discussed compliance  with medication, follow up with PCP.    No orders of the defined types were placed in this encounter.    No orders of the defined types were placed in this encounter.    Patient Instructions  - continue eliquis for stroke prevention - check BP at home - Follow up with your primary care physician for stroke risk factor modification. Recommend maintain blood pressure goal <130/80, diabetes with hemoglobin A1c goal below 6.5% and lipids with LDL cholesterol goal below 70 mg/dL. If LDL constantly more than 70, we recommend restart zetia or statin meds - follow up with PCP for kidney function, if Cre more than 1.5, need to readjust eliquis dose.  - healthy diet and regular exercise - follow up in 3 months.     Rosalin Hawking, MD PhD [facility] [address], [place], [place] [address] [phone]

## 2016-03-25 ENCOUNTER — Telehealth: Payer: Self-pay | Admitting: Cardiovascular Disease

## 2016-03-25 NOTE — Telephone Encounter (Signed)
I spoke with the pt and he said [facility] contacted him about an hour ago to question whether he should be taking Eliquis.  I made the pt aware that he needs to continue this medication.  I will follow-up with [facility].

## 2016-03-25 NOTE — Telephone Encounter (Signed)
New MEssage\  Pt requested tos peak w. RN- would not specify nature of phone call. Please call back and discuss.

## 2016-03-27 NOTE — Telephone Encounter (Signed)
I contacted [facility] and spoke with Jenny Reichmann. He said the pt does have an Eliquis prescription in process for refill.  I also did confirm that Coumadin is no longer on the pt's medication profile. I spoke with the pt and made him aware of this information.

## 2016-03-27 NOTE — Telephone Encounter (Signed)
F/u   Pt following up on Eliquis from [facility]. Please call back and discuss.

## 2016-04-01 ENCOUNTER — Telehealth: Payer: Self-pay

## 2016-04-01 ENCOUNTER — Other Ambulatory Visit: Payer: Self-pay

## 2016-04-01 DIAGNOSIS — R7989 Other specified abnormal findings of blood chemistry: Secondary | ICD-10-CM

## 2016-04-01 DIAGNOSIS — R945 Abnormal results of liver function studies: Principal | ICD-10-CM

## 2016-04-01 NOTE — Telephone Encounter (Signed)
-----   Message from Algernon Huxley, RN sent at 03/10/2016  1:48 PM EDT ----- Regarding: LFT's LFT's due in mid-may

## 2016-04-01 NOTE — Telephone Encounter (Signed)
Pt aware.

## 2016-04-03 ENCOUNTER — Other Ambulatory Visit: Payer: Medicare Other

## 2016-04-03 ENCOUNTER — Other Ambulatory Visit (INDEPENDENT_AMBULATORY_CARE_PROVIDER_SITE_OTHER): Payer: Medicare Other

## 2016-04-03 DIAGNOSIS — R945 Abnormal results of liver function studies: Principal | ICD-10-CM

## 2016-04-03 DIAGNOSIS — R7989 Other specified abnormal findings of blood chemistry: Secondary | ICD-10-CM

## 2016-04-03 LAB — HEPATIC FUNCTION PANEL
ALK PHOS: 73 U/L (ref 39–117)
ALT: 74 U/L — ABNORMAL HIGH (ref 0–53)
AST: 60 U/L — ABNORMAL HIGH (ref 0–37)
Albumin: 3.7 g/dL (ref 3.5–5.2)
BILIRUBIN DIRECT: 0.2 mg/dL (ref 0.0–0.3)
Total Bilirubin: 1 mg/dL (ref 0.2–1.2)
Total Protein: 6.7 g/dL (ref 6.0–8.3)

## 2016-04-07 ENCOUNTER — Other Ambulatory Visit: Payer: Self-pay

## 2016-04-07 DIAGNOSIS — K754 Autoimmune hepatitis: Secondary | ICD-10-CM

## 2016-04-09 ENCOUNTER — Telehealth: Payer: Self-pay

## 2016-04-09 NOTE — Telephone Encounter (Signed)
Patient is on my [employer] List for 2017 and may be a good candidate for an AWV. LOV with PCP was Feb 27, 2016 and not coded as an AWV. Pt has an appt in 08/2016

## 2016-05-06 ENCOUNTER — Other Ambulatory Visit (HOSPITAL_BASED_OUTPATIENT_CLINIC_OR_DEPARTMENT_OTHER): Payer: Medicare Other

## 2016-05-06 ENCOUNTER — Ambulatory Visit (HOSPITAL_COMMUNITY)
Admission: RE | Admit: 2016-05-06 | Discharge: 2016-05-06 | Disposition: A | Discharge status: Home / Self Care | Payer: Medicare Other | Source: Ambulatory Visit | Attending: Oncology | Admitting: Oncology

## 2016-05-06 ENCOUNTER — Other Ambulatory Visit: Payer: Self-pay | Admitting: Oncology

## 2016-05-06 ENCOUNTER — Telehealth: Payer: Self-pay | Admitting: *Deleted

## 2016-05-06 DIAGNOSIS — K8020 Calculus of gallbladder without cholecystitis without obstruction: Secondary | ICD-10-CM | POA: Diagnosis not present

## 2016-05-06 DIAGNOSIS — C439 Malignant melanoma of skin, unspecified: Secondary | ICD-10-CM | POA: Diagnosis not present

## 2016-05-06 DIAGNOSIS — I2510 Atherosclerotic heart disease of native coronary artery without angina pectoris: Secondary | ICD-10-CM | POA: Insufficient documentation

## 2016-05-06 DIAGNOSIS — Z85820 Personal history of malignant melanoma of skin: Secondary | ICD-10-CM

## 2016-05-06 DIAGNOSIS — I517 Cardiomegaly: Secondary | ICD-10-CM | POA: Insufficient documentation

## 2016-05-06 DIAGNOSIS — I700 Atherosclerosis of aorta: Secondary | ICD-10-CM | POA: Diagnosis not present

## 2016-05-06 DIAGNOSIS — J90 Pleural effusion, not elsewhere classified: Secondary | ICD-10-CM | POA: Insufficient documentation

## 2016-05-06 LAB — CBC WITH DIFFERENTIAL/PLATELET
BASO%: 0.7 % (ref 0.0–2.0)
BASOS ABS: 0.1 10*3/uL (ref 0.0–0.1)
EOS%: 1.1 % (ref 0.0–7.0)
Eosinophils Absolute: 0.1 10*3/uL (ref 0.0–0.5)
HCT: 44.2 % (ref 38.4–49.9)
HGB: 14.2 g/dL (ref 13.0–17.1)
LYMPH#: 4.6 10*3/uL — AB (ref 0.9–3.3)
LYMPH%: 55.3 % — AB (ref 14.0–49.0)
MCH: 31.8 pg (ref 27.2–33.4)
MCHC: 32.1 g/dL (ref 32.0–36.0)
MCV: 99.2 fL — ABNORMAL HIGH (ref 79.3–98.0)
MONO#: 1 10*3/uL — ABNORMAL HIGH (ref 0.1–0.9)
MONO%: 11.5 % (ref 0.0–14.0)
NEUT#: 2.6 10*3/uL (ref 1.5–6.5)
NEUT%: 31.4 % — ABNORMAL LOW (ref 39.0–75.0)
PLATELETS: 111 10*3/uL — AB (ref 140–400)
RBC: 4.45 10*6/uL (ref 4.20–5.82)
RDW: 16.7 % — AB (ref 11.0–14.6)
WBC: 8.3 10*3/uL (ref 4.0–10.3)

## 2016-05-06 LAB — COMPREHENSIVE METABOLIC PANEL
ALT: 89 U/L — AB (ref 0–55)
ANION GAP: 9 meq/L (ref 3–11)
AST: 76 U/L — ABNORMAL HIGH (ref 5–34)
Albumin: 3.1 g/dL — ABNORMAL LOW (ref 3.5–5.0)
Alkaline Phosphatase: 73 U/L (ref 40–150)
BUN: 24.7 mg/dL (ref 7.0–26.0)
CALCIUM: 8.8 mg/dL (ref 8.4–10.4)
CHLORIDE: 105 meq/L (ref 98–109)
CO2: 26 meq/L (ref 22–29)
CREATININE: 1.4 mg/dL — AB (ref 0.7–1.3)
EGFR: 43 mL/min/{1.73_m2} — ABNORMAL LOW (ref >90)
Glucose: 146 mg/dl — ABNORMAL HIGH (ref 70–140)
POTASSIUM: 4.9 meq/L (ref 3.5–5.1)
Sodium: 139 mEq/L (ref 136–145)
Total Bilirubin: 1.01 mg/dL (ref 0.20–1.20)
Total Protein: 6.5 g/dL (ref 6.4–8.3)

## 2016-05-06 LAB — TECHNOLOGIST REVIEW

## 2016-05-06 LAB — LACTATE DEHYDROGENASE: LDH: 253 U/L — AB (ref 125–245)

## 2016-05-06 LAB — GLUCOSE, CAPILLARY: Glucose-Capillary: 103 mg/dL — ABNORMAL HIGH (ref 65–99)

## 2016-05-06 MED ORDER — FLUDEOXYGLUCOSE F - 18 (FDG) INJECTION
9.7000 | Freq: Once | INTRAVENOUS | Status: AC | PRN
  Administered 2016-05-06: 9.7 via INTRAVENOUS

## 2016-05-06 NOTE — Telephone Encounter (Signed)
Call to [name], LVM to call back to schedule AWV; To note; Dr. Jenny Reichmann noted "wellness" exam but did not bill G code or list other wellness information. The patient was given my number for fup

## 2016-05-06 NOTE — Telephone Encounter (Signed)
Glen with [facility] called asking for today's PET scan to be entered as a whole body.  Dr. Alen Blew notified.

## 2016-05-06 NOTE — Telephone Encounter (Signed)
The patient called back; stated he had a stroke; PET scan this am; Agreed to AWV when he comes on 10/5 to see Dr. Jenny Reichmann at 9:15 and will have AWV at 9:30

## 2016-05-08 ENCOUNTER — Other Ambulatory Visit: Payer: Medicare Other

## 2016-05-08 ENCOUNTER — Telehealth: Payer: Self-pay | Admitting: Oncology

## 2016-05-08 ENCOUNTER — Ambulatory Visit (HOSPITAL_BASED_OUTPATIENT_CLINIC_OR_DEPARTMENT_OTHER): Payer: Medicare Other | Admitting: Oncology

## 2016-05-08 VITALS — BP 177/77 | HR 70 | Temp 97.6°F | Resp 19 | Ht 74.0 in | Wt 203.7 lb

## 2016-05-08 DIAGNOSIS — C439 Malignant melanoma of skin, unspecified: Secondary | ICD-10-CM

## 2016-05-08 DIAGNOSIS — Z85820 Personal history of malignant melanoma of skin: Secondary | ICD-10-CM

## 2016-05-08 NOTE — Addendum Note (Signed)
Addended by: Randolm Idol on: 05/08/2016 11:38 AM   Modules accepted: Orders, Medications

## 2016-05-08 NOTE — Progress Notes (Signed)
Hematology and Oncology Follow Up Visit  Arthur [name] [id number] [birth year] 82 y.o. 05/08/2016 9:11 AM Arthur [name], MDHopper, Arthur Penna, MD   Principle Diagnosis: 82 year old gentleman diagnosed with Malignant melanoma initially diagnosed in 2012 and found to have a T2b lesion with the depth of invasion of 1.75 mm and a Clark's level IV. He developed regional relapse in October of 2014.    Prior Therapy: 1. In May of 2012, he underwent wide excision and sentinel lymph node biopsy done by Dr. Hassell Done which showed no lymph node involvement and no residual malignant melanoma.  2. he is status post Re-excision of site of melanoma satellite lesion of left forearm. This was done on 08/23/2013.   Current therapy: Observation and surveillance.  Interim History:  [name] presents today for a followup visit. Since the last visit, he he was hospitalized in March 2017 for an acute CVA. He presented with expressive aphasia which have resolved at this time. He is currently chronically anticoagulated with Eliquis. He reports no residual neurological deficits at this time and ambulating without difficulties. He is able to drive and resumed activities of daily living.   He does not report any skin rashes or lesions. Has not reported any lymphadenopathy or masses. He does report exertional dyspnea and lower extremity edema at times. He continues to be on prednisone for his autoimmune hepatitis.   He does not report any headaches or blurry vision or double vision. Does not report any chest pain or shortness of breath. As that report any cough or hemoptysis. Does not report any nausea or vomiting or abdominal pain. Did not report any hematochezia or melena. Did not report any frequency urgency or hesitancy. Does not report any musculoskeletal complaints. Her recent rashes or lesions. The rest of the review of system is unremarkable.   Medications: I have reviewed the patient's current medications.  Current  Outpatient Prescriptions  Medication Sig Dispense Refill  . albuterol (PROVENTIL HFA;VENTOLIN HFA) 108 (90 BASE) MCG/ACT inhaler Inhale 2 puffs into the lungs every 6 (six) hours as needed for wheezing or shortness of breath. 1 Inhaler 2  . amLODipine (NORVASC) 5 MG tablet     . apixaban (ELIQUIS) 5 MG TABS tablet Take 1 tablet (5 mg total) by mouth 2 (two) times daily. 180 tablet 1  . ezetimibe (ZETIA) 10 MG tablet     . metoprolol tartrate (LOPRESSOR) 25 MG tablet TAKE 1 TABLET DAILY 90 tablet 1  . predniSONE (DELTASONE) 10 MG tablet Take 1 tablet (10 mg total) by mouth daily. 100 tablet 3   No current facility-administered medications for this visit.     Allergies:  Allergies  Allergen Reactions  . Penicillins Hives       Physical Exam: Blood pressure 177/77, pulse 70, temperature 97.6 F (36.4 C), temperature source Oral, resp. rate 19, height 6\' 2"  (1.88 m), weight 203 lb 11.2 oz (92.398 kg), SpO2 97 %. ECOG: 1 General appearance: Well-appearing gentleman appeared mildly dyspneic. Head: Normocephalic, without any masses or lesions. No oral thrush noted. Neck: no adenopathy or thyromegaly. Lymph nodes: Cervical, supraclavicular, and axillary nodes normal. No inguinal adenopathy noted either. Heart:regular rate and rhythm, S1, S2 normal, no murmur, click, rub or gallop Lung:chest clear, no wheezing, rales, normal symmetric air entry. Decreased breath sounds at the bases. Abdomin: soft, non-tender, without masses or organomegaly no rebound or guarding. EXT:no erythema, induration, or nodules Skin showed no rashes or lesions.   Lab Results: Lab Results  Component Value Date  WBC 8.3 05/06/2016   HGB 14.2 05/06/2016   HCT 44.2 05/06/2016   MCV 99.2* 05/06/2016   PLT 111* 05/06/2016     Chemistry      Component Value Date/Time   NA 139 05/06/2016 0857   NA 143 03/03/2016 1602   K 4.9 05/06/2016 0857   K 4.9 03/03/2016 1602   CL 107 03/03/2016 1602   CO2 26  05/06/2016 0857   CO2 25 03/03/2016 1602   BUN 24.7 05/06/2016 0857   BUN 24 03/03/2016 1602   CREATININE 1.4* 05/06/2016 0857   CREATININE 1.36* 03/03/2016 1602   CREATININE 1.15 02/04/2016 0503      Component Value Date/Time   CALCIUM 8.8 05/06/2016 0857   CALCIUM 8.9 03/03/2016 1602   ALKPHOS 73 05/06/2016 0857   ALKPHOS 73 04/03/2016 1325   AST 76* 05/06/2016 0857   AST 60* 04/03/2016 1325   ALT 89* 05/06/2016 0857   ALT 74* 04/03/2016 1325   BILITOT 1.01 05/06/2016 0857   BILITOT 1.0 04/03/2016 1325     EXAM: NUCLEAR MEDICINE PET SKULL BASE TO THIGH  TECHNIQUE: 9.9 mCi F-18 FDG was injected intravenously. Full-ring PET imaging was performed from the skull base to thigh after the radiotracer. CT data was obtained and used for attenuation correction and anatomic localization.  FASTING BLOOD GLUCOSE: Value: 103. Mg/dl  COMPARISON: 04/24/2015  FINDINGS: NECK  No hypermetabolic lymph nodes in the neck.  CHEST  Heart size is enlarged. There is aortic atherosclerosis and 3 vessel coronary artery calcification. No hypermetabolic mediastinal or hilar lymph nodes. Bilateral pleural effusions are noted, right greater than left. No hypermetabolic pulmonary nodules or masses identified. There is a 7 mm left upper lobe nodule, image 35 of series 6. This is too small to characterize by PET-CT, but unchanged from 04/24/2015 favoring a benign abnormality. No hypermetabolic mediastinal or hilar nodes. No suspicious pulmonary nodules on the CT scan.  ABDOMEN/PELVIS  No abnormal hypermetabolic activity within the liver, pancreas, adrenal glands, or spleen. Gallstones identified. Aortic atherosclerosis noted. No hypermetabolic lymph nodes in the abdomen or pelvis.  SKELETON  No focal hypermetabolic activity to suggest skeletal metastasis.  IMPRESSION: 1. No evidence for hypermetabolic tumor. 2. Cardiac enlargement, aortic atherosclerosis and 3 vessel  coronary artery calcification. There are bilateral pleural effusions noted right greater than left. Findings may be the sequelae of CHF. 3. Gallstones.       82 year old gentleman with the following issues:   1. Malignant melanoma initially diagnosed in 2012 and found to have a T2b lesion with the depth of invasion of 1.75 mm and a Clark's level IV. No ulcerations or vascular invasion was noted and was treated with a wide excision and a sentinel lymph node biopsy that was negative. He had a punch biopsy of a lesion in his left elbow which showed deposits of atypical melanocytes. He is status post reexcision of his melanoma lesion done on 08/23/2013. The pathology reveals area of malignant melanoma with negative resection margins.   His PET CT scan on 05/06/2016 was reviewed today and showed no evidence of recurrent disease. His physical examination and laboratory data do not support any recurrence. The plan is to continue with active surveillance with clinical visit every 6 months and imaging studies annually.  2. History of CVA: He does have atrial fibrillation and chronically anticoagulated on Eliquis.  3. Cardiac enlargement and bilateral effusion: Likely related to congestive heart failure. He follows with cardiology regularly at this time.  4. Followup: Will  be in 6 months for clinical visit.    [facility], MD 6/22/20179:11 AM

## 2016-05-08 NOTE — Telephone Encounter (Signed)
per pof to sch pt-gave pt copy of avs °

## 2016-05-09 ENCOUNTER — Telehealth: Payer: Self-pay | Admitting: Cardiovascular Disease

## 2016-05-09 NOTE — Telephone Encounter (Signed)
Follow up      Calling to give nurse cell number for call back [phone]

## 2016-05-09 NOTE — Telephone Encounter (Signed)
I spoke with the pt and wanted to let Dr Burt Knack know that he had a PET scan and it showed cardiac enlargement.  The pt wanted to know if this is something to be concerned about and whether he needs further evaluation.  I made him aware that this is not a new finding and he has a history of cardiomegaly.  I advised the pt that he should continue his current medications and have routine cardiology follow-up.

## 2016-05-09 NOTE — Telephone Encounter (Signed)
New message     The pt needs the nurse to call him no other information provided

## 2016-05-11 NOTE — Telephone Encounter (Signed)
Agree. thanks

## 2016-05-21 ENCOUNTER — Telehealth: Payer: Self-pay | Admitting: Internal Medicine

## 2016-05-21 NOTE — Telephone Encounter (Signed)
Pt called stated that he fax over medical clearance yesterday for Dr. Alanda Slim to get cataract surgery. Please check and call pt back its fax over  Phone # [phone]

## 2016-05-21 NOTE — Telephone Encounter (Signed)
Dr office called to check on status of fax for Arthur [name]'s cataract surgery. They have not received it yet and wanted to make sure you have the correct fax #. The fax # is [phone]. Thanks.

## 2016-05-22 NOTE — Telephone Encounter (Signed)
Called patient and left message, I have not received anything from the office

## 2016-06-10 ENCOUNTER — Telehealth: Payer: Self-pay | Admitting: Neurology

## 2016-06-10 NOTE — Telephone Encounter (Signed)
Rn call patient about his appt being change to 07/08/2016. Pt had appt on 06/23/2016 but had to cancel because he is out of town. Rn ask patient was he having any neurological issus going on. Pt stated he is doing fine and not having any issues. Rn stated if he is stable with no neurological issues going on he can wait to be seen on 07/08/2016. Pt verbalized understanding.

## 2016-06-10 NOTE — Telephone Encounter (Signed)
Pt called said he will be out of town on 8/7 and next available is 8/22 which I scheduled for him. He is requesting to be seen sooner. Can this pt see Hoyle Sauer?

## 2016-06-23 ENCOUNTER — Encounter: Payer: Self-pay | Admitting: Internal Medicine

## 2016-06-23 ENCOUNTER — Ambulatory Visit: Payer: Medicare Other | Admitting: Neurology

## 2016-07-01 ENCOUNTER — Telehealth: Payer: Self-pay | Admitting: Cardiovascular Disease

## 2016-07-01 DIAGNOSIS — J90 Pleural effusion, not elsewhere classified: Secondary | ICD-10-CM

## 2016-07-01 NOTE — Telephone Encounter (Signed)
The pt brought notes into the office which did not include H&P, DC summary or Echo.  While in the office they asked to speak with Truitt Merle NP and she arranged appointment for tomorrow.  I have called and requested all medical records from [facility].

## 2016-07-01 NOTE — Telephone Encounter (Signed)
16 min phone call.  The pt called the office because he was hospitalized last week at [facility] due to congestion. They felt like this was heart related and initially told the pt that he needed to have a heart valve surgery and would need to be transferred to [facility]. Then an echocardiogram was performed and that overall seemed ok. Congestion was related to fluid and they began giving the pt lasix and he lost 8 lbs. They also told the pt that his Metoprolol Tartrate was prescribed incorrectly and they switched him to Metoprolol Succinate.  BNP 1290 on admission and 100 at discharge yesterday. The pt said he will bring hospital records to the office today for review and he also said that Dr Caswell Corwin at [facility] would like Dr Burt Knack to contact him today by cell [phone]) to further discuss the pt's case.

## 2016-07-01 NOTE — Telephone Encounter (Signed)
New message       [place]ing to give update [place] the beach.

## 2016-07-02 ENCOUNTER — Ambulatory Visit (INDEPENDENT_AMBULATORY_CARE_PROVIDER_SITE_OTHER): Payer: Medicare Other | Admitting: Nurse Practitioner

## 2016-07-02 ENCOUNTER — Encounter: Payer: Self-pay | Admitting: Nurse Practitioner

## 2016-07-02 VITALS — BP 130/100 | HR 72 | Resp 97 | Ht 74.0 in | Wt 194.4 lb

## 2016-07-02 DIAGNOSIS — Z7901 Long term (current) use of anticoagulants: Secondary | ICD-10-CM

## 2016-07-02 DIAGNOSIS — I482 Chronic atrial fibrillation, unspecified: Secondary | ICD-10-CM

## 2016-07-02 DIAGNOSIS — I5032 Chronic diastolic (congestive) heart failure: Secondary | ICD-10-CM

## 2016-07-02 DIAGNOSIS — I259 Chronic ischemic heart disease, unspecified: Secondary | ICD-10-CM | POA: Diagnosis not present

## 2016-07-02 DIAGNOSIS — J90 Pleural effusion, not elsewhere classified: Secondary | ICD-10-CM

## 2016-07-02 DIAGNOSIS — J948 Other specified pleural conditions: Secondary | ICD-10-CM

## 2016-07-02 NOTE — Telephone Encounter (Signed)
Truitt Merle office note reviewed. Appreciate her care.

## 2016-07-02 NOTE — Progress Notes (Addendum)
CARDIOLOGY OFFICE NOTE  Date:  07/02/2016    Arthur [name] Date of Birth: 01-14-1925 Medical Record [id number]  PCP:  [name], MD  Cardiologist:  Jerel Shepherd    Chief Complaint  Patient presents with  . Congestive Heart Failure  . Atrial Fibrillation  . Shortness of Breath    Work in visit - seen for Dr. Burt Knack    History of Present Illness: Arthur [name] is a 82 y.o. male who presents today for a work in visit. Seen for Dr. Burt Knack.   He has a history of chronic atrial fib, HLD, HTN, prior TIA, coronary artery disease with history of anteroseptal and inferior wall MIs in the 1990s, and previous stenting of the LAD in 2000. He remains on chronic coumadin therapy. He has had issues with varicose veins - followed by Dr. Kellie Simmering. Chronically elevated LFTs - not on statin therapy. Has had melanoma - sees Dr. Alen Blew. He is treated for autoimmune hepatitis.   Last seen in November by Dr. Burt Knack.   Admitted back in December with a right lung consolidation/pneumonia. BNP was elevated at that time at 700.   I last saw him back at the end of March - had had a recent vein procedure - had stopped his coumadin for 5 days and was not bridged - had had 2 doses post op and then suffered a stroke - got admitted - he was treated with TPA - recovered nicely with no deficit. Coumadin was changed to Eliquis. He had a CT angio of the chest showing a small to moderate right effusion at that time.   He walked into the office yesterday - had been admitted to [facility] for 2 days over the past weekend with ?CHF - had thoracentesis and lots of testing. Needed appointment. Thus added to today's schedule.   [place] and [place]. BNP was elevated at almost 1300. Echo basically unchanged from ours - still with mild AS. Normal EF. Had thoracentesis of 1400 cc of clear fluid - analysis with 467 white cells, 11% polys and 97%  mononuclear cells. Indeterminate diastolic function on echo due to the AF. CXR with a right lung opacification. Does not look like he got any antibiotics. Cardiac cath was recommended - he wanted to have this done here if needed. Discharged on low dose Lasix. Short acting Toprol changed to long acting XL - not really clear as to why.    Comes back today. Here with wife and daughter. Lots of questions. He says he is feeling ok. Weight is down to 189 at home. Breathing has improved. No chest pain. No fever. Little cough last night according to his wife. Asking about his long term use of Prednisone - does not remember seeing GI - although he was given Prednisone by Dr. Henrene Pastor. Not dizzy. BP had been running higher at home. Now on long acting Toprol. No syncope. Trying to use his treadmill for about 10 to 15 minutes a day but was getting short of breath. Recent PET negative but did show effusions and atherosclerosis and cardiomegaly.   Past Medical History:  Diagnosis Date  . Arrhythmia    1995  . Atrial fibrillation ([place])   . Coronary artery disease    MI, atherectomy 1993  . DDD (degenerative disc disease)   . Eczema   . Elevated LFTs   . History of gallstones   . History  of transient ischemic attack (TIA) 2005   double vision  . Hyperlipidemia   . Hypertension   . Myocardial infarct ([place]) 12/98   inferior  . Pneumonia    long time ago  . Skin cancer   . Stroke ([place])   . Varicose veins     Past Surgical History:  Procedure Laterality Date  . AMPUTATION Right 05/10/2013   Procedure: RIGHT SECOND TOE AMPUTATION;  Surgeon: Hessie Dibble, MD;  Location: [facility];  Service: Orthopedics;  Laterality: Right;  . APPENDECTOMY    . CARDIAC CATHETERIZATION  04/08/99   LAD 70% INTER 40-50% RCA 30-40%  . CORONARY ANGIOPLASTY  04/08/99   LAD  . CORONARY ANGIOPLASTY  10/17/97   RCA   . CORONARY ANGIOPLASTY WITH STENT PLACEMENT  04/08/99   LAD  . MELANOMA EXCISION  2013    lt arm  . MELANOMA EXCISION Left 08/23/2013   Procedure: MELANOMA EXCISION;  Surgeon: Pedro Earls, MD;  Location: [facility];  Service: General;  Laterality: Left;  . TONSILLECTOMY    . VIDEO BRONCHOSCOPY Bilateral 05/07/2015   Procedure: VIDEO BRONCHOSCOPY WITHOUT FLUORO;  Surgeon: Rigoberto Noel, MD;  Location: [facility];  Service: Cardiopulmonary;  Laterality: Bilateral;     Medications: Current Outpatient Prescriptions  Medication Sig Dispense Refill  . furosemide (LASIX) 20 MG tablet Take 20 mg by mouth daily.    . metoprolol succinate (TOPROL-XL) 25 MG 24 hr tablet Take 25 mg by mouth daily.    Marland Kitchen albuterol (PROVENTIL HFA;VENTOLIN HFA) 108 (90 BASE) MCG/ACT inhaler Inhale 2 puffs into the lungs every 6 (six) hours as needed for wheezing or shortness of breath. 1 Inhaler 2  . apixaban (ELIQUIS) 5 MG TABS tablet Take 1 tablet (5 mg total) by mouth 2 (two) times daily. 180 tablet 1  . predniSONE (DELTASONE) 10 MG tablet Take 1 tablet (10 mg total) by mouth daily. 100 tablet 3   No current facility-administered medications for this visit.     Allergies: Allergies  Allergen Reactions  . Penicillins Hives    Social History: The patient  reports that he quit smoking about 48 years ago. His smoking use included Cigars and Pipe. He quit after 7.00 years of use. He has never used smokeless tobacco. He reports that he drinks about 0.6 oz of alcohol per week . He reports that he does not use drugs.   Family History: The patient's family history includes Heart attack (age of onset: 73) in his mother; Pneumonia in his father.   Review of Systems: Please see the history of present illness.   Otherwise, the review of systems is positive for none.   All other systems are reviewed and negative.   Physical Exam: VS:  BP (!) 130/100   Pulse 72   Resp (!) 97 Comment: at rest  Ht 6\' 2"  (1.88 m)   Wt 194 lb 6.4 oz (88.2 kg)   BMI 24.96 kg/m  .  BMI Body mass index is 24.96 kg/m.  Wt  Readings from Last 3 Encounters:  07/02/16 194 lb 6.4 oz (88.2 kg)  05/08/16 203 lb 11.2 oz (92.4 kg)  03/21/16 200 lb (90.7 kg)   Repeat BP by me is 122/84. General: Pleasant. Elderly. He is alert and in no acute distress.   HEENT: Normal.  Neck: Supple, no JVD, carotid bruits, or masses noted.  Cardiac: Irregular irregular rhythm. Rate is ok. Outflow murmur noted. Chronic lower edema.  Respiratory:  Lungs are  clear to auscultation bilaterally with normal work of breathing. May be just slightly diminished on the right but overall his lungs are clear.  GI: Soft and nontender.  MS: No deformity or atrophy. Gait and ROM intact.  Skin: Warm and dry. Color is normal.  Neuro:  Strength and sensation are intact and no gross focal deficits noted.  Psych: Alert, appropriate and with normal affect.   LABORATORY DATA:  EKG:  EKG is not ordered today.  Lab Results  Component Value Date   WBC 8.3 05/06/2016   HGB 14.2 05/06/2016   HCT 44.2 05/06/2016   PLT 111 (L) 05/06/2016   GLUCOSE 146 (H) 05/06/2016   CHOL 161 02/01/2016   TRIG 58 02/01/2016   HDL 46 02/01/2016   LDLCALC 103 (H) 02/01/2016   ALT 89 (H) 05/06/2016   AST 76 (H) 05/06/2016   NA 139 05/06/2016   K 4.9 05/06/2016   CL 107 03/03/2016   CREATININE 1.4 (H) 05/06/2016   BUN 24.7 05/06/2016   CO2 26 05/06/2016   TSH 1.64 10/09/2009   PSA 5.69 (H) 10/09/2009   INR 1.65 (H) 02/04/2016   HGBA1C 5.9 (H) 02/01/2016    BNP (last 3 results)  Recent Labs  10/28/15 1105  BNP 700.1*    ProBNP (last 3 results) No results for input(s): PROBNP in the last 8760 hours.   Other Studies Reviewed Today:  Echo Study Conclusions from 01/2016  - Left ventricle: The cavity size was normal. Wall thickness was   increased in a pattern of mild LVH. There was mild focal basal   hypertrophy of the septum. Systolic function was normal. The   estimated ejection fraction was in the range of 55% to 60%. Wall   motion was normal;  there were no regional wall motion   abnormalities. - Aortic valve: Valve mobility was restricted. There was mild   stenosis. There was mild regurgitation. - Aortic root: The aortic root was mildly dilated. - Ascending aorta: The ascending aorta was mildly dilated. - Mitral valve: Calcified annulus. There was mild regurgitation. - Left atrium: The atrium was severely dilated. - Right atrium: The atrium was moderately dilated. - Pulmonary arteries: Systolic pressure was mildly increased. PA   peak pressure: 36 mm Hg (S).  Impressions:  - Normal LV systolic function; biatrial enlargement; calcified   aortic valve with mild AS (mean gradient 17 mmHg); mild AI; mild   MR; trace TR with mildly elevated pulmonary pressure; mildly   dilated aortic root, ascending aorta and aortic arch; suggest CTA   or MRA to further assess  Assessment/Plan: 1. Probable acute diastolic HF - most likely multifactorial - continue with Lasix and recheck his labs today. Restrict salt. Daily weights.   2. Right lung opacification/effusion - s/p thoracentesis - recheck CXR on Friday. May need to repeat again in about a month. May need to consider CT scan as well. For now, will continue with low dose diuretic. May need to send to pulmonary. Further disposition to follow. To watch for fever/chills, etc.   3. Chronic atrial fib - rate is ok.   4. Chronic anticoagulation with Eliquis - no problems noted.  5. Chronic lower extremity edema  6. CAD - no active symptoms. Would favor conservative management.   7. Mild AS - favor continued follow up for now. No indication for cardiac catheterization.   Current medicines are reviewed with the patient today.  The patient does not have concerns regarding medicines other than what has  been noted above.  The following changes have been made:  See above.  Labs/ tests ordered today include:    Orders Placed This Encounter  Procedures  . DG Chest 2 View  . Brain  natriuretic peptide  . Basic metabolic panel  . CBC  . Hepatic function panel     Disposition:   FU with me & Dr. Burt Knack in one month with possible CXR on return.   Patient is agreeable to this plan and will call if any problems develop in the interim.   Signed: [name], RN, ANP-C 07/02/2016 2:45 PM  [facility] [address] [place], [place]  [address] Phone: [phone] Fax: [phone]

## 2016-07-02 NOTE — Patient Instructions (Addendum)
We will be checking the following labs today - BNP, CBC, HPF and BMET   Medication Instructions:    Continue with your current medicines.     Testing/Procedures To Be Arranged:  CXR on Friday - Please go to [facility] to [place] on the first floor for a chest Xray - you may walk in.    Follow-Up:   See me & Dr. Burt Knack in about a month - will probably do CXR prior to that visit - but will let you know if you have to go back and have repeated    Other Special Instructions:   Keep restricting your salt  Weigh daily - call if your weight goes up over 3 pounds overnight    If you need a refill on your cardiac medications before your next appointment, please call your pharmacy.   Call the [place] office at [phone] if you have any questions, problems or concerns.

## 2016-07-03 ENCOUNTER — Encounter: Payer: Self-pay | Admitting: Nurse Practitioner

## 2016-07-03 LAB — HEPATIC FUNCTION PANEL
ALT: 45 U/L (ref 9–46)
AST: 50 U/L — ABNORMAL HIGH (ref 10–35)
Albumin: 3.3 g/dL — ABNORMAL LOW (ref 3.6–5.1)
Alkaline Phosphatase: 67 U/L (ref 40–115)
Bilirubin, Direct: 0.3 mg/dL — ABNORMAL HIGH (ref <=0.2)
Indirect Bilirubin: 0.8 mg/dL (ref 0.2–1.2)
Total Bilirubin: 1.1 mg/dL (ref 0.2–1.2)
Total Protein: 6.4 g/dL (ref 6.1–8.1)

## 2016-07-03 LAB — CBC
HCT: 45.9 % (ref 38.5–50.0)
Hemoglobin: 15.4 g/dL (ref 13.2–17.1)
MCH: 32.7 pg (ref 27.0–33.0)
MCHC: 33.6 g/dL (ref 32.0–36.0)
MCV: 97.5 fL (ref 80.0–100.0)
MPV: 12.3 fL (ref 7.5–12.5)
Platelets: 136 10*3/uL — ABNORMAL LOW (ref 140–400)
RBC: 4.71 MIL/uL (ref 4.20–5.80)
RDW: 15.2 % — ABNORMAL HIGH (ref 11.0–15.0)
WBC: 9.1 10*3/uL (ref 3.8–10.8)

## 2016-07-03 LAB — BASIC METABOLIC PANEL
BUN: 36 mg/dL — ABNORMAL HIGH (ref 7–25)
CO2: 30 mmol/L (ref 20–31)
Calcium: 8.7 mg/dL (ref 8.6–10.3)
Chloride: 96 mmol/L — ABNORMAL LOW (ref 98–110)
Creat: 1.31 mg/dL — ABNORMAL HIGH (ref 0.70–1.11)
Glucose, Bld: 80 mg/dL (ref 65–99)
Potassium: 4.3 mmol/L (ref 3.5–5.3)
Sodium: 136 mmol/L (ref 135–146)

## 2016-07-03 LAB — BRAIN NATRIURETIC PEPTIDE: Brain Natriuretic Peptide: 360.9 pg/mL — ABNORMAL HIGH (ref <100)

## 2016-07-03 NOTE — Telephone Encounter (Signed)
New message    He said he spoke to Dr.Meeks last night and he has not heard from [place]. Pt believes its because he gave Dr.Cooper an incorrect number   The new number [phone]

## 2016-07-03 NOTE — Telephone Encounter (Signed)
Left message on machine for pt to contact the office in regards to labs.

## 2016-07-03 NOTE — Telephone Encounter (Signed)
Follow up ° ° ° ° ° °Calling to get lab results °

## 2016-07-04 ENCOUNTER — Ambulatory Visit
Admission: RE | Admit: 2016-07-04 | Discharge: 2016-07-04 | Disposition: A | Discharge status: Home / Self Care | Payer: Medicare Other | Source: Ambulatory Visit | Attending: Nurse Practitioner | Admitting: Nurse Practitioner

## 2016-07-04 ENCOUNTER — Encounter: Payer: Self-pay | Admitting: Cardiovascular Disease

## 2016-07-04 DIAGNOSIS — I5032 Chronic diastolic (congestive) heart failure: Secondary | ICD-10-CM

## 2016-07-04 DIAGNOSIS — J90 Pleural effusion, not elsewhere classified: Secondary | ICD-10-CM

## 2016-07-04 NOTE — Telephone Encounter (Signed)
New message     Pt wife is calling about test results. Please call.

## 2016-07-04 NOTE — Telephone Encounter (Signed)
I spoke with the pt's wife and made her aware of lab and chest x-ray results. I made her aware that the pt needs to decrease Lasix to every other day, continue salt restriction and weigh daily. The pt will also have repeat chest x-ray on 07/17/16.

## 2016-07-04 NOTE — Telephone Encounter (Signed)
Left message on machine for pt to contact the office to discuss lab and chest x-ray results.

## 2016-07-04 NOTE — Telephone Encounter (Signed)
New message   Pt verbalized that he is calling for his lab results and if he is not home to give the information to his wife

## 2016-07-04 NOTE — Telephone Encounter (Signed)
This encounter was created in error - please disregard.

## 2016-07-08 ENCOUNTER — Ambulatory Visit (INDEPENDENT_AMBULATORY_CARE_PROVIDER_SITE_OTHER): Payer: Medicare Other | Admitting: Neurology

## 2016-07-08 ENCOUNTER — Encounter: Payer: Self-pay | Admitting: Neurology

## 2016-07-08 VITALS — BP 131/90 | HR 63 | Ht 74.0 in | Wt 192.8 lb

## 2016-07-08 DIAGNOSIS — I10 Essential (primary) hypertension: Secondary | ICD-10-CM | POA: Diagnosis not present

## 2016-07-08 DIAGNOSIS — I480 Paroxysmal atrial fibrillation: Secondary | ICD-10-CM

## 2016-07-08 DIAGNOSIS — G453 Amaurosis fugax: Secondary | ICD-10-CM | POA: Diagnosis not present

## 2016-07-08 DIAGNOSIS — I7121 Aneurysm of the ascending aorta, without rupture: Secondary | ICD-10-CM

## 2016-07-08 DIAGNOSIS — I714 Abdominal aortic aneurysm, without rupture, unspecified: Secondary | ICD-10-CM

## 2016-07-08 DIAGNOSIS — E785 Hyperlipidemia, unspecified: Secondary | ICD-10-CM | POA: Diagnosis not present

## 2016-07-08 DIAGNOSIS — Z7901 Long term (current) use of anticoagulants: Secondary | ICD-10-CM

## 2016-07-08 DIAGNOSIS — I712 Thoracic aortic aneurysm, without rupture: Secondary | ICD-10-CM | POA: Diagnosis not present

## 2016-07-08 DIAGNOSIS — I63412 Cerebral infarction due to embolism of left middle cerebral artery: Secondary | ICD-10-CM

## 2016-07-08 NOTE — Patient Instructions (Signed)
-   continue eliquis for stroke prevention - check BP at home - Follow up with primary care physician for stroke risk factor modification. Recommend maintain blood pressure goal <130/80, diabetes with hemoglobin A1c goal below 6.5% and lipids with LDL cholesterol goal below 70 mg/dL.  - will check CT angio neck to rule out carotid artery stenosis to account for left eye vision loss - will check CT angio aorta to monitor aortic aneurysm. - follow up with Dr. Burt Knack for CHF - salt free diet and home exercise - follow up in 3 months.

## 2016-07-08 NOTE — Progress Notes (Signed)
STROKE NEUROLOGY FOLLOW UP NOTE  NAME: Arthur [name] DOB: December 05, 1924  REASON FOR VISIT: stroke follow up HISTORY FROM: pt and chart  Today we had the pleasure of seeing Arthur [name] in follow-up at our [facility]rology [facility]. Pt was accompanied by no one.   History Summary Arthur [name] is a 82 y.o. male with history of CAD, HLD, afib on coumadin, TIA, HTN, and previous MI, was admitted on 01/31/16 for dysarthria and right face and arm weakness. His coumadin was on hold due to LE vascular procedure. He received IV TPA. MRI showed acute left MCA small infarcts and chronic b/l cerebellar infarcts. MRA head questionable right M2 branch occlusion and right PICA occlusion. Coumadin resumed. EEG and CUS unremarkable. TTE EF 55-60%, but concerning for dilated aortic root. Therefore CTA chest aorta performed and showed unchanged ascending aortic aneurysm, but also found to have small thrombus in the LAA. Therefore, coumadin was changed to eliquis (coumadin only had on dose). He was discharged with eliquis and zetia due to elevated LFTs.   03/21/16 follow up - the patient has been doing well. No recurrent stroke like symptoms. Only complains of easy fatigue. Denies s/s of OSA. Discontinued zetia by PCP. Currently on eliquis tolerating well. BP 108/60.    Interval History During the interval time, pt has been doing well. Had recent right cheek squamous cell carcinoma removal. No need to stop eliquis for skin procedure. Had recent CHF and on "fluid pills" and lost 9-10lbs. About 4 weeks ago, pt was driving and had sudden onset left vision loss, lasted about 30 sec and resolved. No weakness or numbness, no HA, or migraine Hx. BP 131/90. On eliquis without side effects.   REVIEW OF SYSTEMS: Full 14 system review of systems performed and notable only for those listed below and in HPI above, all others are negative:  Constitutional:   Cardiovascular:  Ear/Nose/Throat:   Skin:  Eyes:   Respiratory:     Gastroitestinal:   Genitourinary:  Hematology/Lymphatic:   Endocrine:  Musculoskeletal:   Allergy/Immunology:   Neurological:   Psychiatric:  Sleep:   The following represents the patient's updated allergies and side effects list: Allergies  Allergen Reactions  . Penicillins Hives    The neurologically relevant items on the patient's problem list were reviewed on today's visit.  Neurologic Examination  A problem focused neurological exam (12 or more points of the single system neurologic examination, vital signs counts as 1 point, cranial nerves count for 8 points) was performed.  Blood pressure 131/90, pulse 63, height 6\' 2"  (1.88 m), weight 192 lb 12.8 oz (87.5 kg).  General - Well nourished, well developed, in no apparent distress.  Ophthalmologic - Sharp disc margins OU. Fundi not visualized due to .  Cardiovascular - Regular rate and rhythm with no murmur, no afib rhythm.  Mental Status -  Level of arousal and orientation to time, place, and person were intact. Language including expression, naming, repetition, comprehension was assessed and found intact. Fund of Knowledge was assessed and was intact.  Cranial Nerves II - XII - II - Visual field intact OU. III, IV, VI - Extraocular movements intact. V - Facial sensation intact bilaterally. VII - Facial movement intact bilaterally. VIII - Hearing & vestibular intact bilaterally. X - Palate elevates symmetrically. XI - Chin turning & shoulder shrug intact bilaterally. XII - Tongue protrusion intact.  Motor Strength - The patient's strength was normal in all extremities and pronator drift was absent.  Bulk was normal and fasciculations were absent.   Motor Tone - Muscle tone was assessed at the neck and appendages and was normal.  Reflexes - The patient's reflexes were 1+ in all extremities and he had no pathological reflexes.  Sensory - Light touch, temperature/pinprick were assessed and were normal.     Coordination - The patient had normal movements in the hands with no ataxia or dysmetria.  Tremor was absent.  Gait and Station - The patient's transfers, posture, gait, station, and turns were observed as normal.   Data reviewed: I personally reviewed the images and agree with the radiology interpretations.  Ct Head Wo Contrast 01/31/2016  1. No acute findings. No intracranial hemorrhage or edema.  2. 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  3. Meningioma overlying the left occipital lobe, stable in size, now partially calcified.   EEG - This awake and drowsy EEG is within normal limits for age. A normal EEG does not exclude a clinical diagnosis of epilepsy. Clinical correlation is advised.  CUS - Bilateral: 1-39% ICA stenosis. Vertebral artery flow is antegrade.  2D echo - Left ventricle: The cavity size was normal. Wall thickness was  increased in a pattern of mild LVH. There was mild focal basal  hypertrophy of the septum. Systolic function was normal. The  estimated ejection fraction was in the range of 55% to 60%. Wall  motion was normal; there were no regional wall motion  abnormalities. - Aortic valve: Valve mobility was restricted. There was mild  stenosis. There was mild regurgitation. - Aortic root: The aortic root was mildly dilated. - Ascending aorta: The ascending aorta was mildly dilated. - Mitral valve: Calcified annulus. There was mild regurgitation. - Left atrium: The atrium was severely dilated. - Right atrium: The atrium was moderately dilated. - Pulmonary arteries: Systolic pressure was mildly increased. PA  peak pressure: 36 mm Hg (S). Impressions: - Normal LV systolic function; biatrial enlargement; calcified  aortic valve with mild AS (mean gradient 17 mmHg); mild AI; mild  MR; trace TR with mildly elevated pulmonary pressure; mildly  dilated aortic root, ascending aorta and aortic arch; suggest CTA    or MRA to further assess.  CT Angio Chest Aorta with and without CM 02/02/2016 1. Biatrial enlargement with a small thrombus in the left atrial appendage. 2. No increase in size of the previously seen ascending thoracic aortic aneurysm, currently measuring 4.5 cm in maximum diameter. Recommend semi-annual imaging followup by CTA or MRA and referral to cardiothoracic surgery if not already obtained. This recommendation follows 2010 ACCF/AHA/AATS/ACR/ASA/SCA/SCAI/SIR/STS/SVM Guidelines for the Diagnosis and Management of Patients With Thoracic Aortic Disease. Circulation. 2010; 121: [id number] 3. Small to moderate-sized right pleural effusion and small left pleural effusion. 4. Bilateral dependent atelectasis, greater on the right. 5. Dense atheromatous coronary artery calcifications.   MRI Head 02/02/2016 1. Patchy small volume acute ischemic nonhemorrhagic left MCA territory infarcts as above. 2. Remote bilateral cerebellar infarcts, right larger than left. 3. Moderate generalized age-related cerebral atrophy. 4. 2.1 cm left parietal convexity meningioma. No associated edema.  MRA Head  02/02/2016 1. No definite large or proximal arterial branch occlusion within the intracranial circulation. 2. Question proximal right M2 branch occlusion, not entirely certain given the absence of acute or chronic infarct within this territory. Finding may be related to motion artifact on this exam. 3. Probable proximal nonocclusive moderate M2 stenoses bilaterally. 4. Diminutive vertebrobasilar system with fetal origin of the right PCA. Left PCA also largely supplied  via a prominent left posterior communicating artery. 5. Nonvisualization of the right PICA, which may be occluded given the remote right cerebellar infarct.  Component     Latest Ref Rng 02/01/2016  Cholesterol     0 - 200 mg/dL 161  Triglycerides     <150 mg/dL 58  HDL Cholesterol     >40 mg/dL 46  Total CHOL/HDL Ratio      3.5  VLDL      0 - 40 mg/dL 12  LDL (calc)     0 - 99 mg/dL 103 (H)  Hemoglobin A1C     4.8 - 5.6 % 5.9 (H)  Mean Plasma Glucose      123    Assessment: As you may recall, he is a 82 y.o. Caucasian male with PMH of CAD, HLD, afib on coumadin, TIA, HTN, and previous MI who was admitted on 01/31/16 for dysarthria and right face and arm weakness. His coumadin was on hold due to LE vascular procedure. He received IV TPA. MRI showed acute left MCA small infarcts and chronic b/l cerebellar infarcts. MRA head questionable right M2 branch occlusion and right PICA occlusion. Coumadin resumed. EEG and CUS unremarkable. TTE EF 55-60%, but concerning for dilated aortic root. CTA chest aorta showed unchanged ascending aortic aneurysm, but also found to have small thrombus in the LAA. Therefore, coumadin was changed to eliquis (coumadin only had on dose). He was discharged with eliquis and zetia due to elevated LFTs. Later zetia was discontinued by PCP. Pt had left eye amaurosis fugax 4 weeks ago lasted 30 sec. Pt also had recent diastolic CHF and treated with diuretics.   Plan:  - continue eliquis for stroke prevention (last Cre 1.3) - check BP at home - Follow up with primary care physician for stroke risk factor modification. Recommend maintain blood pressure goal <130/80, diabetes with hemoglobin A1c goal below 6.5% and lipids with LDL cholesterol goal below 70 mg/dL.  - will check CT angio neck to rule out carotid artery stenosis to account for left amaurosis fugax - will check CT angio aorta to monitor ascending aortic aneurysm. - follow up with Dr. Burt Knack for CHF - salt free diet and home exercise - follow up in 3 months.  I spent more than 25 minutes of face to face time with the patient. Greater than 50% of time was spent in counseling and coordination of care. We discussed CTA neck for amaurosis fugax, and CTA chest for aortic aneurysm as will as compliance with eliquis.    Orders Placed This Encounter   Procedures  . CT ANGIO NECK W OR WO CONTRAST    Standing Status:   Future    Standing Expiration Date:   09/07/2017    Order Specific Question:   If indicated for the ordered procedure, I authorize the administration of contrast media per Radiology protocol    Answer:   Yes    Order Specific Question:   Reason for Exam (SYMPTOM  OR DIAGNOSIS REQUIRED)    Answer:   left amaurosis fugux    Order Specific Question:   Preferred imaging location?    Answer:   Internal  . CT ANGIO CHEST AORTA W/CM &/OR WO/CM    Standing Status:   Future    Standing Expiration Date:   09/07/2017    Order Specific Question:   If indicated for the ordered procedure, I authorize the administration of contrast media per Radiology protocol    Answer:   Yes  Order Specific Question:   Reason for Exam (SYMPTOM  OR DIAGNOSIS REQUIRED)    Answer:   acending aorta aneurysm follow up    Order Specific Question:   Preferred imaging location?    Answer:   Internal    Meds ordered this encounter  Medications  . DISCONTD: doxycycline (VIBRAMYCIN) 100 MG capsule    Patient Instructions  - continue eliquis for stroke prevention - check BP at home - Follow up with primary care physician for stroke risk factor modification. Recommend maintain blood pressure goal <130/80, diabetes with hemoglobin A1c goal below 6.5% and lipids with LDL cholesterol goal below 70 mg/dL.  - will check CT angio neck to rule out carotid artery stenosis to account for left eye vision loss - will check CT angio aorta to monitor aortic aneurysm. - follow up with Dr. Burt Knack for CHF - salt free diet and home exercise - follow up in 3 months.   Rosalin Hawking, MD PhD [facility] [address] [place], [place] [address] [phone]

## 2016-07-15 ENCOUNTER — Telehealth: Payer: Self-pay

## 2016-07-15 NOTE — Telephone Encounter (Signed)
Pt called back stating he doesn't want to do it at this time.

## 2016-07-15 NOTE — Telephone Encounter (Signed)
Call to Mr. Eimer  Had to leave a VM to reschedule his AWV that was originally set up with Dr. Jenny Reichmann on Oct 5th. Left message to see if he can reschedule for a wed in October or schedule in November.

## 2016-07-16 NOTE — Telephone Encounter (Signed)
Call to Mr. Guidone and was confused regarding AWV. He will see Dr. Jenny Reichmann on 10/5 and can  Re-schedule AWV after his apt.

## 2016-07-17 ENCOUNTER — Encounter: Payer: Self-pay | Admitting: Nurse Practitioner

## 2016-07-23 ENCOUNTER — Ambulatory Visit
Admission: RE | Admit: 2016-07-23 | Discharge: 2016-07-23 | Disposition: A | Discharge status: Home / Self Care | Payer: Medicare Other | Source: Ambulatory Visit | Attending: Neurology | Admitting: Neurology

## 2016-07-23 ENCOUNTER — Other Ambulatory Visit: Payer: Self-pay | Admitting: Nurse Practitioner

## 2016-07-23 DIAGNOSIS — J90 Pleural effusion, not elsewhere classified: Secondary | ICD-10-CM

## 2016-07-23 DIAGNOSIS — G453 Amaurosis fugax: Secondary | ICD-10-CM

## 2016-07-23 DIAGNOSIS — I714 Abdominal aortic aneurysm, without rupture, unspecified: Secondary | ICD-10-CM

## 2016-07-23 MED ORDER — IOPAMIDOL (ISOVUE-370) INJECTION 76%
100.0000 mL | Freq: Once | INTRAVENOUS | Status: AC | PRN
  Administered 2016-07-23: 100 mL via INTRAVENOUS

## 2016-08-04 ENCOUNTER — Encounter: Payer: Self-pay | Admitting: Nurse Practitioner

## 2016-08-04 ENCOUNTER — Ambulatory Visit (INDEPENDENT_AMBULATORY_CARE_PROVIDER_SITE_OTHER): Payer: Medicare Other | Admitting: Nurse Practitioner

## 2016-08-04 VITALS — BP 124/82 | HR 77 | Ht 74.0 in | Wt 192.1 lb

## 2016-08-04 DIAGNOSIS — I482 Chronic atrial fibrillation, unspecified: Secondary | ICD-10-CM

## 2016-08-04 DIAGNOSIS — I259 Chronic ischemic heart disease, unspecified: Secondary | ICD-10-CM

## 2016-08-04 DIAGNOSIS — J90 Pleural effusion, not elsewhere classified: Secondary | ICD-10-CM | POA: Diagnosis not present

## 2016-08-04 DIAGNOSIS — I5032 Chronic diastolic (congestive) heart failure: Secondary | ICD-10-CM

## 2016-08-04 LAB — BASIC METABOLIC PANEL
BUN: 27 mg/dL — ABNORMAL HIGH (ref 7–25)
CO2: 26 mmol/L (ref 20–31)
Calcium: 8.9 mg/dL (ref 8.6–10.3)
Chloride: 103 mmol/L (ref 98–110)
Creat: 1.35 mg/dL — ABNORMAL HIGH (ref 0.70–1.11)
Glucose, Bld: 114 mg/dL — ABNORMAL HIGH (ref 65–99)
Potassium: 4.4 mmol/L (ref 3.5–5.3)
Sodium: 140 mmol/L (ref 135–146)

## 2016-08-04 NOTE — Patient Instructions (Addendum)
We will be checking the following labs today - BMET   Medication Instructions:    Continue with your current medicines.     Testing/Procedures To Be Arranged:  N/A  Follow-Up:   See Dr. Burt Knack in 3 months.     Other Special Instructions:   Weigh each day  Continue to restrict your salt    If you need a refill on your cardiac medications before your next appointment, please call your pharmacy.   Call the [place] office at [phone] if you have any questions, problems or concerns.

## 2016-08-04 NOTE — Progress Notes (Signed)
CARDIOLOGY OFFICE NOTE  Date:  08/04/2016    Arthur [name] Date of Birth: 29-Dec-1924 Medical Record [id number]  PCP:  [name], MD  Cardiologist:  Jerel Shepherd    Chief Complaint  Patient presents with  . Follow-up    1 month check - follow up effusion - seen for Dr. Burt Knack    History of Present Illness: Arthur [name] is a 82 y.o. male who presents today for a follow up visit. This is a one month check. Seen for Dr. Burt Knack.   He has a history of chronic atrial fib, HLD, HTN, prior TIA, coronary artery disease with history of anteroseptal and inferior wall MIs in the 1990s, and previous stenting of the LAD in 2000. He remains on chronic coumadin therapy. He has had issues with varicose veins - followed by Dr. Kellie Simmering. Chronically elevated LFTs - not on statin therapy. Has had melanoma - sees Dr. Alen Blew. He is treated for autoimmune hepatitis.   Last seen in November by Dr. Burt Knack.   Admitted back in December with a right lung consolidation/pneumonia. BNP was elevated at that time at 700.   I last saw him back at the end of March - had had a recent vein procedure - had stopped his coumadin for 5 days and was not bridged - had had 2 doses post op and then suffered a stroke - got admitted - he was treated with TPA - recovered nicely with no deficit. Coumadin was changed to Eliquis. He had a CT angio of the chest showing a small to moderate right effusion at that time.   I saw him a month ago - he had been admitted to [facility] for 2 days with ?CHF - had thoracentesis and lots of testing.   [place]. BNP was elevated at almost 1300. Echo basically unchanged from ours - still with mild AS. Normal EF. Had thoracentesis of 1400 cc of clear fluid - analysis with 467 white cells, 11% polys and 97% mononuclear cells. Indeterminate diastolic function on echo due to the AF. CXR with a right lung  opacification. Did not look like he got any antibiotics. Cardiac cath was recommended - he wanted to have this done here if needed. Discharged on low dose Lasix. Short acting Toprol changed to long acting XL - not really clear as to why.    At my visit - lots of questions - He was feeling better. Recent PET negative but did show effusions and atherosclerosis and cardiomegaly. We opted to continue with his current regimen. Follow his CXRs, etc. CXR 2 weeks ago was stable.   Comes back today. Here alone today. He seems to be doing well. Weight is down. Breathing is good. No swelling. No chest pain. Walking on his treadmill for about 15 minutes total daily. No falls. Restricting his salt and BP looks good.   Past Medical History:  Diagnosis Date  . Arrhythmia    1995  . Atrial fibrillation ([place])   . Coronary artery disease    MI, atherectomy 1993  . DDD (degenerative disc disease)   . Eczema   . Elevated LFTs   . History of gallstones   . History of transient ischemic attack (TIA) 2005   double vision  . Hyperlipidemia   . Hypertension   . Myocardial infarct ([place]) 12/98   inferior  . Pneumonia    long time  ago  . Skin cancer   . Stroke ([place])   . Varicose veins     Past Surgical History:  Procedure Laterality Date  . AMPUTATION Right 05/10/2013   Procedure: RIGHT SECOND TOE AMPUTATION;  Surgeon: Hessie Dibble, MD;  Location: [facility];  Service: Orthopedics;  Laterality: Right;  . APPENDECTOMY    . CARDIAC CATHETERIZATION  04/08/99   LAD 70% INTER 40-50% RCA 30-40%  . CORONARY ANGIOPLASTY  04/08/99   LAD  . CORONARY ANGIOPLASTY  10/17/97   RCA   . CORONARY ANGIOPLASTY WITH STENT PLACEMENT  04/08/99   LAD  . MELANOMA EXCISION  2013   lt arm  . MELANOMA EXCISION Left 08/23/2013   Procedure: MELANOMA EXCISION;  Surgeon: Pedro Earls, MD;  Location: [facility];  Service: General;  Laterality: Left;  . TONSILLECTOMY    . VIDEO BRONCHOSCOPY Bilateral 05/07/2015     Procedure: VIDEO BRONCHOSCOPY WITHOUT FLUORO;  Surgeon: Rigoberto Noel, MD;  Location: [facility];  Service: Cardiopulmonary;  Laterality: Bilateral;     Medications: Current Outpatient Prescriptions  Medication Sig Dispense Refill  . albuterol (PROVENTIL HFA;VENTOLIN HFA) 108 (90 BASE) MCG/ACT inhaler Inhale 2 puffs into the lungs every 6 (six) hours as needed for wheezing or shortness of breath. 1 Inhaler 2  . apixaban (ELIQUIS) 5 MG TABS tablet Take 1 tablet (5 mg total) by mouth 2 (two) times daily. 180 tablet 1  . furosemide (LASIX) 20 MG tablet Take 1 tablet (20 mg total) by mouth every other day. 90 tablet 3  . metoprolol succinate (TOPROL-XL) 25 MG 24 hr tablet Take 25 mg by mouth daily.    . predniSONE (DELTASONE) 10 MG tablet Take 1 tablet (10 mg total) by mouth daily. 100 tablet 3   No current facility-administered medications for this visit.     Allergies: Allergies  Allergen Reactions  . Penicillins Hives    Social History: The patient  reports that he quit smoking about 48 years ago. His smoking use included Cigars and Pipe. He quit after 7.00 years of use. He has never used smokeless tobacco. He reports that he drinks about 0.6 oz of alcohol per week . He reports that he does not use drugs.   Family History: The patient's family history includes Heart attack (age of onset: 93) in his mother; Pneumonia in his father.   Review of Systems: Please see the history of present illness.   Otherwise, the review of systems is positive for none.   All other systems are reviewed and negative.   Physical Exam: VS:  BP 124/82   Pulse 77   Ht 6\' 2"  (1.88 m)   Wt 192 lb 1.9 oz (87.1 kg)   SpO2 96%   BMI 24.67 kg/m  .  BMI Body mass index is 24.67 kg/m.  Wt Readings from Last 3 Encounters:  08/04/16 192 lb 1.9 oz (87.1 kg)  07/08/16 192 lb 12.8 oz (87.5 kg)  07/02/16 194 lb 6.4 oz (88.2 kg)    General: Elderly male who is alert and in no acute distress.   HEENT:  Normal.  Neck: Supple, no JVD, carotid bruits, or masses noted.  Cardiac: Irregular irregular rhythm. His rate is ok. He has an outflow murmur noted. No edema.  Respiratory:  Lungs are clear to auscultation bilaterally with normal work of breathing.  GI: Soft and nontender.  MS: No deformity or atrophy. Gait and ROM intact.  Skin: Warm and dry. Color is normal.  Neuro:  Strength and sensation are intact and no gross focal deficits noted.  Psych: Alert, appropriate and with normal affect.   LABORATORY DATA:  EKG:  EKG is not ordered today.  Lab Results  Component Value Date   WBC 9.1 07/02/2016   HGB 15.4 07/02/2016   HCT 45.9 07/02/2016   PLT 136 (L) 07/02/2016   GLUCOSE 80 07/02/2016   CHOL 161 02/01/2016   TRIG 58 02/01/2016   HDL 46 02/01/2016   LDLCALC 103 (H) 02/01/2016   ALT 45 07/02/2016   AST 50 (H) 07/02/2016   NA 136 07/02/2016   K 4.3 07/02/2016   CL 96 (L) 07/02/2016   CREATININE 1.31 (H) 07/02/2016   BUN 36 (H) 07/02/2016   CO2 30 07/02/2016   TSH 1.64 10/09/2009   PSA 5.69 (H) 10/09/2009   INR 1.65 (H) 02/04/2016   HGBA1C 5.9 (H) 02/01/2016    BNP (last 3 results)  Recent Labs  10/28/15 1105 07/02/16 1456  BNP 700.1* 360.9*    ProBNP (last 3 results) No results for input(s): PROBNP in the last 8760 hours.   Other Studies Reviewed Today:  CHEST  2 VIEW  COMPARISON:  Chest CT 02/02/2016  FINDINGS: Small right pleural effusion with right basilar atelectasis. Linear density at the left base, likely atelectasis. Mild cardiomegaly. No overt edema. No acute bony abnormality.  IMPRESSION: Small right pleural effusion. Bibasilar atelectasis, right greater than left.  Cardiomegaly.   Electronically Signed   By: Rolm Baptise M.D.   On: 07/04/2016 11:04  CTA NECK IMPRESSION: 1. Extensive atherosclerotic calcifications within the common carotid arteries bilaterally at both carotid bifurcations, and at the aortic arch without focal  stenosis. 2. Tortuosity of the cervical internal carotid arteries bilaterally without significant stenosis. 3. No acute or focal lesion to account for the patient's left-sided amaurosis fugax. 4. Multilevel spondylosis of the cervical spine.   Electronically Signed   By: San Morelle M.D.   On: 07/23/2016 19:48  CTA CHEST IMPRESSION: 1. Stable ascending thoracic aorta measuring 43 mm in maximum dimension. Recommend annual imaging followup by CTA or MRA. This recommendation follows 2010 ACCF/AHA/AATS/ACR/ASA/SCA/SCAI/SIR/STS/SVM Guidelines for the Diagnosis and Management of Patients with Thoracic Aortic Disease. Circulation. 2010; 121: [id number] 2. Coronary artery calcification and aortic atherosclerotic calcification. 3. Chronic RIGHT effusion with basilar atelectasis.   Electronically Signed   By: Suzy Bouchard M.D.   On: 07/23/2016 16:46   Echo Study Conclusions from 01/2016  - Left ventricle: The cavity size was normal. Wall thickness was increased in a pattern of mild LVH. There was mild focal basal hypertrophy of the septum. Systolic function was normal. The estimated ejection fraction was in the range of 55% to 60%. Wall motion was normal; there were no regional wall motion abnormalities. - Aortic valve: Valve mobility was restricted. There was mild stenosis. There was mild regurgitation. - Aortic root: The aortic root was mildly dilated. - Ascending aorta: The ascending aorta was mildly dilated. - Mitral valve: Calcified annulus. There was mild regurgitation. - Left atrium: The atrium was severely dilated. - Right atrium: The atrium was moderately dilated. - Pulmonary arteries: Systolic pressure was mildly increased. PA peak pressure: 36 mm Hg (S).  Impressions:  - Normal LV systolic function; biatrial enlargement; calcified aortic valve with mild AS (mean gradient 17 mmHg); mild AI; mild MR; trace TR with mildly elevated  pulmonary pressure; mildly dilated aortic root, ascending aorta and aortic arch; suggest CTA or MRA to further assess  Assessment/Plan:  1. Recent acute on chronic diastolic HF exacerbation - most likely multifactorial - continue with Lasix and recheck his labs today. Restrict salt. Daily weights.   2. Right lung opacification/effusion - s/p thoracentesis - CXR stable from 2 weeks ago. Looks ok clinically. Would hold on repeating for now.   3. Chronic atrial fib - managed with rate control and anticoagulation.   4. Chronic anticoagulation with Eliquis - no problems noted.  5. Chronic lower extremity edema - none on exam today.   6. CAD - no active symptoms. Would favor conservative management.   7. Mild AS - favor continued follow up for now. No indication for cardiac catheterization.   Current medicines are reviewed with the patient today.  The patient does not have concerns regarding medicines other than what has been noted above.  The following changes have been made:  See above.  Labs/ tests ordered today include:    Orders Placed This Encounter  Procedures  . Basic metabolic panel     Disposition:   He has also been seen today with Dr. Burt Knack. Plan as above. FU with Dr. Burt Knack in 3 months.   Patient is agreeable to this plan and will call if any problems develop in the interim.   Signed: [name], RN, ANP-C 08/04/2016 12:08 PM  [facility] [address] [place], [place]  [address] Phone: [phone] Fax: [phone]

## 2016-08-05 ENCOUNTER — Telehealth: Payer: Self-pay | Admitting: Nurse Practitioner

## 2016-08-05 NOTE — Telephone Encounter (Signed)
Follow Up:; ° ° °Returning your call. °

## 2016-08-05 NOTE — Telephone Encounter (Signed)
F/u ° ° ° ° ° °Pt returning nurse call.  °

## 2016-08-18 ENCOUNTER — Other Ambulatory Visit: Payer: Self-pay | Admitting: *Deleted

## 2016-08-18 ENCOUNTER — Other Ambulatory Visit: Payer: Self-pay | Admitting: Internal Medicine

## 2016-08-18 ENCOUNTER — Other Ambulatory Visit (INDEPENDENT_AMBULATORY_CARE_PROVIDER_SITE_OTHER): Payer: Medicare Other

## 2016-08-18 ENCOUNTER — Telehealth: Payer: Self-pay | Admitting: Internal Medicine

## 2016-08-18 DIAGNOSIS — R238 Other skin changes: Secondary | ICD-10-CM | POA: Diagnosis not present

## 2016-08-18 DIAGNOSIS — Z0000 Encounter for general adult medical examination without abnormal findings: Secondary | ICD-10-CM

## 2016-08-18 DIAGNOSIS — R233 Spontaneous ecchymoses: Secondary | ICD-10-CM

## 2016-08-18 DIAGNOSIS — R600 Localized edema: Secondary | ICD-10-CM

## 2016-08-18 LAB — CBC WITH DIFFERENTIAL/PLATELET
BASOS ABS: 0 10*3/uL (ref 0.0–0.1)
BASOS PCT: 0.4 % (ref 0.0–3.0)
EOS PCT: 0.7 % (ref 0.0–5.0)
Eosinophils Absolute: 0.1 10*3/uL (ref 0.0–0.7)
HEMATOCRIT: 45.1 % (ref 39.0–52.0)
Hemoglobin: 15 g/dL (ref 13.0–17.0)
LYMPHS ABS: 3.6 10*3/uL (ref 0.7–4.0)
LYMPHS PCT: 40.4 % (ref 12.0–46.0)
MCHC: 33.3 g/dL (ref 30.0–36.0)
MCV: 98.6 fl (ref 78.0–100.0)
MONOS PCT: 14.6 % — AB (ref 3.0–12.0)
Monocytes Absolute: 1.3 10*3/uL — ABNORMAL HIGH (ref 0.1–1.0)
NEUTROS ABS: 3.9 10*3/uL (ref 1.4–7.7)
NEUTROS PCT: 43.9 % (ref 43.0–77.0)
PLATELETS: 137 10*3/uL — AB (ref 150.0–400.0)
RBC: 4.57 Mil/uL (ref 4.22–5.81)
RDW: 16.8 % — AB (ref 11.5–15.5)
WBC: 8.9 10*3/uL (ref 4.0–10.5)

## 2016-08-18 LAB — URINALYSIS, ROUTINE W REFLEX MICROSCOPIC
BILIRUBIN URINE: NEGATIVE
Hgb urine dipstick: NEGATIVE
Ketones, ur: NEGATIVE
Leukocytes, UA: NEGATIVE
Nitrite: NEGATIVE
PH: 7 (ref 5.0–8.0)
RBC / HPF: NONE SEEN (ref 0–?)
Specific Gravity, Urine: 1.01 (ref 1.000–1.030)
TOTAL PROTEIN, URINE-UPE24: NEGATIVE
UROBILINOGEN UA: 0.2 (ref 0.0–1.0)
Urine Glucose: NEGATIVE
WBC UA: NONE SEEN (ref 0–?)

## 2016-08-18 LAB — LIPID PANEL
CHOL/HDL RATIO: 3
Cholesterol: 192 mg/dL (ref 0–200)
HDL: 58.1 mg/dL (ref >39.00)
LDL CALC: 115 mg/dL — AB (ref 0–99)
NonHDL: 133.52
TRIGLYCERIDES: 93 mg/dL (ref 0.0–149.0)
VLDL: 18.6 mg/dL (ref 0.0–40.0)

## 2016-08-18 LAB — HEPATIC FUNCTION PANEL
ALK PHOS: 66 U/L (ref 39–117)
ALT: 42 U/L (ref 0–53)
AST: 44 U/L — ABNORMAL HIGH (ref 0–37)
Albumin: 3.4 g/dL — ABNORMAL LOW (ref 3.5–5.2)
BILIRUBIN DIRECT: 0.2 mg/dL (ref 0.0–0.3)
BILIRUBIN TOTAL: 1 mg/dL (ref 0.2–1.2)
TOTAL PROTEIN: 6.7 g/dL (ref 6.0–8.3)

## 2016-08-18 LAB — TSH: TSH: 1.45 u[IU]/mL (ref 0.35–4.50)

## 2016-08-18 LAB — PROTIME-INR
INR: 1.4 ratio — ABNORMAL HIGH (ref 0.8–1.0)
PROTHROMBIN TIME: 14.8 s — AB (ref 9.6–13.1)

## 2016-08-18 MED ORDER — FUROSEMIDE 20 MG PO TABS: 20.0000 mg | ORAL_TABLET | ORAL | 3 refills | Status: DC

## 2016-08-18 NOTE — Telephone Encounter (Signed)
Discussed with pt that he is due for LFT's. Pt will come to lab to have this drawn.

## 2016-08-21 ENCOUNTER — Telehealth: Payer: Self-pay

## 2016-08-21 ENCOUNTER — Encounter: Payer: Self-pay | Admitting: Internal Medicine

## 2016-08-21 ENCOUNTER — Ambulatory Visit (INDEPENDENT_AMBULATORY_CARE_PROVIDER_SITE_OTHER): Payer: Medicare Other | Admitting: Internal Medicine

## 2016-08-21 VITALS — BP 136/74 | HR 76 | Temp 97.9°F | Resp 20 | Wt 193.5 lb

## 2016-08-21 DIAGNOSIS — R945 Abnormal results of liver function studies: Principal | ICD-10-CM

## 2016-08-21 DIAGNOSIS — R739 Hyperglycemia, unspecified: Secondary | ICD-10-CM

## 2016-08-21 DIAGNOSIS — I10 Essential (primary) hypertension: Secondary | ICD-10-CM

## 2016-08-21 DIAGNOSIS — Z0001 Encounter for general adult medical examination with abnormal findings: Secondary | ICD-10-CM | POA: Insufficient documentation

## 2016-08-21 DIAGNOSIS — N183 Chronic kidney disease, stage 3 unspecified: Secondary | ICD-10-CM

## 2016-08-21 DIAGNOSIS — R5383 Other fatigue: Secondary | ICD-10-CM

## 2016-08-21 DIAGNOSIS — E785 Hyperlipidemia, unspecified: Secondary | ICD-10-CM | POA: Diagnosis not present

## 2016-08-21 DIAGNOSIS — Z23 Encounter for immunization: Secondary | ICD-10-CM | POA: Diagnosis not present

## 2016-08-21 DIAGNOSIS — R7989 Other specified abnormal findings of blood chemistry: Secondary | ICD-10-CM

## 2016-08-21 NOTE — Telephone Encounter (Signed)
Pt had hepatic function panel drawn 08/18/16. Please review and advise.

## 2016-08-21 NOTE — Assessment & Plan Note (Signed)

## 2016-08-21 NOTE — Assessment & Plan Note (Signed)
stable overall by history and exam, recent data reviewed with pt, and pt to continue medical treatment as before,  to f/u any worsening symptoms or concerns BP Readings from Last 3 Encounters:  08/21/16 136/74  08/04/16 124/82  07/08/16 131/90

## 2016-08-21 NOTE — Progress Notes (Signed)
Pre visit review using our clinic review tool, if applicable. No additional management support is needed unless otherwise documented below in the visit note. 

## 2016-08-21 NOTE — Telephone Encounter (Signed)
-----   Message from Biagio Borg, MD sent at 08/21/2016  9:57 AM EDT ----- Regarding: recent LFT's Arthur [name]  Pt requests LFT's review by yourself and Dr Henrene Pastor; he is asking if needs to continue his 10 mg prednisone.   thanks

## 2016-08-21 NOTE — Assessment & Plan Note (Signed)
stable overall by history and exam, recent data reviewed with pt, and pt to continue medical treatment as before,  to f/u any worsening symptoms or concerns Lab Results  Component Value Date   CREATININE 1.35 (H) 08/04/2016

## 2016-08-21 NOTE — Assessment & Plan Note (Addendum)
Goal ldl < 70, but with hx of hepatic dz, will hold on statin tx for now,  to f/u any worsening symptoms or concerns  In addition to the time spent performing CPE, I spent an additional 15 minutes face to face,in which greater than 50% of this time was spent in counseling and coordination of care for patient's illness as documented.

## 2016-08-21 NOTE — Assessment & Plan Note (Signed)
Asympt, stable overall by history and exam, recent data reviewed with pt, and pt to continue medical treatment as before,  to f/u any worsening symptoms or concerns Lab Results  Component Value Date   HGBA1C 5.9 (H) 02/01/2016   For f/u lab

## 2016-08-21 NOTE — Progress Notes (Signed)
Subjective:    Patient ID: Arthur [name], male    DOB: [birth year], 82 y.o.   MRN: [id number]  HPI  Here for wellness and f/u;  Overall doing ok;  Pt denies Chest pain, worsening SOB, DOE, wheezing, orthopnea, PND, worsening LE edema, palpitations, dizziness or syncope.  Pt denies neurological change such as new headache, facial or extremity weakness.  Pt denies polydipsia, polyuria, or low sugar symptoms. Pt states overall good compliance with treatment and medications, good tolerability, and has been trying to follow appropriate diet.  Pt denies worsening depressive symptoms, suicidal ideation or panic. No fever, night sweats, wt loss, loss of appetite, or other constitutional symptoms.  Pt states good ability with ADL's, has low fall risk, home safety reviewed and adequate, no other significant changes in hearing or vision.   Has some bruising to arms on eliquis.  Remains on prednisone 10 qd per GI and hepatic. 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 Leg swelling much improved.  Does c/o ongoing fatigue, but denies signficant daytime hypersomnolence.   Wt Readings from Last 3 Encounters:  08/21/16 193 lb 8 oz (87.8 kg)  08/04/16 192 lb 1.9 oz (87.1 kg)  07/08/16 192 lb 12.8 oz (87.5 kg)   Past Medical History:  Diagnosis Date  . Arrhythmia    1995  . Atrial fibrillation ([place])   . Coronary artery disease    MI, atherectomy 1993  . DDD (degenerative disc disease)   . Eczema   . Elevated LFTs   . History of gallstones   . History of transient ischemic attack (TIA) 2005   double vision  . Hyperlipidemia   . Hypertension   . Myocardial infarct 12/98   inferior  . Pneumonia    long time ago  . Skin cancer   . Stroke ([place])   . Varicose veins    Past Surgical History:  Procedure Laterality Date  . AMPUTATION Right 05/10/2013   Procedure: RIGHT SECOND TOE  AMPUTATION;  Surgeon: Hessie Dibble, MD;  Location: [facility];  Service: Orthopedics;  Laterality: Right;  . APPENDECTOMY    . CARDIAC CATHETERIZATION  04/08/99   LAD 70% INTER 40-50% RCA 30-40%  . CORONARY ANGIOPLASTY  04/08/99   LAD  . CORONARY ANGIOPLASTY  10/17/97   RCA   . CORONARY ANGIOPLASTY WITH STENT PLACEMENT  04/08/99   LAD  . MELANOMA EXCISION  2013   lt arm  . MELANOMA EXCISION Left 08/23/2013   Procedure: MELANOMA EXCISION;  Surgeon: Pedro Earls, MD;  Location: [facility];  Service: General;  Laterality: Left;  . TONSILLECTOMY    . VIDEO BRONCHOSCOPY Bilateral 05/07/2015   Procedure: VIDEO BRONCHOSCOPY WITHOUT FLUORO;  Surgeon: Rigoberto Noel, MD;  Location: [facility];  Service: Cardiopulmonary;  Laterality: Bilateral;    reports that he quit smoking about 48 years ago. His smoking use included Cigars and Pipe. He quit after 7.00 years of use. He has never used smokeless tobacco. He reports that he drinks about 0.6 oz of alcohol per week . He reports that he does not use drugs. family history includes Heart attack (age of onset: 52) in his mother; Pneumonia in his father. Allergies  Allergen Reactions  . Penicillins Hives   Current Outpatient Prescriptions on File Prior to Visit  Medication Sig Dispense Refill  .  albuterol (PROVENTIL HFA;VENTOLIN HFA) 108 (90 BASE) MCG/ACT inhaler Inhale 2 puffs into the lungs every 6 (six) hours as needed for wheezing or shortness of breath. 1 Inhaler 2  . apixaban (ELIQUIS) 5 MG TABS tablet Take 1 tablet (5 mg total) by mouth 2 (two) times daily. 180 tablet 1  . furosemide (LASIX) 20 MG tablet Take 1 tablet (20 mg total) by mouth every other day. 45 tablet 3  . metoprolol succinate (TOPROL-XL) 25 MG 24 hr tablet Take 25 mg by mouth daily.    . predniSONE (DELTASONE) 10 MG tablet Take 1 tablet (10 mg total) by mouth daily. 100 tablet 3   No current facility-administered medications on file prior to visit.    Review  of Systems Constitutional: Negative for increased diaphoresis, or other activity, appetite or siginficant weight change other than noted HENT: Negative for worsening hearing loss, ear pain, facial swelling, mouth sores and neck stiffness.   Eyes: Negative for other worsening pain, redness or visual disturbance.  Respiratory: Negative for choking or stridor Cardiovascular: Negative for other chest pain and palpitations.  Gastrointestinal: Negative for worsening diarrhea, blood in stool, or abdominal distention Genitourinary: Negative for hematuria, flank pain or change in urine volume.  Musculoskeletal: Negative for myalgias or other joint complaints.  Skin: Negative for other color change and wound or drainage.  Neurological: Negative for syncope and numbness. other than noted Hematological: Negative for adenopathy. or other swelling Psychiatric/Behavioral: Negative for hallucinations, SI, self-injury, decreased concentration or other worsening agitation.      Objective:   Physical Exam BP 136/74   Pulse 76   Temp 97.9 F (36.6 C) (Oral)   Resp 20   Wt 193 lb 8 oz (87.8 kg)   SpO2 96%   BMI 24.84 kg/m  VS noted,  Constitutional: Pt is oriented to person, place, and time. Appears well-developed and well-nourished, in no significant distress Head: Normocephalic and atraumatic  Eyes: Conjunctivae and EOM are normal. Pupils are equal, round, and reactive to light Right Ear: External ear normal.  Left Ear: External ear normal Nose: Nose normal.  Mouth/Throat: Oropharynx is clear and moist  Neck: Normal range of motion. Neck supple. No JVD present. No tracheal deviation present or significant neck LA or mass Cardiovascular: Normal rate, irregular rhythm, normal heart sounds and intact distal pulses.   Pulmonary/Chest: Effort normal and breath sounds decreasede right base but without rales or wheezing  Abdominal: Soft. Bowel sounds are normal. NT. No HSM  Musculoskeletal: Normal range  of motion. Exhibits no edema Lymphadenopathy: Has no cervical adenopathy.  Neurological: Pt is alert and oriented to person, place, and time. Pt has normal reflexes. No cranial nerve deficit. Motor grossly intact Skin: Skin is warm and dry. No rash noted or new ulcers Psychiatric:  Has ? dysphoric mood and affect. Behavior is normal.   Lab Results  Component Value Date   WBC 8.9 08/18/2016   HGB 15.0 08/18/2016   HCT 45.1 08/18/2016   PLT 137.0 (L) 08/18/2016   GLUCOSE 114 (H) 08/04/2016   CHOL 192 08/18/2016   TRIG 93.0 08/18/2016   HDL 58.10 08/18/2016   LDLCALC 115 (H) 08/18/2016   ALT 42 08/18/2016   AST 44 (H) 08/18/2016   NA 140 08/04/2016   K 4.4 08/04/2016   CL 103 08/04/2016   CREATININE 1.35 (H) 08/04/2016   BUN 27 (H) 08/04/2016   CO2 26 08/04/2016   TSH 1.45 08/18/2016   PSA 5.69 (H) 10/09/2009   INR 1.4 (  H) 08/18/2016   HGBA1C 5.9 (H) 02/01/2016       Assessment & Plan:

## 2016-08-21 NOTE — Patient Instructions (Addendum)
You had the flu shot today  Please continue all other medications as before, and refills have been done if requested.  Please have the pharmacy call with any other refills you may need.  Please continue your efforts at being more active, low cholesterol diet, and weight control.  You are otherwise up to date with prevention measures today.  Please keep your appointments with your specialists as you may have planned  Please return in 6 months, or sooner if needed, with Lab testing done 3-5 days before  

## 2016-08-22 NOTE — Telephone Encounter (Signed)
Left message for pt to call back  °

## 2016-08-22 NOTE — Telephone Encounter (Signed)
Reviewed. Let pt know that lft's look a little better. Let's decrease prednisone to 5 mg daily and repeat lft's in 2 months

## 2016-08-25 NOTE — Telephone Encounter (Signed)
Spoke with pt and he is aware. Order and reminder in epic.

## 2016-09-02 ENCOUNTER — Ambulatory Visit (INDEPENDENT_AMBULATORY_CARE_PROVIDER_SITE_OTHER): Payer: Medicare Other | Admitting: Pharmacist Clinician (PhC)/ Clinical Pharmacy Specialist

## 2016-09-02 DIAGNOSIS — I482 Chronic atrial fibrillation: Secondary | ICD-10-CM | POA: Diagnosis not present

## 2016-09-02 NOTE — Progress Notes (Signed)
Pt was started on Eliquis 5 mg bid for atrial fibrillation on March 20,2017.  Patient had CBC drawn in last 2 weeks and BMET in past 30 days.  Will not repeat labs today.  Reviewed patients medication list.  Pt is not currently on any combined P-gp and strong CYP3A4 inhibitors/inducers (ketoconazole, traconazole, ritonavir, carbamazepine, phenytoin, rifampin, St. [name]'s wort).  Reviewed labs.  SCr 1.35, Weight 193.5, CrCl- 46.05.  Dose appropriate based on age, weight, and SCr.  Hgb and HCT Within Normal Limits  A full discussion of the nature of anticoagulants has been carried out.  A benefit/risk analysis has been presented to the patient, so that they understand the justification for choosing anticoagulation with Eliquis at this time.  The need for compliance is stressed.  Pt is aware to take the medication twice daily.  Side effects of potential bleeding are discussed, including unusual colored urine or stools, coughing up blood or coffee ground emesis, nose bleeds or serious fall or head trauma.  Discussed signs and symptoms of stroke. The patient should avoid any OTC items containing aspirin or ibuprofen.  Avoid alcohol consumption.   Call if any signs of abnormal bleeding.  Discussed financial obligations and resolved any difficulty in obtaining medication.  Next lab test test in 6 months.   Patient admits to problems with remembering to take BID medication, and admits to missing evening dose up to once weekly.  Had discussion about switch to Xarelto.  He currently has 2-3 month supply of Eliquis at home, so will continue with that for now.  Advised that if he feels missed doses will continue to be a problem, would recommend switch to Xarelto (dose would be 15 mg based on current labs).  He knows to call should he wish to make that switch.

## 2016-09-02 NOTE — Patient Instructions (Signed)
Continue with your Eliquis twice daily.  Eliquis does not need to be taken with food.  If you decide that taking a twice daily medication is not going to work for you, we can switch to Xarelto once daily (with food).    If you decide to switch or need to call and re-schedule your appt. Please call [phone]

## 2016-09-05 ENCOUNTER — Ambulatory Visit (INDEPENDENT_AMBULATORY_CARE_PROVIDER_SITE_OTHER): Payer: Medicare Other | Admitting: Adult Health

## 2016-09-05 ENCOUNTER — Encounter: Payer: Self-pay | Admitting: Adult Health

## 2016-09-05 ENCOUNTER — Other Ambulatory Visit: Payer: Self-pay | Admitting: Internal Medicine

## 2016-09-05 ENCOUNTER — Ambulatory Visit (INDEPENDENT_AMBULATORY_CARE_PROVIDER_SITE_OTHER)
Admission: RE | Admit: 2016-09-05 | Discharge: 2016-09-05 | Disposition: A | Discharge status: Home / Self Care | Payer: Medicare Other | Source: Ambulatory Visit | Attending: Adult Health | Admitting: Adult Health

## 2016-09-05 VITALS — BP 126/70 | Temp 98.6°F | Ht 74.0 in | Wt 194.6 lb

## 2016-09-05 DIAGNOSIS — M25512 Pain in left shoulder: Secondary | ICD-10-CM | POA: Diagnosis not present

## 2016-09-05 MED ORDER — CYCLOBENZAPRINE HCL 10 MG PO TABS: 10.0000 mg | ORAL_TABLET | Freq: Every day | ORAL | 0 refills | Status: DC

## 2016-09-05 NOTE — Progress Notes (Signed)
Subjective:    Patient ID: Arthur [name], male    DOB: [birth year], 82 y.o.   MRN: [id number]  HPI  This is an 82 year old male. Who  has a past medical history of Arrhythmia; Atrial fibrillation ([place]); Coronary artery disease; DDD (degenerative disc disease); Eczema; Elevated LFTs; History of gallstones; History of transient ischemic attack (TIA) (2005); Hyperlipidemia; Hypertension; Myocardial infarct (12/98); Pneumonia; Skin cancer; Stroke [facility]); and Varicose veins. Resents to the office today with the chief complaint of left arm pain. He reports that the arm pain started approximately 24 hours ago" I think I pulled a muscle in my left arm either doing pushups or gardening". He reports a dull pain that radiates from the shoulder down into the forearm. Reports limited range of motion when he tries to lift his arm the pain becomes or severe. Then using a heating pad and topical sports creams without much relief.  He denies any trauma   Review of Systems  Constitutional: Positive for activity change.  Respiratory: Negative.   Cardiovascular: Negative.   Musculoskeletal: Positive for myalgias. Negative for joint swelling.  Skin: Negative.   All other systems reviewed and are negative.  Past Medical History:  Diagnosis Date  . Arrhythmia    1995  . Atrial fibrillation ([place])   . Coronary artery disease    MI, atherectomy 1993  . DDD (degenerative disc disease)   . Eczema   . Elevated LFTs   . History of gallstones   . History of transient ischemic attack (TIA) 2005   double vision  . Hyperlipidemia   . Hypertension   . Myocardial infarct 12/98   inferior  . Pneumonia    long time ago  . Skin cancer   . Stroke ([place])   . Varicose veins     Social History   Social History  . Marital status: Married    Spouse name: N/A  . Number of children: N/A  . Years of education: N/A   Occupational History  . Not on file.   Social History Main Topics  . Smoking status: Former  Smoker    Years: 7.00    Types: Cigars, Pipe    Quit date: 11/18/1967  . Smokeless tobacco: Never Used     Comment: 1-2 cigars a day, pipe  . Alcohol use 0.6 oz/week    1 Glasses of wine per week     Comment: 2-3 glasses of wine in the evening  . Drug use: No  . Sexual activity: Not Currently   Other Topics Concern  . Not on file   Social History Narrative  . No narrative on file    Past Surgical History:  Procedure Laterality Date  . AMPUTATION Right 05/10/2013   Procedure: RIGHT SECOND TOE AMPUTATION;  Surgeon: Hessie Dibble, MD;  Location: [facility];  Service: Orthopedics;  Laterality: Right;  . APPENDECTOMY    . CARDIAC CATHETERIZATION  04/08/99   LAD 70% INTER 40-50% RCA 30-40%  . CORONARY ANGIOPLASTY  04/08/99   LAD  . CORONARY ANGIOPLASTY  10/17/97   RCA   . CORONARY ANGIOPLASTY WITH STENT PLACEMENT  04/08/99   LAD  . MELANOMA EXCISION  2013   lt arm  . MELANOMA EXCISION Left 08/23/2013   Procedure: MELANOMA EXCISION;  Surgeon: Pedro Earls, MD;  [facility];  Service: General;  Laterality: Left;  . TONSILLECTOMY    . VIDEO BRONCHOSCOPY Bilateral 05/07/2015   Procedure: VIDEO BRONCHOSCOPY WITHOUT FLUORO;  Surgeon: Rigoberto Noel, MD;  Location: [facility];  Service: Cardiopulmonary;  Laterality: Bilateral;    Family History  Problem Relation Age of Onset  . Heart attack Mother 66    MI  . Pneumonia Father     Allergies  Allergen Reactions  . Penicillins Hives    Current Outpatient Prescriptions on File Prior to Visit  Medication Sig Dispense Refill  . albuterol (PROVENTIL HFA;VENTOLIN HFA) 108 (90 BASE) MCG/ACT inhaler Inhale 2 puffs into the lungs every 6 (six) hours as needed for wheezing or shortness of breath. 1 Inhaler 2  . ELIQUIS 5 MG TABS tablet TAKE 1 TABLET TWICE A DAY 180 tablet 1  . furosemide (LASIX) 20 MG tablet Take 1 tablet (20 mg total) by mouth every other day. 45 tablet 3  . metoprolol succinate (TOPROL-XL) 25  MG 24 hr tablet Take 25 mg by mouth daily.    . predniSONE (DELTASONE) 10 MG tablet Take 1 tablet (10 mg total) by mouth daily. (Patient taking differently: Take 5 mg by mouth daily. ) 100 tablet 3   No current facility-administered medications on file prior to visit.     BP 126/70   Temp 98.6 F (37 C) (Oral)   Ht 6\' 2"  (1.88 m)   Wt 194 lb 9.6 oz (88.3 kg)   BMI 24.99 kg/m       Objective:   Physical Exam  Constitutional: He is oriented to person, place, and time. He appears well-developed and well-nourished. No distress.  Cardiovascular: Normal rate, regular rhythm, normal heart sounds and intact distal pulses.  Exam reveals no gallop and no friction rub.   No murmur heard. Pulmonary/Chest: Effort normal and breath sounds normal. No respiratory distress. He has no wheezes. He has no rales. He exhibits no tenderness.  Musculoskeletal: He exhibits tenderness. He exhibits no deformity.  Patient is unable to lift left arm away from his body. He has severe painwhile trying to do so. Has tenderness in the deltoid and forearm  Neurological: He is alert and oriented to person, place, and time.  Skin: Skin is warm and dry. No rash noted. He is not diaphoretic. No erythema. No pallor.  Psychiatric: He has a normal mood and affect. His behavior is normal. Judgment and thought content normal.  Nursing note and vitals reviewed.     Assessment & Plan:  1. Acute pain of left shoulder - There is concern for rotator cuff injury due to significant loss of range of motion.  - DG Shoulder Left; Future - cyclobenzaprine (FLEXERIL) 10 MG tablet; Take 1 tablet (10 mg total) by mouth at bedtime.  Dispense: 30 tablet; Refill: 0 - Ambulatory referral to Sports Medicine - Is placed in the arm sling and reported relief when doing so - Advised that he could take Tylenol 500 mg every 8 hours as needed for pain  - Ice area for 20 minutes at a time - Follow-up if no improvement  Dorothyann Peng, NP

## 2016-09-05 NOTE — Patient Instructions (Signed)
It was great seeing you today  I have sent in a prescription for Flexeril, this is a muscle relaxer. Please take at night as it will make you sleepy.   Please go to the [facility] to have your shoulder x ray   You can take tylenol 500mg  every 8 hours as needed for pain   Follow up with PCP as needed

## 2016-09-08 ENCOUNTER — Telehealth: Payer: Self-pay | Admitting: Internal Medicine

## 2016-09-08 NOTE — Telephone Encounter (Signed)
Pt saw cory on 09-05-16 and would like a callback concerning his shoulder

## 2016-09-09 ENCOUNTER — Other Ambulatory Visit: Payer: Self-pay | Admitting: Adult Health

## 2016-09-09 ENCOUNTER — Telehealth: Payer: Self-pay

## 2016-09-09 MED ORDER — TIZANIDINE HCL 4 MG PO TABS: 4.0000 mg | ORAL_TABLET | Freq: Four times a day (QID) | ORAL | 0 refills | Status: DC | PRN

## 2016-09-09 NOTE — Telephone Encounter (Signed)
I can send in Naprosyn if he would like. It looks like he refused to go to sports medicine because he was not having shoulder pain anymore. Would he like to be referred back? He should also follow up with his PCP

## 2016-09-09 NOTE — Telephone Encounter (Signed)
Received PA request for Cyclobenzaprine from [facility]. PA submitted & pending. Key: [id number]

## 2016-09-09 NOTE — Telephone Encounter (Signed)
Pt calling to check the status of the call from [facility] concerning this shoulder pain.

## 2016-09-09 NOTE — Telephone Encounter (Signed)
I sent in Zanaflex 

## 2016-09-09 NOTE — Telephone Encounter (Signed)
I called and left him a message on Friday of last week. He has no fracture or dislocation. Sports Medicine should be calling him.

## 2016-09-09 NOTE — Telephone Encounter (Signed)
Is there alternative that can be prescribed?

## 2016-09-09 NOTE — Telephone Encounter (Signed)
PA denied because medication is a benefit exclusion.

## 2016-09-09 NOTE — Telephone Encounter (Signed)
Please advise 

## 2016-09-09 NOTE — Telephone Encounter (Signed)
Patient notified.  Patient & patient's wife state that he had taken Zanaflex today, and he got very confused and "loopy" - he states he did not like taking that medication & would not take that anymore.  Patient would like to know what other medications he can take for pain, as it has not gotten any better.

## 2016-09-10 NOTE — Telephone Encounter (Signed)
I was able to get in contact with patient's wife - she states she is unsure if he would need the Naprosyn now because he is feeling a little better. She also states that she is unsure if patient wants to be referred back to Sports Medicine. She said she would have patient call me when he get's home and will discuss options. Thanks.

## 2016-09-11 ENCOUNTER — Telehealth: Payer: Self-pay | Admitting: *Deleted

## 2016-09-11 MED ORDER — APIXABAN 5 MG PO TABS: 5.0000 mg | ORAL_TABLET | Freq: Two times a day (BID) | ORAL | 2 refills | Status: DC

## 2016-09-11 MED ORDER — RIVAROXABAN 20 MG PO TABS: 20.0000 mg | ORAL_TABLET | Freq: Every day | ORAL | 3 refills | Status: DC

## 2016-09-11 NOTE — Telephone Encounter (Signed)
Can you clarify the name of the medication

## 2016-09-11 NOTE — Telephone Encounter (Signed)
Ok to change to xarelto 20 qd - rx done erx; we could consider pradaxa if this is not covered

## 2016-09-11 NOTE — Telephone Encounter (Signed)
I'm sorry dr. Jenny Reichmann it was the Eliquis 5mg ...Arthur [name]

## 2016-09-11 NOTE — Telephone Encounter (Signed)
Pt left msg on triage stating he received call from [facility]) med cost $580, and he can not afford to get. Requesting an alternative...Johny Chess

## 2016-09-11 NOTE — Telephone Encounter (Signed)
Called pt back gave him MD response. Pt states he does not want to take the xarelto med is to strong. Would rather stay on eliquis. [facility] is [facility]. Updated chart, cancel rx for xarelto...Arthur [name]

## 2016-09-13 ENCOUNTER — Other Ambulatory Visit: Payer: Self-pay | Admitting: Cardiovascular Disease

## 2016-10-13 ENCOUNTER — Encounter: Payer: Self-pay | Admitting: Neurology

## 2016-10-13 ENCOUNTER — Ambulatory Visit (INDEPENDENT_AMBULATORY_CARE_PROVIDER_SITE_OTHER): Payer: Medicare Other | Admitting: Neurology

## 2016-10-13 VITALS — BP 139/93 | HR 78 | Ht 74.0 in | Wt 190.8 lb

## 2016-10-13 DIAGNOSIS — I480 Paroxysmal atrial fibrillation: Secondary | ICD-10-CM | POA: Diagnosis not present

## 2016-10-13 DIAGNOSIS — I712 Thoracic aortic aneurysm, without rupture: Secondary | ICD-10-CM

## 2016-10-13 DIAGNOSIS — K754 Autoimmune hepatitis: Secondary | ICD-10-CM | POA: Diagnosis not present

## 2016-10-13 DIAGNOSIS — I63412 Cerebral infarction due to embolism of left middle cerebral artery: Secondary | ICD-10-CM | POA: Diagnosis not present

## 2016-10-13 DIAGNOSIS — I509 Heart failure, unspecified: Secondary | ICD-10-CM

## 2016-10-13 DIAGNOSIS — Z7901 Long term (current) use of anticoagulants: Secondary | ICD-10-CM

## 2016-10-13 DIAGNOSIS — I7121 Aneurysm of the ascending aorta, without rupture: Secondary | ICD-10-CM

## 2016-10-13 DIAGNOSIS — E785 Hyperlipidemia, unspecified: Secondary | ICD-10-CM | POA: Diagnosis not present

## 2016-10-13 HISTORY — DX: Heart failure, unspecified: I50.9

## 2016-10-13 NOTE — Progress Notes (Signed)
STROKE NEUROLOGY FOLLOW UP NOTE  NAME: Arthur [name] DOB: 02-Jun-1925  REASON FOR VISIT: stroke follow up HISTORY FROM: pt and chart  Today we had the pleasure of seeing Arthur [name] in follow-up at our Neurology Clinic. Pt was accompanied by no one.   History Summary [name] is a 82 y.o. male with history of CAD, HLD, afib on coumadin, TIA, HTN, and previous MI, was admitted on 01/31/16 for dysarthria and right face and arm weakness. His coumadin was on hold due to LE vascular procedure. He received IV TPA. MRI showed acute left MCA small infarcts and chronic b/l cerebellar infarcts. MRA head questionable right M2 branch occlusion and right PICA occlusion. Coumadin resumed. EEG and CUS unremarkable. TTE EF 55-60%, but concerning for dilated aortic root. Therefore CTA chest aorta performed and showed unchanged ascending aortic aneurysm, but also found to have small thrombus in the LAA. Therefore, coumadin was changed to eliquis (coumadin only had on dose). He was discharged with eliquis and zetia due to elevated LFTs.   03/21/16 follow up - the patient has been doing well. No recurrent stroke like symptoms. Only complains of easy fatigue. Denies s/s of OSA. Discontinued zetia by PCP. Currently on eliquis tolerating well. BP 108/60.    07/08/16 follow up - pt has been doing well. Had recent right cheek squamous cell carcinoma removal. No need to stop eliquis for skin procedure. Had recent CHF and on "fluid pills" and lost 9-10lbs. About 4 weeks ago, pt was driving and had sudden onset left vision loss, lasted about 30 sec and resolved. No weakness or numbness, no HA, or migraine Hx. BP 131/90. On eliquis without side effects.   Interval History During the interval time, pt has been doing well from stroke standpoint. No more amaurosis fugax episode. Had CTA neck in 07/2016 showed no significant ICA stenosis to explain her amaurosis fugax in 05/2016. Her repeat CTA chest also showed  resolution of LAA thrombus. He had SOB while having vacation in [place], [place] and was put on lasix and s/p thoracentesis. He followed with cardiology in 07/2017 and continued on lasix and beta blocker. He still complains of tiredness daily. During visit, he has mild SOB. BP today 139/93   REVIEW OF SYSTEMS: Full 14 system review of systems performed and notable only for those listed below and in HPI above, all others are negative:  Constitutional:   Cardiovascular:  Ear/Nose/Throat:   Skin:  Eyes:  Eye discharge Respiratory:  SOB Gastroitestinal:   Genitourinary: frequent urination Hematology/Lymphatic:   Endocrine:  Musculoskeletal:  Joint pain, back pain, aching muscles Allergy/Immunology:   Neurological:   Psychiatric:  Sleep:   The following represents the patient's updated allergies and side effects list: Allergies  Allergen Reactions  . Penicillins Hives    The neurologically relevant items on the patient's problem list were reviewed on today's visit.  Neurologic Examination  A problem focused neurological exam (12 or more points of the single system neurologic examination, vital signs counts as 1 point, cranial nerves count for 8 points) was performed.  Blood pressure (!) 139/93, pulse 78, height 6\' 2"  (1.88 m), weight 190 lb 12.8 oz (86.5 kg).  General - Well nourished, well developed, in no apparent distress.  Ophthalmologic - Sharp disc margins OU. Fundi not visualized due to .  Cardiovascular - Regular rate and rhythm with no murmur, no afib rhythm. Mild SOB and tachypnea.   Mental Status -  Level of arousal  and orientation to time, place, and person were intact. Language including expression, naming, repetition, comprehension was assessed and found intact. Fund of Knowledge was assessed and was intact.  Cranial Nerves II - XII - II - Visual field intact OU. III, IV, VI - Extraocular movements intact. V - Facial sensation intact bilaterally. VII - Facial  movement intact bilaterally. VIII - Hearing & vestibular intact bilaterally. X - Palate elevates symmetrically. XI - Chin turning & shoulder shrug intact bilaterally. XII - Tongue protrusion intact.  Motor Strength - The patient's strength was normal in all extremities and pronator drift was absent.  Bulk was normal and fasciculations were absent.   Motor Tone - Muscle tone was assessed at the neck and appendages and was normal.  Reflexes - The patient's reflexes were 1+ in all extremities and he had no pathological reflexes.  Sensory - Light touch, temperature/pinprick were assessed and were normal.    Coordination - The patient had normal movements in the hands with no ataxia or dysmetria.  Tremor was absent.  Gait and Station - The patient's transfers, posture, gait, station, and turns were observed as normal.   Data reviewed: I personally reviewed the images and agree with the radiology interpretations.  Ct Head Wo Contrast 01/31/2016  1. No acute findings. No intracranial hemorrhage or edema.  2. 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  3. Meningioma overlying the left occipital lobe, stable in size, now partially calcified.   EEG - This awake and drowsy EEG is within normal limits for age. A normal EEG does not exclude a clinical diagnosis of epilepsy. Clinical correlation is advised.  CUS - Bilateral: 1-39% ICA stenosis. Vertebral artery flow is antegrade.  2D echo - Left ventricle: The cavity size was normal. Wall thickness was  increased in a pattern of mild LVH. There was mild focal basal  hypertrophy of the septum. Systolic function was normal. The  estimated ejection fraction was in the range of 55% to 60%. Wall  motion was normal; there were no regional wall motion  abnormalities. - Aortic valve: Valve mobility was restricted. There was mild  stenosis. There was mild regurgitation. - Aortic root: The aortic root was  mildly dilated. - Ascending aorta: The ascending aorta was mildly dilated. - Mitral valve: Calcified annulus. There was mild regurgitation. - Left atrium: The atrium was severely dilated. - Right atrium: The atrium was moderately dilated. - Pulmonary arteries: Systolic pressure was mildly increased. PA  peak pressure: 36 mm Hg (S). Impressions: - Normal LV systolic function; biatrial enlargement; calcified  aortic valve with mild AS (mean gradient 17 mmHg); mild AI; mild  MR; trace TR with mildly elevated pulmonary pressure; mildly  dilated aortic root, ascending aorta and aortic arch; suggest CTA   or MRA to further assess.  CT Angio Chest Aorta with and without CM 02/02/2016 1. Biatrial enlargement with a small thrombus in the left atrial appendage. 2. No increase in size of the previously seen ascending thoracic aortic aneurysm, currently measuring 4.5 cm in maximum diameter. Recommend semi-annual imaging followup by CTA or MRA and referral to cardiothoracic surgery if not already obtained. This recommendation follows 2010 ACCF/AHA/AATS/ACR/ASA/SCA/SCAI/SIR/STS/SVM Guidelines for the Diagnosis and Management of Patients With Thoracic Aortic Disease. Circulation. 2010; 121: [id number] 3. Small to moderate-sized right pleural effusion and small left pleural effusion. 4. Bilateral dependent atelectasis, greater on the right. 5. Dense atheromatous coronary artery calcifications.   MRI Head 02/02/2016 1. Patchy small volume acute ischemic nonhemorrhagic  left MCA territory infarcts as above. 2. Remote bilateral cerebellar infarcts, right larger than left. 3. Moderate generalized age-related cerebral atrophy. 4. 2.1 cm left parietal convexity meningioma. No associated edema.  MRA Head  02/02/2016 1. No definite large or proximal arterial branch occlusion within the intracranial circulation. 2. Question proximal right M2 branch occlusion, not entirely certain given the absence of acute  or chronic infarct within this territory. Finding may be related to motion artifact on this exam. 3. Probable proximal nonocclusive moderate M2 stenoses bilaterally. 4. Diminutive vertebrobasilar system with fetal origin of the right PCA. Left PCA also largely supplied via a prominent left posterior communicating artery. 5. Nonvisualization of the right PICA, which may be occluded given the remote right cerebellar infarct.  CTA chest 07/23/16 1. Stable ascending thoracic aorta measuring 43 mm in maximum dimension. Recommend annual imaging followup by CTA or MRA. This recommendation follows 2010 ACCF/AHA/AATS/ACR/ASA/SCA/SCAI/SIR/STS/SVM Guidelines for the Diagnosis and Management of Patients with Thoracic Aortic Disease. Circulation. 2010; 121: [id number] 2. Coronary artery calcification and aortic atherosclerotic calcification. 3. Chronic RIGHT effusion with basilar atelectasis.  CTA neck 07/23/16 1. Extensive atherosclerotic calcifications within the common carotid arteries bilaterally at both carotid bifurcations, and at the aortic arch without focal stenosis. 2. Tortuosity of the cervical internal carotid arteries bilaterally without significant stenosis. 3. No acute or focal lesion to account for the patient's left-sided amaurosis fugax. 4. Multilevel spondylosis of the cervical spine.  Component     Latest Ref Rng 02/01/2016  Cholesterol     0 - 200 mg/dL 161  Triglycerides     <150 mg/dL 58  HDL Cholesterol     >40 mg/dL 46  Total CHOL/HDL Ratio      3.5  VLDL     0 - 40 mg/dL 12  LDL (calc)     0 - 99 mg/dL 103 (H)  Hemoglobin A1C     4.8 - 5.6 % 5.9 (H)  Mean Plasma Glucose      123   Component     Latest Ref Rng & Units 08/18/2016  Cholesterol     0 - 200 mg/dL 192  Triglycerides     0.0 - 149.0 mg/dL 93.0  HDL Cholesterol     >39.00 mg/dL 58.10  VLDL     0.0 - 40.0 mg/dL 18.6  LDL (calc)     0 - 99 mg/dL 115 (H)  Total CHOL/HDL Ratio      3  NonHDL       133.52  Total Bilirubin     0.2 - 1.2 mg/dL 1.0  Bilirubin, Direct     0.0 - 0.3 mg/dL 0.2  Alkaline Phosphatase     39 - 117 U/L 66  AST     0 - 37 U/L 44 (H)  ALT     0 - 53 U/L 42  Total Protein     6.0 - 8.3 g/dL 6.7  Albumin     3.5 - 5.2 g/dL 3.4 (L)  TSH     0.35 - 4.50 uIU/mL 1.45    Assessment: As you may recall, he is a 82 y.o. Caucasian male with PMH of CAD, HLD, afib on coumadin, TIA, HTN, and previous MI who was admitted on 01/31/16 for dysarthria and right face and arm weakness. His coumadin was on hold due to LE vascular procedure. He received IV TPA. MRI showed acute left MCA small infarcts and chronic b/l cerebellar infarcts. MRA head questionable right M2 branch occlusion and  right PICA occlusion. Coumadin resumed. EEG and CUS unremarkable. TTE EF 55-60%, but concerning for dilated aortic root. CTA chest aorta showed unchanged ascending aortic aneurysm, but also found to have small thrombus in the LAA. Therefore, coumadin was changed to eliquis (coumadin only had on dose). He was discharged with eliquis and zetia due to elevated LFTs secondary to autoimmune hepatitis treated with prednisone. Later zetia was discontinued by PCP. Pt had left eye amaurosis fugax in 05/2016 that lasted 30 sec. Had CTA neck showed no significant ICA stenosis and repeat CTA chest showed resolution of LAA clot. Repeat LDL 115. Currently ALT 42 and AST 44. Has CHF in 07/2014 in [facility] s/p thoracentesis. Follows with cardiology for CHF.  Plan:  - continue eliquis for stroke prevention (last Cre 1.35) - check BP at home - Follow up with primary care physician for stroke risk factor modification. Recommend maintain blood pressure goal <130/80, diabetes with hemoglobin A1c goal below 6.5% and lipids with LDL cholesterol goal below 70 mg/dL.  - follow up with Dr. Burt Knack closely for CHF. If SOB and tiredness getting worse, you may call cardiology for earlier appointment.  - follow up with PCP for cholesterol  management.  - salt free diet and home exercise - follow up in 6 months.  I spent more than 25 minutes of face to face time with the patient. Greater than 50% of time was spent in counseling and coordination of care. We discussed CTA neck and chest results, etiology for tiredness, follow up with cardiology and compliance with eliquis.    No orders of the defined types were placed in this encounter.   No orders of the defined types were placed in this encounter.   Patient Instructions  - continue eliquis for stroke prevention (last Cre 1.35) - check BP at home - Follow up with primary care physician for stroke risk factor modification. Recommend maintain blood pressure goal <130/80, diabetes with hemoglobin A1c goal below 6.5% and lipids with LDL cholesterol goal below 70 mg/dL.  - follow up with Dr. Burt Knack closely for CHF. If SOB and tiredness getting worse, you may call cardiology for earlier appointment.  - follow up with PCP for cholesterol management.  - salt free diet and home exercise - follow up in 6 months.   Rosalin Hawking, MD PhD [facility] [address], [place], [place] [address] [phone]

## 2016-10-13 NOTE — Patient Instructions (Addendum)
-   continue eliquis for stroke prevention (last Cre 1.35) - check BP at home - Follow up with primary care physician for stroke risk factor modification. Recommend maintain blood pressure goal <130/80, diabetes with hemoglobin A1c goal below 6.5% and lipids with LDL cholesterol goal below 70 mg/dL.  - follow up with Dr. Burt Knack closely for CHF. If SOB and tiredness getting worse, you may call cardiology for earlier appointment.  - follow up with PCP for cholesterol management.  - salt free diet and home exercise - follow up in 6 months.

## 2016-10-29 ENCOUNTER — Other Ambulatory Visit (INDEPENDENT_AMBULATORY_CARE_PROVIDER_SITE_OTHER): Payer: Medicare Other

## 2016-10-29 DIAGNOSIS — R7989 Other specified abnormal findings of blood chemistry: Secondary | ICD-10-CM | POA: Diagnosis not present

## 2016-10-29 DIAGNOSIS — R945 Abnormal results of liver function studies: Principal | ICD-10-CM

## 2016-10-29 LAB — HEPATIC FUNCTION PANEL
ALBUMIN: 3.7 g/dL (ref 3.5–5.2)
ALT: 39 U/L (ref 0–53)
AST: 38 U/L — ABNORMAL HIGH (ref 0–37)
Alkaline Phosphatase: 82 U/L (ref 39–117)
BILIRUBIN TOTAL: 0.8 mg/dL (ref 0.2–1.2)
Bilirubin, Direct: 0.2 mg/dL (ref 0.0–0.3)
TOTAL PROTEIN: 6.8 g/dL (ref 6.0–8.3)

## 2016-10-30 ENCOUNTER — Other Ambulatory Visit: Payer: Self-pay

## 2016-10-30 DIAGNOSIS — R7989 Other specified abnormal findings of blood chemistry: Secondary | ICD-10-CM

## 2016-10-30 DIAGNOSIS — R945 Abnormal results of liver function studies: Principal | ICD-10-CM

## 2016-10-31 ENCOUNTER — Encounter: Payer: Self-pay | Admitting: Cardiovascular Disease

## 2016-11-07 ENCOUNTER — Telehealth: Payer: Self-pay | Admitting: Oncology

## 2016-11-07 ENCOUNTER — Ambulatory Visit (HOSPITAL_BASED_OUTPATIENT_CLINIC_OR_DEPARTMENT_OTHER): Payer: Medicare Other | Admitting: Oncology

## 2016-11-07 ENCOUNTER — Other Ambulatory Visit (HOSPITAL_BASED_OUTPATIENT_CLINIC_OR_DEPARTMENT_OTHER): Payer: Medicare Other

## 2016-11-07 VITALS — BP 142/71 | HR 81 | Temp 97.5°F | Resp 18 | Wt 189.4 lb

## 2016-11-07 DIAGNOSIS — Z85820 Personal history of malignant melanoma of skin: Secondary | ICD-10-CM

## 2016-11-07 DIAGNOSIS — C439 Malignant melanoma of skin, unspecified: Secondary | ICD-10-CM

## 2016-11-07 LAB — CBC WITH DIFFERENTIAL/PLATELET
BASO%: 0.8 % (ref 0.0–2.0)
BASOS ABS: 0.1 10*3/uL (ref 0.0–0.1)
EOS ABS: 0.1 10*3/uL (ref 0.0–0.5)
EOS%: 1.8 % (ref 0.0–7.0)
HEMATOCRIT: 46.8 % (ref 38.4–49.9)
HEMOGLOBIN: 15.2 g/dL (ref 13.0–17.1)
LYMPH%: 52.4 % — ABNORMAL HIGH (ref 14.0–49.0)
MCH: 32.3 pg (ref 27.2–33.4)
MCHC: 32.4 g/dL (ref 32.0–36.0)
MCV: 99.7 fL — AB (ref 79.3–98.0)
MONO#: 1 10*3/uL — ABNORMAL HIGH (ref 0.1–0.9)
MONO%: 12.3 % (ref 0.0–14.0)
NEUT#: 2.7 10*3/uL (ref 1.5–6.5)
NEUT%: 32.7 % — AB (ref 39.0–75.0)
Platelets: 103 10*3/uL — ABNORMAL LOW (ref 140–400)
RBC: 4.69 10*6/uL (ref 4.20–5.82)
RDW: 15.2 % — AB (ref 11.0–14.6)
WBC: 8.2 10*3/uL (ref 4.0–10.3)
lymph#: 4.3 10*3/uL — ABNORMAL HIGH (ref 0.9–3.3)

## 2016-11-07 LAB — COMPREHENSIVE METABOLIC PANEL
ALT: 48 U/L (ref 0–55)
ANION GAP: 8 meq/L (ref 3–11)
AST: 51 U/L — ABNORMAL HIGH (ref 5–34)
Albumin: 3.3 g/dL — ABNORMAL LOW (ref 3.5–5.0)
Alkaline Phosphatase: 87 U/L (ref 40–150)
BILIRUBIN TOTAL: 1.01 mg/dL (ref 0.20–1.20)
BUN: 26.8 mg/dL — ABNORMAL HIGH (ref 7.0–26.0)
CALCIUM: 9.2 mg/dL (ref 8.4–10.4)
CO2: 29 mEq/L (ref 22–29)
CREATININE: 1.6 mg/dL — AB (ref 0.7–1.3)
Chloride: 105 mEq/L (ref 98–109)
EGFR: 38 mL/min/{1.73_m2} — AB (ref >90)
Glucose: 175 mg/dl — ABNORMAL HIGH (ref 70–140)
Potassium: 4.7 mEq/L (ref 3.5–5.1)
Sodium: 142 mEq/L (ref 136–145)
TOTAL PROTEIN: 6.8 g/dL (ref 6.4–8.3)

## 2016-11-07 LAB — LACTATE DEHYDROGENASE: LDH: 255 U/L — AB (ref 125–245)

## 2016-11-07 NOTE — Telephone Encounter (Signed)
Appointments scheduled per 12/22 LOS. Patient given AVS report and calendars with future scheduled appointments. °

## 2016-11-07 NOTE — Progress Notes (Signed)
Hematology and Oncology Follow Up Visit  Arthur [name] [id number] 12-29-1924 82 y.o. 11/07/2016 9:06 AM Arthur [name], MDJohn, [name], MD   Principle Diagnosis: 82 year old gentleman diagnosed with Malignant melanoma initially diagnosed in 2012 and found to have a T2b lesion with the depth of invasion of 1.75 mm and a Clark's level IV. He developed regional relapse in October of 2014.    Prior Therapy: 1. In May of 2012, he underwent wide excision and sentinel lymph node biopsy done by Dr. Hassell Done which showed no lymph node involvement and no residual malignant melanoma.  2. he is status post Re-excision of site of melanoma satellite lesion of left forearm. This was done on 08/23/2013.   Current therapy: Observation and surveillance.  Interim History:  [name] presents today for a followup visit. Since the last visit, he reports feeling well without any recent illnesses or hospitalizations. He continues on prednisone at 5 mg daily for his autoimmune hepatitis. He denied any recent complications related to that. He remains active and attends to activities of daily living.  He does not report any skin rashes or lesions. Has not reported any lymphadenopathy or masses. He does report exertional dyspnea and lower extremity edema at times. He continues to follow with dermatology every 6 months.   He does not report any headaches or blurry vision or double vision. Does not report any chest pain or shortness of breath. As that report any cough or hemoptysis. Does not report any nausea or vomiting or abdominal pain. Did not report any hematochezia or melena. Did not report any frequency urgency or hesitancy. Does not report any musculoskeletal complaints. Her recent rashes or lesions. The rest of the review of system is unremarkable.   Medications: I have reviewed the patient's current medications.  Current Outpatient Prescriptions  Medication Sig Dispense Refill  . apixaban (ELIQUIS) 5 MG TABS  tablet Take 1 tablet (5 mg total) by mouth 2 (two) times daily. 60 tablet 2  . furosemide (LASIX) 20 MG tablet Take 1 tablet (20 mg total) by mouth every other day. 45 tablet 3  . metoprolol succinate (TOPROL-XL) 25 MG 24 hr tablet Take 25 mg by mouth daily.    . predniSONE (DELTASONE) 10 MG tablet Take 1 tablet (10 mg total) by mouth daily. (Patient taking differently: Take 5 mg by mouth daily. ) 100 tablet 3   No current facility-administered medications for this visit.      Allergies:  Allergies  Allergen Reactions  . Penicillins Hives       Physical Exam: Blood pressure (!) 142/71, pulse 81, temperature 97.5 F (36.4 C), temperature source Oral, resp. rate 18, weight 189 lb 6.4 oz (85.9 kg), SpO2 97 %. ECOG: 1 General appearance: Alert, awake gentleman without distress. Head: Normocephalic, without any masses or lesions. No oral ulcers or lesions noted. Neck: no adenopathy or thyromegaly. Lymph nodes: Cervical, supraclavicular, and axillary nodes normal. No inguinal adenopathy noted either. Heart:regular rate and rhythm, S1, S2 normal, no murmur, click, rub or gallop Lung:chest clear, no wheezing, rales, normal symmetric air entry.  Abdomin: soft, non-tender, without masses or organomegaly no shifting dullness or ascites. EXT:no erythema, induration, or nodules Skin showed no rashes or lesions.   Lab Results: Lab Results  Component Value Date   WBC 8.2 11/07/2016   HGB 15.2 11/07/2016   HCT 46.8 11/07/2016   MCV 99.7 (H) 11/07/2016   PLT 103 (L) 11/07/2016     Chemistry      Component  Value Date/Time   NA 140 08/04/2016 1216   NA 139 05/06/2016 0857   K 4.4 08/04/2016 1216   K 4.9 05/06/2016 0857   CL 103 08/04/2016 1216   CO2 26 08/04/2016 1216   CO2 26 05/06/2016 0857   BUN 27 (H) 08/04/2016 1216   BUN 24.7 05/06/2016 0857   CREATININE 1.35 (H) 08/04/2016 1216   CREATININE 1.4 (H) 05/06/2016 0857      Component Value Date/Time   CALCIUM 8.9 08/04/2016  1216   CALCIUM 8.8 05/06/2016 0857   ALKPHOS 82 10/29/2016 1605   ALKPHOS 73 05/06/2016 0857   AST 38 (H) 10/29/2016 1605   AST 76 (H) 05/06/2016 0857   ALT 39 10/29/2016 1605   ALT 89 (H) 05/06/2016 0857   BILITOT 0.8 10/29/2016 1605   BILITOT 1.01 05/06/2016 0857      IMPRESSION: 1. No evidence for hypermetabolic tumor. 2. Cardiac enlargement, aortic atherosclerosis and 3 vessel coronary artery calcification. There are bilateral pleural effusions noted right greater than left. Findings may be the sequelae of CHF. 3. Gallstones.       82 year old gentleman with the following issues:   1. Malignant melanoma initially diagnosed in 2012 and found to have a T2b lesion with the depth of invasion of 1.75 mm and a Clark's level IV. No ulcerations or vascular invasion was noted and was treated with a wide excision and a sentinel lymph node biopsy that was negative. He had a punch biopsy of a lesion in his left elbow which showed deposits of atypical melanocytes. He is status post reexcision of his melanoma lesion done on 08/23/2013. The pathology reveals area of malignant melanoma with negative resection margins.   His PET CT scan on 05/06/2016 showed no evidence of recurrent disease.   His physical examination and laboratory testing did not support any evidence of relapse. The plan is to continue on active surveillance and repeat imaging studies in 6 months.  2. History of CVA: He does have atrial fibrillation and chronically anticoagulated on Eliquis.  3. Dermatology surveillance: I urged him to continue to do so every 6 months. He remains up-to-date at this time.  4. Followup: Will be in 6 months for clinical visit.    [facility], MD 12/22/20179:06 AM

## 2016-11-21 ENCOUNTER — Encounter: Payer: Self-pay | Admitting: Cardiovascular Disease

## 2016-11-21 ENCOUNTER — Ambulatory Visit (INDEPENDENT_AMBULATORY_CARE_PROVIDER_SITE_OTHER): Payer: Medicare Other | Admitting: Cardiovascular Disease

## 2016-11-21 VITALS — BP 132/88 | HR 58 | Ht 74.0 in | Wt 193.6 lb

## 2016-11-21 DIAGNOSIS — I10 Essential (primary) hypertension: Secondary | ICD-10-CM | POA: Diagnosis not present

## 2016-11-21 DIAGNOSIS — I5032 Chronic diastolic (congestive) heart failure: Secondary | ICD-10-CM | POA: Diagnosis not present

## 2016-11-21 DIAGNOSIS — I350 Nonrheumatic aortic (valve) stenosis: Secondary | ICD-10-CM

## 2016-11-21 NOTE — Progress Notes (Signed)
Cardiology Office Note Date:  11/21/2016   ID:  Arthur [name], DOB Dec 20, 1924, MRN [id number]  PCP:  [name], MD  Cardiologist:  Sherren Mocha, MD    Chief Complaint  Patient presents with  . Congestive Heart Failure     History of Present Illness: Arthur [name] is a 83 y.o. male who presents for follow-up evaluation.  The patient has per and atrial fibrillation, hyperlipidemia, hypertension, and history of stroke. He also has coronary artery disease with remote anteroseptal and inferior wall MI in the 1990s. He underwent PCI of the LAD in 2000. Other comorbid conditions include venous varicosities, chronically elevated LFTs, and melanoma. He is treated with chronic prednisone for autoimmune hepatitis. He had an episode of congestive heart failure last year and he underwent thoracentesis at that time. He apparently responded well to IV diuresis.  He is here alone today. The patient is doing well. He complains of generalized fatigue. Otherwise denies chest pain, orthopnea, PND, heart palpitations, lightheadedness, or syncope. He admits to exertional dyspnea with no recent change in symptoms. He still walks on the treadmill 10-15 minutes every day. States that his primary limitation is leg weakness.  Past Medical History:  Diagnosis Date  . Arrhythmia    1995  . Atrial fibrillation ([place])   . CHF (congestive heart failure) ([place]) 10/13/2016  . Coronary artery disease    MI, atherectomy 1993  . DDD (degenerative disc disease)   . Eczema   . Elevated LFTs   . History of gallstones   . History of transient ischemic attack (TIA) 2005   double vision  . Hyperlipidemia   . Hypertension   . Myocardial infarct 12/98   inferior  . Pneumonia    long time ago  . Skin cancer   . Stroke ([place])   . Varicose veins     Past Surgical History:  Procedure Laterality Date  . AMPUTATION Right 05/10/2013   Procedure: RIGHT SECOND TOE AMPUTATION;  Surgeon: Hessie Dibble, MD;  Location:  [facility];  Service: Orthopedics;  Laterality: Right;  . APPENDECTOMY    . CARDIAC CATHETERIZATION  04/08/99   LAD 70% INTER 40-50% RCA 30-40%  . CORONARY ANGIOPLASTY  04/08/99   LAD  . CORONARY ANGIOPLASTY  10/17/97   RCA   . CORONARY ANGIOPLASTY WITH STENT PLACEMENT  04/08/99   LAD  . MELANOMA EXCISION  2013   lt arm  . MELANOMA EXCISION Left 08/23/2013   Procedure: MELANOMA EXCISION;  Surgeon: Pedro Earls, MD;  Location: [facility];  Service: General;  Laterality: Left;  . TONSILLECTOMY    . VIDEO BRONCHOSCOPY Bilateral 05/07/2015   Procedure: VIDEO BRONCHOSCOPY WITHOUT FLUORO;  Surgeon: Rigoberto Noel, MD;  Location: [facility];  Service: Cardiopulmonary;  Laterality: Bilateral;    Current Outpatient Prescriptions  Medication Sig Dispense Refill  . apixaban (ELIQUIS) 5 MG TABS tablet Take 1 tablet (5 mg total) by mouth 2 (two) times daily. 60 tablet 2  . furosemide (LASIX) 20 MG tablet Take 1 tablet (20 mg total) by mouth every other day. 45 tablet 3  . metoprolol succinate (TOPROL-XL) 25 MG 24 hr tablet Take 25 mg by mouth daily.    . predniSONE (DELTASONE) 5 MG tablet Take 5 mg by mouth daily.     No current facility-administered medications for this visit.     Allergies:   Penicillins   Social History:  The patient  reports that he quit smoking about 49 years  ago. His smoking use included Cigars and Pipe. He quit after 7.00 years of use. He has never used smokeless tobacco. He reports that he drinks about 0.6 oz of alcohol per week . He reports that he does not use drugs.   Family History:  The patient's  family history includes Heart attack (age of onset: 26) in his mother; Pneumonia in his father.    ROS:  Please see the history of present illness.   All other systems are reviewed and negative.    PHYSICAL EXAM: VS:  BP 132/88   Pulse (!) 58   Ht 6\' 2"  (1.88 m)   Wt 193 lb 9.6 oz (87.8 kg)   BMI 24.86 kg/m  , BMI Body mass index is 24.86  kg/m. GEN: Well nourished, well developed, in no acute distress - pleasant elderly male HEENT: normal  Neck: no JVD, no masses. No carotid bruits Cardiac: Irregularly irregular with grade 2/6 mid peaking harsh systolic murmur best heart at the left lower sternal border                Respiratory:  clear to auscultation bilaterally, normal work of breathing GI: soft, nontender, nondistended, + BS MS: no deformity or atrophy  Ext: 1+ bilateral pretibial edema, pedal pulses 2+= bilaterally Skin: chronic stasis changes Neuro:  Strength and sensation are intact Psych: euthymic mood, full affect  EKG:  EKG is ordered today. The ekg ordered today shows atrial fibrillation 58 bpm, age-indeterminate anterior infarct, age indeterminate inferior infarct. ST and T-wave abnormality consider lateral ischemia  Recent Labs: 07/02/2016: Brain Natriuretic Peptide 360.9 08/18/2016: TSH 1.45 11/07/2016: ALT 48; BUN 26.8; Creatinine 1.6; HGB 15.2; Platelets 103; Potassium 4.7; Sodium 142   Lipid Panel     Component Value Date/Time   CHOL 192 08/18/2016 1534   TRIG 93.0 08/18/2016 1534   HDL 58.10 08/18/2016 1534   CHOLHDL 3 08/18/2016 1534   VLDL 18.6 08/18/2016 1534   LDLCALC 115 (H) 08/18/2016 1534      Wt Readings from Last 3 Encounters:  11/21/16 193 lb 9.6 oz (87.8 kg)  11/07/16 189 lb 6.4 oz (85.9 kg)  10/13/16 190 lb 12.8 oz (86.5 kg)     Cardiac Studies Reviewed: 2D Echo 02-01-2016: Study Conclusions  - Left ventricle: The cavity size was normal. Wall thickness was   increased in a pattern of mild LVH. There was mild focal basal   hypertrophy of the septum. Systolic function was normal. The   estimated ejection fraction was in the range of 55% to 60%. Wall   motion was normal; there were no regional wall motion   abnormalities. - Aortic valve: Valve mobility was restricted. There was mild   stenosis. There was mild regurgitation. - Aortic root: The aortic root was mildly  dilated. - Ascending aorta: The ascending aorta was mildly dilated. - Mitral valve: Calcified annulus. There was mild regurgitation. - Left atrium: The atrium was severely dilated. - Right atrium: The atrium was moderately dilated. - Pulmonary arteries: Systolic pressure was mildly increased. PA   peak pressure: 36 mm Hg (S).  Impressions:  - Normal LV systolic function; biatrial enlargement; calcified   aortic valve with mild AS (mean gradient 17 mmHg); mild AI; mild   MR; trace TR with mildly elevated pulmonary pressure; mildly   dilated aortic root, ascending aorta and aortic arch; suggest CTA   or MRA to further assess.  CTA 07-23-2016: IMPRESSION: 1. Extensive atherosclerotic calcifications within the common carotid arteries bilaterally  at both carotid bifurcations, and at the aortic arch without focal stenosis. 2. Tortuosity of the cervical internal carotid arteries bilaterally without significant stenosis. 3. No acute or focal lesion to account for the patient's left-sided amaurosis fugax. 4. Multilevel spondylosis of the cervical spine.  CTA Chest 07-23-2016: IMPRESSION: 1. Stable ascending thoracic aorta measuring 43 mm in maximum dimension. Recommend annual imaging followup by CTA or MRA. This recommendation follows 2010 ACCF/AHA/AATS/ACR/ASA/SCA/SCAI/SIR/STS/SVM Guidelines for the Diagnosis and Management of Patients with Thoracic Aortic Disease. Circulation. 2010; 121: [id number] 2. Coronary artery calcification and aortic atherosclerotic calcification. 3. Chronic RIGHT effusion with basilar atelectasis.  ASSESSMENT AND PLAN: 1.  Chronic diastolic heart failure: The patient appears stable. He watches sodium intake. He continues on furosemide and his weight is essentially unchanged. Lower extremity swelling is improved from previous. Continue clinical follow-up. Most recent echocardiogram is reviewed and demonstrates normal LV systolic function.  2. Permanent atrial  fibrillation: Managed with rate control and anticoagulation. The patient has been switched to Eliquis because of a stroke that occurred on warfarin. Seems to be tolerating this well and reports no bleeding problems.  3. Coronary artery disease, native vessel, with old MI: The symptoms of angina. Continue beta blocker. No aspirin in the context of chronic oral anticoagulation.  4. Aortic stenosis: Mild by echo. Exam consistent with mild to moderate aortic stenosis. Will repeat an echocardiogram in 6 months when he returns for follow-up evaluation.  Current medicines are reviewed with the patient today.  The patient does not have concerns regarding medicines.  Labs/ tests ordered today include:   Orders Placed This Encounter  Procedures  . EKG 12-Lead  . ECHOCARDIOGRAM COMPLETE    Disposition:   FU 6 months with an echocardiogram  Signed, Sherren Mocha, MD  11/21/2016 1:29 PM    [facility] [place], [place], [place]  [address] Phone: [phone]; Fax: [phone]

## 2016-11-21 NOTE — Patient Instructions (Signed)
Medication Instructions:  Your physician recommends that you continue on your current medications as directed. Please refer to the Current Medication list given to you today.  Labwork: No new orders.   Testing/Procedures: Your physician has requested that you have an echocardiogram in 6 MONTHS. Echocardiography is a painless test that uses sound waves to create images of your heart. It provides your doctor with information about the size and shape of your heart and how well your heart's chambers and valves are working. This procedure takes approximately one hour. There are no restrictions for this procedure.  Follow-Up: Your physician wants you to follow-up in: 6 MONTHS with Dr Cooper.  You will receive a reminder letter in the mail two months in advance. If you don't receive a letter, please call our office to schedule the follow-up appointment.   Any Other Special Instructions Will Be Listed Below (If Applicable).     If you need a refill on your cardiac medications before your next appointment, please call your pharmacy.   

## 2017-01-02 ENCOUNTER — Other Ambulatory Visit (INDEPENDENT_AMBULATORY_CARE_PROVIDER_SITE_OTHER): Payer: Medicare Other

## 2017-01-02 DIAGNOSIS — K754 Autoimmune hepatitis: Secondary | ICD-10-CM

## 2017-01-02 LAB — HEPATIC FUNCTION PANEL
ALBUMIN: 3.6 g/dL (ref 3.5–5.2)
ALT: 31 U/L (ref 0–53)
AST: 34 U/L (ref 0–37)
Alkaline Phosphatase: 57 U/L (ref 39–117)
Bilirubin, Direct: 0.3 mg/dL (ref 0.0–0.3)
TOTAL PROTEIN: 6.6 g/dL (ref 6.0–8.3)
Total Bilirubin: 1.2 mg/dL (ref 0.2–1.2)

## 2017-01-04 ENCOUNTER — Encounter: Payer: Self-pay | Admitting: Cardiovascular Disease

## 2017-01-08 ENCOUNTER — Other Ambulatory Visit: Payer: Self-pay

## 2017-01-08 ENCOUNTER — Telehealth: Payer: Self-pay | Admitting: Nurse Practitioner

## 2017-01-08 DIAGNOSIS — R7989 Other specified abnormal findings of blood chemistry: Secondary | ICD-10-CM

## 2017-01-08 DIAGNOSIS — R945 Abnormal results of liver function studies: Principal | ICD-10-CM

## 2017-01-08 NOTE — Telephone Encounter (Signed)
Spoke with Reon's wife this morning - [name].   She wanted to give an update - he may be moving out of intensive care today and seems to be progressing.   She will call and let us know if she has questions.   She has my number and the office number.  [name], RN, [facility] [address] [place], [place]  [address] [phone]

## 2017-01-22 ENCOUNTER — Telehealth: Payer: Self-pay | Admitting: Nurse Practitioner
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
## 2017-01-22 NOTE — Telephone Encounter (Signed)
New message      Pt request to talk to the [facility] or her nurse.  He would not tell me what he wanted.  Please call

## 2017-01-22 NOTE — Telephone Encounter (Signed)
Noted. thx 

## 2017-02-02 ENCOUNTER — Telehealth: Payer: Self-pay | Admitting: Internal Medicine
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
## 2017-02-02 NOTE — Telephone Encounter (Signed)
Spoke with pt regarding results letter, pt aware.

## 2017-02-03 ENCOUNTER — Encounter: Payer: Self-pay | Admitting: Nurse Practitioner

## 2017-02-03 ENCOUNTER — Ambulatory Visit (INDEPENDENT_AMBULATORY_CARE_PROVIDER_SITE_OTHER): Payer: Medicare Other | Admitting: Nurse Practitioner

## 2017-02-03 ENCOUNTER — Ambulatory Visit (INDEPENDENT_AMBULATORY_CARE_PROVIDER_SITE_OTHER): Payer: Medicare Other | Admitting: Internal Medicine

## 2017-02-03 ENCOUNTER — Ambulatory Visit
Admission: RE | Admit: 2017-02-03 | Discharge: 2017-02-03 | Disposition: A | Discharge status: Home / Self Care | Payer: Medicare Other | Source: Ambulatory Visit | Attending: Nurse Practitioner | Admitting: Nurse Practitioner

## 2017-02-03 ENCOUNTER — Encounter: Payer: Self-pay | Admitting: Internal Medicine

## 2017-02-03 VITALS — BP 136/88 | HR 63 | Ht 74.0 in | Wt 195.8 lb

## 2017-02-03 VITALS — BP 124/82 | HR 64 | Temp 97.9°F | Ht 74.0 in | Wt 195.0 lb

## 2017-02-03 DIAGNOSIS — J90 Pleural effusion, not elsewhere classified: Secondary | ICD-10-CM | POA: Diagnosis not present

## 2017-02-03 DIAGNOSIS — R739 Hyperglycemia, unspecified: Secondary | ICD-10-CM | POA: Diagnosis not present

## 2017-02-03 DIAGNOSIS — S81809D Unspecified open wound, unspecified lower leg, subsequent encounter: Secondary | ICD-10-CM | POA: Diagnosis not present

## 2017-02-03 DIAGNOSIS — J9383 Other pneumothorax: Secondary | ICD-10-CM

## 2017-02-03 DIAGNOSIS — I10 Essential (primary) hypertension: Secondary | ICD-10-CM | POA: Diagnosis not present

## 2017-02-03 DIAGNOSIS — I509 Heart failure, unspecified: Secondary | ICD-10-CM | POA: Diagnosis not present

## 2017-02-03 NOTE — Patient Instructions (Addendum)
Please continue all other medications as before, and refills have been done if requested.  Please have the pharmacy call with any other refills you may need.  Please continue your efforts at being more active, low cholesterol diet, and weight control.  Please keep your appointments with your specialists as you may have planned - wound clinic in the AM

## 2017-02-03 NOTE — Patient Instructions (Addendum)
We will be checking the following labs today - BMET, CBC, BNP  Please go to [facility] to [place] on the first floor for a chest Xray - you may walk in.     Medication Instructions:    Continue with your current medicines. BUT   I want you to increase your Lasix to 2 pills (40 mg) for the next 3 days - then back to 20 mg a day    Testing/Procedures To Be Arranged:  N/A  Follow-Up:   See me in a week    Other Special Instructions:   Restrict your salt  Weigh daily    If you need a refill on your cardiac medications before your next appointment, please call your pharmacy.   Call the [place] office at [phone] if you have any questions, problems or concerns.
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
## 2017-02-03 NOTE — Progress Notes (Signed)
Pre visit review using our clinic review tool, if applicable. No additional management support is needed unless otherwise documented below in the visit note. 

## 2017-02-04 ENCOUNTER — Telehealth: Payer: Self-pay | Admitting: Nurse Practitioner

## 2017-02-04 ENCOUNTER — Other Ambulatory Visit: Payer: Self-pay | Admitting: Nurse Practitioner

## 2017-02-04 ENCOUNTER — Encounter (HOSPITAL_BASED_OUTPATIENT_CLINIC_OR_DEPARTMENT_OTHER): Payer: Medicare Other | Attending: Surgery

## 2017-02-04 DIAGNOSIS — I2510 Atherosclerotic heart disease of native coronary artery without angina pectoris: Secondary | ICD-10-CM | POA: Insufficient documentation

## 2017-02-04 DIAGNOSIS — M869 Osteomyelitis, unspecified: Secondary | ICD-10-CM | POA: Diagnosis not present

## 2017-02-04 DIAGNOSIS — Z87891 Personal history of nicotine dependence: Secondary | ICD-10-CM | POA: Diagnosis not present

## 2017-02-04 DIAGNOSIS — N183 Chronic kidney disease, stage 3 (moderate): Secondary | ICD-10-CM | POA: Diagnosis not present

## 2017-02-04 DIAGNOSIS — I509 Heart failure, unspecified: Secondary | ICD-10-CM | POA: Diagnosis not present

## 2017-02-04 DIAGNOSIS — I872 Venous insufficiency (chronic) (peripheral): Secondary | ICD-10-CM | POA: Insufficient documentation

## 2017-02-04 DIAGNOSIS — L97812 Non-pressure chronic ulcer of other part of right lower leg with fat layer exposed: Secondary | ICD-10-CM | POA: Insufficient documentation

## 2017-02-04 DIAGNOSIS — L97822 Non-pressure chronic ulcer of other part of left lower leg with fat layer exposed: Secondary | ICD-10-CM | POA: Diagnosis not present

## 2017-02-04 DIAGNOSIS — J90 Pleural effusion, not elsewhere classified: Secondary | ICD-10-CM

## 2017-02-04 DIAGNOSIS — Z86718 Personal history of other venous thrombosis and embolism: Secondary | ICD-10-CM | POA: Insufficient documentation

## 2017-02-04 DIAGNOSIS — I4891 Unspecified atrial fibrillation: Secondary | ICD-10-CM | POA: Insufficient documentation

## 2017-02-04 DIAGNOSIS — I130 Hypertensive heart and chronic kidney disease with heart failure and stage 1 through stage 4 chronic kidney disease, or unspecified chronic kidney disease: Secondary | ICD-10-CM | POA: Diagnosis not present

## 2017-02-04 DIAGNOSIS — I252 Old myocardial infarction: Secondary | ICD-10-CM | POA: Insufficient documentation

## 2017-02-04 LAB — CBC
Hematocrit: 41 % (ref 37.5–51.0)
Hemoglobin: 13.5 g/dL (ref 13.0–17.7)
MCH: 32.1 pg (ref 26.6–33.0)
MCHC: 32.9 g/dL (ref 31.5–35.7)
MCV: 97 fL (ref 79–97)
Platelets: 187 10*3/uL (ref 150–379)
RBC: 4.21 x10E6/uL (ref 4.14–5.80)
RDW: 15.1 % (ref 12.3–15.4)
WBC: 7.9 10*3/uL (ref 3.4–10.8)

## 2017-02-04 LAB — BASIC METABOLIC PANEL
BUN/Creatinine Ratio: 20 (ref 10–24)
BUN: 23 mg/dL (ref 8–27)
CO2: 28 mmol/L (ref 18–29)
Calcium: 9.1 mg/dL (ref 8.6–10.2)
Chloride: 99 mmol/L (ref 96–106)
Creatinine, Ser: 1.16 mg/dL (ref 0.76–1.27)
GFR calc Af Amer: 64 mL/min/{1.73_m2} (ref >59)
GFR calc non Af Amer: 56 mL/min/{1.73_m2} — ABNORMAL LOW (ref >59)
Glucose: 81 mg/dL (ref 65–99)
Potassium: 5.2 mmol/L (ref 3.5–5.2)
Sodium: 140 mmol/L (ref 134–144)

## 2017-02-04 LAB — PRO B NATRIURETIC PEPTIDE: NT-Pro BNP: 6237 pg/mL — ABNORMAL HIGH (ref 0–486)

## 2017-02-04 NOTE — Telephone Encounter (Signed)
I explained to pt it was his middle and lower lobe was collapsed. Pt wants Arthur [name] to find the percentage from the CXR.

## 2017-02-04 NOTE — Telephone Encounter (Signed)
Patient calling, would like to know the results from x-ray. Please call to discuss. Thanks.

## 2017-02-06 ENCOUNTER — Ambulatory Visit (HOSPITAL_COMMUNITY)
Admission: RE | Admit: 2017-02-06 | Discharge: 2017-02-06 | Disposition: A | Discharge status: Home / Self Care | Payer: Medicare Other | Source: Ambulatory Visit | Attending: General Surgery | Admitting: General Surgery

## 2017-02-06 ENCOUNTER — Ambulatory Visit (HOSPITAL_COMMUNITY)
Admission: RE | Admit: 2017-02-06 | Discharge: 2017-02-06 | Disposition: A | Discharge status: Home / Self Care | Payer: Medicare Other | Source: Ambulatory Visit | Attending: Nurse Practitioner | Admitting: Nurse Practitioner

## 2017-02-06 DIAGNOSIS — J948 Other specified pleural conditions: Secondary | ICD-10-CM | POA: Insufficient documentation

## 2017-02-06 DIAGNOSIS — R896 Abnormal cytological findings in specimens from other organs, systems and tissues: Secondary | ICD-10-CM | POA: Diagnosis not present

## 2017-02-06 DIAGNOSIS — Z9889 Other specified postprocedural states: Secondary | ICD-10-CM | POA: Diagnosis not present

## 2017-02-06 DIAGNOSIS — I517 Cardiomegaly: Secondary | ICD-10-CM | POA: Insufficient documentation

## 2017-02-06 DIAGNOSIS — I700 Atherosclerosis of aorta: Secondary | ICD-10-CM | POA: Insufficient documentation

## 2017-02-06 DIAGNOSIS — J90 Pleural effusion, not elsewhere classified: Secondary | ICD-10-CM | POA: Diagnosis present

## 2017-02-06 LAB — BODY FLUID CELL COUNT WITH DIFFERENTIAL
Eos, Fluid: 1 %
Lymphs, Fluid: 77 %
Monocyte-Macrophage-Serous Fluid: 20 % — ABNORMAL LOW (ref 50–90)
Neutrophil Count, Fluid: 1 % (ref 0–25)
Total Nucleated Cell Count, Fluid: 546 cu mm (ref 0–1000)

## 2017-02-06 LAB — LACTATE DEHYDROGENASE, PLEURAL OR PERITONEAL FLUID: LD, Fluid: 94 U/L — ABNORMAL HIGH (ref 3–23)

## 2017-02-06 LAB — GRAM STAIN

## 2017-02-06 LAB — PROTEIN, PLEURAL OR PERITONEAL FLUID: Total protein, fluid: <3 g/dL

## 2017-02-06 LAB — AMYLASE, PLEURAL OR PERITONEAL FLUID: Amylase, Fluid: 39 U/L

## 2017-02-06 MED ORDER — LIDOCAINE HCL 1 % IJ SOLN
INTRAMUSCULAR | Status: AC
  Filled 2017-02-06: qty 20

## 2017-02-06 NOTE — Procedures (Signed)
Ultrasound-guided diagnostic and therapeutic right thoracentesis performed yielding 1.8 liters of yellow colored fluid. No immediate complications. Follow-up chest x-ray pending.       Ho Parisi E 1:39 PM 02/06/2017

## 2017-02-07 LAB — ACID FAST SMEAR (AFB, MYCOBACTERIA): Acid Fast Smear: NEGATIVE

## 2017-02-08 DIAGNOSIS — S81809D Unspecified open wound, unspecified lower leg, subsequent encounter: Secondary | ICD-10-CM | POA: Insufficient documentation

## 2017-02-08 NOTE — Assessment & Plan Note (Signed)
stable overall by history and exam, recent data reviewed with pt, and pt to continue medical treatment as before,  to f/u any worsening symptoms or concerns Lab Results  Component Value Date   HGBA1C 5.9 (H) 02/01/2016

## 2017-02-08 NOTE — Assessment & Plan Note (Signed)
Post calves s/p abrasion with further breakdown afterwards by hx, no s/s cellulitis, for wound care f/u in AM

## 2017-02-08 NOTE — Assessment & Plan Note (Addendum)
Stable volume, cont current tx, for release of information to gain further hx

## 2017-02-08 NOTE — Assessment & Plan Note (Signed)
stable overall by history and exam, recent data reviewed with pt, and pt to continue medical treatment as before,  to f/u any worsening symptoms or concerns BP Readings from Last 3 Encounters:  02/06/17 138/72  02/03/17 124/82  02/03/17 136/88

## 2017-02-09 ENCOUNTER — Telehealth: Payer: Self-pay | Admitting: *Deleted

## 2017-02-09 ENCOUNTER — Other Ambulatory Visit: Payer: Self-pay | Admitting: *Deleted

## 2017-02-09 DIAGNOSIS — J90 Pleural effusion, not elsewhere classified: Secondary | ICD-10-CM

## 2017-02-09 NOTE — Telephone Encounter (Signed)
-----   Message from [name], NP sent at 02/07/2017  9:49 AM EDT ----- He needs to get a CXR before coming to see me on Tuesday.    lori

## 2017-02-09 NOTE — Telephone Encounter (Signed)
Patient returning call.    Thanks
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
## 2017-02-09 NOTE — Telephone Encounter (Signed)
Error

## 2017-02-09 NOTE — Telephone Encounter (Signed)
Pt aware if [facility] recommendations.  Will go to [facility] for chest xray before [facility] appointment. Order's placed in system.

## 2017-02-10 ENCOUNTER — Ambulatory Visit (INDEPENDENT_AMBULATORY_CARE_PROVIDER_SITE_OTHER): Payer: Medicare Other | Admitting: Nurse Practitioner

## 2017-02-10 ENCOUNTER — Encounter: Payer: Self-pay | Admitting: Nurse Practitioner

## 2017-02-10 ENCOUNTER — Other Ambulatory Visit: Payer: Self-pay | Admitting: *Deleted

## 2017-02-10 ENCOUNTER — Ambulatory Visit (INDEPENDENT_AMBULATORY_CARE_PROVIDER_SITE_OTHER)
Admission: RE | Admit: 2017-02-10 | Discharge: 2017-02-10 | Disposition: A | Discharge status: Home / Self Care | Payer: Medicare Other | Source: Ambulatory Visit | Attending: Nurse Practitioner | Admitting: Nurse Practitioner

## 2017-02-10 ENCOUNTER — Telehealth: Payer: Self-pay | Admitting: *Deleted

## 2017-02-10 ENCOUNTER — Ambulatory Visit
Admission: RE | Admit: 2017-02-10 | Discharge: 2017-02-10 | Disposition: A | Discharge status: Home / Self Care | Payer: Medicare Other | Source: Ambulatory Visit | Attending: Nurse Practitioner | Admitting: Nurse Practitioner

## 2017-02-10 ENCOUNTER — Telehealth: Payer: Self-pay | Admitting: Internal Medicine

## 2017-02-10 VITALS — BP 130/70 | HR 59 | Ht 74.0 in | Wt 189.0 lb

## 2017-02-10 DIAGNOSIS — K7469 Other cirrhosis of liver: Secondary | ICD-10-CM

## 2017-02-10 DIAGNOSIS — R0602 Shortness of breath: Secondary | ICD-10-CM | POA: Diagnosis not present

## 2017-02-10 DIAGNOSIS — J939 Pneumothorax, unspecified: Secondary | ICD-10-CM | POA: Diagnosis not present

## 2017-02-10 DIAGNOSIS — J90 Pleural effusion, not elsewhere classified: Secondary | ICD-10-CM

## 2017-02-10 NOTE — Addendum Note (Signed)
Addended by: [name] on: 02/10/2017 04:19 PM   Modules accepted: Orders

## 2017-02-10 NOTE — Telephone Encounter (Signed)
Erline Levine called to report stat CT.  [name] looked at report.

## 2017-02-10 NOTE — Patient Instructions (Addendum)
We will be checking the following labs today - BMET and BNP   Medication Instructions:    Continue with your current medicines.     Testing/Procedures To Be Arranged:  CT chest no contrast today  Follow-Up:   See Dr. Servando Snare - later this week - their office [facility]) will be calling you with a time.     Other Special Instructions:   N/A    If you need a refill on your cardiac medications before your next appointment, please call your pharmacy.   Call the [place] office at [phone] if you have any questions, problems or concerns.

## 2017-02-10 NOTE — Telephone Encounter (Signed)
Did do PT eval.  At this time patient and wife is declining PT services.   Only wanted to continue with nursing services.

## 2017-02-11 ENCOUNTER — Telehealth: Payer: Self-pay | Admitting: Nurse Practitioner

## 2017-02-11 ENCOUNTER — Other Ambulatory Visit: Payer: Medicare Other

## 2017-02-11 ENCOUNTER — Other Ambulatory Visit: Payer: Self-pay | Admitting: *Deleted

## 2017-02-11 DIAGNOSIS — I872 Venous insufficiency (chronic) (peripheral): Secondary | ICD-10-CM | POA: Diagnosis not present

## 2017-02-11 LAB — HEPATIC FUNCTION PANEL
ALT: 20 IU/L (ref 0–44)
AST: 30 IU/L (ref 0–40)
Albumin: 3.7 g/dL (ref 3.5–4.7)
Alkaline Phosphatase: 78 IU/L (ref 39–117)
Bilirubin Total: 0.8 mg/dL (ref 0.0–1.2)
Bilirubin, Direct: 0.3 mg/dL (ref 0.00–0.40)
Total Protein: 6.2 g/dL (ref 6.0–8.5)

## 2017-02-11 LAB — BASIC METABOLIC PANEL
BUN/Creatinine Ratio: 18 (ref 10–24)
BUN: 21 mg/dL (ref 8–27)
CO2: 27 mmol/L (ref 18–29)
Calcium: 9 mg/dL (ref 8.6–10.2)
Chloride: 97 mmol/L (ref 96–106)
Creatinine, Ser: 1.16 mg/dL (ref 0.76–1.27)
GFR calc Af Amer: 64 mL/min/{1.73_m2} (ref >59)
GFR calc non Af Amer: 56 mL/min/{1.73_m2} — ABNORMAL LOW (ref >59)
Glucose: 81 mg/dL (ref 65–99)
Potassium: 5.6 mmol/L — ABNORMAL HIGH (ref 3.5–5.2)
Sodium: 141 mmol/L (ref 134–144)

## 2017-02-11 LAB — CULTURE, BODY FLUID W GRAM STAIN -BOTTLE: Culture: NO GROWTH

## 2017-02-11 LAB — PRO B NATRIURETIC PEPTIDE: NT-Pro BNP: 5770 pg/mL — ABNORMAL HIGH (ref 0–486)

## 2017-02-11 MED ORDER — FUROSEMIDE 20 MG PO TABS: 40.0000 mg | ORAL_TABLET | Freq: Every day | ORAL | 3 refills | Status: DC

## 2017-02-11 NOTE — Telephone Encounter (Signed)
Follow Up:      Returning your call from today. 

## 2017-02-12 ENCOUNTER — Institutional Professional Consult (permissible substitution) (INDEPENDENT_AMBULATORY_CARE_PROVIDER_SITE_OTHER): Payer: Medicare Other | Admitting: Cardiothoracic Surgery

## 2017-02-12 ENCOUNTER — Encounter: Payer: Self-pay | Admitting: Cardiothoracic Surgery

## 2017-02-12 ENCOUNTER — Other Ambulatory Visit: Payer: Self-pay | Admitting: *Deleted

## 2017-02-12 VITALS — BP 131/73 | HR 58 | Resp 20 | Ht 74.0 in | Wt 188.0 lb

## 2017-02-12 DIAGNOSIS — J90 Pleural effusion, not elsewhere classified: Secondary | ICD-10-CM

## 2017-02-12 DIAGNOSIS — J948 Other specified pleural conditions: Secondary | ICD-10-CM
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
## 2017-02-13 ENCOUNTER — Encounter (HOSPITAL_COMMUNITY): Payer: Self-pay | Admitting: *Deleted

## 2017-02-16 ENCOUNTER — Ambulatory Visit (HOSPITAL_COMMUNITY)
Admission: RE | Admit: 2017-02-16 | Discharge: 2017-02-16 | Disposition: A | Discharge status: Home / Self Care | Payer: Medicare Other | Source: Ambulatory Visit | Attending: Cardiothoracic Surgery | Admitting: Cardiothoracic Surgery

## 2017-02-16 ENCOUNTER — Ambulatory Visit (HOSPITAL_COMMUNITY): Payer: Medicare Other

## 2017-02-16 ENCOUNTER — Ambulatory Visit (HOSPITAL_COMMUNITY): Payer: Medicare Other | Admitting: Emergency Medicine

## 2017-02-16 ENCOUNTER — Encounter (HOSPITAL_COMMUNITY): Payer: Self-pay | Admitting: Urology

## 2017-02-16 ENCOUNTER — Encounter (HOSPITAL_COMMUNITY)
Admission: RE | Disposition: A | Discharge status: Home / Self Care | Payer: Self-pay | Source: Ambulatory Visit | Attending: Cardiothoracic Surgery

## 2017-02-16 DIAGNOSIS — Z85828 Personal history of other malignant neoplasm of skin: Secondary | ICD-10-CM | POA: Diagnosis not present

## 2017-02-16 DIAGNOSIS — Z7952 Long term (current) use of systemic steroids: Secondary | ICD-10-CM | POA: Diagnosis not present

## 2017-02-16 DIAGNOSIS — I350 Nonrheumatic aortic (valve) stenosis: Secondary | ICD-10-CM | POA: Diagnosis not present

## 2017-02-16 DIAGNOSIS — I4891 Unspecified atrial fibrillation: Secondary | ICD-10-CM | POA: Diagnosis not present

## 2017-02-16 DIAGNOSIS — Z7901 Long term (current) use of anticoagulants: Secondary | ICD-10-CM | POA: Insufficient documentation

## 2017-02-16 DIAGNOSIS — J948 Other specified pleural conditions: Secondary | ICD-10-CM | POA: Diagnosis not present

## 2017-02-16 DIAGNOSIS — Z87891 Personal history of nicotine dependence: Secondary | ICD-10-CM | POA: Insufficient documentation

## 2017-02-16 DIAGNOSIS — J939 Pneumothorax, unspecified: Secondary | ICD-10-CM | POA: Diagnosis not present

## 2017-02-16 DIAGNOSIS — I252 Old myocardial infarction: Secondary | ICD-10-CM | POA: Diagnosis not present

## 2017-02-16 DIAGNOSIS — I5032 Chronic diastolic (congestive) heart failure: Secondary | ICD-10-CM | POA: Diagnosis not present

## 2017-02-16 DIAGNOSIS — I482 Chronic atrial fibrillation: Secondary | ICD-10-CM | POA: Diagnosis not present

## 2017-02-16 DIAGNOSIS — Z79899 Other long term (current) drug therapy: Secondary | ICD-10-CM | POA: Diagnosis not present

## 2017-02-16 DIAGNOSIS — E785 Hyperlipidemia, unspecified: Secondary | ICD-10-CM | POA: Insufficient documentation

## 2017-02-16 DIAGNOSIS — Z8673 Personal history of transient ischemic attack (TIA), and cerebral infarction without residual deficits: Secondary | ICD-10-CM | POA: Diagnosis not present

## 2017-02-16 DIAGNOSIS — J90 Pleural effusion, not elsewhere classified: Secondary | ICD-10-CM | POA: Diagnosis not present

## 2017-02-16 DIAGNOSIS — Z9689 Presence of other specified functional implants: Secondary | ICD-10-CM

## 2017-02-16 DIAGNOSIS — I110 Hypertensive heart disease with heart failure: Secondary | ICD-10-CM | POA: Diagnosis not present

## 2017-02-16 HISTORY — PX: CHEST TUBE INSERTION: SHX231

## 2017-02-16 LAB — CBC
HCT: 41.1 % (ref 39.0–52.0)
Hemoglobin: 13.2 g/dL (ref 13.0–17.0)
MCH: 32.2 pg (ref 26.0–34.0)
MCHC: 32.1 g/dL (ref 30.0–36.0)
MCV: 100.2 fL — ABNORMAL HIGH (ref 78.0–100.0)
Platelets: 220 10*3/uL (ref 150–400)
RBC: 4.1 MIL/uL — ABNORMAL LOW (ref 4.22–5.81)
RDW: 15.4 % (ref 11.5–15.5)
WBC: 9.3 10*3/uL (ref 4.0–10.5)

## 2017-02-16 LAB — COMPREHENSIVE METABOLIC PANEL
ALT: 21 U/L (ref 17–63)
AST: 35 U/L (ref 15–41)
Albumin: 3 g/dL — ABNORMAL LOW (ref 3.5–5.0)
Alkaline Phosphatase: 65 U/L (ref 38–126)
Anion gap: 9 (ref 5–15)
BUN: 22 mg/dL — ABNORMAL HIGH (ref 6–20)
CO2: 29 mmol/L (ref 22–32)
Calcium: 8.7 mg/dL — ABNORMAL LOW (ref 8.9–10.3)
Chloride: 102 mmol/L (ref 101–111)
Creatinine, Ser: 1.38 mg/dL — ABNORMAL HIGH (ref 0.61–1.24)
GFR calc Af Amer: 51 mL/min — ABNORMAL LOW (ref >60)
GFR calc non Af Amer: 44 mL/min — ABNORMAL LOW (ref >60)
Glucose, Bld: 86 mg/dL (ref 65–99)
Potassium: 3.8 mmol/L (ref 3.5–5.1)
Sodium: 140 mmol/L (ref 135–145)
Total Bilirubin: 1 mg/dL (ref 0.3–1.2)
Total Protein: 6.4 g/dL — ABNORMAL LOW (ref 6.5–8.1)

## 2017-02-16 LAB — APTT: aPTT: 29 seconds (ref 24–36)

## 2017-02-16 LAB — PROTIME-INR
INR: 1.08
Prothrombin Time: 14.1 seconds (ref 11.4–15.2)

## 2017-02-16 SURGERY — INSERTION, PLEURAL DRAINAGE CATHETER
Anesthesia: Monitor Anesthesia Care | Laterality: Right

## 2017-02-16 MED ORDER — HYDROMORPHONE HCL 1 MG/ML IJ SOLN: 0.2500 mg | INTRAMUSCULAR | Status: DC | PRN

## 2017-02-16 MED ORDER — LACTATED RINGERS IV SOLN
INTRAVENOUS | Status: DC
Start: 2017-02-16 — End: 2017-02-16
  Administered 2017-02-16: 11:00:00 via INTRAVENOUS

## 2017-02-16 MED ORDER — MIDAZOLAM HCL 2 MG/2ML IJ SOLN
INTRAMUSCULAR | Status: AC
  Filled 2017-02-16: qty 2

## 2017-02-16 MED ORDER — PROPOFOL 500 MG/50ML IV EMUL
INTRAVENOUS | Status: DC | PRN
  Administered 2017-02-16: 25 ug/kg/min via INTRAVENOUS

## 2017-02-16 MED ORDER — 0.9 % SODIUM CHLORIDE (POUR BTL) OPTIME
TOPICAL | Status: DC | PRN
  Administered 2017-02-16: 1000 mL

## 2017-02-16 MED ORDER — LIDOCAINE HCL 1 % IJ SOLN
INTRAMUSCULAR | Status: DC | PRN
  Administered 2017-02-16: 8 mL

## 2017-02-16 MED ORDER — ONDANSETRON HCL 4 MG/2ML IJ SOLN: 4.0000 mg | Freq: Once | INTRAMUSCULAR | Status: DC | PRN

## 2017-02-16 MED ORDER — FENTANYL CITRATE (PF) 100 MCG/2ML IJ SOLN
INTRAMUSCULAR | Status: DC | PRN
Start: 2017-02-16 — End: 2017-02-16
  Administered 2017-02-16: 25 ug via INTRAVENOUS

## 2017-02-16 MED ORDER — PHENYLEPHRINE HCL 10 MG/ML IJ SOLN
INTRAMUSCULAR | Status: DC | PRN
  Administered 2017-02-16: 40 ug via INTRAVENOUS
  Administered 2017-02-16: 80 ug via INTRAVENOUS

## 2017-02-16 MED ORDER — MIDAZOLAM HCL 5 MG/5ML IJ SOLN
INTRAMUSCULAR | Status: DC | PRN
  Administered 2017-02-16: .5 mg via INTRAVENOUS
  Administered 2017-02-16: 0.5 mg via INTRAVENOUS

## 2017-02-16 MED ORDER — METOPROLOL TARTRATE 12.5 MG HALF TABLET
ORAL_TABLET | ORAL | Status: AC
  Administered 2017-02-16: 25 mg via ORAL
  Filled 2017-02-16: qty 2

## 2017-02-16 MED ORDER — VANCOMYCIN HCL IN DEXTROSE 1-5 GM/200ML-% IV SOLN
1000.0000 mg | INTRAVENOUS | Status: AC
  Administered 2017-02-16: 1000 mg via INTRAVENOUS
  Filled 2017-02-16: qty 200

## 2017-02-16 MED ORDER — EPHEDRINE SULFATE 50 MG/ML IJ SOLN
INTRAMUSCULAR | Status: DC | PRN
  Administered 2017-02-16: 10 mg via INTRAVENOUS
  Administered 2017-02-16: 5 mg via INTRAVENOUS
  Administered 2017-02-16: 10 mg via INTRAVENOUS

## 2017-02-16 MED ORDER — MEPERIDINE HCL 25 MG/ML IJ SOLN: 6.2500 mg | INTRAMUSCULAR | Status: DC | PRN

## 2017-02-16 MED ORDER — FENTANYL CITRATE (PF) 250 MCG/5ML IJ SOLN
INTRAMUSCULAR | Status: AC
  Filled 2017-02-16: qty 5

## 2017-02-16 MED ORDER — METOPROLOL TARTRATE 25 MG PO TABS
25.0000 mg | ORAL_TABLET | Freq: Once | ORAL | Status: AC
  Administered 2017-02-16: 25 mg via ORAL
  Filled 2017-02-16: qty 1

## 2017-02-16 MED ORDER — TRAMADOL HCL 50 MG PO TABS: 50.0000 mg | ORAL_TABLET | Freq: Four times a day (QID) | ORAL | 0 refills | Status: DC | PRN

## 2017-02-16 SURGICAL SUPPLY — 27 items
BRUSH SCRUB EZ PLAIN DRY (MISCELLANEOUS) ×4 IMPLANT
CANISTER SUCT 3000ML PPV (MISCELLANEOUS) ×2 IMPLANT
COVER SURGICAL LIGHT HANDLE (MISCELLANEOUS) ×2 IMPLANT
COVER TRANSDUCER ULTRASND GEL (DRAPE) ×2 IMPLANT
DERMABOND ADVANCED (GAUZE/BANDAGES/DRESSINGS) ×1
DERMABOND ADVANCED .7 DNX12 (GAUZE/BANDAGES/DRESSINGS) ×1 IMPLANT
DRAPE C-ARM 42X72 X-RAY (DRAPES) ×2 IMPLANT
DRAPE LAPAROSCOPIC ABDOMINAL (DRAPES) ×2 IMPLANT
GAUZE SPONGE 4X4 12PLY STRL (GAUZE/BANDAGES/DRESSINGS) ×2 IMPLANT
GLOVE BIO SURGEON STRL SZ 6.5 (GLOVE) ×4 IMPLANT
GLOVE BIOGEL M 7.0 STRL (GLOVE) ×4 IMPLANT
GOWN STRL REUS W/ TWL LRG LVL3 (GOWN DISPOSABLE) ×2 IMPLANT
GOWN STRL REUS W/TWL LRG LVL3 (GOWN DISPOSABLE) ×2
KIT BASIN OR (CUSTOM PROCEDURE TRAY) ×2 IMPLANT
KIT PLEURX DRAIN CATH 1000ML (MISCELLANEOUS) ×4 IMPLANT
KIT PLEURX DRAIN CATH 15.5FR (DRAIN) ×2 IMPLANT
KIT ROOM TURNOVER OR (KITS) ×2 IMPLANT
NS IRRIG 1000ML POUR BTL (IV SOLUTION) ×2 IMPLANT
PACK GENERAL/GYN (CUSTOM PROCEDURE TRAY) ×2 IMPLANT
PAD ARMBOARD 7.5X6 YLW CONV (MISCELLANEOUS) ×4 IMPLANT
SET DRAINAGE LINE (MISCELLANEOUS) IMPLANT
SUT ETHILON 3 0 FSL (SUTURE) ×2 IMPLANT
SUT VIC AB 3-0 X1 27 (SUTURE) ×2 IMPLANT
TOWEL OR 17X24 6PK STRL BLUE (TOWEL DISPOSABLE) ×4 IMPLANT
TOWEL OR 17X26 10 PK STRL BLUE (TOWEL DISPOSABLE) IMPLANT
VALVE REPLACEMENT CAP (MISCELLANEOUS) IMPLANT
WATER STERILE IRR 1000ML POUR (IV SOLUTION) ×2 IMPLANT
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
## 2017-02-16 NOTE — Discharge Instructions (Addendum)
Chest Tube Insertion, Adult A chest tube is a thin, flexible tube that is inserted into the space between your lung and your chest wall. You may need a chest tube if you have a collapsed lung from illness or a severe injury. A collapsed lung can be caused by:  An air leak (pneumothorax).  Blood collection (hemothorax).  Fluid buildup from an infection (empyema). The chest tube drains the fluid or air from your lung. It may be attached to a suction device to help with drainage. You will need to stay in the hospital while the chest tube is in place. Tell a health care provider about:  Any allergies you have.  All medicines you are taking, including vitamins, herbs, eye drops, creams, and over-the-counter medicines.  Any problems you or family members have had with anesthetic medicines.  Any blood disorders you have.  Any surgeries you have had.  Any medical conditions you have, including any recent fever or cold symptoms.  Whether you are pregnant or may be pregnant. What are the risks? Generally, this is a safe procedure. However, problems may occur, including:  Bleeding.  Infection.  Allergic reaction to medicines.  Lung damage.  Damage to the blood vessels or nerves near the lung.  Failure of the chest tube to work properly. What happens before the procedure? Staying hydrated  Follow instructions from your health care provider about hydration, which may include:  Up to 2 hours before the procedure - you may continue to drink clear liquids, such as water, clear fruit juice, black coffee, and plain tea. Eating and drinking restrictions  Follow instructions from your health care provider about eating and drinking, which may include:  8 hours before the procedure - stop eating heavy meals or foods such as meat, fried foods, or fatty foods.  6 hours before the procedure - stop eating light meals or foods, such as toast or cereal.  6 hours before the procedure - stop  drinking milk or drinks that contain milk.  2 hours before the procedure - stop drinking clear liquids. Medicines   Ask your health care provider about:  Changing or stopping your regular medicines. This is especially important if you are taking diabetes medicines or blood thinners.  Taking medicines such as aspirin and ibuprofen. These medicines can thin your blood. Do not take these medicines before your procedure if your health care provider instructs you not to. General instructions   You will have a chest X-ray or other imaging studies of the lung.  Plan to have someone take you home from the hospital or clinic.  If you will be going home right after the procedure, plan to have someone with you for 24 hours. What happens during the procedure?  To reduce your risk of infection:  Your health care team will wash or sanitize their hands.  Your skin will be washed with soap.  Hair may be removed from the surgical area.  An IV tube will be inserted into one of your veins.  You will be given one or more of the following:  A medicine to help you relax (sedative).  A medicine to numb the area (local anesthetic).  You may be given antibiotic and pain medicines through the IV tube.  The surgeon will make a small incision in a space between your ribs.  The chest tube will be placed through the incision into the space between the lung and your chest wall.  Stitches (sutures) will be used to close  the incision around the tube.  The chest tube may be attached to a suction device.  The incision site will be covered with an airtight bandage (dressing).  Another chest X-ray will be done to check the position of the tube. The procedure may vary among health care providers and hospitals. What happens after the procedure?  Your blood pressure, heart rate, breathing rate, and blood oxygen level will be monitored until the medicines you were given have worn off.  You may continue  to get pain medicine or antibiotics through the IV tube.  The tube and dressing will be checked regularly.  You will be encouraged to cough and take deep breaths.  You may be given oxygen to breathe.  Chest X-rays will be done to find out if the lung is inflating. After the lung is inflated:  Another chest X-ray may be done.  The chest tube can be removed after the lung remains inflated and you are breathing easily.  A new dressing will be put on.  Do not drive for 24 hours if you were given a sedative. This information is not intended to replace advice given to you by your health care provider. Make sure you discuss any questions you have with your health care provider. Document Released: 02/11/2007 Document Revised: 05/23/2016 Document Reviewed: 04/16/2016 Elsevier Interactive Patient Education  2017 [employer].

## 2017-02-16 NOTE — Transfer of Care (Signed)
Immediate Anesthesia Transfer of Care Note  Patient: Arthur [name]  Procedure(s) Performed: Procedure(s): INSERTION PLEURAL DRAINAGE CATHETER (Right)  Patient Location: PACU  Anesthesia Type:MAC  Level of Consciousness: awake, alert , oriented and patient cooperative  Airway & Oxygen Therapy: Patient Spontanous Breathing and Patient connected to face mask oxygen  Post-op Assessment: Report given to RN and Post -op Vital signs reviewed and stable  Post vital signs: Reviewed and stable  Last Vitals:  Vitals:   02/16/17 1047 02/16/17 1257  BP:    Pulse: 65   Resp:    Temp:  36.1 C    Last Pain:  Vitals:   02/16/17 1022  TempSrc: Oral      Patients Stated Pain Goal: 5 (76/18/48 5927)  Complications: No apparent anesthesia complications

## 2017-02-16 NOTE — Progress Notes (Signed)
Thank you :)

## 2017-02-16 NOTE — Progress Notes (Signed)
Pt given Metoprolol tartrate 25mg  per pt home medical record. After given, pt wife states he takes "Toprol" the "longer acting" one. Medicine verified with patient's pharmacy [facility] and states patient takes Metoprolol (tartrate) 25mg . Dr. Conrad Jeffers notified and patient on heart monitor. HR in 60s. Spoke with family to make sure they verify what he has at home and to clarify which dose he is supposed to take with MD.

## 2017-02-16 NOTE — Brief Op Note (Signed)
      [address]       [place] [address]             [phone]      02/16/2017  12:50 PM  PATIENT:  Arthur [name]  84 y.o. male  PRE-OPERATIVE DIAGNOSIS:  RIGHT PLEURAL EFFUSION- likely heart failure   POST-OPERATIVE DIAGNOSIS:  RIGHT PLEURAL EFFUSION- same   PROCEDURE:  Procedure(s): INSERTION PLEURAL DRAINAGE CATHETER (Right) - Pleurex and drainage of 1650 ml SURGEON:  Surgeon(s) and Role:    * Grace Isaac, MD - Primary   ANESTHESIA:   MAC  EBL:  No intake/output data recorded.  BLOOD ADMINISTERED:none  DRAINS: plurex   LOCAL MEDICATIONS USED:  LIDOCAINE  and Amount: 8 ml  SPECIMEN:  Source of Specimen:  right pleural fluid   DISPOSITION OF SPECIMEN:  culture and path   DICTATION: .Dragon Dictation  PLAN OF CARE: Discharge to home after PACU  PATIENT DISPOSITION:  PACU - hemodynamically stable.   Delay start of Pharmacological VTE agent (>24hrs) due to surgical blood loss or risk of bleeding: not applicable

## 2017-02-16 NOTE — Anesthesia Postprocedure Evaluation (Signed)
Anesthesia Post Note  Patient: Arthur [name]  Procedure(s) Performed: Procedure(s) (LRB): INSERTION PLEURAL DRAINAGE CATHETER (Right)  Patient location during evaluation: PACU Anesthesia Type: MAC Level of consciousness: awake and alert Pain management: pain level controlled Vital Signs Assessment: post-procedure vital signs reviewed and stable Respiratory status: spontaneous breathing, nonlabored ventilation, respiratory function stable and patient connected to nasal cannula oxygen Cardiovascular status: stable and blood pressure returned to baseline Anesthetic complications: no       Last Vitals:  Vitals:   02/16/17 1356 02/16/17 1400  BP: 113/77   Pulse: (!) 48 (!) 52  Resp: 17 (!) 23  Temp: 36.7 C     Last Pain:  Vitals:   02/16/17 1356  TempSrc:   PainSc: 0-No pain                 Katessa Attridge DAVID

## 2017-02-16 NOTE — Anesthesia Preprocedure Evaluation (Signed)
Anesthesia Evaluation  Patient identified by MRN, date of birth, ID band Patient awake    Reviewed: Allergy & Precautions, NPO status , Patient's Chart, lab work & pertinent test results  Airway Mallampati: II  TM Distance: >3 FB Neck ROM: Limited    Dental   Pulmonary former smoker,    Pulmonary exam normal        Cardiovascular hypertension, + CAD, + Past MI, + Cardiac Stents and +CHF  Normal cardiovascular exam+ dysrhythmias Atrial Fibrillation      Neuro/Psych    GI/Hepatic (+) Hepatitis -  Endo/Other    Renal/GU Renal InsufficiencyRenal disease     Musculoskeletal   Abdominal   Peds  Hematology   Anesthesia Other Findings   Reproductive/Obstetrics                             Anesthesia Physical Anesthesia Plan  ASA: III  Anesthesia Plan: MAC   Post-op Pain Management:    Induction: Intravenous  Airway Management Planned: Simple Face Mask  Additional Equipment:   Intra-op Plan:   Post-operative Plan:   Informed Consent: I have reviewed the patients History and Physical, chart, labs and discussed the procedure including the risks, benefits and alternatives for the proposed anesthesia with the patient or authorized representative who has indicated his/her understanding and acceptance.     Plan Discussed with: CRNA and Surgeon  Anesthesia Plan Comments:         Anesthesia Quick Evaluation

## 2017-02-17 ENCOUNTER — Other Ambulatory Visit: Payer: Self-pay | Admitting: Nurse Practitioner

## 2017-02-17 ENCOUNTER — Encounter (HOSPITAL_COMMUNITY): Payer: Self-pay | Admitting: Cardiothoracic Surgery

## 2017-02-17 DIAGNOSIS — T148XXA Other injury of unspecified body region, initial encounter: Secondary | ICD-10-CM

## 2017-02-17 LAB — GRAM STAIN
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
## 2017-02-18 ENCOUNTER — Encounter (HOSPITAL_BASED_OUTPATIENT_CLINIC_OR_DEPARTMENT_OTHER): Payer: Medicare Other | Attending: Surgery

## 2017-02-18 DIAGNOSIS — L97212 Non-pressure chronic ulcer of right calf with fat layer exposed: Secondary | ICD-10-CM | POA: Insufficient documentation

## 2017-02-18 DIAGNOSIS — I890 Lymphedema, not elsewhere classified: Secondary | ICD-10-CM | POA: Diagnosis not present

## 2017-02-18 DIAGNOSIS — Z86718 Personal history of other venous thrombosis and embolism: Secondary | ICD-10-CM | POA: Diagnosis not present

## 2017-02-18 DIAGNOSIS — I252 Old myocardial infarction: Secondary | ICD-10-CM | POA: Diagnosis not present

## 2017-02-18 DIAGNOSIS — I10 Essential (primary) hypertension: Secondary | ICD-10-CM | POA: Diagnosis not present

## 2017-02-18 DIAGNOSIS — I2510 Atherosclerotic heart disease of native coronary artery without angina pectoris: Secondary | ICD-10-CM | POA: Diagnosis not present

## 2017-02-18 DIAGNOSIS — I872 Venous insufficiency (chronic) (peripheral): Secondary | ICD-10-CM | POA: Insufficient documentation

## 2017-02-18 DIAGNOSIS — L97812 Non-pressure chronic ulcer of other part of right lower leg with fat layer exposed: Secondary | ICD-10-CM | POA: Diagnosis present

## 2017-02-19 ENCOUNTER — Ambulatory Visit: Payer: Medicare Other | Admitting: Internal Medicine

## 2017-02-20 ENCOUNTER — Ambulatory Visit (HOSPITAL_COMMUNITY)
Admission: RE | Admit: 2017-02-20 | Discharge: 2017-02-20 | Disposition: A | Discharge status: Home / Self Care | Payer: Medicare Other | Source: Ambulatory Visit | Attending: Cardiology | Admitting: Cardiology

## 2017-02-20 DIAGNOSIS — Z87891 Personal history of nicotine dependence: Secondary | ICD-10-CM | POA: Insufficient documentation

## 2017-02-20 DIAGNOSIS — L97919 Non-pressure chronic ulcer of unspecified part of right lower leg with unspecified severity: Secondary | ICD-10-CM | POA: Diagnosis not present

## 2017-02-20 DIAGNOSIS — E785 Hyperlipidemia, unspecified: Secondary | ICD-10-CM | POA: Insufficient documentation

## 2017-02-20 DIAGNOSIS — T148XXA Other injury of unspecified body region, initial encounter: Secondary | ICD-10-CM

## 2017-02-20 DIAGNOSIS — I10 Essential (primary) hypertension: Secondary | ICD-10-CM | POA: Insufficient documentation

## 2017-02-20 DIAGNOSIS — Z8673 Personal history of transient ischemic attack (TIA), and cerebral infarction without residual deficits: Secondary | ICD-10-CM | POA: Insufficient documentation

## 2017-02-20 DIAGNOSIS — L97929 Non-pressure chronic ulcer of unspecified part of left lower leg with unspecified severity: Secondary | ICD-10-CM | POA: Insufficient documentation

## 2017-02-21 LAB — CULTURE, BODY FLUID W GRAM STAIN -BOTTLE: Culture: NO GROWTH

## 2017-02-25 ENCOUNTER — Telehealth: Payer: Self-pay | Admitting: Nurse Practitioner

## 2017-02-25 DIAGNOSIS — L97812 Non-pressure chronic ulcer of other part of right lower leg with fat layer exposed: Secondary | ICD-10-CM | POA: Diagnosis not present

## 2017-02-25 NOTE — Telephone Encounter (Signed)
s/w pt was seen in wound center today, had dopplers on both lower legs.   Dr. Kalman Drape stated to pt would not give results till consulted with a cardiologist.  Pt would like either, Truitt Merle, NP, Dr. Servando Snare, or Dr. Burt Knack to call Dr. Kalman Drape at  [phone] so pt can get results.  S/w Theodosia Quay, RN stated pt has consult Tuesday, April 17 with Dr. Fletcher Anon, most likely test results will be discussed than.  [name] is aware.

## 2017-02-25 NOTE — Telephone Encounter (Signed)
New message    Pt is calling asking to be called back. He said it is complicated

## 2017-02-27 ENCOUNTER — Telehealth: Payer: Self-pay

## 2017-02-27 ENCOUNTER — Telehealth: Payer: Self-pay | Admitting: Cardiovascular Disease

## 2017-02-27 NOTE — Telephone Encounter (Signed)
Received phone call from patient he stated he wanted to know if Dr.Bretta has spoke to Truitt Merle NP about his problem.Advised he needs to keep appointment with Dr.Arida 03/03/17 at 10:40 am at [facility] office.

## 2017-02-27 NOTE — Telephone Encounter (Signed)
Returned call to patient advised to keep appointment with Dr.Arida 03/03/17 at 10:40 am.

## 2017-02-27 NOTE — Telephone Encounter (Signed)
Patient states that his wound doctor is consulting with his cardiologist to determine plan for treatment and patient would like to know if its necessary for him to see Dr. Fletcher Anon on 03-03-17. Please call to discuss,thanks.

## 2017-03-03 ENCOUNTER — Telehealth: Payer: Self-pay | Admitting: Cardiovascular Disease

## 2017-03-03 ENCOUNTER — Encounter: Payer: Self-pay | Admitting: Cardiovascular Disease

## 2017-03-03 ENCOUNTER — Ambulatory Visit (INDEPENDENT_AMBULATORY_CARE_PROVIDER_SITE_OTHER): Payer: Medicare Other | Admitting: Cardiovascular Disease

## 2017-03-03 VITALS — BP 138/72 | HR 75 | Ht 74.0 in | Wt 184.0 lb

## 2017-03-03 DIAGNOSIS — R600 Localized edema: Secondary | ICD-10-CM | POA: Diagnosis not present

## 2017-03-03 DIAGNOSIS — I739 Peripheral vascular disease, unspecified: Secondary | ICD-10-CM

## 2017-03-03 DIAGNOSIS — I872 Venous insufficiency (chronic) (peripheral): Secondary | ICD-10-CM

## 2017-03-03 NOTE — Telephone Encounter (Signed)
Agree 
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
## 2017-03-03 NOTE — Telephone Encounter (Signed)
I spoke with the pt's daughter in regards to furosemide. Pt was seen by Dr Fletcher Anon today and after his visit he questioned who increased his Furosemide to 40mg  daily.  I made him aware that this change was made on 02/11/17 by Truitt Merle NP based on lab findings.  At 02/10/17 visit the pt was taking Furosemide 20mg  every other day and when labs resulted [name] advised to increase to 40mg  daily due to elevated lab levels. The pt was contacted by phone with these instructions but as of today did not make this change.  The pt continues to have issues with edema and drainage from pleurX catheter and I advised that he proceed with these instructions as previously recommended.  The pt will begin this regimen tomorrow.  The daughter is aware of these instructions too.

## 2017-03-03 NOTE — Telephone Encounter (Signed)
New message    Pt daughter is calling to find out about pt medication.  Pt c/o medication issue:  1. Name of Medication: furosemide 20 mg  2. How are you currently taking this medication (dosage and times per day)? 20 mg every other day  3. Are you having a reaction (difficulty breathing--STAT)? no  4. What is your medication issue? Pt daughter states that pt saw Dr. Fletcher Anon today and the medication was increased to 40 mg once a day. Daughter is calling to find out if it should of been increased.    Pt daughter is asking that when you call if you could leave a voicemail. She has a conference call for the next 2 hours.

## 2017-03-03 NOTE — Patient Instructions (Signed)

## 2017-03-04 DIAGNOSIS — L97812 Non-pressure chronic ulcer of other part of right lower leg with fat layer exposed: Secondary | ICD-10-CM | POA: Diagnosis not present

## 2017-03-06 ENCOUNTER — Telehealth: Payer: Self-pay | Admitting: Internal Medicine

## 2017-03-06 LAB — FUNGUS CULTURE RESULT

## 2017-03-06 LAB — FUNGUS CULTURE WITH STAIN

## 2017-03-06 LAB — FUNGAL ORGANISM REFLEX

## 2017-03-06 NOTE — Telephone Encounter (Signed)
ROI faxed to [facility] [phone] requesting records. PWR 03/06/16

## 2017-03-11 ENCOUNTER — Other Ambulatory Visit: Payer: Self-pay | Admitting: Cardiothoracic Surgery

## 2017-03-11 DIAGNOSIS — I712 Thoracic aortic aneurysm, without rupture, unspecified: Secondary | ICD-10-CM

## 2017-03-11 DIAGNOSIS — L97812 Non-pressure chronic ulcer of other part of right lower leg with fat layer exposed: Secondary | ICD-10-CM | POA: Diagnosis not present

## 2017-03-12 ENCOUNTER — Encounter: Payer: Self-pay | Admitting: Cardiothoracic Surgery

## 2017-03-12 ENCOUNTER — Ambulatory Visit (INDEPENDENT_AMBULATORY_CARE_PROVIDER_SITE_OTHER): Payer: Medicare Other | Admitting: Cardiothoracic Surgery

## 2017-03-12 ENCOUNTER — Ambulatory Visit
Admission: RE | Admit: 2017-03-12 | Discharge: 2017-03-12 | Disposition: A | Discharge status: Home / Self Care | Payer: Medicare Other | Source: Ambulatory Visit | Attending: Cardiothoracic Surgery | Admitting: Cardiothoracic Surgery

## 2017-03-12 VITALS — BP 123/82 | HR 56 | Resp 20 | Ht 74.0 in | Wt 184.0 lb

## 2017-03-12 DIAGNOSIS — J90 Pleural effusion, not elsewhere classified: Secondary | ICD-10-CM | POA: Diagnosis not present

## 2017-03-12 DIAGNOSIS — I712 Thoracic aortic aneurysm, without rupture, unspecified: Secondary | ICD-10-CM

## 2017-03-12 NOTE — Progress Notes (Signed)
[address]       [place], [address]             [phone]      Arthur [name] [place] Medical Record #[id number] Date of Birth: [birth year]  Referring: Sherren Mocha, MD Primary Care: [name], MD  Chief Complaint:   POST OP FOLLOW UP 02/16/2017 OPERATIVE REPORT PREOPERATIVE DIAGNOSES:  Recurrent right pleural effusion with hydropneumothorax. POSTOPERATIVE DIAGNOSES:  Recurrent right pleural effusion with hydropneumothorax. SURGICAL PROCEDURE:  Placement of right PleurX catheter and drainage of right pleural space with fluoroscopic and ultrasound guidance. SURGEON:  Lanelle Bal, MD  History of Present Illness:     Overall the patient feels better with less shortness of breath since placement of Pleurx catheter. His drainage is decreased  average and about 150 mL per day of straw-colored fluid.     Past Medical History:  Diagnosis Date  . Arrhythmia    1995  . Atrial fibrillation ([facility])   . CHF (congestive heart failure) ([place]) 10/13/2016  . Coronary artery disease    MI, atherectomy 1993  . DDD (degenerative disc disease)   . Eczema   . Elevated LFTs   . History of gallstones   . History of transient ischemic attack (TIA) 2005   double vision  . Hyperlipidemia   . Hypertension   . Myocardial infarct ([place]) 12/98   inferior  . Pneumonia    long time ago  . Skin cancer   . Stroke ([facility] [place])   . Varicose veins      History  Smoking Status  . Former Smoker  . Years: 7.00  . Types: Cigars, Pipe  . Quit date: 11/18/1967  Smokeless Tobacco  . Never Used    Comment: 1-2 cigars a day, pipe    History  Alcohol Use  . 0.6 oz/week  . 1 Glasses of wine per week    Comment: 2-3 glasses of wine in the evening     Allergies  Allergen Reactions  . Penicillins Hives    Current Outpatient Prescriptions  Medication Sig Dispense Refill  . apixaban (ELIQUIS) 5 MG TABS tablet Take 1 tablet (5 mg total) by mouth 2 (two) times daily.  60 tablet 2  . furosemide (LASIX) 20 MG tablet Take 2 tablets (40 mg total) by mouth daily. (Patient taking differently: Take 20 mg by mouth every other day. ) 45 tablet 3  . metoprolol tartrate (LOPRESSOR) 25 MG tablet Take 25 mg by mouth daily.     . predniSONE (DELTASONE) 5 MG tablet Take 5 mg by mouth daily.     No current facility-administered medications for this visit.        Physical Exam: BP 123/82   Pulse (!) 56   Resp 20   Ht 6\' 2"  (1.88 m)   Wt 184 lb (83.5 kg)   SpO2 93% Comment: RA  BMI 23.62 kg/m   General appearance: alert and cooperative Neurologic: intact Heart: irregularly irregular rhythm Lungs: diminished breath sounds bibasilar Abdomen: soft, non-tender; bowel sounds normal; no masses,  no organomegaly Extremities: Patient's lower extremities are both wrapped in dressings from his chronic saphenous venous stasis disease. Wound: Pleurx catheter is intact right chest   Diagnostic Studies & Laboratory data:     Recent Radiology Findings:   Dg Chest 2 View  Result Date: 03/12/2017 CLINICAL DATA:  Follow-up thoracic aortic aneurysm.  Chest tube. EXAM: CHEST  2 VIEW COMPARISON:  02/16/2017 FINDINGS: Right chest  tube remains in place. Small right pleural effusion. Resolution of the previously seen moderate-sized pneumothorax. Right lower lobe atelectasis noted. Mild cardiomegaly. No focal opacity on the left. IMPRESSION: Right chest tube remains in place with small right pleural effusion. No visible pneumothorax currently. Right base atelectasis. Cardiomegaly. Electronically Signed   By: Rolm Baptise M.D.   On: 03/12/2017 12:37      Recent Lab Findings: Lab Results  Component Value Date   WBC 9.3 02/16/2017   HGB 13.2 02/16/2017   HCT 41.1 02/16/2017   PLT 220 02/16/2017   GLUCOSE 86 02/16/2017   CHOL 192 08/18/2016   TRIG 93.0 08/18/2016   HDL 58.10 08/18/2016   LDLCALC 115 (H) 08/18/2016   ALT 21 02/16/2017   AST 35 02/16/2017   NA 140 02/16/2017     K 3.8 02/16/2017   CL 102 02/16/2017   CREATININE 1.38 (H) 02/16/2017   BUN 22 (H) 02/16/2017   CO2 29 02/16/2017   TSH 1.45 08/18/2016   INR 1.08 02/16/2017   HGBA1C 5.9 (H) 02/01/2016   Catheter drained in the office today approximately 200 mL straw-colored fluid   Assessment / Plan:      Functioning Pleurx catheter controlling right pleural effusion, resolution of right pneumothorax. We'll convert to draining Pleurx catheter every other day, until less than 150 mL 3 with each drainage then convert to Mondays and Thursday drainage. Plan see him back in 4 weeks with follow-up chest x-ray.    [name]      [address] [place] [address] Office [phone]   Beeper [phone]  03/12/2017 1:06 PM

## 2017-03-13 ENCOUNTER — Other Ambulatory Visit: Payer: Self-pay | Admitting: *Deleted

## 2017-03-18 ENCOUNTER — Encounter (HOSPITAL_BASED_OUTPATIENT_CLINIC_OR_DEPARTMENT_OTHER): Payer: Medicare Other | Attending: Surgery

## 2017-03-18 DIAGNOSIS — I872 Venous insufficiency (chronic) (peripheral): Secondary | ICD-10-CM | POA: Diagnosis not present

## 2017-03-18 DIAGNOSIS — L97822 Non-pressure chronic ulcer of other part of left lower leg with fat layer exposed: Secondary | ICD-10-CM | POA: Insufficient documentation

## 2017-03-18 DIAGNOSIS — I890 Lymphedema, not elsewhere classified: Secondary | ICD-10-CM | POA: Diagnosis not present

## 2017-03-18 DIAGNOSIS — I509 Heart failure, unspecified: Secondary | ICD-10-CM | POA: Diagnosis not present

## 2017-03-18 DIAGNOSIS — L97812 Non-pressure chronic ulcer of other part of right lower leg with fat layer exposed: Secondary | ICD-10-CM | POA: Diagnosis not present

## 2017-03-18 DIAGNOSIS — I739 Peripheral vascular disease, unspecified: Secondary | ICD-10-CM | POA: Diagnosis not present

## 2017-03-18 DIAGNOSIS — Z86718 Personal history of other venous thrombosis and embolism: Secondary | ICD-10-CM | POA: Insufficient documentation

## 2017-03-18 DIAGNOSIS — I110 Hypertensive heart disease with heart failure: Secondary | ICD-10-CM | POA: Diagnosis not present

## 2017-03-18 DIAGNOSIS — I252 Old myocardial infarction: Secondary | ICD-10-CM | POA: Diagnosis not present

## 2017-03-19 DIAGNOSIS — L97812 Non-pressure chronic ulcer of other part of right lower leg with fat layer exposed: Secondary | ICD-10-CM | POA: Diagnosis not present

## 2017-03-20 ENCOUNTER — Other Ambulatory Visit: Payer: Self-pay | Admitting: Cardiovascular Disease

## 2017-03-20 MED ORDER — FUROSEMIDE 20 MG PO TABS: 40.0000 mg | ORAL_TABLET | Freq: Every day | ORAL | 11 refills | Status: DC

## 2017-03-24 ENCOUNTER — Other Ambulatory Visit: Payer: Self-pay | Admitting: Nurse Practitioner

## 2017-03-24 DIAGNOSIS — S81809A Unspecified open wound, unspecified lower leg, initial encounter: Secondary | ICD-10-CM

## 2017-03-25 ENCOUNTER — Encounter (HOSPITAL_COMMUNITY): Payer: Medicare Other

## 2017-03-25 ENCOUNTER — Inpatient Hospital Stay (HOSPITAL_COMMUNITY): Admission: RE | Admit: 2017-03-25 | Payer: Medicare Other | Source: Ambulatory Visit

## 2017-03-25 DIAGNOSIS — L97812 Non-pressure chronic ulcer of other part of right lower leg with fat layer exposed: Secondary | ICD-10-CM | POA: Diagnosis not present

## 2017-04-01 DIAGNOSIS — L97812 Non-pressure chronic ulcer of other part of right lower leg with fat layer exposed: Secondary | ICD-10-CM | POA: Diagnosis not present

## 2017-04-07 ENCOUNTER — Other Ambulatory Visit: Payer: Self-pay | Admitting: Internal Medicine

## 2017-04-07 DIAGNOSIS — L97812 Non-pressure chronic ulcer of other part of right lower leg with fat layer exposed: Secondary | ICD-10-CM | POA: Diagnosis not present

## 2017-04-14 ENCOUNTER — Encounter: Payer: Self-pay | Admitting: Neurology

## 2017-04-14 ENCOUNTER — Ambulatory Visit (INDEPENDENT_AMBULATORY_CARE_PROVIDER_SITE_OTHER): Payer: Medicare Other | Admitting: Neurology

## 2017-04-14 VITALS — BP 112/65 | HR 57 | Wt 185.4 lb

## 2017-04-14 DIAGNOSIS — I63412 Cerebral infarction due to embolism of left middle cerebral artery: Secondary | ICD-10-CM

## 2017-04-14 DIAGNOSIS — J90 Pleural effusion, not elsewhere classified: Secondary | ICD-10-CM | POA: Diagnosis not present

## 2017-04-14 DIAGNOSIS — I712 Thoracic aortic aneurysm, without rupture: Secondary | ICD-10-CM

## 2017-04-14 DIAGNOSIS — I480 Paroxysmal atrial fibrillation: Secondary | ICD-10-CM

## 2017-04-14 DIAGNOSIS — I7121 Aneurysm of the ascending aorta, without rupture: Secondary | ICD-10-CM

## 2017-04-14 DIAGNOSIS — Z7901 Long term (current) use of anticoagulants: Secondary | ICD-10-CM

## 2017-04-14 NOTE — Progress Notes (Signed)
STROKE NEUROLOGY FOLLOW UP NOTE  NAME: Arthur [name] DOB: 14-Oct-1925  REASON FOR VISIT: stroke follow up HISTORY FROM: pt and chart  Today we had the pleasure of seeing Arthur [name] in follow-up at our Neurology Clinic. Pt was accompanied by no one.   History Summary Arthur [name] is a 83 y.o. male with history of CAD, HLD, afib on coumadin, TIA, HTN, and previous MI, was admitted on 01/31/16 for dysarthria and right face and arm weakness. His coumadin was on hold due to LE vascular procedure. He received IV TPA. MRI showed acute left MCA small infarcts and chronic b/l cerebellar infarcts. MRA head questionable right M2 branch occlusion and right PICA occlusion. Coumadin resumed. EEG and CUS unremarkable. TTE EF 55-60%, but concerning for dilated aortic root. Therefore CTA chest aorta performed and showed unchanged ascending aortic aneurysm, but also found to have small thrombus in the LAA. Therefore, coumadin was changed to eliquis (coumadin only had on dose). He was discharged with eliquis and zetia due to elevated LFTs.   03/21/16 follow up - the patient has been doing well. No recurrent stroke like symptoms. Only complains of easy fatigue. Denies s/s of OSA. Discontinued zetia by PCP. Currently on eliquis tolerating well. BP 108/60.    07/08/16 follow up - pt has been doing well. Had recent right cheek squamous cell carcinoma removal. No need to stop eliquis for skin procedure. Had recent CHF and on "fluid pills" and lost 9-10lbs. About 4 weeks ago, pt was driving and had sudden onset left vision loss, lasted about 30 sec and resolved. No weakness or numbness, no HA, or migraine Hx. BP 131/90. On eliquis without side effects.   10/13/16 follow up - pt has been doing well from stroke standpoint. No more amaurosis fugax episode. Had CTA neck in 07/2016 showed no significant ICA stenosis to explain her amaurosis fugax in 05/2016. Her repeat CTA chest also showed resolution of LAA thrombus.  He had SOB while having vacation in [place], [place] and was put on lasix and s/p thoracentesis. He followed with cardiology in 07/2017 and continued on lasix and beta blocker. He still complains of tiredness daily. During visit, he has mild SOB. BP today 139/93   Interval History During the interval time, pt has been doing well from stroke standpoint. However, he passed out in bathtub in 01/2017 when he was in [place]. He was hospitalized and found to have pleural effusion s/p thoracentesis. He followed with Dr. Servando Snare in 02/2017 and put in pleurx catheter for continued drainage. Pt stated the drainage is less and less. He has appointment with Dr. Servando Snare in 2 days. Cre stable, last check one month ago was 1.38. He is on eliquis and lasix and metoprolol. BP today 112/65  REVIEW OF SYSTEMS: Full 14 system review of systems performed and notable only for those listed below and in HPI above, all others are negative:  Constitutional:   Cardiovascular:  Ear/Nose/Throat:   Skin:  Eyes:   Respiratory:  SOB Gastroitestinal:   Genitourinary: frequent urination Hematology/Lymphatic:   Endocrine:  Musculoskeletal:   Allergy/Immunology:   Neurological:   Psychiatric:  Sleep:   The following represents the patient's updated allergies and side effects list: Allergies  Allergen Reactions  . Penicillins Hives    The neurologically relevant items on the patient's problem list were reviewed on today's visit.  Neurologic Examination  A problem focused neurological exam (12 or more points of the single system neurologic examination,  vital signs counts as 1 point, cranial nerves count for 8 points) was performed.  Blood pressure 112/65, pulse (!) 57, weight 185 lb 6.4 oz (84.1 kg).  General - Well nourished, well developed, in no apparent distress.  Ophthalmologic - Sharp disc margins OU. Fundi not visualized due to .  Extremities - b/l LE wound with dressing, dry clean  Cardiovascular -  Regular rate and rhythm with no murmur, no afib rhythm. Mild SOB and tachypnea.   Mental Status -  Level of arousal and orientation to time, place, and person were intact. Language including expression, naming, repetition, comprehension was assessed and found intact. Fund of Knowledge was assessed and was intact.  Cranial Nerves II - XII - II - Visual field intact OU. III, IV, VI - Extraocular movements intact. V - Facial sensation intact bilaterally. VII - Facial movement intact bilaterally. VIII - Hearing & vestibular intact bilaterally. X - Palate elevates symmetrically. XI - Chin turning & shoulder shrug intact bilaterally. XII - Tongue protrusion intact.  Motor Strength - The patient's strength was normal in all extremities and pronator drift was absent.  Bulk was normal and fasciculations were absent.   Motor Tone - Muscle tone was assessed at the neck and appendages and was normal.  Reflexes - The patient's reflexes were 1+ in all extremities and he had no pathological reflexes.  Sensory - Light touch, temperature/pinprick were assessed and were normal.    Coordination - The patient had normal movements in the hands with no ataxia or dysmetria.  Tremor was absent.  Gait and Station - The patient's transfers, posture, gait, station, and turns were observed as normal.   Data reviewed: I personally reviewed the images and agree with the radiology interpretations.  Ct Head Wo Contrast 01/31/2016  1. No acute findings. No intracranial hemorrhage or edema.  2. 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  3. Meningioma overlying the left occipital lobe, stable in size, now partially calcified.   EEG - This awake and drowsy EEG is within normal limits for age. A normal EEG does not exclude a clinical diagnosis of epilepsy. Clinical correlation is advised.  CUS - Bilateral: 1-39% ICA stenosis. Vertebral artery flow is antegrade.  2D echo -  Left ventricle: The cavity size was normal. Wall thickness was  increased in a pattern of mild LVH. There was mild focal basal  hypertrophy of the septum. Systolic function was normal. The  estimated ejection fraction was in the range of 55% to 60%. Wall  motion was normal; there were no regional wall motion  abnormalities. - Aortic valve: Valve mobility was restricted. There was mild  stenosis. There was mild regurgitation. - Aortic root: The aortic root was mildly dilated. - Ascending aorta: The ascending aorta was mildly dilated. - Mitral valve: Calcified annulus. There was mild regurgitation. - Left atrium: The atrium was severely dilated. - Right atrium: The atrium was moderately dilated. - Pulmonary arteries: Systolic pressure was mildly increased. PA  peak pressure: 36 mm Hg (S). Impressions: - Normal LV systolic function; biatrial enlargement; calcified  aortic valve with mild AS (mean gradient 17 mmHg); mild AI; mild  MR; trace TR with mildly elevated pulmonary pressure; mildly  dilated aortic root, ascending aorta and aortic arch; suggest CTA   or MRA to further assess.  CT Angio Chest Aorta with and without CM 02/02/2016 1. Biatrial enlargement with a small thrombus in the left atrial appendage. 2. No increase in size of the previously  seen ascending thoracic aortic aneurysm, currently measuring 4.5 cm in maximum diameter. Recommend semi-annual imaging followup by CTA or MRA and referral to cardiothoracic surgery if not already obtained. This recommendation follows 2010 ACCF/AHA/AATS/ACR/ASA/SCA/SCAI/SIR/STS/SVM Guidelines for the Diagnosis and Management of Patients With Thoracic Aortic Disease. Circulation. 2010; 121: J188-C166 3. Small to moderate-sized right pleural effusion and small left pleural effusion. 4. Bilateral dependent atelectasis, greater on the right. 5. Dense atheromatous coronary artery calcifications.   MRI Head 02/02/2016 1. Patchy small  volume acute ischemic nonhemorrhagic left MCA territory infarcts as above. 2. Remote bilateral cerebellar infarcts, right larger than left. 3. Moderate generalized age-related cerebral atrophy. 4. 2.1 cm left parietal convexity meningioma. No associated edema.  MRA Head  02/02/2016 1. No definite large or proximal arterial branch occlusion within the intracranial circulation. 2. Question proximal right M2 branch occlusion, not entirely certain given the absence of acute or chronic infarct within this territory. Finding may be related to motion artifact on this exam. 3. Probable proximal nonocclusive moderate M2 stenoses bilaterally. 4. Diminutive vertebrobasilar system with fetal origin of the right PCA. Left PCA also largely supplied via a prominent left posterior communicating artery. 5. Nonvisualization of the right PICA, which may be occluded given the remote right cerebellar infarct.  CTA chest 07/23/16 1. Stable ascending thoracic aorta measuring 43 mm in maximum dimension. Recommend annual imaging followup by CTA or MRA. This recommendation follows 2010 ACCF/AHA/AATS/ACR/ASA/SCA/SCAI/SIR/STS/SVM Guidelines for the Diagnosis and Management of Patients with Thoracic Aortic Disease. Circulation. 2010; 121: A630-Z601 2. Coronary artery calcification and aortic atherosclerotic calcification. 3. Chronic RIGHT effusion with basilar atelectasis.  CTA neck 07/23/16 1. Extensive atherosclerotic calcifications within the common carotid arteries bilaterally at both carotid bifurcations, and at the aortic arch without focal stenosis. 2. Tortuosity of the cervical internal carotid arteries bilaterally without significant stenosis. 3. No acute or focal lesion to account for the patient's left-sided amaurosis fugax. 4. Multilevel spondylosis of the cervical spine.  Component     Latest Ref Rng 02/01/2016  Cholesterol     0 - 200 mg/dL 161  Triglycerides     <150 mg/dL 58  HDL  Cholesterol     >40 mg/dL 46  Total CHOL/HDL Ratio      3.5  VLDL     0 - 40 mg/dL 12  LDL (calc)     0 - 99 mg/dL 103 (H)  Hemoglobin A1C     4.8 - 5.6 % 5.9 (H)  Mean Plasma Glucose      123   Component     Latest Ref Rng & Units 08/18/2016  Cholesterol     0 - 200 mg/dL 192  Triglycerides     0.0 - 149.0 mg/dL 93.0  HDL Cholesterol     >39.00 mg/dL 58.10  VLDL     0.0 - 40.0 mg/dL 18.6  LDL (calc)     0 - 99 mg/dL 115 (H)  Total CHOL/HDL Ratio      3  NonHDL      133.52  Total Bilirubin     0.2 - 1.2 mg/dL 1.0  Bilirubin, Direct     0.0 - 0.3 mg/dL 0.2  Alkaline Phosphatase     39 - 117 U/L 66  AST     0 - 37 U/L 44 (H)  ALT     0 - 53 U/L 42  Total Protein     6.0 - 8.3 g/dL 6.7  Albumin     3.5 - 5.2 g/dL  3.4 (L)  TSH     0.35 - 4.50 uIU/mL 1.45    Assessment: As you may recall, he is a 83 y.o. Caucasian male with PMH of CAD, HLD, afib on coumadin, TIA, HTN, and previous MI who was admitted on 01/31/16 for dysarthria and right face and arm weakness. His coumadin was on hold due to LE vascular procedure. He received IV TPA. MRI showed acute left MCA small infarcts and chronic b/l cerebellar infarcts. MRA head questionable right M2 branch occlusion and right PICA occlusion. Coumadin resumed. EEG and CUS unremarkable. TTE EF 55-60%, but concerning for dilated aortic root. CTA chest aorta showed unchanged ascending aortic aneurysm, but also found to have small thrombus in the LAA. Therefore, coumadin was changed to eliquis (coumadin only had one dose). He was discharged with eliquis and zetia due to elevated LFTs secondary to autoimmune hepatitis treated with prednisone. Later zetia was discontinued by PCP. Pt had left eye amaurosis fugax in 05/2016 that lasted 30 sec. Had CTA neck showed no significant ICA stenosis and repeat CTA chest showed resolution of LAA clot. Repeat LDL 115, repeat ALT 42 and AST 44. Has CHF in 07/2014 in [facility] s/p thoracentesis. Had syncope in [place] in  01/2017 s/p thoracentesis. Follows with cardiology and CVTS, now has Plurex catheter for continued drainage. Tolerating eliquis well. Will hold off statin due to autoimmune hepatitis. Last Cre 1.38  Plan:  - continue eliquis for stroke prevention (last Cre 1.38) - check BP at home - Follow up with primary care physician for stroke risk factor modification. Recommend maintain blood pressure goal <130/80, diabetes with hemoglobin A1c goal below 6.5% and lipids with LDL cholesterol goal below 70 mg/dL.  - follow up with Dr. Servando Snare for pleural effusion.  - follow up with Cardiology and wound care closely  - salt free diet and home exercise - follow up as needed.  I spent more than 25 minutes of face to face time with the patient. Greater than 50% of time was spent in counseling and coordination of care. We discussed compliance with eliquis, and follow up with Dr. Servando Snare and cardiology.    No orders of the defined types were placed in this encounter.   No orders of the defined types were placed in this encounter.   Patient Instructions  - continue eliquis for stroke prevention - check BP at home - Follow up with primary care physician for stroke risk factor modification. Recommend maintain blood pressure goal <130/80, diabetes with hemoglobin A1c goal below 6.5% and lipids with LDL cholesterol goal below 70 mg/dL.  - follow up with Dr. Servando Snare for pleural effusion.  - follow up with Cardiology and wound care closely  - salt free diet and home exercise - follow up as needed.   Rosalin Hawking, MD PhD [facility] [address] [place], [place] [address] [phone]

## 2017-04-14 NOTE — Patient Instructions (Addendum)
-   continue eliquis for stroke prevention - check BP at home - Follow up with primary care physician for stroke risk factor modification. Recommend maintain blood pressure goal <130/80, diabetes with hemoglobin A1c goal below 6.5% and lipids with LDL cholesterol goal below 70 mg/dL.  - follow up with Dr. Servando Snare for pleural effusion.  - follow up with Cardiology and wound care closely  - salt free diet and home exercise - follow up as needed.

## 2017-04-15 ENCOUNTER — Other Ambulatory Visit: Payer: Self-pay | Admitting: Cardiothoracic Surgery

## 2017-04-15 DIAGNOSIS — I712 Thoracic aortic aneurysm, without rupture, unspecified: Secondary | ICD-10-CM

## 2017-04-15 DIAGNOSIS — L97812 Non-pressure chronic ulcer of other part of right lower leg with fat layer exposed: Secondary | ICD-10-CM | POA: Diagnosis not present

## 2017-04-16 ENCOUNTER — Ambulatory Visit
Admission: RE | Admit: 2017-04-16 | Discharge: 2017-04-16 | Disposition: A | Discharge status: Home / Self Care | Payer: Medicare Other | Source: Ambulatory Visit | Attending: Cardiothoracic Surgery | Admitting: Cardiothoracic Surgery

## 2017-04-16 ENCOUNTER — Ambulatory Visit (INDEPENDENT_AMBULATORY_CARE_PROVIDER_SITE_OTHER): Payer: Medicare Other | Admitting: Cardiothoracic Surgery

## 2017-04-16 ENCOUNTER — Encounter: Payer: Self-pay | Admitting: Cardiothoracic Surgery

## 2017-04-16 VITALS — BP 135/78 | HR 69 | Resp 20 | Ht 74.0 in | Wt 185.0 lb

## 2017-04-16 DIAGNOSIS — J90 Pleural effusion, not elsewhere classified: Secondary | ICD-10-CM | POA: Diagnosis not present

## 2017-04-16 DIAGNOSIS — J948 Other specified pleural conditions: Secondary | ICD-10-CM

## 2017-04-16 DIAGNOSIS — I712 Thoracic aortic aneurysm, without rupture, unspecified: Secondary | ICD-10-CM

## 2017-04-16 NOTE — Progress Notes (Signed)
[address]       [place] [address]             [phone]      Hebert G Kot Hutto Medical Record #[id number] Date of Birth: 1924-12-16  Referring: Sherren Mocha, MD Primary Care: Biagio Borg, MD  Chief Complaint:   POST OP FOLLOW UP 02/16/2017 OPERATIVE REPORT PREOPERATIVE DIAGNOSES: Recurrent right pleural effusion with hydropneumothorax. POSTOPERATIVE DIAGNOSES: Recurrent right pleural effusion with hydropneumothorax. SURGICAL PROCEDURE: Placement of right PleurX catheter and drainage of right pleural space with fluoroscopic and ultrasound guidance. SURGEON: Lanelle Bal, MD  History of Present Illness:   Patient feels better, notes decreasing pedal edema. Overall respiratory status is improved    Past Medical History:  Diagnosis Date  . Arrhythmia    1995  . Atrial fibrillation ([place])   . CHF (congestive heart failure) ([place]) 10/13/2016  . Coronary artery disease    MI, atherectomy 1993  . DDD (degenerative disc disease)   . Eczema   . Elevated LFTs   . History of gallstones   . History of transient ischemic attack (TIA) 2005   double vision  . Hyperlipidemia   . Hypertension   . Myocardial infarct ([place]) 12/98   inferior  . Pneumonia    long time ago  . Skin cancer   . Stroke ([place])   . Varicose veins      History  Smoking Status  . Former Smoker  . Years: 7.00  . Types: Cigars, Pipe  . Quit date: 11/18/1967  Smokeless Tobacco  . Never Used    Comment: 1-2 cigars a day, pipe    History  Alcohol Use  . 0.6 oz/week  . 1 Glasses of wine per week    Comment: 2-3 glasses of wine in the evening     Allergies  Allergen Reactions  . Penicillins Hives    Current Outpatient Prescriptions  Medication Sig Dispense Refill  . ELIQUIS 5 MG TABS tablet TAKE 1 TABLET TWICE DAILY 180 tablet 1  . furosemide (LASIX) 20 MG tablet Take 2 tablets (40 mg total) by mouth daily. 60 tablet 11  . metoprolol tartrate  (LOPRESSOR) 25 MG tablet Take 25 mg by mouth daily.     . predniSONE (DELTASONE) 5 MG tablet Take 5 mg by mouth daily.     No current facility-administered medications for this visit.        Physical Exam: BP 135/78   Pulse 69   Resp 20   Ht 6\' 2"  (1.88 m)   Wt 185 lb (83.9 kg)   SpO2 98%   BMI 23.75 kg/m   General appearance: alert and cooperative Neurologic: intact Heart: regular rate and rhythm, S1, S2 normal, no murmur, click, rub or gallop Lungs: diminished breath sounds bibasilar Abdomen: soft, non-tender; bowel sounds normal; no masses,  no organomegaly Extremities: Patient's edema in lower extremities is decreased his legs are wrapped, he was just seen yesterday in the wound clinic for the chronic venous stasis ulcers of his legs Wound: Legs are wrapped with bandages, edema at the ankles has decreased, Pleurx catheter dressing is intact    Diagnostic Studies & Laboratory data:      Recent Radiology Findings:   Dg Chest 2 View  Result Date: 04/16/2017 CLINICAL DATA:  Abdominal aortic aneurysm without rupture. Patient reports intermittent shortness of breath. Patient has a right-sided chest tube EXAM: CHEST  2 VIEW COMPARISON:  PA and lateral chest  x-ray of March 12, 2017 FINDINGS: There is a persistent small right pleural effusion. The chest tube tip projects over the medial aspect of the right fifth rib. There is no focal infiltrate. The left lung is clear. The cardiac silhouette is enlarged but stable. The pulmonary vascularity is normal. There is calcification in the wall of the aortic arch and descending thoracic aorta. IMPRESSION: Stable small right pleural effusion. No pneumothorax. Stable cardiomegaly. Thoracic aortic atherosclerosis. Electronically Signed   By: David  Martinique M.D.   On: 04/16/2017 09:07      Recent Lab Findings: Lab Results  Component Value Date   WBC 9.3 02/16/2017   HGB 13.2 02/16/2017   HCT 41.1 02/16/2017   PLT 220 02/16/2017   GLUCOSE  86 02/16/2017   CHOL 192 08/18/2016   TRIG 93.0 08/18/2016   HDL 58.10 08/18/2016   LDLCALC 115 (H) 08/18/2016   ALT 21 02/16/2017   AST 35 02/16/2017   NA 140 02/16/2017   K 3.8 02/16/2017   CL 102 02/16/2017   CREATININE 1.38 (H) 02/16/2017   BUN 22 (H) 02/16/2017   CO2 29 02/16/2017   TSH 1.45 08/18/2016   INR 1.08 02/16/2017   HGBA1C 5.9 (H) 02/01/2016      Assessment / Plan:    Continued decrease in the amount of drainage from the Pleurx catheter, now down to 50-75 mL for the past 3 sequential drainage episodes done every other day. We will move toward drainage Tuesdays and Fridays record output and consider removal in 2-3 weeks depending on the drainage. I'll see the patient back June 28 with a follow-up chest x-ray        [name]      [address] [place] [address] Office [phone]   Beeper [phone]  04/16/2017 9:39 AM

## 2017-04-19 ENCOUNTER — Other Ambulatory Visit: Payer: Self-pay | Admitting: Internal Medicine

## 2017-04-22 ENCOUNTER — Encounter (HOSPITAL_BASED_OUTPATIENT_CLINIC_OR_DEPARTMENT_OTHER): Payer: Medicare Other | Attending: Surgery

## 2017-04-22 DIAGNOSIS — I890 Lymphedema, not elsewhere classified: Secondary | ICD-10-CM | POA: Diagnosis not present

## 2017-04-22 DIAGNOSIS — I872 Venous insufficiency (chronic) (peripheral): Secondary | ICD-10-CM | POA: Diagnosis not present

## 2017-04-22 DIAGNOSIS — I252 Old myocardial infarction: Secondary | ICD-10-CM | POA: Diagnosis not present

## 2017-04-22 DIAGNOSIS — I10 Essential (primary) hypertension: Secondary | ICD-10-CM | POA: Diagnosis not present

## 2017-04-22 DIAGNOSIS — L97821 Non-pressure chronic ulcer of other part of left lower leg limited to breakdown of skin: Secondary | ICD-10-CM | POA: Diagnosis not present

## 2017-04-22 DIAGNOSIS — S41111A Laceration without foreign body of right upper arm, initial encounter: Secondary | ICD-10-CM | POA: Diagnosis not present

## 2017-04-22 DIAGNOSIS — Z86718 Personal history of other venous thrombosis and embolism: Secondary | ICD-10-CM | POA: Insufficient documentation

## 2017-04-22 DIAGNOSIS — I2510 Atherosclerotic heart disease of native coronary artery without angina pectoris: Secondary | ICD-10-CM | POA: Diagnosis not present

## 2017-04-22 DIAGNOSIS — L97819 Non-pressure chronic ulcer of other part of right lower leg with unspecified severity: Secondary | ICD-10-CM | POA: Diagnosis not present

## 2017-04-22 DIAGNOSIS — W19XXXA Unspecified fall, initial encounter: Secondary | ICD-10-CM | POA: Insufficient documentation

## 2017-04-27 ENCOUNTER — Telehealth: Payer: Self-pay | Admitting: Internal Medicine

## 2017-04-27 NOTE — Telephone Encounter (Signed)
Ok for verbal 

## 2017-04-27 NOTE — Telephone Encounter (Signed)
States patient is going out of town for a week.  Requesting verbal ok to move next appt out to 6/18?

## 2017-04-28 NOTE — Telephone Encounter (Signed)
LVM for Arthur [name] with verbal orders

## 2017-05-01 DIAGNOSIS — L97822 Non-pressure chronic ulcer of other part of left lower leg with fat layer exposed: Secondary | ICD-10-CM | POA: Diagnosis not present

## 2017-05-01 DIAGNOSIS — I509 Heart failure, unspecified: Secondary | ICD-10-CM | POA: Diagnosis not present

## 2017-05-01 DIAGNOSIS — L97812 Non-pressure chronic ulcer of other part of right lower leg with fat layer exposed: Secondary | ICD-10-CM | POA: Diagnosis not present

## 2017-05-01 DIAGNOSIS — Z438 Encounter for attention to other artificial openings: Secondary | ICD-10-CM

## 2017-05-01 DIAGNOSIS — N183 Chronic kidney disease, stage 3 (moderate): Secondary | ICD-10-CM | POA: Diagnosis not present

## 2017-05-01 DIAGNOSIS — Z9181 History of falling: Secondary | ICD-10-CM

## 2017-05-01 DIAGNOSIS — I872 Venous insufficiency (chronic) (peripheral): Secondary | ICD-10-CM | POA: Diagnosis not present

## 2017-05-01 DIAGNOSIS — I480 Paroxysmal atrial fibrillation: Secondary | ICD-10-CM | POA: Diagnosis not present

## 2017-05-01 DIAGNOSIS — Z89421 Acquired absence of other right toe(s): Secondary | ICD-10-CM

## 2017-05-01 DIAGNOSIS — I130 Hypertensive heart and chronic kidney disease with heart failure and stage 1 through stage 4 chronic kidney disease, or unspecified chronic kidney disease: Secondary | ICD-10-CM | POA: Diagnosis not present

## 2017-05-01 DIAGNOSIS — Z8673 Personal history of transient ischemic attack (TIA), and cerebral infarction without residual deficits: Secondary | ICD-10-CM | POA: Diagnosis not present

## 2017-05-01 DIAGNOSIS — I2510 Atherosclerotic heart disease of native coronary artery without angina pectoris: Secondary | ICD-10-CM | POA: Diagnosis not present

## 2017-05-01 DIAGNOSIS — Z86718 Personal history of other venous thrombosis and embolism: Secondary | ICD-10-CM | POA: Diagnosis not present

## 2017-05-01 DIAGNOSIS — M6281 Muscle weakness (generalized): Secondary | ICD-10-CM | POA: Diagnosis not present

## 2017-05-01 DIAGNOSIS — Z7901 Long term (current) use of anticoagulants: Secondary | ICD-10-CM | POA: Diagnosis not present

## 2017-05-04 ENCOUNTER — Other Ambulatory Visit: Payer: Self-pay | Admitting: *Deleted

## 2017-05-04 DIAGNOSIS — J90 Pleural effusion, not elsewhere classified: Secondary | ICD-10-CM

## 2017-05-05 ENCOUNTER — Telehealth: Payer: Self-pay | Admitting: Internal Medicine

## 2017-05-05 NOTE — Telephone Encounter (Signed)
Ok for vaseline to wound daily with gauze covering - to change daily until healed

## 2017-05-05 NOTE — Telephone Encounter (Signed)
LVM informing Marlowe Kays of the verbal orders.

## 2017-05-05 NOTE — Telephone Encounter (Signed)
Arthur [name] from [facility] called stating that the pt has a skin tear on his right elbow/forarm. They are requesting wound care orders so that this can be treated.

## 2017-05-06 ENCOUNTER — Other Ambulatory Visit (HOSPITAL_BASED_OUTPATIENT_CLINIC_OR_DEPARTMENT_OTHER): Payer: Medicare Other

## 2017-05-06 ENCOUNTER — Encounter (HOSPITAL_COMMUNITY)
Admission: RE | Admit: 2017-05-06 | Discharge: 2017-05-06 | Disposition: A | Discharge status: Home / Self Care | Payer: Medicare Other | Source: Ambulatory Visit | Attending: Oncology | Admitting: Oncology

## 2017-05-06 ENCOUNTER — Telehealth: Payer: Self-pay | Admitting: Oncology

## 2017-05-06 DIAGNOSIS — C439 Malignant melanoma of skin, unspecified: Secondary | ICD-10-CM | POA: Diagnosis not present

## 2017-05-06 DIAGNOSIS — Z85820 Personal history of malignant melanoma of skin: Secondary | ICD-10-CM | POA: Diagnosis not present

## 2017-05-06 DIAGNOSIS — L97819 Non-pressure chronic ulcer of other part of right lower leg with unspecified severity: Secondary | ICD-10-CM | POA: Diagnosis not present

## 2017-05-06 LAB — GLUCOSE, CAPILLARY: Glucose-Capillary: 108 mg/dL — ABNORMAL HIGH (ref 65–99)

## 2017-05-06 LAB — COMPREHENSIVE METABOLIC PANEL WITH GFR
ALT: 31 U/L (ref 0–55)
AST: 46 U/L — ABNORMAL HIGH (ref 5–34)
Albumin: 3 g/dL — ABNORMAL LOW (ref 3.5–5.0)
Alkaline Phosphatase: 84 U/L (ref 40–150)
Anion Gap: 9 meq/L (ref 3–11)
BUN: 19.5 mg/dL (ref 7.0–26.0)
CO2: 28 meq/L (ref 22–29)
Calcium: 9.5 mg/dL (ref 8.4–10.4)
Chloride: 101 meq/L (ref 98–109)
Creatinine: 1.3 mg/dL (ref 0.7–1.3)
EGFR: 47 ml/min/1.73 m2 — ABNORMAL LOW (ref >90)
Glucose: 99 mg/dL (ref 70–140)
Potassium: 4.7 meq/L (ref 3.5–5.1)
Sodium: 138 meq/L (ref 136–145)
Total Bilirubin: 0.97 mg/dL (ref 0.20–1.20)
Total Protein: 6.8 g/dL (ref 6.4–8.3)

## 2017-05-06 LAB — CBC WITH DIFFERENTIAL/PLATELET
BASO%: 0.9 % (ref 0.0–2.0)
BASOS ABS: 0.1 10*3/uL (ref 0.0–0.1)
EOS ABS: 0.2 10*3/uL (ref 0.0–0.5)
EOS%: 2.2 % (ref 0.0–7.0)
HCT: 39.8 % (ref 38.4–49.9)
HEMOGLOBIN: 13.2 g/dL (ref 13.0–17.1)
LYMPH%: 41.2 % (ref 14.0–49.0)
MCH: 32.3 pg (ref 27.2–33.4)
MCHC: 33.2 g/dL (ref 32.0–36.0)
MCV: 97.3 fL (ref 79.3–98.0)
MONO#: 1.7 10*3/uL — ABNORMAL HIGH (ref 0.1–0.9)
MONO%: 17.8 % — AB (ref 0.0–14.0)
NEUT#: 3.5 10*3/uL (ref 1.5–6.5)
NEUT%: 37.9 % — AB (ref 39.0–75.0)
Platelets: 222 10*3/uL (ref 140–400)
RBC: 4.09 10*6/uL — AB (ref 4.20–5.82)
RDW: 15.3 % — AB (ref 11.0–14.6)
WBC: 9.3 10*3/uL (ref 4.0–10.3)
lymph#: 3.8 10*3/uL — ABNORMAL HIGH (ref 0.9–3.3)

## 2017-05-06 MED ORDER — FLUDEOXYGLUCOSE F - 18 (FDG) INJECTION
9.1600 | Freq: Once | INTRAVENOUS | Status: AC | PRN
  Administered 2017-05-06: 9.16 via INTRAVENOUS

## 2017-05-06 NOTE — Telephone Encounter (Signed)
Patient called and is not able to make the 6/22 appointment due to appointment conflicts.  Patient had a PET Scan today 6/20 and would like the results if he could be rescheduled or have Dr Alen Blew call him with the results

## 2017-05-06 NOTE — Telephone Encounter (Signed)
Please let him know his scan is normal. We will reschedule him to 6/25. Message sent to scheduling.

## 2017-05-07 ENCOUNTER — Telehealth: Payer: Self-pay | Admitting: *Deleted

## 2017-05-07 ENCOUNTER — Ambulatory Visit (HOSPITAL_COMMUNITY)
Admission: RE | Admit: 2017-05-07 | Discharge: 2017-05-07 | Disposition: A | Discharge status: Home / Self Care | Payer: Medicare Other | Source: Ambulatory Visit | Attending: Cardiothoracic Surgery | Admitting: Cardiothoracic Surgery

## 2017-05-07 ENCOUNTER — Encounter (HOSPITAL_COMMUNITY)
Admission: RE | Disposition: A | Discharge status: Home / Self Care | Payer: Self-pay | Source: Ambulatory Visit | Attending: Cardiothoracic Surgery

## 2017-05-07 ENCOUNTER — Telehealth: Payer: Self-pay | Admitting: Oncology

## 2017-05-07 DIAGNOSIS — J90 Pleural effusion, not elsewhere classified: Secondary | ICD-10-CM | POA: Diagnosis not present

## 2017-05-07 HISTORY — PX: REMOVAL OF PLEURAL DRAINAGE CATHETER: SHX5080

## 2017-05-07 SURGERY — REMOVAL, CLOSED DRAINAGE CATHETER SYSTEM, PLEURAL
Anesthesia: Monitor Anesthesia Care | Site: Chest | Laterality: Right

## 2017-05-07 NOTE — Telephone Encounter (Signed)
Called both phones, no answer. Left message to cal me, re: scan results

## 2017-05-07 NOTE — Op Note (Signed)
      [address]       [place] [address]             [phone]     Brief history :  Right pleurx previously placed for recurrent pleural effusion  No recent drainage  Drained prior to removal- approx 40 cc total   Exam: well developed adult male in NAD   BRIEF PROCEDURE NOTE   Right pleurx removed per usual technique  Betadine prep  10cc 2% local lidocaine  Catheter removed intact  #2 3-0 nylon sutures placed  Patient tolerated well  Suture removal in office in 7 days   Margaretann Abate E

## 2017-05-07 NOTE — Progress Notes (Addendum)
pleurex cath removed by Lorna Dibble well discharged via wheelchair no c/o pain alert and orient. Wife at bedside voices understanding of instructions given.

## 2017-05-07 NOTE — Telephone Encounter (Signed)
Confirmed 6/26 appt at 4 pm per sch msg

## 2017-05-08 ENCOUNTER — Ambulatory Visit: Payer: Medicare Other | Admitting: Oncology

## 2017-05-08 ENCOUNTER — Other Ambulatory Visit (INDEPENDENT_AMBULATORY_CARE_PROVIDER_SITE_OTHER): Payer: Medicare Other

## 2017-05-08 ENCOUNTER — Encounter (HOSPITAL_COMMUNITY): Payer: Self-pay | Admitting: Cardiothoracic Surgery

## 2017-05-08 ENCOUNTER — Other Ambulatory Visit: Payer: Self-pay

## 2017-05-08 DIAGNOSIS — K754 Autoimmune hepatitis: Secondary | ICD-10-CM

## 2017-05-08 LAB — HEPATIC FUNCTION PANEL
ALK PHOS: 66 U/L (ref 39–117)
ALT: 28 U/L (ref 0–53)
AST: 39 U/L — AB (ref 0–37)
Albumin: 3.4 g/dL — ABNORMAL LOW (ref 3.5–5.2)
BILIRUBIN DIRECT: 0.2 mg/dL (ref 0.0–0.3)
BILIRUBIN TOTAL: 0.8 mg/dL (ref 0.2–1.2)
Total Protein: 6.6 g/dL (ref 6.0–8.3)

## 2017-05-08 NOTE — Telephone Encounter (Signed)
Left detailed message on answering machine, for appt next week.

## 2017-05-11 ENCOUNTER — Other Ambulatory Visit: Payer: Self-pay | Admitting: Cardiothoracic Surgery

## 2017-05-11 ENCOUNTER — Ambulatory Visit: Payer: Medicare Other | Admitting: Oncology

## 2017-05-11 ENCOUNTER — Other Ambulatory Visit: Payer: Self-pay

## 2017-05-11 DIAGNOSIS — J90 Pleural effusion, not elsewhere classified: Secondary | ICD-10-CM

## 2017-05-11 DIAGNOSIS — R945 Abnormal results of liver function studies: Principal | ICD-10-CM

## 2017-05-11 DIAGNOSIS — R7989 Other specified abnormal findings of blood chemistry: Secondary | ICD-10-CM

## 2017-05-12 ENCOUNTER — Ambulatory Visit (HOSPITAL_BASED_OUTPATIENT_CLINIC_OR_DEPARTMENT_OTHER): Payer: Medicare Other | Admitting: Oncology

## 2017-05-12 ENCOUNTER — Telehealth: Payer: Self-pay | Admitting: Oncology

## 2017-05-12 ENCOUNTER — Telehealth: Payer: Self-pay | Admitting: Internal Medicine

## 2017-05-12 VITALS — BP 128/56 | HR 65 | Temp 98.1°F | Resp 17 | Ht 74.0 in | Wt 190.2 lb

## 2017-05-12 DIAGNOSIS — C439 Malignant melanoma of skin, unspecified: Secondary | ICD-10-CM

## 2017-05-12 DIAGNOSIS — Z85820 Personal history of malignant melanoma of skin: Secondary | ICD-10-CM

## 2017-05-12 NOTE — Telephone Encounter (Signed)
Spoke with pts wife and she is aware. See result note.

## 2017-05-12 NOTE — Telephone Encounter (Signed)
Scheduled appt per 6/26 los - Gave patient avs and calender per los.

## 2017-05-12 NOTE — Progress Notes (Signed)
Hematology and Oncology Follow Up Visit  Arthur [name] [id number] 1925-01-02 83 y.o. 05/12/2017 4:28 PM [name], MDJohn, [name], MD   Principle Diagnosis: 83 year old gentleman diagnosed with Malignant melanoma initially diagnosed in 2012 and found to have a T2b lesion with the depth of invasion of 1.75 mm and a Clark's level IV. He developed regional relapse in October of 2014.    Prior Therapy: 1. In May of 2012, he underwent wide excision and sentinel lymph node biopsy done by Dr. Hassell Done which showed no lymph node involvement and no residual malignant melanoma.  2. He is status post Re-excision of site of melanoma satellite lesion of left forearm. This was done on 08/23/2013.   Current therapy: Observation and surveillance.  Interim History:  Arthur [name] presents today for a followup visit. Since the last visit, he developed recurrent up pleural effusion and respiratory failure while he was in [place]. He underwent a Pleurx catheter insertion in April 2018 that has been recently removed. He has recovered reasonably well from this episode and have resumed most activities of daily living. He does report some mild dyspnea on exertion.  He does not report any skin rashes or lesions. Has not reported any lymphadenopathy or masses. He continues to have a nonhealing ulcers on his lower extremities and falls up with the wound clinic on a weekly basis.   He does not report any headaches or blurry vision or double vision. Does not report any chest pain or shortness of breath. As that report any cough or hemoptysis. Does not report any nausea or vomiting or abdominal pain. Did not report any hematochezia or melena. Did not report any frequency urgency or hesitancy. Does not report any musculoskeletal complaints. Her recent rashes or lesions. The rest of the review of system is unremarkable.   Medications: I have reviewed the patient's current medications.  Current Outpatient Prescriptions   Medication Sig Dispense Refill  . Ascorbic Acid (VITAMIN C PO) Take 1 tablet by mouth daily.    Marland Kitchen ELIQUIS 5 MG TABS tablet TAKE 1 TABLET TWICE DAILY 180 tablet 1  . furosemide (LASIX) 20 MG tablet Take 2 tablets (40 mg total) by mouth daily. (Patient taking differently: Take 20 mg by mouth daily. ) 60 tablet 11  . metoprolol tartrate (LOPRESSOR) 25 MG tablet Take 25 mg by mouth daily.     Marland Kitchen VITAMIN A PO Take 1 capsule by mouth daily.     No current facility-administered medications for this visit.      Allergies:  Allergies  Allergen Reactions  . Penicillins Hives    Has patient had a PCN reaction causing immediate rash, facial/tongue/throat swelling, SOB or lightheadedness with hypotension:Yes Has patient had a PCN reaction causing severe rash involving mucus membranes or skin necrosis: No Has patient had a PCN reaction that required hospitalization: No Has patient had a PCN reaction occurring within the last 10 years: No If all of the above answers are "NO", then may proceed with Cephalosporin use.        Physical Exam: Blood pressure (!) 128/56, pulse 65, temperature 98.1 F (36.7 C), temperature source Oral, resp. rate 17, height 6\' 2"  (1.88 m), weight 190 lb 3.2 oz (86.3 kg), SpO2 98 %. ECOG: 1 General appearance: Well-appearing gentleman appeared without distress. Head: Normocephalic, without any masses or lesions. No ulcers or lesions. Neck: no adenopathy or thyromegaly. Lymph nodes: Cervical, supraclavicular, and axillary nodes normal. No inguinal adenopathy noted either. Heart:regular rate and rhythm, S1,  S2 normal, no murmur, click, rub or gallop Lung:chest clear, no wheezing, rales, normal symmetric air entry.  Abdomin: soft, non-tender, without masses or organomegaly no rebound or guarding. EXT: Open wound noted on the lateral aspect of his right thigh currently covered.  Lab Results: Lab Results  Component Value Date   WBC 9.3 05/06/2017   HGB 13.2 05/06/2017    HCT 39.8 05/06/2017   MCV 97.3 05/06/2017   PLT 222 05/06/2017     Chemistry      Component Value Date/Time   NA 138 05/06/2017 0812   K 4.7 05/06/2017 0812   CL 102 02/16/2017 1032   CO2 28 05/06/2017 0812   BUN 19.5 05/06/2017 0812   CREATININE 1.3 05/06/2017 0812      Component Value Date/Time   CALCIUM 9.5 05/06/2017 0812   ALKPHOS 66 05/08/2017 1602   ALKPHOS 84 05/06/2017 0812   AST 39 (H) 05/08/2017 1602   AST 46 (H) 05/06/2017 0812   ALT 28 05/08/2017 1602   ALT 31 05/06/2017 0812   BILITOT 0.8 05/08/2017 1602   BILITOT 0.97 05/06/2017 0812       EXAM: NUCLEAR MEDICINE PET WHOLE BODY  TECHNIQUE: 9.16 mCi F-18 FDG was injected intravenously. Full-ring PET imaging was performed from the vertex to the feet after the radiotracer. CT data was obtained and used for attenuation correction and anatomic localization.  FASTING BLOOD GLUCOSE:  Value: 108 mg/dl  COMPARISON:  PET-CT dated 05/06/2016.  FINDINGS: HEAD/NECK  No hypermetabolic activity in the scalp. No hypermetabolic cervical lymph nodes.  CHEST  No hypermetabolic mediastinal or hilar nodes. No suspicious pulmonary nodules on the CT scan.  Small right pleural effusion, decreased, with indwelling right pleural drain. Underlying patchy opacity in the posterior right middle and lower lobes, likely compressive atelectasis.  Small left pleural effusion, mildly increased. Associated mild atelectasis in the left lower lobe.  Cardiomegaly. Three vessel coronary atherosclerosis. Atherosclerotic calcifications of the aortic arch.  ABDOMEN/PELVIS  No abnormal hypermetabolic activity within the liver, pancreas, adrenal glands, or spleen. No hypermetabolic lymph nodes in the abdomen or pelvis.  Nodular hepatic contour, suggesting cirrhosis. Cholelithiasis. Atherosclerotic calcifications the abdominal aorta and branch vessels. Small fat containing left inguinal  hernia.  SKELETON  No focal hypermetabolic activity to suggest skeletal metastasis.  Subcutaneous fluid density lesion along the left posterior back favors a sebaceous cyst (series 4/ image 89). Associated mild hypermetabolism, max SUV 5.2.  EXTREMITIES  Mild skin thickening along the right lateral calf (series 4/ image 378), new. Associated mild hypermetabolism, max SUV 5.2.  IMPRESSION: Mild skin thickening along the right lateral calf with associated hypermetabolism, new. Direct inspection is suggested.  Otherwise, no findings to suggest recurrent or metastatic melanoma.  Small right pleural effusion, decreased, with indwelling right pleural drain. Small left pleural effusion, mildly increased.  Additional ancillary findings as above.      83 year old gentleman with the following issues:   1. Malignant melanoma initially diagnosed in 2012 and found to have a T2b lesion with the depth of invasion of 1.75 mm and a Clark's level IV. No ulcerations or vascular invasion was noted and was treated with a wide excision and a sentinel lymph node biopsy that was negative. He had a punch biopsy of a lesion in his left elbow which showed deposits of atypical melanocytes. He is status post reexcision of his melanoma lesion done on 08/23/2013. The pathology reveals area of malignant melanoma with negative resection margins.   His PET CT scan  on obtain on 05/06/2017 showed no evidence of metastatic systemic disease. The hypermetabolic lesion noted on the lateral thigh represented his nonhealing wound. No evidence of skin lesion noted.  He is over 4 years out from his last melanoma recurrence. I recommended annual follow-up at this time and we'll suspend oncology follow-up after 2019.  2. History of CVA: He does have atrial fibrillation and chronically anticoagulated on Eliquis.  3. Dermatology surveillance: I urged him to continue to do so every 6 months. He remains up-to-date at  this time.  4. Followup: Will be in 12 months. Imaging studies will be repeated as needed.   Zola Button, MD 6/26/20184:28 PM

## 2017-05-13 DIAGNOSIS — L97819 Non-pressure chronic ulcer of other part of right lower leg with unspecified severity: Secondary | ICD-10-CM | POA: Diagnosis not present

## 2017-05-14 ENCOUNTER — Encounter: Payer: Medicare Other | Admitting: Cardiothoracic Surgery

## 2017-05-15 ENCOUNTER — Ambulatory Visit
Admission: RE | Admit: 2017-05-15 | Discharge: 2017-05-15 | Disposition: A | Discharge status: Home / Self Care | Payer: Medicare Other | Source: Ambulatory Visit | Attending: Cardiothoracic Surgery | Admitting: Cardiothoracic Surgery

## 2017-05-15 ENCOUNTER — Encounter: Payer: Self-pay | Admitting: Cardiothoracic Surgery

## 2017-05-15 ENCOUNTER — Ambulatory Visit (INDEPENDENT_AMBULATORY_CARE_PROVIDER_SITE_OTHER): Payer: Medicare Other | Admitting: Cardiothoracic Surgery

## 2017-05-15 VITALS — BP 142/79 | HR 59 | Resp 20 | Ht 74.0 in | Wt 188.0 lb

## 2017-05-15 DIAGNOSIS — J90 Pleural effusion, not elsewhere classified: Secondary | ICD-10-CM | POA: Diagnosis not present

## 2017-05-15 NOTE — Progress Notes (Signed)
[address]       [name]             [phone]      Arthur [name]  Medical Record #[id number] Date of Birth: May 09, 1925  Referring: Sherren Mocha, MD Primary Care: Biagio Borg, MD  02/16/2017 OPERATIVE REPORT PREOPERATIVE DIAGNOSES: Recurrent right pleural effusion with hydropneumothorax. POSTOPERATIVE DIAGNOSES: Recurrent right pleural effusion with hydropneumothorax. SURGICAL PROCEDURE: Placement of right PleurX catheter and drainage of right pleural space with fluoroscopic and ultrasound guidance. SURGEON: Lanelle Bal, MD  Chief Complaint:   POST OP FOLLOW UP  History of Present Illness:      Pleurx catheter was removed on 05/07/2017 Patient returns to the office today with a follow-up chest x-ray  Patient continues on Lasix, has not had any worsening shortness of breath since last seen    Past Medical History:  Diagnosis Date  . Arrhythmia    1995  . Atrial fibrillation ([place])   . CHF (congestive heart failure) ([place]) 10/13/2016  . Coronary artery disease    MI, atherectomy 1993  . DDD (degenerative disc disease)   . Eczema   . Elevated LFTs   . History of gallstones   . History of transient ischemic attack (TIA) 2005   double vision  . Hyperlipidemia   . Hypertension   . Myocardial infarct ([place]) 12/98   inferior  . Pneumonia    long time ago  . Skin cancer   . Stroke ([place])   . Varicose veins      History  Smoking Status  . Former Smoker  . Years: 7.00  . Types: Cigars, Pipe  . Quit date: 11/18/1967  Smokeless Tobacco  . Never Used    Comment: 1-2 cigars a day, pipe    History  Alcohol Use  . 0.6 oz/week  . 1 Glasses of wine per week    Comment: 2-3 glasses of wine in the evening     Allergies  Allergen Reactions  . Penicillins Hives    Has patient had a PCN reaction causing immediate rash, facial/tongue/throat swelling, SOB or lightheadedness with hypotension:Yes Has patient  had a PCN reaction causing severe rash involving mucus membranes or skin necrosis: No Has patient had a PCN reaction that required hospitalization: No Has patient had a PCN reaction occurring within the last 10 years: No If all of the above answers are "NO", then may proceed with Cephalosporin use.     Current Outpatient Prescriptions  Medication Sig Dispense Refill  . Ascorbic Acid (VITAMIN C PO) Take 1 tablet by mouth daily.    Marland Kitchen ELIQUIS 5 MG TABS tablet TAKE 1 TABLET TWICE DAILY 180 tablet 1  . furosemide (LASIX) 20 MG tablet Take 2 tablets (40 mg total) by mouth daily. (Patient taking differently: Take 20 mg by mouth daily. ) 60 tablet 11  . metoprolol tartrate (LOPRESSOR) 25 MG tablet Take 25 mg by mouth daily.     Marland Kitchen VITAMIN A PO Take 1 capsule by mouth daily.    Marland Kitchen zinc gluconate 50 MG tablet Take 50 mg by mouth daily.     No current facility-administered medications for this visit.        Physical Exam: BP (!) 142/79   Pulse (!) 59   Resp 20   Ht 6\' 2"  (1.88 m)   Wt 188 lb (85.3 kg)   SpO2 99% Comment: on RA  BMI 24.14 kg/m  General appearance: alert and cooperative Neurologic: intact Heart: irregularly irregular rhythm Lungs: diminished breath sounds RLL Abdomen: soft, non-tender; bowel sounds normal; no masses,  no organomegaly Extremities: Both are bandaged with chronic venous stasis changes. Wound: Pleurx insertion site is well-healed sutures removed   Diagnostic Studies & Laboratory data:     Recent Radiology Findings:   Dg Chest 2 View  Result Date: 05/15/2017 CLINICAL DATA:  Follow-up right pleural effusion. CHF, atrial fibrillation, hypertension. EXAM: CHEST  2 VIEW COMPARISON:  Chest x-ray dated 04/16/2017. Chest x-ray dated 02/16/2017. FINDINGS: Small right pleural effusion, stable compared to chest x-ray of 04/16/2017, decreased slightly compared to the chest x-ray of 02/16/2017. Chest tube has been removed. Left lung remains clear. Mild cardiomegaly  is stable. Aortic atherosclerosis. No acute or suspicious osseous finding. IMPRESSION: 1. Stable small right pleural effusion. Right-sided chest tube has been removed in the interval. 2. No new lung findings. 3. Aortic atherosclerosis. Electronically Signed   By: Franki Cabot M.D.   On: 05/15/2017 11:36      Recent Lab Findings: Lab Results  Component Value Date   WBC 9.3 05/06/2017   HGB 13.2 05/06/2017   HCT 39.8 05/06/2017   PLT 222 05/06/2017   GLUCOSE 99 05/06/2017   CHOL 192 08/18/2016   TRIG 93.0 08/18/2016   HDL 58.10 08/18/2016   LDLCALC 115 (H) 08/18/2016   ALT 28 05/08/2017   AST 39 (H) 05/08/2017   NA 138 05/06/2017   K 4.7 05/06/2017   CL 102 02/16/2017   CREATININE 1.3 05/06/2017   BUN 19.5 05/06/2017   CO2 28 05/06/2017   TSH 1.45 08/18/2016   INR 1.08 02/16/2017   HGBA1C 5.9 (H) 02/01/2016      Assessment / Plan:      Stable chest x-ray after recent removal of Pleurx catheter- no increasing pleural fluid since removal of Pleurx- no evidence of pneumothorax Follow-up chest x-ray in 6 weeks     [name]      [address] [place] [address] Office [phone]   Beeper [phone]  05/15/2017 1:15 PM

## 2017-05-18 ENCOUNTER — Telehealth: Payer: Self-pay | Admitting: *Deleted

## 2017-05-18 NOTE — Telephone Encounter (Signed)
I spoke with the pt and he has been taking Furosemide 20mg  two tablets by mouth once a day (Please see phone note from 03/03/17 with previous clarification).  The pt asked that I contact [facility] to make them aware of correct information since they have filled two prescriptions. I spoke with Caryl Pina in the pharmacy and made her aware to discontinue order for 20mg  every other day.

## 2017-05-18 NOTE — Telephone Encounter (Signed)
Patient called and he stated that [facility], they had two filled for him and he is confused. He stated that they had one for 40 mg qd and one for 20 mg qod. Patient can be reached at [phone]. Thanks, MI

## 2017-05-25 ENCOUNTER — Telehealth: Payer: Self-pay | Admitting: Internal Medicine

## 2017-05-26 ENCOUNTER — Encounter (HOSPITAL_BASED_OUTPATIENT_CLINIC_OR_DEPARTMENT_OTHER): Payer: Medicare Other | Attending: Surgery

## 2017-05-26 DIAGNOSIS — I252 Old myocardial infarction: Secondary | ICD-10-CM | POA: Insufficient documentation

## 2017-05-26 DIAGNOSIS — I2510 Atherosclerotic heart disease of native coronary artery without angina pectoris: Secondary | ICD-10-CM | POA: Diagnosis not present

## 2017-05-26 DIAGNOSIS — L97919 Non-pressure chronic ulcer of unspecified part of right lower leg with unspecified severity: Secondary | ICD-10-CM | POA: Diagnosis not present

## 2017-05-26 DIAGNOSIS — L98499 Non-pressure chronic ulcer of skin of other sites with unspecified severity: Secondary | ICD-10-CM | POA: Insufficient documentation

## 2017-05-26 DIAGNOSIS — I10 Essential (primary) hypertension: Secondary | ICD-10-CM | POA: Insufficient documentation

## 2017-05-26 DIAGNOSIS — I872 Venous insufficiency (chronic) (peripheral): Secondary | ICD-10-CM | POA: Diagnosis not present

## 2017-05-26 DIAGNOSIS — Z86718 Personal history of other venous thrombosis and embolism: Secondary | ICD-10-CM | POA: Insufficient documentation

## 2017-05-26 NOTE — Telephone Encounter (Signed)
Pt had labs done 05/08/17 and was told they were stable, repeat labs in 3 mth and to stay on same medications. Pt states he tried to get a refill on the prednisone at the beginning of June and it was denied. Reports he has been off of prednisone for about a month now. Please advise.

## 2017-05-26 NOTE — Telephone Encounter (Signed)
Spoke with pt and he is aware. 

## 2017-05-26 NOTE — Telephone Encounter (Signed)
Interesting. Did not realize that he is off prednisone. As such, with see how he does off prednisone. Keep plans for repeat labs in 3 months from previous, thanks.

## 2017-06-09 DIAGNOSIS — L97919 Non-pressure chronic ulcer of unspecified part of right lower leg with unspecified severity: Secondary | ICD-10-CM | POA: Diagnosis not present

## 2017-06-16 ENCOUNTER — Other Ambulatory Visit: Payer: Self-pay

## 2017-06-16 ENCOUNTER — Ambulatory Visit (HOSPITAL_COMMUNITY): Payer: Medicare Other | Attending: Cardiology

## 2017-06-16 DIAGNOSIS — I10 Essential (primary) hypertension: Secondary | ICD-10-CM

## 2017-06-16 DIAGNOSIS — I352 Nonrheumatic aortic (valve) stenosis with insufficiency: Secondary | ICD-10-CM | POA: Diagnosis not present

## 2017-06-16 DIAGNOSIS — I313 Pericardial effusion (noninflammatory): Secondary | ICD-10-CM | POA: Insufficient documentation

## 2017-06-16 DIAGNOSIS — I350 Nonrheumatic aortic (valve) stenosis: Secondary | ICD-10-CM | POA: Diagnosis present

## 2017-06-16 DIAGNOSIS — I5032 Chronic diastolic (congestive) heart failure: Secondary | ICD-10-CM | POA: Diagnosis present

## 2017-06-16 DIAGNOSIS — L97919 Non-pressure chronic ulcer of unspecified part of right lower leg with unspecified severity: Secondary | ICD-10-CM | POA: Diagnosis not present

## 2017-06-23 ENCOUNTER — Encounter (HOSPITAL_BASED_OUTPATIENT_CLINIC_OR_DEPARTMENT_OTHER): Payer: Medicare Other | Attending: Surgery

## 2017-06-23 DIAGNOSIS — I2510 Atherosclerotic heart disease of native coronary artery without angina pectoris: Secondary | ICD-10-CM | POA: Diagnosis not present

## 2017-06-23 DIAGNOSIS — L97819 Non-pressure chronic ulcer of other part of right lower leg with unspecified severity: Secondary | ICD-10-CM | POA: Insufficient documentation

## 2017-06-23 DIAGNOSIS — I252 Old myocardial infarction: Secondary | ICD-10-CM | POA: Insufficient documentation

## 2017-06-23 DIAGNOSIS — Z86718 Personal history of other venous thrombosis and embolism: Secondary | ICD-10-CM | POA: Diagnosis not present

## 2017-06-24 ENCOUNTER — Other Ambulatory Visit: Payer: Self-pay | Admitting: Cardiothoracic Surgery

## 2017-06-24 DIAGNOSIS — I712 Thoracic aortic aneurysm, without rupture: Secondary | ICD-10-CM

## 2017-06-24 DIAGNOSIS — I7121 Aneurysm of the ascending aorta, without rupture: Secondary | ICD-10-CM

## 2017-06-25 ENCOUNTER — Encounter: Payer: Self-pay | Admitting: Cardiothoracic Surgery

## 2017-06-25 ENCOUNTER — Ambulatory Visit
Admission: RE | Admit: 2017-06-25 | Discharge: 2017-06-25 | Disposition: A | Discharge status: Home / Self Care | Payer: Medicare Other | Source: Ambulatory Visit | Attending: Cardiothoracic Surgery | Admitting: Cardiothoracic Surgery

## 2017-06-25 ENCOUNTER — Ambulatory Visit (INDEPENDENT_AMBULATORY_CARE_PROVIDER_SITE_OTHER): Payer: Medicare Other | Admitting: Cardiothoracic Surgery

## 2017-06-25 VITALS — BP 137/82 | HR 75 | Temp 97.0°F | Resp 18 | Ht 74.0 in | Wt 179.2 lb

## 2017-06-25 DIAGNOSIS — I712 Thoracic aortic aneurysm, without rupture: Secondary | ICD-10-CM

## 2017-06-25 DIAGNOSIS — J90 Pleural effusion, not elsewhere classified: Secondary | ICD-10-CM

## 2017-06-25 DIAGNOSIS — I7121 Aneurysm of the ascending aorta, without rupture: Secondary | ICD-10-CM

## 2017-06-25 NOTE — Progress Notes (Signed)
[address]       [place] [address]             [phone]      Jaeger G Tax  Medical Record #[id number] Date of Birth: 02/22/1925  Referring: Sherren Mocha, MD Primary Care: Biagio Borg, MD  02/16/2017 OPERATIVE REPORT PREOPERATIVE DIAGNOSES: Recurrent right pleural effusion with hydropneumothorax. POSTOPERATIVE DIAGNOSES: Recurrent right pleural effusion with hydropneumothorax. SURGICAL PROCEDURE: Placement of right PleurX catheter and drainage of right pleural space with fluoroscopic and ultrasound guidance. SURGEON: Lanelle Bal, MD  Chief Complaint:   POST OP FOLLOW UP  History of Present Illness:      Pleurx catheter was removed on 05/07/2017 Patient returns to the office today with a follow-up chest x-ray. He noted that he fell at his house last week going out the back door, is now having railings placed on the back steps. His respiratory status is been relatively stable and less E exerts himself. He did some make a trip to [place] and back to celebrate his 89th birthday without difficulty. He notes the wounds on his legs are almost completely healed now   Patient continues on Lasix, has not had any worsening shortness of breath since last seen    Past Medical History:  Diagnosis Date  . Arrhythmia    1995  . Atrial fibrillation ([place])   . CHF (congestive heart failure) ([place]) 10/13/2016  . Coronary artery disease    MI, atherectomy 1993  . DDD (degenerative disc disease)   . Eczema   . Elevated LFTs   . History of gallstones   . History of transient ischemic attack (TIA) 2005   double vision  . Hyperlipidemia   . Hypertension   . Myocardial infarct ([place]) 12/98   inferior  . Pneumonia    long time ago  . Skin cancer   . Stroke ([place])   . Varicose veins      History  Smoking Status  . Former Smoker  . Years: 7.00  . Types: Cigars, Pipe  . Quit date: 11/18/1967  Smokeless Tobacco  . Never Used   Comment: 1-2 cigars a day, pipe    History  Alcohol Use  . 0.6 oz/week  . 1 Glasses of wine per week    Comment: 2-3 glasses of wine in the evening     Allergies  Allergen Reactions  . Penicillins Hives    Has patient had a PCN reaction causing immediate rash, facial/tongue/throat swelling, SOB or lightheadedness with hypotension:Yes Has patient had a PCN reaction causing severe rash involving mucus membranes or skin necrosis: No Has patient had a PCN reaction that required hospitalization: No Has patient had a PCN reaction occurring within the last 10 years: No If all of the above answers are "NO", then may proceed with Cephalosporin use.     Current Outpatient Prescriptions  Medication Sig Dispense Refill  . Ascorbic Acid (VITAMIN C PO) Take 1 tablet by mouth daily.    Marland Kitchen ELIQUIS 5 MG TABS tablet TAKE 1 TABLET TWICE DAILY 180 tablet 1  . furosemide (LASIX) 20 MG tablet Take 2 tablets (40 mg total) by mouth daily. 60 tablet 11  . metoprolol tartrate (LOPRESSOR) 25 MG tablet Take 25 mg by mouth daily.     Marland Kitchen VITAMIN A PO Take 1 capsule by mouth daily.    Marland Kitchen zinc gluconate 50 MG tablet Take 50 mg by mouth daily.     No current facility-administered medications for  this visit.        Physical Exam: BP 137/82   Pulse 75   Temp (!) 97 F (36.1 C)   Resp 18   Ht 6\' 2"  (1.88 m)   Wt 179 lb 3.2 oz (81.3 kg)   SpO2 98%   BMI 23.01 kg/m   General appearance: Alert and cooperative  Neurologic: Intact Heart: Irregularly irregular rhythm consistent with atrial fibrillation chronic Lungs: Diminished breath sounds right lower lobe Abdomen: Abdomen soft nontender without palpable masses  Extremities: Extensive venous stasis changes chronic lower extremities Wound: Pleurx insertion site is well-healed since removal   Diagnostic Studies & Laboratory data:     Recent Radiology Findings:   Dg Chest 2 View  Result Date: 06/25/2017 CLINICAL DATA:  Pleural effusion EXAM: CHEST  2  VIEW COMPARISON:  May 15, 2017 FINDINGS: There is loculated effusion on the right with atelectasis in the right base. There is a rather minimal left pleural effusion with mild left base atelectasis. The lungs elsewhere are clear. Heart is mildly enlarged with pulmonary vascular within normal limits. There is left anterior descending coronary artery calcification. There is calcification in the aorta. There is also calcification in the right subclavian and axillary arteries. There are surgical clips in left axilla. No blastic or lytic bone lesions. IMPRESSION: Persistent fairly small loculated pleural effusion on the right with patchy atelectasis in the right lower lobe. There is a rather minimal left pleural effusion with left base atelectasis. Heart is prominent but stable. There is aortic atherosclerosis as well as coronary artery calcification. No adenopathy evident. Foci of calcification are also noted in the right subclavian and axillary arteries. Aortic Atherosclerosis (ICD10-I70.0). Electronically Signed   By: Lowella Grip III M.D.   On: 06/25/2017 11:41    I have independently reviewed the above radiology studies  and reviewed the findings with the patient.   Recent Lab Findings: Lab Results  Component Value Date   WBC 9.3 05/06/2017   HGB 13.2 05/06/2017   HCT 39.8 05/06/2017   PLT 222 05/06/2017   GLUCOSE 99 05/06/2017   CHOL 192 08/18/2016   TRIG 93.0 08/18/2016   HDL 58.10 08/18/2016   LDLCALC 115 (H) 08/18/2016   ALT 28 05/08/2017   AST 39 (H) 05/08/2017   NA 138 05/06/2017   K 4.7 05/06/2017   CL 102 02/16/2017   CREATININE 1.3 05/06/2017   BUN 19.5 05/06/2017   CO2 28 05/06/2017   TSH 1.45 08/18/2016   INR 1.08 02/16/2017   HGBA1C 5.9 (H) 02/01/2016      Assessment / Plan:      Stable chest x-ray after recent removal of Pleurx catheter- No further operative intervention necessary for the effusions at this point I've not made a return appointment to be seen in  the surgical office but would be glad to see him at his or Dr. Antionette Char requested anytime.     [name]      [address] [place], [address] Office [phone]   Beeper [phone]  06/25/2017 12:13 PM

## 2017-06-30 DIAGNOSIS — L97819 Non-pressure chronic ulcer of other part of right lower leg with unspecified severity: Secondary | ICD-10-CM | POA: Diagnosis not present

## 2017-07-17 ENCOUNTER — Ambulatory Visit: Payer: Medicare Other | Admitting: Cardiovascular Disease

## 2017-08-14 ENCOUNTER — Telehealth: Payer: Self-pay | Admitting: Cardiovascular Disease

## 2017-08-14 NOTE — Telephone Encounter (Signed)
New message  Santiago Glad verbalized that she is calling for the rn   To speak to her about pt  He did not disclose any other information

## 2017-08-14 NOTE — Telephone Encounter (Signed)
Left message to call back  

## 2017-08-17 ENCOUNTER — Other Ambulatory Visit (INDEPENDENT_AMBULATORY_CARE_PROVIDER_SITE_OTHER): Payer: Medicare Other

## 2017-08-17 ENCOUNTER — Encounter: Payer: Self-pay | Admitting: Cardiovascular Disease

## 2017-08-17 ENCOUNTER — Ambulatory Visit (INDEPENDENT_AMBULATORY_CARE_PROVIDER_SITE_OTHER): Payer: Medicare Other | Admitting: Cardiovascular Disease

## 2017-08-17 ENCOUNTER — Other Ambulatory Visit: Payer: Medicare Other

## 2017-08-17 VITALS — BP 118/78 | HR 64 | Ht 74.0 in | Wt 171.8 lb

## 2017-08-17 DIAGNOSIS — I4821 Permanent atrial fibrillation: Secondary | ICD-10-CM

## 2017-08-17 DIAGNOSIS — I350 Nonrheumatic aortic (valve) stenosis: Secondary | ICD-10-CM | POA: Diagnosis not present

## 2017-08-17 DIAGNOSIS — I5032 Chronic diastolic (congestive) heart failure: Secondary | ICD-10-CM

## 2017-08-17 DIAGNOSIS — I482 Chronic atrial fibrillation: Secondary | ICD-10-CM | POA: Diagnosis not present

## 2017-08-17 DIAGNOSIS — R945 Abnormal results of liver function studies: Secondary | ICD-10-CM | POA: Diagnosis not present

## 2017-08-17 DIAGNOSIS — R7989 Other specified abnormal findings of blood chemistry: Secondary | ICD-10-CM

## 2017-08-17 LAB — HEPATIC FUNCTION PANEL
ALK PHOS: 181 U/L — AB (ref 39–117)
ALT: 187 U/L — ABNORMAL HIGH (ref 0–53)
AST: 230 U/L — ABNORMAL HIGH (ref 0–37)
Albumin: 2.9 g/dL — ABNORMAL LOW (ref 3.5–5.2)
BILIRUBIN DIRECT: 1.2 mg/dL — AB (ref 0.0–0.3)
Total Bilirubin: 2.2 mg/dL — ABNORMAL HIGH (ref 0.2–1.2)
Total Protein: 7.9 g/dL (ref 6.0–8.3)

## 2017-08-17 MED ORDER — FUROSEMIDE 20 MG PO TABS: 20.0000 mg | ORAL_TABLET | Freq: Every day | ORAL | 3 refills | Status: DC

## 2017-08-17 NOTE — Telephone Encounter (Signed)
Discussed instructions given at [place] today. Gave number for [facility] to get new PCP since patient does not "mesh well" with his current doctor. She was grateful for call and agrees with treatment plan.

## 2017-08-17 NOTE — Telephone Encounter (Signed)
Follow up       Returning a call to the nurse.  Daughter want to talk to you prior to 11am appt

## 2017-08-17 NOTE — Patient Instructions (Addendum)
Medication Instructions:  1) DECREASE LASIX to 20 gm daily  Labwork: None  Testing/Procedures: None  Follow-Up: Your provider recommends that you schedule a follow-up appointment in 3 MONTHS with Truitt Merle, NP.  Any Other Special Instructions Will Be Listed Below (If Applicable).     If you need a refill on your cardiac medications before your next appointment, please call your pharmacy.

## 2017-08-17 NOTE — Progress Notes (Signed)
Cardiology Office Note Date:  08/17/2017   ID:  Arthur [name], DOB June 07, 1925, MRN [id number]  PCP:  Biagio Borg, MD  Cardiologist:  Sherren Mocha, MD    Chief Complaint  Patient presents with  . Follow-up    chronic diastolic heart failure     History of Present Illness: Arthur [name] is a 83 y.o. male who presents for follow-up of multiple problems.   He has permanent atrial fibrillation, hx of stroke, CAD with remote MI and PCI. He underwent pleurex catheter placement for a chronic pleural effusion earlier this year. This has been removed and the patient has been stable.   He is here with his wife today. He had a mechanical fall several weeks ago, didn't go for XRay. Complains of shoulder pain but starting to improve.   He denies chest pain, pressure, or shortness of breath at rest. No cough. DOE is unchanged recently. Denies further problems with leg swelling but would like to reduce lasix because of frequent urination.    Past Medical History:  Diagnosis Date  . Arrhythmia    1995  . Atrial fibrillation ([facility])   . CHF (congestive heart failure) ([facility]) 10/13/2016  . Coronary artery disease    MI, atherectomy 1993  . DDD (degenerative disc disease)   . Eczema   . Elevated LFTs   . History of gallstones   . History of transient ischemic attack (TIA) 2005   double vision  . Hyperlipidemia   . Hypertension   . Myocardial infarct ([facility]) 12/98   inferior  . Pneumonia    long time ago  . Skin cancer   . Stroke ([facility])   . Varicose veins     Past Surgical History:  Procedure Laterality Date  . AMPUTATION Right 05/10/2013   Procedure: RIGHT SECOND TOE AMPUTATION;  Surgeon: Hessie Dibble, MD;  Location: [facility];  Service: Orthopedics;  Laterality: Right;  . APPENDECTOMY    . CARDIAC CATHETERIZATION  04/08/99   LAD 70% INTER 40-50% RCA 30-40%  . CHEST TUBE INSERTION Right 02/16/2017   Procedure: INSERTION PLEURAL DRAINAGE CATHETER;  Surgeon:  Grace Isaac, MD;  Location: [facility];  Service: Thoracic;  Laterality: Right;  . CORONARY ANGIOPLASTY  04/08/99   LAD  . CORONARY ANGIOPLASTY  10/17/97   RCA   . CORONARY ANGIOPLASTY WITH STENT PLACEMENT  04/08/99   LAD  . MELANOMA EXCISION  2013   lt arm  . MELANOMA EXCISION Left 08/23/2013   Procedure: MELANOMA EXCISION;  Surgeon: Pedro Earls, MD;  [facility];  Service: General;  Laterality: Left;  . REMOVAL OF PLEURAL DRAINAGE CATHETER Right 05/07/2017   Procedure: REMOVAL OF PLEURAL DRAINAGE CATHETER;  Surgeon: Grace Isaac, MD;  Location: [facility];  Service: Thoracic;  Laterality: Right;  . TONSILLECTOMY    . VIDEO BRONCHOSCOPY Bilateral 05/07/2015   Procedure: VIDEO BRONCHOSCOPY WITHOUT FLUORO;  Surgeon: Rigoberto Noel, MD;  Location: [facility];  Service: Cardiopulmonary;  Laterality: Bilateral;    Current Outpatient Prescriptions  Medication Sig Dispense Refill  . apixaban (ELIQUIS) 5 MG TABS tablet Take 5 mg by mouth 2 (two) times daily.    . Ascorbic Acid (VITAMIN C PO) Take 1 tablet by mouth daily.    . furosemide (LASIX) 20 MG tablet Take 2 tablets (40 mg total) by mouth daily. 60 tablet 11  . metoprolol tartrate (LOPRESSOR) 25 MG tablet Take 25 mg by mouth daily.     Marland Kitchen  VITAMIN A PO Take 1 capsule by mouth daily.    Marland Kitchen zinc gluconate 50 MG tablet Take 50 mg by mouth daily.     No current facility-administered medications for this visit.     Allergies:   Penicillins   Social History:  The patient  reports that he quit smoking about 49 years ago. His smoking use included Cigars and Pipe. He quit after 7.00 years of use. He has never used smokeless tobacco. He reports that he drinks about 0.6 oz of alcohol per week . He reports that he does not use drugs.   Family History:  The patient's family history includes Heart attack (age of onset: 36) in his mother; Pneumonia in his father.    ROS:  Please see the history of present illness.   All other systems are  reviewed and negative.    PHYSICAL EXAM: VS:  BP 118/78   Pulse 64   Ht 6\' 2"  (1.88 m)   Wt 77.9 kg (171 lb 12.8 oz)   BMI 22.06 kg/m  , BMI Body mass index is 22.06 kg/m. GEN: Pleasant elderly male, in no acute distress  HEENT: normal  Neck: no JVD, no masses. No carotid bruits Cardiac: RRR with 3/6 harsh mid-peaking systolic murmur best heard at the apex               Respiratory:  clear to auscultation bilaterally, normal work of breathing GI: soft, nontender, nondistended, + BS MS: no deformity or atrophy  Ext: 1+ ankleedema, pedal pulses 2+= bilaterally Skin: warm and dry, no rash Neuro:  Strength and sensation are intact Psych: euthymic mood, full affect  EKG:  EKG is ordered today. The ekg ordered today shows atrial fibrillation with competing junctional pacemaker 64 bpm, possible anterior infarct age-undetermined  Recent Labs: 08/18/2016: TSH 1.45 02/10/2017: NT-Pro BNP 5,770 05/06/2017: BUN 19.5; Creatinine 1.3; HGB 13.2; Platelets 222; Potassium 4.7; Sodium 138 05/08/2017: ALT 28   Lipid Panel     Component Value Date/Time   CHOL 192 08/18/2016 1534   TRIG 93.0 08/18/2016 1534   HDL 58.10 08/18/2016 1534   CHOLHDL 3 08/18/2016 1534   VLDL 18.6 08/18/2016 1534   LDLCALC 115 (H) 08/18/2016 1534      Wt Readings from Last 3 Encounters:  08/17/17 77.9 kg (171 lb 12.8 oz)  06/25/17 81.3 kg (179 lb 3.2 oz)  05/15/17 85.3 kg (188 lb)     Cardiac Studies Reviewed: Echo 06-16-2017: Study Conclusions  - Left ventricle: The cavity size was normal. Wall thickness was   increased in a pattern of moderate LVH. Systolic function was   normal. The estimated ejection fraction was in the range of 60%   to 65%. Wall motion was normal; there were no regional wall   motion abnormalities. - Aortic valve: Valve mobility was restricted. There was mild to   moderate stenosis. There was mild regurgitation. - Aortic root: The aortic root was mildly dilated. - Ascending aorta:  The ascending aorta was mildly dilated. - Mitral valve: Calcified annulus. - Left atrium: The atrium was severely dilated. - Right atrium: The atrium was severely dilated. - Pulmonary arteries: Systolic pressure was mildly increased. PA   peak pressure: 43 mm Hg (S). - Pericardium, extracardiac: A trivial pericardial effusion was   identified.  Impressions:  - Normal LV systolic function; moderate LVH; calcified aortic valve   with mild to moderate AS (mean gradient 19 mmHg); mild AI; mildly   dilated aortic root; severe biatrial  enlargement; mild TR with   mildly elevated pulmonary pressure.  ASSESSMENT AND PLAN: 1.  Permanent atrial fibrillation: continue anticoagulation with Eliquis. Will have to keep an eye on fall risk, but with prior stroke benefit of oral anticoagulation outweighs risk at present.   2. CAD, native vessel: no angina. Continue current Rx.   3. Chronic diastolic heart failure: advised he can reduce lasix to 20 mg daily. Asked him to check daily weights and if weight increases by 3# or leg swelling worsens, will need to go back to 40 mg dose.  4. Aortic stenosis: moderate by recent echo. Continue clinical follow-up. Reviewed echo findings with patient and wife today.   Current medicines are reviewed with the patient today.  The patient does not have concerns regarding medicines.  Labs/ tests ordered today include:  No orders of the defined types were placed in this encounter.   Disposition:   FU 3 months with Truitt Merle, NP   Signed, Sherren Mocha, MD  08/17/2017 11:29 AM    [facility] [address], [place], [place]  [address] Phone: [phone]; Fax: [phone]

## 2017-08-17 NOTE — Telephone Encounter (Signed)
Patient's daughter (DPR) called to discuss problems her dad is having since she cannot come to appointment today. She reports the patient is weaker than usual lately maybe because he is not getting enough exercise.  She states he tries to exercise, but he fell recently and hurt his shoulder. Ever since then, both his shoulders hurt and they seem to hurt progressively throughout the day. She also says his ankles hurt him. She reports he looks thin and is probably losing weight, but has no weights to record.  She says he has not complained of SOB, CP or palpitations. She simply wants to call and let Dr. Burt Knack know in case the patient does not report symptoms. She also would like Dr. Antionette Char recommendations on a PCP.

## 2017-08-18 ENCOUNTER — Other Ambulatory Visit: Payer: Self-pay

## 2017-08-18 ENCOUNTER — Telehealth: Payer: Self-pay | Admitting: Internal Medicine

## 2017-08-18 DIAGNOSIS — R945 Abnormal results of liver function studies: Principal | ICD-10-CM

## 2017-08-18 DIAGNOSIS — R7989 Other specified abnormal findings of blood chemistry: Secondary | ICD-10-CM

## 2017-08-18 MED ORDER — PREDNISONE 20 MG PO TABS: 20.0000 mg | ORAL_TABLET | Freq: Every day | ORAL | 3 refills | Status: DC

## 2017-08-18 NOTE — Telephone Encounter (Signed)
Pt wanted to know how elevated his LFT's were. Results discussed with pt and questions were answered.

## 2017-09-04 ENCOUNTER — Other Ambulatory Visit: Payer: Self-pay

## 2017-09-04 ENCOUNTER — Other Ambulatory Visit (INDEPENDENT_AMBULATORY_CARE_PROVIDER_SITE_OTHER): Payer: Medicare Other

## 2017-09-04 DIAGNOSIS — R945 Abnormal results of liver function studies: Secondary | ICD-10-CM

## 2017-09-04 DIAGNOSIS — R7989 Other specified abnormal findings of blood chemistry: Secondary | ICD-10-CM

## 2017-09-04 LAB — HEPATIC FUNCTION PANEL
ALBUMIN: 3 g/dL — AB (ref 3.5–5.2)
ALT: 62 U/L — ABNORMAL HIGH (ref 0–53)
AST: 54 U/L — AB (ref 0–37)
Alkaline Phosphatase: 130 U/L — ABNORMAL HIGH (ref 39–117)
Bilirubin, Direct: 0.6 mg/dL — ABNORMAL HIGH (ref 0.0–0.3)
Total Bilirubin: 1.4 mg/dL — ABNORMAL HIGH (ref 0.2–1.2)
Total Protein: 7.3 g/dL (ref 6.0–8.3)

## 2017-09-11 ENCOUNTER — Telehealth: Payer: Self-pay | Admitting: Internal Medicine

## 2017-09-11 NOTE — Telephone Encounter (Signed)
Spoke with pt and he states he will come next week for the labs.

## 2017-09-18 ENCOUNTER — Other Ambulatory Visit (INDEPENDENT_AMBULATORY_CARE_PROVIDER_SITE_OTHER): Payer: Medicare Other

## 2017-09-18 DIAGNOSIS — R7989 Other specified abnormal findings of blood chemistry: Secondary | ICD-10-CM

## 2017-09-18 DIAGNOSIS — R945 Abnormal results of liver function studies: Secondary | ICD-10-CM

## 2017-09-18 LAB — HEPATIC FUNCTION PANEL
ALT: 43 U/L (ref 0–53)
AST: 40 U/L — ABNORMAL HIGH (ref 0–37)
Albumin: 3.2 g/dL — ABNORMAL LOW (ref 3.5–5.2)
Alkaline Phosphatase: 105 U/L (ref 39–117)
BILIRUBIN TOTAL: 1.3 mg/dL — AB (ref 0.2–1.2)
Bilirubin, Direct: 0.5 mg/dL — ABNORMAL HIGH (ref 0.0–0.3)
TOTAL PROTEIN: 6.9 g/dL (ref 6.0–8.3)

## 2017-09-21 ENCOUNTER — Other Ambulatory Visit: Payer: Self-pay

## 2017-09-21 DIAGNOSIS — R945 Abnormal results of liver function studies: Principal | ICD-10-CM

## 2017-09-21 DIAGNOSIS — R7989 Other specified abnormal findings of blood chemistry: Secondary | ICD-10-CM

## 2017-09-21 MED ORDER — PREDNISONE 5 MG PO TABS: 5.0000 mg | ORAL_TABLET | Freq: Every day | ORAL | 0 refills | Status: DC

## 2017-09-22 IMAGING — DX DG CHEST 2V
2 series · 2 of 2 positions shown · non-contrast
Comparison: 02/16/2017

CLINICAL DATA: Follow-up thoracic aortic aneurysm.  Chest tube.

EXAM:
CHEST  2 VIEW

[dg chest 2 view (1 of 2)]
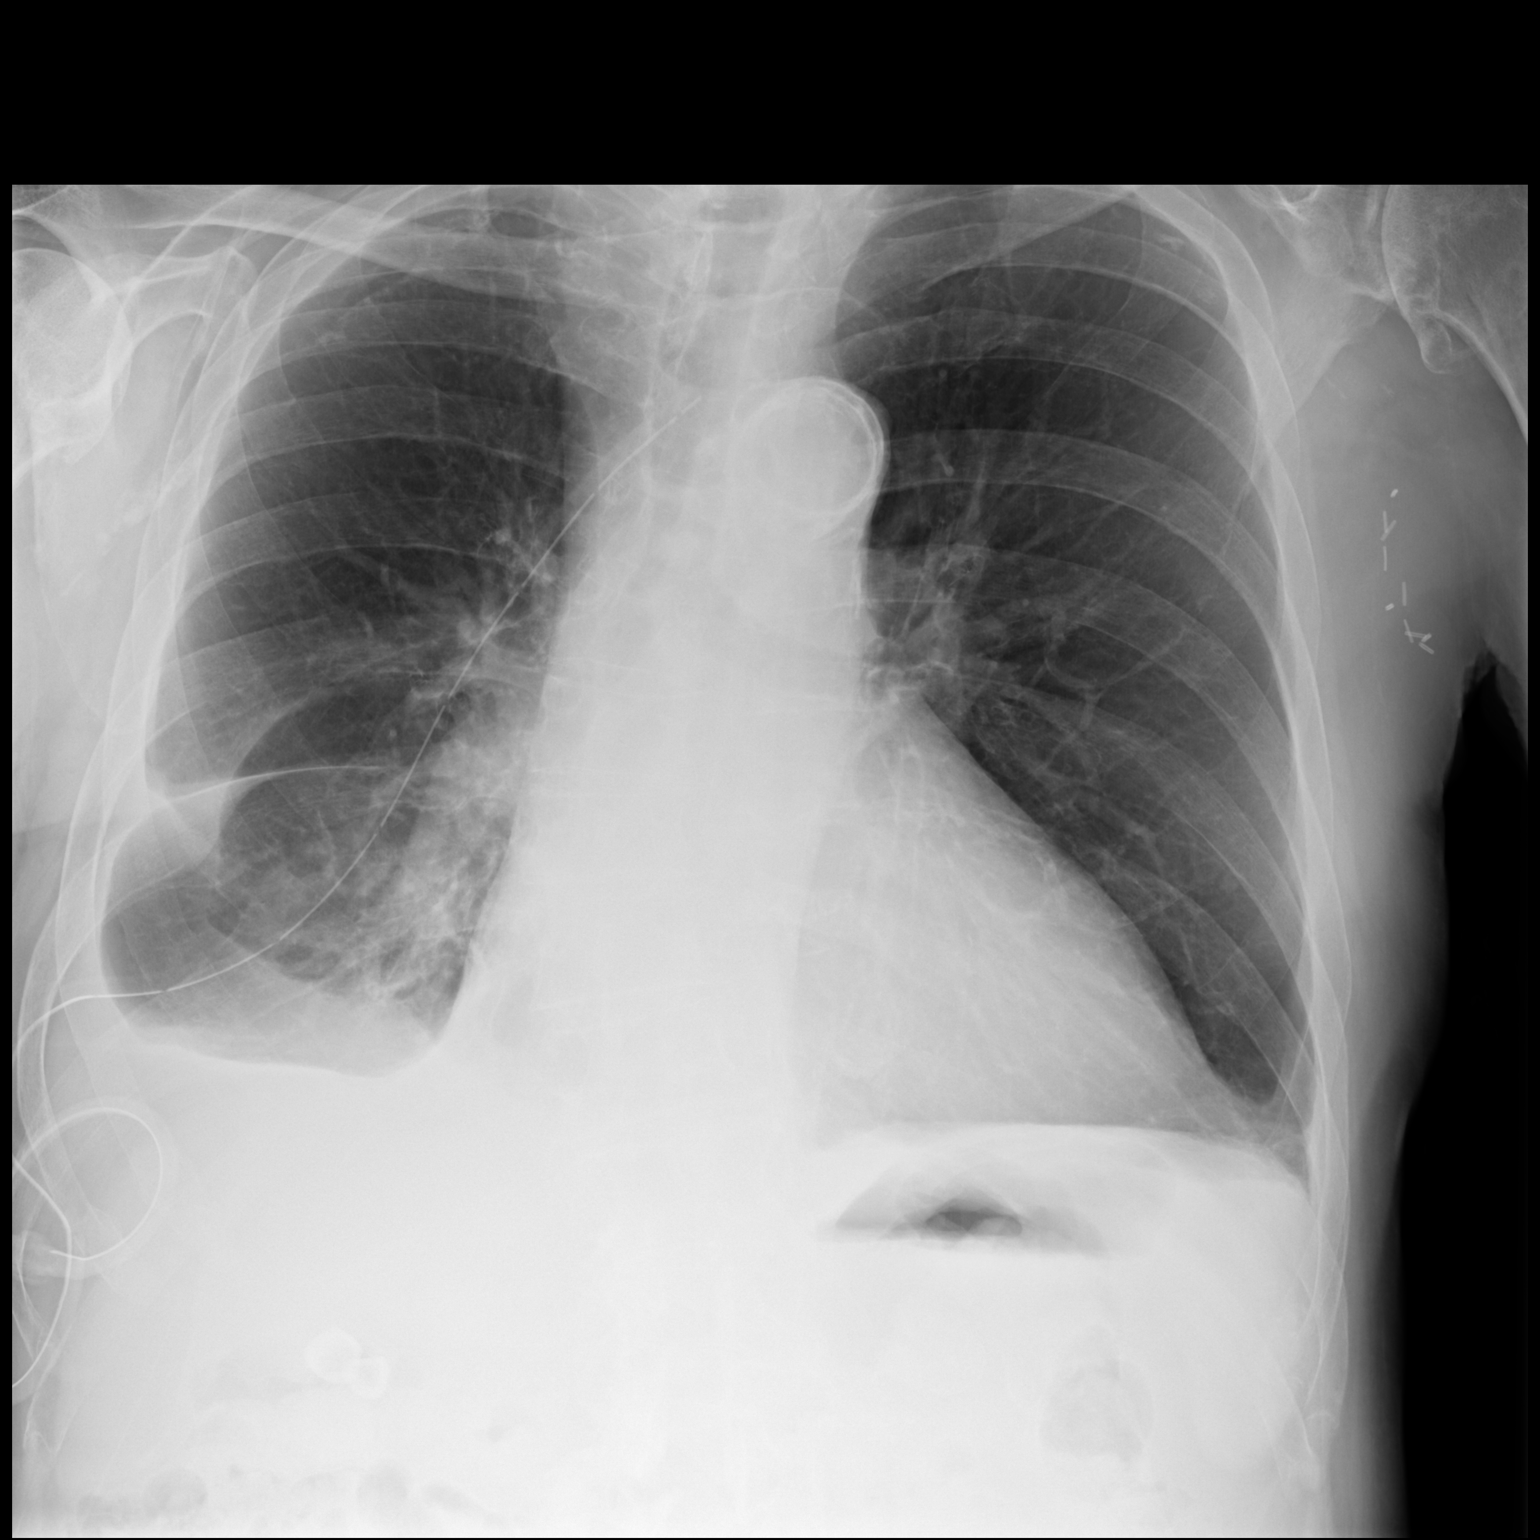

[dg chest 2 view (2 of 2)]
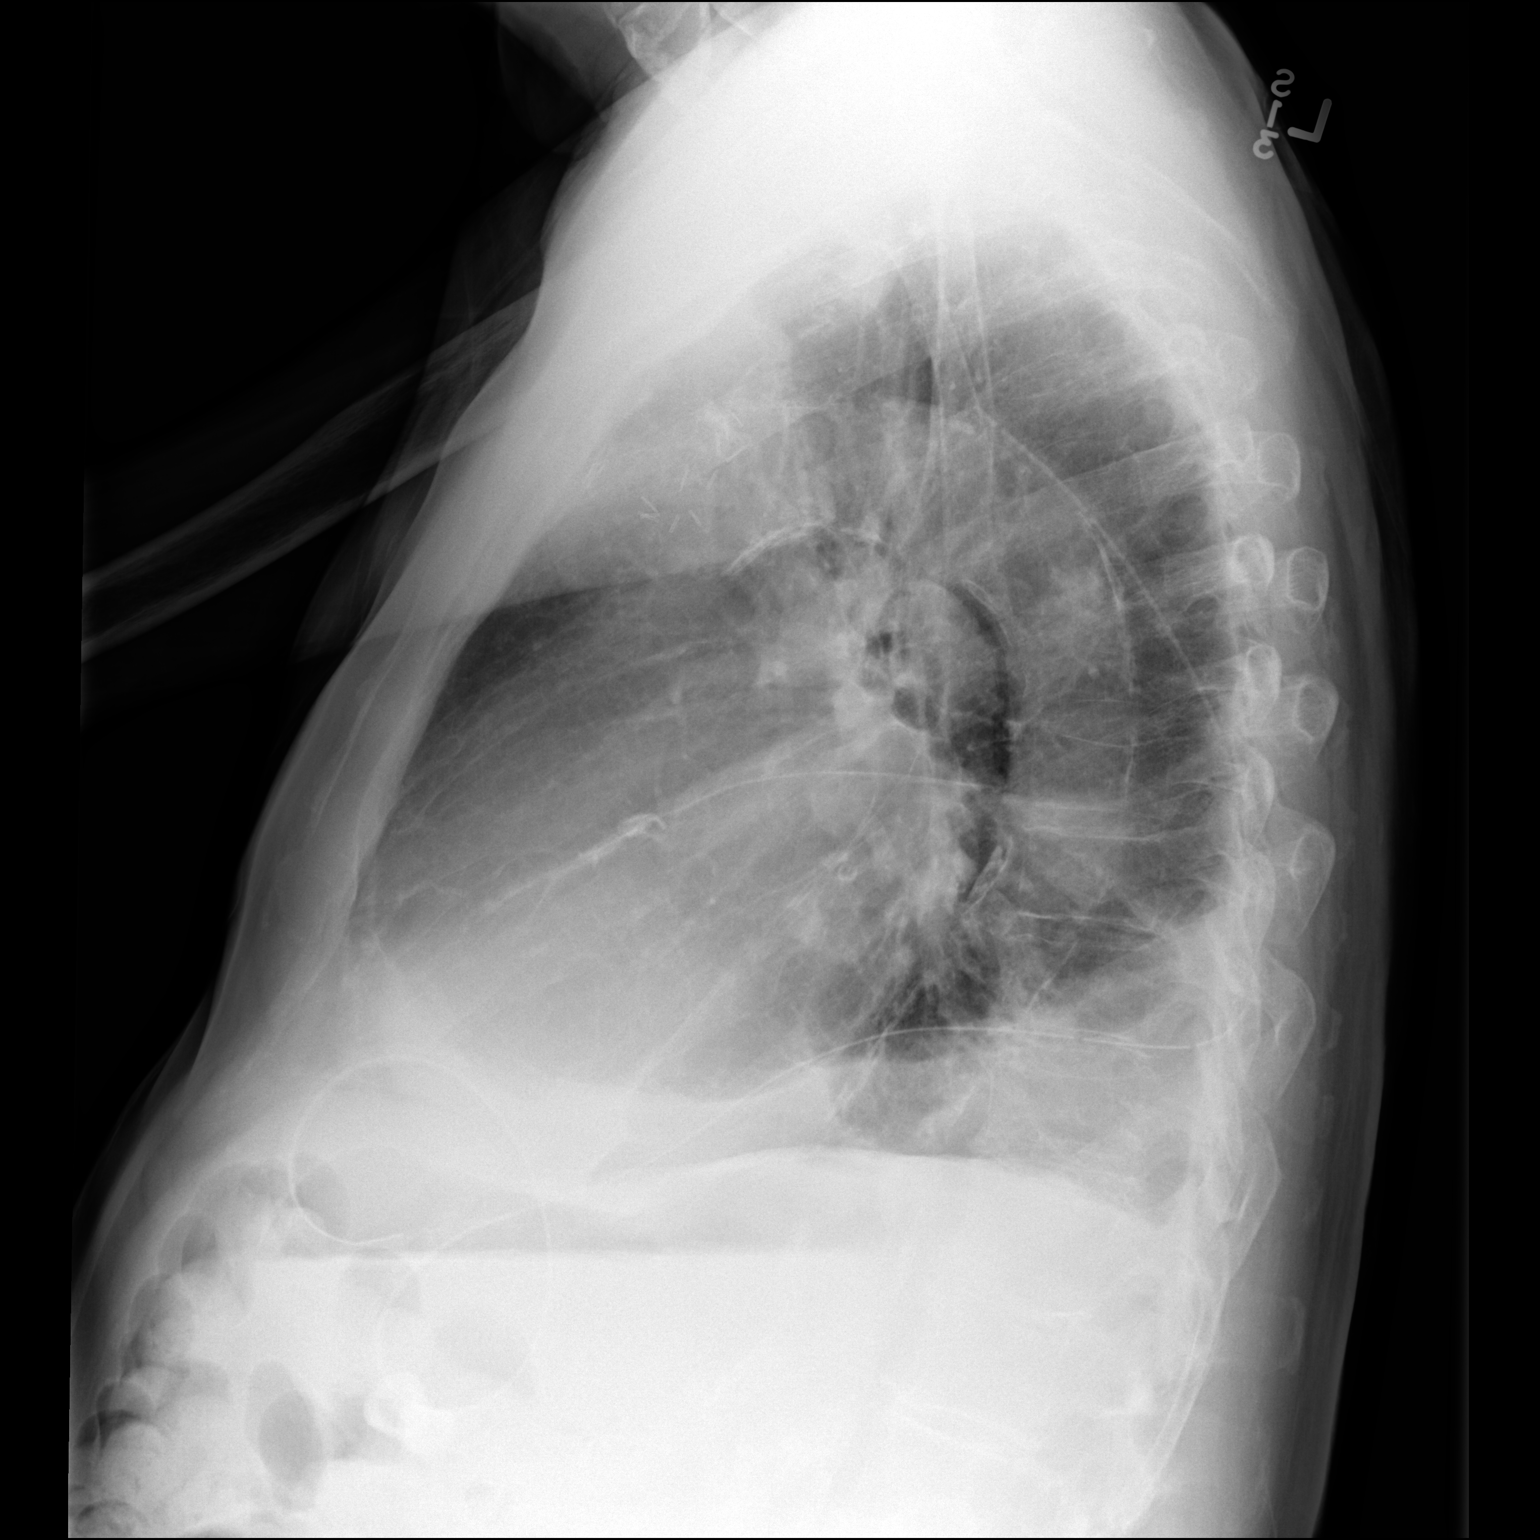

[2 of 2 positions shown; findings below may reference images not displayed]

FINDINGS: Right chest tube remains in place. Small right pleural effusion.
Resolution of the previously seen moderate-sized pneumothorax. Right
lower lobe atelectasis noted. Mild cardiomegaly. No focal opacity on
the left.
IMPRESSION: Right chest tube remains in place with small right pleural effusion.
No visible pneumothorax currently. Right base atelectasis.

Cardiomegaly.

## 2017-10-02 ENCOUNTER — Other Ambulatory Visit (INDEPENDENT_AMBULATORY_CARE_PROVIDER_SITE_OTHER): Payer: Medicare Other

## 2017-10-02 ENCOUNTER — Other Ambulatory Visit: Payer: Self-pay | Admitting: Internal Medicine

## 2017-10-02 DIAGNOSIS — R7989 Other specified abnormal findings of blood chemistry: Secondary | ICD-10-CM

## 2017-10-02 DIAGNOSIS — R945 Abnormal results of liver function studies: Secondary | ICD-10-CM | POA: Diagnosis not present

## 2017-10-02 LAB — HEPATIC FUNCTION PANEL
ALT: 34 U/L (ref 0–53)
AST: 36 U/L (ref 0–37)
Albumin: 3.2 g/dL — ABNORMAL LOW (ref 3.5–5.2)
Alkaline Phosphatase: 101 U/L (ref 39–117)
Bilirubin, Direct: 0.5 mg/dL — ABNORMAL HIGH (ref 0.0–0.3)
Total Bilirubin: 1.3 mg/dL — ABNORMAL HIGH (ref 0.2–1.2)
Total Protein: 6.7 g/dL (ref 6.0–8.3)

## 2017-10-05 ENCOUNTER — Other Ambulatory Visit: Payer: Self-pay

## 2017-10-05 DIAGNOSIS — R945 Abnormal results of liver function studies: Principal | ICD-10-CM

## 2017-10-05 DIAGNOSIS — R7989 Other specified abnormal findings of blood chemistry: Secondary | ICD-10-CM

## 2017-10-12 ENCOUNTER — Other Ambulatory Visit: Payer: Self-pay | Admitting: Cardiovascular Disease

## 2017-10-29 ENCOUNTER — Other Ambulatory Visit: Payer: Medicare Other

## 2017-11-12 ENCOUNTER — Other Ambulatory Visit (INDEPENDENT_AMBULATORY_CARE_PROVIDER_SITE_OTHER): Payer: Medicare Other

## 2017-11-12 DIAGNOSIS — R945 Abnormal results of liver function studies: Secondary | ICD-10-CM

## 2017-11-12 DIAGNOSIS — R7989 Other specified abnormal findings of blood chemistry: Secondary | ICD-10-CM

## 2017-11-12 LAB — HEPATIC FUNCTION PANEL
ALBUMIN: 3.4 g/dL — AB (ref 3.5–5.2)
ALK PHOS: 96 U/L (ref 39–117)
ALT: 35 U/L (ref 0–53)
AST: 41 U/L — AB (ref 0–37)
BILIRUBIN DIRECT: 0.3 mg/dL (ref 0.0–0.3)
TOTAL PROTEIN: 6.6 g/dL (ref 6.0–8.3)
Total Bilirubin: 1 mg/dL (ref 0.2–1.2)

## 2017-11-18 ENCOUNTER — Other Ambulatory Visit: Payer: Self-pay

## 2017-11-18 DIAGNOSIS — R945 Abnormal results of liver function studies: Principal | ICD-10-CM

## 2017-11-18 DIAGNOSIS — R7989 Other specified abnormal findings of blood chemistry: Secondary | ICD-10-CM

## 2017-12-08 ENCOUNTER — Ambulatory Visit (INDEPENDENT_AMBULATORY_CARE_PROVIDER_SITE_OTHER): Payer: Medicare Other | Admitting: Nurse Practitioner

## 2017-12-08 ENCOUNTER — Encounter: Payer: Self-pay | Admitting: Nurse Practitioner

## 2017-12-08 VITALS — BP 128/82 | HR 70 | Ht 74.0 in | Wt 173.1 lb

## 2017-12-08 DIAGNOSIS — Z79899 Other long term (current) drug therapy: Secondary | ICD-10-CM | POA: Diagnosis not present

## 2017-12-08 DIAGNOSIS — I872 Venous insufficiency (chronic) (peripheral): Secondary | ICD-10-CM | POA: Diagnosis not present

## 2017-12-08 DIAGNOSIS — I5032 Chronic diastolic (congestive) heart failure: Secondary | ICD-10-CM | POA: Diagnosis not present

## 2017-12-08 DIAGNOSIS — I259 Chronic ischemic heart disease, unspecified: Secondary | ICD-10-CM

## 2017-12-08 DIAGNOSIS — I482 Chronic atrial fibrillation: Secondary | ICD-10-CM | POA: Diagnosis not present

## 2017-12-08 DIAGNOSIS — I4821 Permanent atrial fibrillation: Secondary | ICD-10-CM

## 2017-12-08 LAB — CBC
Hematocrit: 41.6 % (ref 37.5–51.0)
Hemoglobin: 14.1 g/dL (ref 13.0–17.7)
MCH: 34.6 pg — ABNORMAL HIGH (ref 26.6–33.0)
MCHC: 33.9 g/dL (ref 31.5–35.7)
MCV: 102 fL — ABNORMAL HIGH (ref 79–97)
Platelets: 137 10*3/uL — ABNORMAL LOW (ref 150–379)
RBC: 4.07 x10E6/uL — ABNORMAL LOW (ref 4.14–5.80)
RDW: 15.1 % (ref 12.3–15.4)
WBC: 8.9 10*3/uL (ref 3.4–10.8)

## 2017-12-08 LAB — BASIC METABOLIC PANEL
BUN/Creatinine Ratio: 16 (ref 10–24)
BUN: 26 mg/dL (ref 10–36)
CO2: 27 mmol/L (ref 20–29)
Calcium: 9.1 mg/dL (ref 8.6–10.2)
Chloride: 103 mmol/L (ref 96–106)
Creatinine, Ser: 1.58 mg/dL — ABNORMAL HIGH (ref 0.76–1.27)
GFR calc Af Amer: 44 mL/min/{1.73_m2} — ABNORMAL LOW (ref >59)
GFR calc non Af Amer: 38 mL/min/{1.73_m2} — ABNORMAL LOW (ref >59)
Glucose: 143 mg/dL — ABNORMAL HIGH (ref 65–99)
Potassium: 4.7 mmol/L (ref 3.5–5.2)
Sodium: 141 mmol/L (ref 134–144)
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
## 2017-12-08 NOTE — Patient Instructions (Addendum)
We will be checking the following labs today - BMET and CBC   Medication Instructions:    Continue with your current medicines.     Testing/Procedures To Be Arranged:  N/A  Follow-Up:   See Dr. Burt Knack in 4 to 6 months    Other Special Instructions:   N/A    If you need a refill on your cardiac medications before your next appointment, please call your pharmacy.   Call the [facility] office at [phone] if you have any questions, problems or concerns.

## 2017-12-20 ENCOUNTER — Other Ambulatory Visit: Payer: Self-pay | Admitting: Internal Medicine

## 2017-12-21 ENCOUNTER — Telehealth: Payer: Self-pay | Admitting: Internal Medicine

## 2017-12-21 NOTE — Telephone Encounter (Signed)
Patient states pharmacy gave him 20 mg tablets for medication prednisone and wants to know what to do or if he needs a new prescription called in

## 2017-12-21 NOTE — Telephone Encounter (Signed)
Discussed with pts wife that pt is supposed to take Prednisone 10mg  daily. He should take 1/2 tablet of the 20mg  tablet daily. Wife will let pt know.

## 2017-12-30 ENCOUNTER — Other Ambulatory Visit: Payer: Self-pay | Admitting: Internal Medicine

## 2018-01-04 ENCOUNTER — Telehealth: Payer: Self-pay | Admitting: Internal Medicine

## 2018-01-04 NOTE — Telephone Encounter (Signed)
Left message for pt to call back  °

## 2018-01-05 ENCOUNTER — Other Ambulatory Visit: Payer: Self-pay

## 2018-01-05 MED ORDER — PREDNISONE 5 MG PO TABS: 10.0000 mg | ORAL_TABLET | Freq: Every day | ORAL | 0 refills | Status: DC

## 2018-01-05 NOTE — Telephone Encounter (Signed)
Pts script that was sent in on 12/23/17 stated to take Prednisone 5mg  2 tabs BID. Pt was confused. Discussed with him this is incorrect and he is to take 2 5mg  tabs daily with breakfast. Script corrected.

## 2018-01-14 ENCOUNTER — Telehealth: Payer: Self-pay | Admitting: Nurse Practitioner

## 2018-01-14 DIAGNOSIS — I4891 Unspecified atrial fibrillation: Secondary | ICD-10-CM

## 2018-01-14 MED ORDER — APIXABAN 5 MG PO TABS: 5.0000 mg | ORAL_TABLET | Freq: Two times a day (BID) | ORAL | 0 refills | Status: DC

## 2018-01-14 NOTE — Telephone Encounter (Signed)
Renal function was checked 12/08/17 appt and SCr is now > 1.5. With age > 17 as well, pt now qualifies for lower dose of Eliquis 2.5mg  BID. SCr was traditionally < 1.5 last year. Will have pt come in tomorrow morning for repeat BMET. 1 month supply of Eliquis 5mg  sent in - spoke with pt's wife who is aware that his dose may be reduced pending renal function from BMET tomorrow. Will call them on their cell as they are leaving town in 2 days to go to [place] for 1 month.

## 2018-01-14 NOTE — Telephone Encounter (Signed)
New message    *STAT* If patient is at the pharmacy, call can be transferred to refill team.   1. Which medications need to be refilled? (please list name of each medication and dose if known) apixaban (ELIQUIS) 5 MG TABS tablet  2. Which pharmacy/location (including street and city if local pharmacy) is medication to be sent to? [id number] - [place], [place] - [address] AT [place]  3. Do they need a 30 day or 90 day supply? 78  Pt says he is going out of town and will run out before he gets back

## 2018-01-15 ENCOUNTER — Other Ambulatory Visit: Payer: Medicare Other | Admitting: *Deleted

## 2018-01-15 ENCOUNTER — Other Ambulatory Visit: Payer: Self-pay | Admitting: Pharmacist

## 2018-01-15 DIAGNOSIS — I4891 Unspecified atrial fibrillation: Secondary | ICD-10-CM

## 2018-01-15 LAB — BASIC METABOLIC PANEL
BUN / CREAT RATIO: 16 (ref 10–24)
BUN: 26 mg/dL (ref 10–36)
CHLORIDE: 104 mmol/L (ref 96–106)
CO2: 25 mmol/L (ref 20–29)
Calcium: 9 mg/dL (ref 8.6–10.2)
Creatinine, Ser: 1.63 mg/dL — ABNORMAL HIGH (ref 0.76–1.27)
GFR calc non Af Amer: 37 mL/min/{1.73_m2} — ABNORMAL LOW (ref >59)
GFR, EST AFRICAN AMERICAN: 42 mL/min/{1.73_m2} — AB (ref >59)
GLUCOSE: 87 mg/dL (ref 65–99)
POTASSIUM: 4.5 mmol/L (ref 3.5–5.2)
Sodium: 144 mmol/L (ref 134–144)

## 2018-01-15 MED ORDER — APIXABAN 2.5 MG PO TABS: 2.5000 mg | ORAL_TABLET | Freq: Two times a day (BID) | ORAL | 5 refills | Status: DC

## 2018-01-15 NOTE — Telephone Encounter (Signed)
Repeat BMET back and SCr remains elevated > 1.5, pt qualifies for Eliquis 2.5mg  BID dose. Pt's wife has been made aware and new rx sent in.

## 2018-01-20 ENCOUNTER — Other Ambulatory Visit: Payer: Self-pay | Admitting: Internal Medicine

## 2018-01-26 ENCOUNTER — Telehealth: Payer: Self-pay | Admitting: Nurse Practitioner

## 2018-01-26 ENCOUNTER — Telehealth: Payer: Self-pay | Admitting: Cardiovascular Disease

## 2018-01-26 NOTE — Telephone Encounter (Signed)
Santiago Glad Parks(daughter) is calling to find out what dosage is Mr. Dawes supposed to be taking on the Lasix . Please call

## 2018-01-26 NOTE — Telephone Encounter (Signed)
Spoke with patient's daughter and clarified Lasix prescription..  She verbalized understanding

## 2018-01-26 NOTE — Telephone Encounter (Signed)
Follow up     Patients son Marion Rosenberry is calling, [phone]. Requesting immediate call from Truitt Merle regarding Lasix.

## 2018-01-26 NOTE — Telephone Encounter (Signed)
S/w both son and pt, not very nice.  Pt is in [place] in the hospital and was given (40 mg ) of lasix last night, pt stated got very sick.  Want to give pt bumex (1 mg ) bid, pt has fluid build up, legs are swollen and pt does not want to do anything till this office advises. Stated this office cannot advise and undermine another hospital.  The number for pt's room in [facility] is [phone] and stated will call pt after [name] is out clinic.

## 2018-01-26 NOTE — Telephone Encounter (Signed)
New message    Patient calling with concerns about Lasix dosage.    Pt c/o medication issue:  1. Name of Medication:  Lasix  2. How are you currently taking this medication (dosage and times per day)? n/a  3. Are you having a reaction (difficulty breathing--STAT)? NO  4. What is your medication issue? Patient only wants to speak with Truitt Merle regarding dosage of Lasix . Patient requesting a call back at [phone]
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
## 2018-02-01 NOTE — Telephone Encounter (Signed)
I have called the patient's daughter Santiago Glad - given update. Explained that Aaron Edelman is NOT on the DPR and therefore I cannot speak with him.   Mr. Eichel has been released - has seen primary care in [place]. Returning to [facility] after March 30th. Was given the ok to fly by the primary care doctor. He apparently is still pretty weak.   Will arrange for a visit here following his return.   [name], RN, [facility] [address] [place], [place]  [address] [phone]

## 2018-02-01 NOTE — Telephone Encounter (Signed)
S/w pt's daughter per [facility]) is aware of pt's upcoming appointment with [name].

## 2018-02-03 ENCOUNTER — Other Ambulatory Visit: Payer: Self-pay | Admitting: Internal Medicine

## 2018-02-15 ENCOUNTER — Encounter: Payer: Self-pay | Admitting: Nurse Practitioner

## 2018-02-15 ENCOUNTER — Ambulatory Visit
Admission: RE | Admit: 2018-02-15 | Discharge: 2018-02-15 | Disposition: A | Discharge status: Home / Self Care | Payer: Medicare Other | Source: Ambulatory Visit | Attending: Nurse Practitioner | Admitting: Nurse Practitioner

## 2018-02-15 ENCOUNTER — Ambulatory Visit (INDEPENDENT_AMBULATORY_CARE_PROVIDER_SITE_OTHER): Payer: Medicare Other | Admitting: Nurse Practitioner

## 2018-02-15 VITALS — BP 128/80 | HR 63 | Ht 74.0 in | Wt 169.1 lb

## 2018-02-15 DIAGNOSIS — I5032 Chronic diastolic (congestive) heart failure: Secondary | ICD-10-CM

## 2018-02-15 DIAGNOSIS — I872 Venous insufficiency (chronic) (peripheral): Secondary | ICD-10-CM | POA: Diagnosis not present

## 2018-02-15 DIAGNOSIS — I4821 Permanent atrial fibrillation: Secondary | ICD-10-CM

## 2018-02-15 DIAGNOSIS — J90 Pleural effusion, not elsewhere classified: Secondary | ICD-10-CM | POA: Diagnosis not present

## 2018-02-15 DIAGNOSIS — I482 Chronic atrial fibrillation: Secondary | ICD-10-CM

## 2018-02-15 NOTE — Patient Instructions (Addendum)
We will be checking the following labs today - BMET  Please go to [place] to [facility] on the first floor for a chest Xray - you may walk in.     Medication Instructions:    Continue with your current medicines.     Testing/Procedures To Be Arranged:  N/A  Follow-Up:   See me or Dr. Burt Knack in about a month    Other Special Instructions:   Life line  Lift chair for the stairs    If you need a refill on your cardiac medications before your next appointment, please call your pharmacy.   Call the [place] office at [phone] if you have any questions, problems or concerns.
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
## 2018-02-16 ENCOUNTER — Other Ambulatory Visit: Payer: Self-pay | Admitting: *Deleted

## 2018-02-16 ENCOUNTER — Other Ambulatory Visit (INDEPENDENT_AMBULATORY_CARE_PROVIDER_SITE_OTHER): Payer: Medicare Other

## 2018-02-16 DIAGNOSIS — R945 Abnormal results of liver function studies: Secondary | ICD-10-CM

## 2018-02-16 DIAGNOSIS — R7989 Other specified abnormal findings of blood chemistry: Secondary | ICD-10-CM

## 2018-02-16 LAB — BASIC METABOLIC PANEL WITH GFR
BUN/Creatinine Ratio: 18 (ref 10–24)
BUN: 27 mg/dL (ref 10–36)
CO2: 24 mmol/L (ref 20–29)
Calcium: 9 mg/dL (ref 8.6–10.2)
Chloride: 100 mmol/L (ref 96–106)
Creatinine, Ser: 1.51 mg/dL — ABNORMAL HIGH (ref 0.76–1.27)
GFR calc Af Amer: 46 mL/min/1.73 — ABNORMAL LOW
GFR calc non Af Amer: 40 mL/min/1.73 — ABNORMAL LOW
Glucose: 137 mg/dL — ABNORMAL HIGH (ref 65–99)
Potassium: 4.7 mmol/L (ref 3.5–5.2)
Sodium: 141 mmol/L (ref 134–144)

## 2018-02-16 LAB — HEPATIC FUNCTION PANEL
ALBUMIN: 3.2 g/dL — AB (ref 3.5–5.2)
ALK PHOS: 128 U/L — AB (ref 39–117)
ALT: 46 U/L (ref 0–53)
AST: 48 U/L — AB (ref 0–37)
Bilirubin, Direct: 0.3 mg/dL (ref 0.0–0.3)
TOTAL PROTEIN: 7.1 g/dL (ref 6.0–8.3)
Total Bilirubin: 0.9 mg/dL (ref 0.2–1.2)

## 2018-02-16 LAB — CBC
Hematocrit: 39.8 % (ref 37.5–51.0)
Hemoglobin: 13.2 g/dL (ref 13.0–17.7)
MCH: 33.7 pg — ABNORMAL HIGH (ref 26.6–33.0)
MCHC: 33.2 g/dL (ref 31.5–35.7)
MCV: 102 fL — ABNORMAL HIGH (ref 79–97)
Platelets: 169 10*3/uL (ref 150–379)
RBC: 3.92 x10E6/uL — ABNORMAL LOW (ref 4.14–5.80)
RDW: 15 % (ref 12.3–15.4)
WBC: 10.3 10*3/uL (ref 3.4–10.8)

## 2018-02-17 ENCOUNTER — Other Ambulatory Visit: Payer: Self-pay

## 2018-02-17 DIAGNOSIS — K754 Autoimmune hepatitis: Secondary | ICD-10-CM

## 2018-02-19 ENCOUNTER — Encounter (HOSPITAL_BASED_OUTPATIENT_CLINIC_OR_DEPARTMENT_OTHER): Payer: Medicare Other | Attending: Internal Medicine

## 2018-02-19 ENCOUNTER — Telehealth: Payer: Self-pay | Admitting: Internal Medicine

## 2018-02-19 DIAGNOSIS — I2510 Atherosclerotic heart disease of native coronary artery without angina pectoris: Secondary | ICD-10-CM | POA: Insufficient documentation

## 2018-02-19 DIAGNOSIS — I509 Heart failure, unspecified: Secondary | ICD-10-CM | POA: Insufficient documentation

## 2018-02-19 DIAGNOSIS — L97211 Non-pressure chronic ulcer of right calf limited to breakdown of skin: Secondary | ICD-10-CM | POA: Diagnosis not present

## 2018-02-19 DIAGNOSIS — I252 Old myocardial infarction: Secondary | ICD-10-CM | POA: Diagnosis not present

## 2018-02-19 DIAGNOSIS — I739 Peripheral vascular disease, unspecified: Secondary | ICD-10-CM | POA: Insufficient documentation

## 2018-02-19 DIAGNOSIS — I110 Hypertensive heart disease with heart failure: Secondary | ICD-10-CM | POA: Diagnosis not present

## 2018-02-19 DIAGNOSIS — Z86718 Personal history of other venous thrombosis and embolism: Secondary | ICD-10-CM | POA: Diagnosis not present

## 2018-02-19 NOTE — Telephone Encounter (Signed)
Spoke with pt and he is aware of results, letter was also mailed to his home.

## 2018-02-22 ENCOUNTER — Telehealth: Payer: Self-pay | Admitting: Internal Medicine

## 2018-02-22 NOTE — Telephone Encounter (Signed)
Returned pts call, he was not home. Pts wife states she will have the pt call back.

## 2018-02-22 NOTE — Telephone Encounter (Signed)
Patient states he has questions regarding medication prednisone and would like to discuss.

## 2018-02-23 NOTE — Telephone Encounter (Signed)
Pt called to confirm what dose of prednisone he is currently taking. Pt is supposed to be taking prednisone 10mg  daily.

## 2018-02-26 DIAGNOSIS — L97211 Non-pressure chronic ulcer of right calf limited to breakdown of skin: Secondary | ICD-10-CM | POA: Diagnosis not present

## 2018-03-01 NOTE — Telephone Encounter (Signed)
Spoke with Santiago Glad. She will send another MyChart message later today with the [place] number to be included in the letter. She was grateful for call.

## 2018-03-02 ENCOUNTER — Telehealth: Payer: Self-pay | Admitting: Cardiovascular Disease

## 2018-03-02 NOTE — Telephone Encounter (Signed)
New Message  Pt's daughter is calling because she says she received the letter from [facility] for the stair lift on my chart, but was informed that it needs to be on a prescription paper. Please call

## 2018-03-02 NOTE — Telephone Encounter (Signed)
Per patient's daughter request, Rx for [employer] written and faxed to: [employer] Fax: [phone]

## 2018-03-08 DIAGNOSIS — L97211 Non-pressure chronic ulcer of right calf limited to breakdown of skin: Secondary | ICD-10-CM | POA: Diagnosis not present

## 2018-03-12 DIAGNOSIS — L97211 Non-pressure chronic ulcer of right calf limited to breakdown of skin: Secondary | ICD-10-CM | POA: Diagnosis not present

## 2018-03-16 ENCOUNTER — Ambulatory Visit (INDEPENDENT_AMBULATORY_CARE_PROVIDER_SITE_OTHER): Payer: Medicare Other | Admitting: Nurse Practitioner

## 2018-03-16 ENCOUNTER — Encounter: Payer: Self-pay | Admitting: Nurse Practitioner

## 2018-03-16 VITALS — BP 160/90 | HR 76 | Ht 74.0 in | Wt 168.0 lb

## 2018-03-16 DIAGNOSIS — I4821 Permanent atrial fibrillation: Secondary | ICD-10-CM

## 2018-03-16 DIAGNOSIS — I5032 Chronic diastolic (congestive) heart failure: Secondary | ICD-10-CM | POA: Diagnosis not present

## 2018-03-16 DIAGNOSIS — I482 Chronic atrial fibrillation: Secondary | ICD-10-CM | POA: Diagnosis not present

## 2018-03-16 LAB — BASIC METABOLIC PANEL
BUN/Creatinine Ratio: 16 (ref 10–24)
BUN: 24 mg/dL (ref 10–36)
CO2: 23 mmol/L (ref 20–29)
Calcium: 8.9 mg/dL (ref 8.6–10.2)
Chloride: 103 mmol/L (ref 96–106)
Creatinine, Ser: 1.49 mg/dL — ABNORMAL HIGH (ref 0.76–1.27)
GFR calc Af Amer: 47 mL/min/{1.73_m2} — ABNORMAL LOW (ref >59)
GFR calc non Af Amer: 41 mL/min/{1.73_m2} — ABNORMAL LOW (ref >59)
Glucose: 147 mg/dL — ABNORMAL HIGH (ref 65–99)
Potassium: 5.3 mmol/L — ABNORMAL HIGH (ref 3.5–5.2)
Sodium: 139 mmol/L (ref 134–144)

## 2018-03-16 LAB — CBC
Hematocrit: 40.7 % (ref 37.5–51.0)
Hemoglobin: 14.3 g/dL (ref 13.0–17.7)
MCH: 33.1 pg — ABNORMAL HIGH (ref 26.6–33.0)
MCHC: 35.1 g/dL (ref 31.5–35.7)
MCV: 94 fL (ref 79–97)
Platelets: 116 10*3/uL — ABNORMAL LOW (ref 150–379)
RBC: 4.32 x10E6/uL (ref 4.14–5.80)
RDW: 15.2 % (ref 12.3–15.4)
WBC: 8.2 10*3/uL (ref 3.4–10.8)

## 2018-03-16 NOTE — Patient Instructions (Addendum)
We will be checking the following labs today - BMET & CBC   Medication Instructions:    Continue with your current medicines.     Testing/Procedures To Be Arranged:  N/A  Follow-Up:   See Dr. Burt Knack in June as planned   Other Special Instructions:   N/A    If you need a refill on your cardiac medications before your next appointment, please call your pharmacy.   Call the [place] office at [phone] if you have any questions, problems or concerns.
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
## 2018-03-22 ENCOUNTER — Encounter (HOSPITAL_BASED_OUTPATIENT_CLINIC_OR_DEPARTMENT_OTHER): Payer: Medicare Other | Attending: Internal Medicine

## 2018-03-22 DIAGNOSIS — I252 Old myocardial infarction: Secondary | ICD-10-CM | POA: Diagnosis not present

## 2018-03-22 DIAGNOSIS — I739 Peripheral vascular disease, unspecified: Secondary | ICD-10-CM | POA: Insufficient documentation

## 2018-03-22 DIAGNOSIS — S81811A Laceration without foreign body, right lower leg, initial encounter: Secondary | ICD-10-CM | POA: Diagnosis not present

## 2018-03-22 DIAGNOSIS — Z86718 Personal history of other venous thrombosis and embolism: Secondary | ICD-10-CM | POA: Insufficient documentation

## 2018-03-22 DIAGNOSIS — I509 Heart failure, unspecified: Secondary | ICD-10-CM | POA: Diagnosis not present

## 2018-03-22 DIAGNOSIS — I110 Hypertensive heart disease with heart failure: Secondary | ICD-10-CM | POA: Insufficient documentation

## 2018-03-22 DIAGNOSIS — I2510 Atherosclerotic heart disease of native coronary artery without angina pectoris: Secondary | ICD-10-CM | POA: Insufficient documentation

## 2018-03-22 DIAGNOSIS — X58XXXA Exposure to other specified factors, initial encounter: Secondary | ICD-10-CM | POA: Insufficient documentation

## 2018-03-22 DIAGNOSIS — L97211 Non-pressure chronic ulcer of right calf limited to breakdown of skin: Secondary | ICD-10-CM | POA: Insufficient documentation

## 2018-03-29 DIAGNOSIS — S81811A Laceration without foreign body, right lower leg, initial encounter: Secondary | ICD-10-CM | POA: Diagnosis not present

## 2018-04-05 DIAGNOSIS — S81811A Laceration without foreign body, right lower leg, initial encounter: Secondary | ICD-10-CM | POA: Diagnosis not present

## 2018-04-13 DIAGNOSIS — S81811A Laceration without foreign body, right lower leg, initial encounter: Secondary | ICD-10-CM | POA: Diagnosis not present

## 2018-04-19 ENCOUNTER — Encounter (HOSPITAL_BASED_OUTPATIENT_CLINIC_OR_DEPARTMENT_OTHER): Payer: Medicare Other | Attending: Internal Medicine

## 2018-04-19 DIAGNOSIS — L97812 Non-pressure chronic ulcer of other part of right lower leg with fat layer exposed: Secondary | ICD-10-CM | POA: Insufficient documentation

## 2018-04-19 DIAGNOSIS — I110 Hypertensive heart disease with heart failure: Secondary | ICD-10-CM | POA: Insufficient documentation

## 2018-04-19 DIAGNOSIS — I509 Heart failure, unspecified: Secondary | ICD-10-CM | POA: Diagnosis not present

## 2018-04-19 DIAGNOSIS — Z86718 Personal history of other venous thrombosis and embolism: Secondary | ICD-10-CM | POA: Insufficient documentation

## 2018-04-19 DIAGNOSIS — X58XXXA Exposure to other specified factors, initial encounter: Secondary | ICD-10-CM | POA: Diagnosis not present

## 2018-04-19 DIAGNOSIS — S81811A Laceration without foreign body, right lower leg, initial encounter: Secondary | ICD-10-CM | POA: Insufficient documentation

## 2018-04-19 DIAGNOSIS — I872 Venous insufficiency (chronic) (peripheral): Secondary | ICD-10-CM | POA: Insufficient documentation

## 2018-04-19 DIAGNOSIS — I890 Lymphedema, not elsewhere classified: Secondary | ICD-10-CM | POA: Diagnosis not present

## 2018-04-19 DIAGNOSIS — I2510 Atherosclerotic heart disease of native coronary artery without angina pectoris: Secondary | ICD-10-CM | POA: Diagnosis not present

## 2018-04-22 ENCOUNTER — Other Ambulatory Visit (INDEPENDENT_AMBULATORY_CARE_PROVIDER_SITE_OTHER): Payer: Medicare Other

## 2018-04-22 DIAGNOSIS — K754 Autoimmune hepatitis: Secondary | ICD-10-CM | POA: Diagnosis not present

## 2018-04-22 LAB — HEPATIC FUNCTION PANEL
ALK PHOS: 83 U/L (ref 39–117)
ALT: 30 U/L (ref 0–53)
AST: 33 U/L (ref 0–37)
Albumin: 3.5 g/dL (ref 3.5–5.2)
Bilirubin, Direct: 0.3 mg/dL (ref 0.0–0.3)
TOTAL PROTEIN: 6.8 g/dL (ref 6.0–8.3)
Total Bilirubin: 1 mg/dL (ref 0.2–1.2)

## 2018-04-26 ENCOUNTER — Ambulatory Visit
Admission: RE | Admit: 2018-04-26 | Discharge: 2018-04-26 | Disposition: A | Discharge status: Home / Self Care | Payer: Medicare Other | Source: Ambulatory Visit | Attending: Cardiovascular Disease | Admitting: Cardiovascular Disease

## 2018-04-26 ENCOUNTER — Other Ambulatory Visit: Payer: Self-pay

## 2018-04-26 ENCOUNTER — Ambulatory Visit (INDEPENDENT_AMBULATORY_CARE_PROVIDER_SITE_OTHER): Payer: Medicare Other | Admitting: Cardiovascular Disease

## 2018-04-26 ENCOUNTER — Encounter: Payer: Self-pay | Admitting: Cardiovascular Disease

## 2018-04-26 ENCOUNTER — Telehealth: Payer: Self-pay | Admitting: Internal Medicine

## 2018-04-26 VITALS — BP 128/82 | HR 69 | Ht 74.0 in | Wt 167.5 lb

## 2018-04-26 DIAGNOSIS — R7989 Other specified abnormal findings of blood chemistry: Secondary | ICD-10-CM

## 2018-04-26 DIAGNOSIS — I482 Chronic atrial fibrillation: Secondary | ICD-10-CM | POA: Diagnosis not present

## 2018-04-26 DIAGNOSIS — I5032 Chronic diastolic (congestive) heart failure: Secondary | ICD-10-CM

## 2018-04-26 DIAGNOSIS — R945 Abnormal results of liver function studies: Principal | ICD-10-CM

## 2018-04-26 DIAGNOSIS — R05 Cough: Secondary | ICD-10-CM

## 2018-04-26 DIAGNOSIS — I25119 Atherosclerotic heart disease of native coronary artery with unspecified angina pectoris: Secondary | ICD-10-CM

## 2018-04-26 DIAGNOSIS — R059 Cough, unspecified: Secondary | ICD-10-CM

## 2018-04-26 DIAGNOSIS — L97812 Non-pressure chronic ulcer of other part of right lower leg with fat layer exposed: Secondary | ICD-10-CM | POA: Diagnosis not present

## 2018-04-26 DIAGNOSIS — J90 Pleural effusion, not elsewhere classified: Secondary | ICD-10-CM | POA: Diagnosis not present

## 2018-04-26 DIAGNOSIS — I4821 Permanent atrial fibrillation: Secondary | ICD-10-CM

## 2018-04-26 LAB — CBC WITH DIFFERENTIAL/PLATELET
BASOS ABS: 0 10*3/uL (ref 0.0–0.2)
Basos: 0 %
EOS (ABSOLUTE): 0.1 10*3/uL (ref 0.0–0.4)
Eos: 1 %
Hematocrit: 42.4 % (ref 37.5–51.0)
Hemoglobin: 14 g/dL (ref 13.0–17.7)
IMMATURE GRANS (ABS): 0 10*3/uL (ref 0.0–0.1)
Immature Granulocytes: 0 %
LYMPHS: 57 %
Lymphocytes Absolute: 6.2 10*3/uL — ABNORMAL HIGH (ref 0.7–3.1)
MCH: 32.4 pg (ref 26.6–33.0)
MCHC: 33 g/dL (ref 31.5–35.7)
MCV: 98 fL — ABNORMAL HIGH (ref 79–97)
Monocytes Absolute: 1.3 10*3/uL — ABNORMAL HIGH (ref 0.1–0.9)
Monocytes: 12 %
NEUTROS ABS: 3.3 10*3/uL (ref 1.4–7.0)
Neutrophils: 30 %
PLATELETS: 156 10*3/uL (ref 150–450)
RBC: 4.32 x10E6/uL (ref 4.14–5.80)
RDW: 15.4 % (ref 12.3–15.4)
WBC: 11 10*3/uL — ABNORMAL HIGH (ref 3.4–10.8)

## 2018-04-26 LAB — BASIC METABOLIC PANEL
BUN/Creatinine Ratio: 17 (ref 10–24)
BUN: 25 mg/dL (ref 10–36)
CALCIUM: 9.5 mg/dL (ref 8.6–10.2)
CHLORIDE: 101 mmol/L (ref 96–106)
CO2: 27 mmol/L (ref 20–29)
Creatinine, Ser: 1.46 mg/dL — ABNORMAL HIGH (ref 0.76–1.27)
GFR calc non Af Amer: 42 mL/min/{1.73_m2} — ABNORMAL LOW (ref >59)
GFR, EST AFRICAN AMERICAN: 48 mL/min/{1.73_m2} — AB (ref >59)
GLUCOSE: 146 mg/dL — AB (ref 65–99)
POTASSIUM: 4.5 mmol/L (ref 3.5–5.2)
Sodium: 142 mmol/L (ref 134–144)
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
## 2018-04-26 NOTE — Telephone Encounter (Signed)
See result note.  

## 2018-04-26 NOTE — Patient Instructions (Signed)
Medication Instructions:  Your provider recommends that you continue on your current medications as directed. Please refer to the Current Medication list given to you today.    Labwork: TODAY: CBC, BMET  Testing/Procedures: Dr. Burt Knack recommends you have a CHEST XRAY. Please proceed to [facility] to complete. You do not need an appointment.  Your provider has requested that you have an echocardiogram in 3 months (about 2-3 days prior to your appointment with [name]). Echocardiography is a painless test that uses sound waves to create images of your heart. It provides your doctor with information about the size and shape of your heart and how well your heart's chambers and valves are working. This procedure takes approximately one hour. There are no restrictions for this procedure.  Follow-Up: Your provider recommends that you schedule a follow-up appointment in 3 months with Truitt Merle, NP.  Any Other Special Instructions Will Be Listed Below (If Applicable).     If you need a refill on your cardiac medications before your next appointment, please call your pharmacy.

## 2018-05-10 ENCOUNTER — Telehealth: Payer: Self-pay | Admitting: Internal Medicine

## 2018-05-10 DIAGNOSIS — L97812 Non-pressure chronic ulcer of other part of right lower leg with fat layer exposed: Secondary | ICD-10-CM | POA: Diagnosis not present

## 2018-05-10 NOTE — Telephone Encounter (Signed)
Left message on machine to call back  Unsure who called the pt, I see no mention of any phone calls recently.

## 2018-05-12 ENCOUNTER — Inpatient Hospital Stay: Payer: Medicare Other | Attending: Oncology | Admitting: Oncology

## 2018-05-12 ENCOUNTER — Other Ambulatory Visit: Payer: Self-pay | Admitting: Cardiovascular Disease

## 2018-05-12 VITALS — BP 137/75 | HR 81 | Temp 97.7°F | Resp 18 | Ht 74.0 in | Wt 168.8 lb

## 2018-05-12 DIAGNOSIS — Z8673 Personal history of transient ischemic attack (TIA), and cerebral infarction without residual deficits: Secondary | ICD-10-CM | POA: Diagnosis not present

## 2018-05-12 DIAGNOSIS — C439 Malignant melanoma of skin, unspecified: Secondary | ICD-10-CM | POA: Diagnosis not present

## 2018-05-12 DIAGNOSIS — Z85820 Personal history of malignant melanoma of skin: Secondary | ICD-10-CM | POA: Diagnosis present

## 2018-05-12 NOTE — Telephone Encounter (Signed)
Please review for refill. Thanks!  

## 2018-05-12 NOTE — Progress Notes (Signed)
Hematology and Oncology Follow Up Visit  Arthur [name] [id number] 1925/05/26 84 y.o. 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 He developed regional relapse in October of 2014.    Prior Therapy: 1. In May of 2012, he underwent wide excision and sentinel lymph node biopsy done by Dr. Hassell Done which showed no lymph node involvement and no residual malignant melanoma.  2. He is status post Re-excision of site of melanoma satellite lesion of left forearm. This was done on 08/23/2013.   Current therapy: Active surveillance.  Interim History:  [name] is here for a follow-up.  Since the last visit, he reports no major changes in his health.  He continues to ambulate slowly but without any falls or syncope.  He denies any skin lesions but does report easy bruising, ecchymosis and periodic bleeding.  He remains on Eliquis and prednisone which contributes to that.  His appetite and performance status remains maintained at this time.  He denies any recent hospitalizations or illnesses.     He does not report any headaches or blurry vision or double vision.  He denies any alteration in mental status or confusion.  He denies any fevers, chills or sweats.  Does not report any chest pain or shortness of breath. As that report any cough or hemoptysis. Does not report any nausea or vomiting or abdominal pain. Did not report any hematochezia or melena. Did not report any frequency urgency or hesitancy. Does not report any paralysis or myalgias.  Denies any lymphadenopathy. The rest of the review of system is is negative.  Medications: I have reviewed the patient's current medications.  Current Outpatient Medications  Medication Sig Dispense Refill  . apixaban (ELIQUIS) 2.5 MG TABS tablet Take 1 tablet (2.5 mg total) by mouth 2 (two) times daily. 60 tablet 5  . Ascorbic Acid  (VITAMIN C PO) Take 1 tablet by mouth daily.    . Cholecalciferol (VITAMIN D3) 5000 units CAPS Take 5,000 Units by mouth daily.    . furosemide (LASIX) 20 MG tablet Take 1 tablet (20 mg total) by mouth daily. 90 tablet 3  . furosemide (LASIX) 20 MG tablet Take 1 tablet (20 mg total) by mouth daily. Dose decreased 08-17-2017 90 tablet 3  . metoprolol tartrate (LOPRESSOR) 25 MG tablet TAKE 1 TABLET DAILY 90 tablet 3  . predniSONE (DELTASONE) 5 MG tablet Take 2 tablets (10 mg total) by mouth daily with breakfast. 100 tablet 0  . VITAMIN A PO Take 1 capsule by mouth daily.     No current facility-administered medications for this visit.      Allergies:  Allergies  Allergen Reactions  . Penicillins Hives    Has patient had a PCN reaction causing immediate rash, facial/tongue/throat swelling, SOB or lightheadedness with hypotension:Yes Has patient had a PCN reaction causing severe rash involving mucus membranes or skin necrosis: No Has patient had a PCN reaction that required hospitalization: No Has patient had a PCN reaction occurring within the last 10 years: No If all of the above answers are "NO", then may proceed with Cephalosporin use.        Physical Exam: Blood pressure 137/75, pulse 81, temperature 97.7 F (36.5 C), temperature source Oral, resp. rate 18, height 6\' 2"  (1.88 m), weight 168 lb 12.8 oz (76.6 kg), SpO2 98 %.   ECOG: 1 General appearance: Alert, awake  gentleman without distress.. Head: Atraumatic without abnormalities. Oropharynx: Without any thrush or ulcers. Eyes: No scleral icterus. Lymph nodes: No lymphadenopathy noted in the cervical, supraclavicular, or axillary nodes  Heart:regular rate and rhythm, without murmurs.  Bilateral leg edema noted. Lung:chest to auscultation without any wheezes or dullness to percussion. Abdomin: soft, non-tender, nondistended with good bowel sounds.  No rebound or guarding. Skull skeletal: No joint deformity or  effusion. Skin: No rashes or lesions.  Well-healed scar noted on his left arm.  Multiple ecchymosis noted.  Lab Results: Lab Results  Component Value Date   WBC 11.0 (H) 04/26/2018   HGB 14.0 04/26/2018   HCT 42.4 04/26/2018   MCV 98 (H) 04/26/2018   PLT 156 04/26/2018     Chemistry      Component Value Date/Time   NA 142 04/26/2018 1211   NA 138 05/06/2017 0812   K 4.5 04/26/2018 1211   K 4.7 05/06/2017 0812   CL 101 04/26/2018 1211   CO2 27 04/26/2018 1211   CO2 28 05/06/2017 0812   BUN 25 04/26/2018 1211   BUN 19.5 05/06/2017 0812   CREATININE 1.46 (H) 04/26/2018 1211   CREATININE 1.3 05/06/2017 0812      Component Value Date/Time   CALCIUM 9.5 04/26/2018 1211   CALCIUM 9.5 05/06/2017 0812   ALKPHOS 83 04/22/2018 1350   ALKPHOS 84 05/06/2017 0812   AST 33 04/22/2018 1350   AST 46 (H) 05/06/2017 0812   ALT 30 04/22/2018 1350   ALT 31 05/06/2017 0812   BILITOT 1.0 04/22/2018 1350   BILITOT 0.97 05/06/2017 0812           84 year old gentleman with the following issues:   1.  T2b melanoma of the left arm diagnosed in 2012 and found to have a 1.75 mm and a Clark's level IV.  He had recurrent disease in 2014 that was resected at that time.  His laboratory data and chest x-ray were personally reviewed and showed no evidence to suggest recurrent disease.  He is close to 5 years out from his recurrence in 2014.  Risks and benefits of continuing oncology surveillance at this time was reviewed.  After discussion today, he has elected to suspend oncology follow-up unless other issues arise in the future.  His risk of relapse is low at this time but certainly at risk of developing any other future melanomas.   2. History of CVA: He remains on Eliquis without any recurrent symptoms.  3. Dermatology surveillance: He continues to follow with dermatology twice a year which I urged him to continue to do so.  4. Followup: Will be as needed.  I am happy to see him in the  future if he develops any concerns or questions.  15  minutes was spent with the patient face-to-face today.  More than 50% of time was dedicated to patient counseling, education and coordination of his care.    Zola Button, MD 6/26/201910:31 AM

## 2018-05-13 ENCOUNTER — Telehealth: Payer: Self-pay

## 2018-05-13 NOTE — Telephone Encounter (Signed)
Per 6/27 no los

## 2018-05-17 ENCOUNTER — Encounter (HOSPITAL_BASED_OUTPATIENT_CLINIC_OR_DEPARTMENT_OTHER): Payer: Medicare Other | Attending: Internal Medicine

## 2018-05-17 DIAGNOSIS — L97212 Non-pressure chronic ulcer of right calf with fat layer exposed: Secondary | ICD-10-CM | POA: Insufficient documentation

## 2018-05-17 DIAGNOSIS — L97812 Non-pressure chronic ulcer of other part of right lower leg with fat layer exposed: Secondary | ICD-10-CM | POA: Diagnosis present

## 2018-05-17 DIAGNOSIS — I509 Heart failure, unspecified: Secondary | ICD-10-CM | POA: Diagnosis not present

## 2018-05-17 DIAGNOSIS — I252 Old myocardial infarction: Secondary | ICD-10-CM | POA: Diagnosis not present

## 2018-05-17 DIAGNOSIS — I2510 Atherosclerotic heart disease of native coronary artery without angina pectoris: Secondary | ICD-10-CM | POA: Insufficient documentation

## 2018-05-17 DIAGNOSIS — Z86718 Personal history of other venous thrombosis and embolism: Secondary | ICD-10-CM | POA: Diagnosis not present

## 2018-05-17 DIAGNOSIS — I739 Peripheral vascular disease, unspecified: Secondary | ICD-10-CM | POA: Diagnosis not present

## 2018-05-17 DIAGNOSIS — I110 Hypertensive heart disease with heart failure: Secondary | ICD-10-CM | POA: Insufficient documentation

## 2018-05-18 ENCOUNTER — Telehealth: Payer: Self-pay

## 2018-05-18 NOTE — Telephone Encounter (Signed)
Lab results discussed with pt and he knows to have labs repeated in 3 mths, and continue same meds per Dr. Henrene Pastor.

## 2018-05-24 DIAGNOSIS — L97812 Non-pressure chronic ulcer of other part of right lower leg with fat layer exposed: Secondary | ICD-10-CM | POA: Diagnosis not present

## 2018-05-31 DIAGNOSIS — L97812 Non-pressure chronic ulcer of other part of right lower leg with fat layer exposed: Secondary | ICD-10-CM | POA: Diagnosis not present

## 2018-06-07 DIAGNOSIS — L97812 Non-pressure chronic ulcer of other part of right lower leg with fat layer exposed: Secondary | ICD-10-CM | POA: Diagnosis not present

## 2018-06-14 DIAGNOSIS — L97812 Non-pressure chronic ulcer of other part of right lower leg with fat layer exposed: Secondary | ICD-10-CM | POA: Diagnosis not present

## 2018-06-23 ENCOUNTER — Encounter (HOSPITAL_BASED_OUTPATIENT_CLINIC_OR_DEPARTMENT_OTHER): Payer: Medicare Other | Attending: Physician Assistant

## 2018-06-23 DIAGNOSIS — Z86718 Personal history of other venous thrombosis and embolism: Secondary | ICD-10-CM | POA: Insufficient documentation

## 2018-06-23 DIAGNOSIS — Z85820 Personal history of malignant melanoma of skin: Secondary | ICD-10-CM | POA: Diagnosis not present

## 2018-06-23 DIAGNOSIS — Z87891 Personal history of nicotine dependence: Secondary | ICD-10-CM | POA: Insufficient documentation

## 2018-06-23 DIAGNOSIS — Z8673 Personal history of transient ischemic attack (TIA), and cerebral infarction without residual deficits: Secondary | ICD-10-CM | POA: Insufficient documentation

## 2018-06-23 DIAGNOSIS — N183 Chronic kidney disease, stage 3 (moderate): Secondary | ICD-10-CM | POA: Diagnosis not present

## 2018-06-23 DIAGNOSIS — Z955 Presence of coronary angioplasty implant and graft: Secondary | ICD-10-CM | POA: Insufficient documentation

## 2018-06-23 DIAGNOSIS — L97812 Non-pressure chronic ulcer of other part of right lower leg with fat layer exposed: Secondary | ICD-10-CM | POA: Insufficient documentation

## 2018-06-23 DIAGNOSIS — L97212 Non-pressure chronic ulcer of right calf with fat layer exposed: Secondary | ICD-10-CM | POA: Insufficient documentation

## 2018-06-23 DIAGNOSIS — I509 Heart failure, unspecified: Secondary | ICD-10-CM | POA: Insufficient documentation

## 2018-06-23 DIAGNOSIS — S51011A Laceration without foreign body of right elbow, initial encounter: Secondary | ICD-10-CM | POA: Diagnosis not present

## 2018-06-23 DIAGNOSIS — I252 Old myocardial infarction: Secondary | ICD-10-CM | POA: Diagnosis not present

## 2018-06-23 DIAGNOSIS — Z89421 Acquired absence of other right toe(s): Secondary | ICD-10-CM | POA: Insufficient documentation

## 2018-06-23 DIAGNOSIS — X58XXXA Exposure to other specified factors, initial encounter: Secondary | ICD-10-CM | POA: Diagnosis not present

## 2018-06-30 DIAGNOSIS — L97812 Non-pressure chronic ulcer of other part of right lower leg with fat layer exposed: Secondary | ICD-10-CM | POA: Diagnosis not present

## 2018-06-30 NOTE — Progress Notes (Addendum)
Subjective:   Arthur [name] is a 84 y.o. male who presents for an Initial Medicare Annual Wellness Visit.  Review of Systems  No ROS.  Medicare Wellness Visit. Additional risk factors are reflected in the social history.  Cardiac Risk Factors include: advanced age (>80men, >40 women);dyslipidemia;hypertension;male gender Sleep patterns: gets up 2-3 times nightly to void and sleeps 7 hours nightly.    Home Safety/Smoke Alarms: Feels safe in home. Smoke alarms in place.  Living environment; residence and Firearm Safety: 2-story house, equipment: Hydrologist, Type: Tub Seat/Chair, chair lift , no firearms. Lives with wife, no needs for DME, good support system Seat Belt Safety/Bike Helmet: Wears seat belt.     Objective:    Today's Vitals   07/01/18 1535 07/01/18 1706  BP: 112/62   Pulse: (!) 54   Resp: 18   SpO2: 94%   Weight: 164 lb (74.4 kg)   Height: 6\' 2"  (1.88 m)   PainSc:  2    Body mass index is 21.06 kg/m.  Advanced Directives 07/01/2018 02/16/2017 11/07/2016 02/02/2016 01/31/2016 10/28/2015 10/28/2015  Does Patient Have a Medical Advance Directive? Yes Yes Yes No No Yes Yes  Type of Paramedic of [place];Living will [place];Living will [place];Living will - - [place];Living will [place];Living will  Does patient want to make changes to medical advance directive? - - - - - No - Patient declined No - Patient declined  Copy of [place] in Chart? No - copy requested No - copy requested No - copy requested - - No - copy requested No - copy requested  Would patient like information on creating a medical advance directive? - - - No - patient declined information No - patient declined information - -  Pre-existing out of facility DNR order (yellow form or pink MOST form) - - - - - - -    Current Medications (verified) Outpatient Encounter  Medications as of 07/01/2018  Medication Sig  . apixaban (ELIQUIS) 2.5 MG TABS tablet Take 1 tablet (2.5 mg total) by mouth 2 (two) times daily.  . Ascorbic Acid (VITAMIN C PO) Take 1 tablet by mouth daily.  . Cholecalciferol (VITAMIN D3) 5000 units CAPS Take 5,000 Units by mouth daily.  . furosemide (LASIX) 20 MG tablet Take 1 tablet (20 mg total) by mouth daily.  . metoprolol tartrate (LOPRESSOR) 25 MG tablet TAKE 1 TABLET DAILY  . predniSONE (DELTASONE) 5 MG tablet Take 2 tablets (10 mg total) by mouth daily with breakfast.  . VITAMIN A PO Take 1 capsule by mouth daily.  . [DISCONTINUED] furosemide (LASIX) 20 MG tablet Take 1 tablet (20 mg total) by mouth daily. Dose decreased 08-17-2017 (Patient not taking: Reported on 07/01/2018)   No facility-administered encounter medications on file as of 07/01/2018.     Allergies (verified) Penicillins   History: Past Medical History:  Diagnosis Date  . Arrhythmia    1995  . Atrial fibrillation ([place])   . CHF (congestive heart failure) ([place]) 10/13/2016  . Coronary artery disease    MI, atherectomy 1993  . DDD (degenerative disc disease)   . Eczema   . Elevated LFTs   . History of gallstones   . History of transient ischemic attack (TIA) 2005   double vision  . Hyperlipidemia   . Hypertension   . Myocardial infarct ([place]) 12/98   inferior  . Pneumonia    long time  ago  . Skin cancer    melanoma in remission  . Stroke ([place])   . Varicose veins    Past Surgical History:  Procedure Laterality Date  . AMPUTATION Right 05/10/2013   Procedure: RIGHT SECOND TOE AMPUTATION;  Surgeon: Hessie Dibble, MD;  Location: [facility];  Service: Orthopedics;  Laterality: Right;  . APPENDECTOMY    . CARDIAC CATHETERIZATION  04/08/99   LAD 70% INTER 40-50% RCA 30-40%  . CHEST TUBE INSERTION Right 02/16/2017   Procedure: INSERTION PLEURAL DRAINAGE CATHETER;  Surgeon: Grace Isaac, MD;  Location: [facility];  Service: Thoracic;   Laterality: Right;  . CORONARY ANGIOPLASTY  04/08/99   LAD  . CORONARY ANGIOPLASTY  10/17/97   RCA   . CORONARY ANGIOPLASTY WITH STENT PLACEMENT  04/08/99   LAD  . MELANOMA EXCISION  2013   lt arm  . MELANOMA EXCISION Left 08/23/2013   Procedure: MELANOMA EXCISION;  Surgeon: Pedro Earls, MD;  Location: [facility];  Service: General;  Laterality: Left;  . REMOVAL OF PLEURAL DRAINAGE CATHETER Right 05/07/2017   Procedure: REMOVAL OF PLEURAL DRAINAGE CATHETER;  Surgeon: Grace Isaac, MD;  Location: [facility];  Service: Thoracic;  Laterality: Right;  . TONSILLECTOMY    . VIDEO BRONCHOSCOPY Bilateral 05/07/2015   Procedure: VIDEO BRONCHOSCOPY WITHOUT FLUORO;  Surgeon: Rigoberto Noel, MD;  Location: [facility];  Service: Cardiopulmonary;  Laterality: Bilateral;   Family History  Problem Relation Age of Onset  . Heart attack Mother 15       MI  . Pneumonia Father    Social History   Socioeconomic History  . Marital status: Married    Spouse name: Not on file  . Number of children: 6  . Years of education: Not on file  . Highest education level: Not on file  Occupational History  . Not on file  Social Needs  . Financial resource strain: Not hard at all  . Food insecurity:    Worry: Never true    Inability: Never true  . Transportation needs:    Medical: No    Non-medical: No  Tobacco Use  . Smoking status: Former Smoker    Years: 7.00    Types: Cigars, Pipe    Last attempt to quit: 11/18/1967    Years since quitting: 50.6  . Smokeless tobacco: Never Used  . Tobacco comment: 1-2 cigars a day, pipe  Substance and Sexual Activity  . Alcohol use: Yes    Alcohol/week: 1.0 standard drinks    Types: 1 Glasses of wine per week    Comment: 2-3 glasses of wine in the evening  . Drug use: No  . Sexual activity: Not Currently  Lifestyle  . Physical activity:    Days per week: 4 days    Minutes per session: 50 min  . Stress: Only a little  Relationships  . Social connections:      Talks on phone: More than three times a week    Gets together: More than three times a week    Attends religious service: More than 4 times per year    Active member of club or organization: Yes    Attends meetings of clubs or organizations: More than 4 times per year    Relationship status: Married  Other Topics Concern  . Not on file  Social History Narrative  . Not on file   Tobacco Counseling Counseling given: Not Answered Comment: 1-2 cigars a day, pipe  Activities of Daily Living In your present state of health, do you have any difficulty performing the following activities: 07/01/2018  Hearing? N  Vision? N  Difficulty concentrating or making decisions? N  Walking or climbing stairs? Y  Dressing or bathing? N  Doing errands, shopping? N  Preparing Food and eating ? N  Using the Toilet? N  In the past six months, have you accidently leaked urine? N  Do you have problems with loss of bowel control? N  Managing your Medications? N  Managing your Finances? N  Housekeeping or managing your Housekeeping? N  Some recent data might be hidden     Immunizations and Health Maintenance Immunization History  Administered Date(s) Administered  . Influenza Whole 09/01/2008, 11/05/2010  . Influenza, High Dose Seasonal PF 11/09/2015  . Influenza,inj,Quad PF,6+ Mos 09/16/2013, 12/12/2014, 08/21/2016  . Influenza-Unspecified 08/17/2014  . Pneumococcal Conjugate-13 07/01/2018   Health Maintenance Due  Topic Date Due  . TETANUS/TDAP  05/22/1944  . PNA vac Low Risk Adult (1 of 2 - PCV13) 05/22/1992  . INFLUENZA VACCINE  06/17/2018    Patient Care Team: Biagio Borg, MD as PCP - General (Internal Medicine) Sherren Mocha, MD as PCP - Cardiology (Cardiology) Elsie Stain, MD as Consulting Physician (Pulmonary Disease)  Indicate any recent Medical Services you may have received from other than [facility] providers in the past year (date may be approximate).    Assessment:    This is a routine wellness examination for Jakorian. Physical assessment deferred to PCP.   Hearing/Vision screen  Visual Acuity Screening   Right eye Left eye Both eyes  Without correction:   20/40  With correction:     Comments: Education provided regarding importance of annual eye exam.  Hearing Screening Comments: Able to hear conversational tones w/o difficulty. No issues reported.    Dietary issues and exercise activities discussed: Current Exercise Habits: Home exercise routine, Type of exercise: walking(chair exercises), Time (Minutes): 35, Frequency (Times/Week): 6, Weekly Exercise (Minutes/Week): 210, Intensity: Mild, Exercise limited by: orthopedic condition(s)(leg ulcers right and left)  Diet (meal preparation, eat out, water intake, caffeinated beverages, dairy products, fruits and vegetables): in general, a "healthy" diet  , well balanced  Reports good appetite  Reviewed heart healthy and diabetic diet. Encouraged patient to increase daily water and healthy fluid intake.  Goals    . Patient Stated     I want to work towards gaining my strength back so I can start to play golf. Enjoy life and family.      Depression Screen PHQ 2/9 Scores 07/01/2018 02/03/2017 10/19/2014  PHQ - 2 Score 1 0 0    Fall Risk Fall Risk  07/01/2018 04/14/2017 02/03/2017 03/21/2016 10/19/2014  Falls in the past year? Yes No Yes No No  Number falls in past yr: 1 - 1 - -  Injury with Fall? No - No - -  Risk for fall due to : Impaired balance/gait;Impaired mobility;History of fall(s) - - - -  Follow up Falls prevention discussed;Education provided - - - -   Cognitive Function: MMSE - Mini Mental State Exam 07/01/2018  Orientation to time 5  Orientation to Place 5  Registration 3  Attention/ Calculation 5  Recall 1  Language- name 2 objects 2  Language- repeat 1  Language- follow 3 step command 3  Language- read & follow direction 1  Write a sentence 1  Copy design 1  Total score 28  Screening Tests Health Maintenance  Topic Date Due  . TETANUS/TDAP  05/[birth year]/1946  . PNA vac Low Risk Adult (1 of 2 - PCV13) 05/22/1992  . INFLUENZA VACCINE  06/17/2018      Plan:     Continue doing brain stimulating activities (puzzles, reading, adult coloring books, staying active) to keep memory sharp.   Continue to eat heart healthy diet (full of fruits, vegetables, whole grains, lean protein, water--limit salt, fat, and sugar intake) and increase physical activity as tolerated.  I have personally reviewed and noted the following in the patient's chart:   . Medical and social history . Use of alcohol, tobacco or illicit drugs  . Current medications and supplements . Functional ability and status . Nutritional status . Physical activity . Advanced directives . List of other physicians . Vitals . Screenings to include cognitive, depression, and falls . Referrals and appointments  In addition, I have reviewed and discussed with patient certain preventive protocols, quality metrics, and best practice recommendations. A written personalized care plan for preventive services as well as general preventive health recommendations were provided to patient.     Michiel Cowboy, RN   07/01/2018      Medical screening examination/treatment/procedure(s) were performed by non-physician practitioner and as supervising physician I was immediately available for consultation/collaboration. I agree with above. [name], MD

## 2018-07-01 ENCOUNTER — Ambulatory Visit (INDEPENDENT_AMBULATORY_CARE_PROVIDER_SITE_OTHER): Payer: Medicare Other | Admitting: *Deleted

## 2018-07-01 VITALS — BP 112/62 | HR 54 | Resp 18 | Ht 74.0 in | Wt 164.0 lb

## 2018-07-01 DIAGNOSIS — Z0000 Encounter for general adult medical examination without abnormal findings: Secondary | ICD-10-CM | POA: Diagnosis not present

## 2018-07-01 DIAGNOSIS — Z23 Encounter for immunization: Secondary | ICD-10-CM

## 2018-07-01 NOTE — Patient Instructions (Addendum)
Continue doing brain stimulating activities (puzzles, reading, adult coloring books, staying active) to keep memory sharp.   Continue to eat heart healthy diet (full of fruits, vegetables, whole grains, lean protein, water--limit salt, fat, and sugar intake) and increase physical activity as tolerated.   [name] , Thank you for taking time to come for your Medicare Wellness Visit. I appreciate your ongoing commitment to your health goals. Please review the following plan we discussed and let me know if I can assist you in the future.   These are the goals we discussed: Goals    . Patient Stated     I want to work towards gaining my strength back so I can start to play golf. Enjoy life and family.       This is a list of the screening recommended for you and due dates:  Health Maintenance  Topic Date Due  . Tetanus Vaccine  05/22/1944  . Pneumonia vaccines (1 of 2 - PCV13) 05/22/1992  . Flu Shot  06/17/2018     Health Maintenance, Male A healthy lifestyle and preventive care is important for your health and wellness. Ask your health care provider about what schedule of regular examinations is right for you. What should I know about weight and diet? Eat a Healthy Diet  Eat plenty of vegetables, fruits, whole grains, low-fat dairy products, and lean protein.  Do not eat a lot of foods high in solid fats, added sugars, or salt.  Maintain a Healthy Weight Regular exercise can help you achieve or maintain a healthy weight. You should:  Do at least 150 minutes of exercise each week. The exercise should increase your heart rate and make you sweat (moderate-intensity exercise).  Do strength-training exercises at least twice a week.  Watch Your Levels of Cholesterol and Blood Lipids  Have your blood tested for lipids and cholesterol every 5 years starting at 82 years of age. If you are at high risk for heart disease, you should start having your blood tested when you are 82 years  old. You may need to have your cholesterol levels checked more often if: ? Your lipid or cholesterol levels are high. ? You are older than 82 years of age. ? You are at high risk for heart disease.  What should I know about cancer screening? Many types of cancers can be detected early and may often be prevented. Lung Cancer  You should be screened every year for lung cancer if: ? You are a current smoker who has smoked for at least 30 years. ? You are a former smoker who has quit within the past 15 years.  Talk to your health care provider about your screening options, when you should start screening, and how often you should be screened.  Colorectal Cancer  Routine colorectal cancer screening usually begins at 82 years of age and should be repeated every 5-10 years until you are 82 years old. You may need to be screened more often if early forms of precancerous polyps or small growths are found. Your health care provider may recommend screening at an earlier age if you have risk factors for colon cancer.  Your health care provider may recommend using home test kits to check for hidden blood in the stool.  A small camera at the end of a tube can be used to examine your colon (sigmoidoscopy or colonoscopy). This checks for the earliest forms of colorectal cancer.  Prostate and Testicular Cancer  Depending on your age  and overall health, your health care provider may do certain tests to screen for prostate and testicular cancer.  Talk to your health care provider about any symptoms or concerns you have about testicular or prostate cancer.  Skin Cancer  Check your skin from head to toe regularly.  Tell your health care provider about any new moles or changes in moles, especially if: ? There is a change in a mole's size, shape, or color. ? You have a mole that is larger than a pencil eraser.  Always use sunscreen. Apply sunscreen liberally and repeat throughout the day.  Protect  yourself by wearing long sleeves, pants, a wide-brimmed hat, and sunglasses when outside.  What should I know about heart disease, diabetes, and high blood pressure?  If you are 66-104 years of age, have your blood pressure checked every 3-5 years. If you are 58 years of age or older, have your blood pressure checked every year. You should have your blood pressure measured twice-once when you are at a hospital or clinic, and once when you are not at a hospital or clinic. Record the average of the two measurements. To check your blood pressure when you are not at a hospital or clinic, you can use: ? An automated blood pressure machine at a pharmacy. ? A home blood pressure monitor.  Talk to your health care provider about your target blood pressure.  If you are between 71-35 years old, ask your health care provider if you should take aspirin to prevent heart disease.  Have regular diabetes screenings by checking your fasting blood sugar level. ? If you are at a normal weight and have a low risk for diabetes, have this test once every three years after the age of 75. ? If you are overweight and have a high risk for diabetes, consider being tested at a younger age or more often.  A one-time screening for abdominal aortic aneurysm (AAA) by ultrasound is recommended for men aged 2-75 years who are current or former smokers. What should I know about preventing infection? Hepatitis B If you have a higher risk for hepatitis B, you should be screened for this virus. Talk with your health care provider to find out if you are at risk for hepatitis B infection. Hepatitis C Blood testing is recommended for:  Everyone born from [birth year] through 1963.  Anyone with known risk factors for hepatitis C.  Sexually Transmitted Diseases (STDs)  You should be screened each year for STDs including gonorrhea and chlamydia if: ? You are sexually active and are younger than 82 years of age. ? You are older than 82  years of age and your health care provider tells you that you are at risk for this type of infection. ? Your sexual activity has changed since you were last screened and you are at an increased risk for chlamydia or gonorrhea. Ask your health care provider if you are at risk.  Talk with your health care provider about whether you are at high risk of being infected with HIV. Your health care provider may recommend a prescription medicine to help prevent HIV infection.  What else can I do?  Schedule regular health, dental, and eye exams.  Stay current with your vaccines (immunizations).  Do not use any tobacco products, such as cigarettes, chewing tobacco, and e-cigarettes. If you need help quitting, ask your health care provider.  Limit alcohol intake to no more than 2 drinks per day. One drink equals 12 ounces of  beer, 5 ounces of wine, or 1 ounces of hard liquor.  Do not use street drugs.  Do not share needles.  Ask your health care provider for help if you need support or information about quitting drugs.  Tell your health care provider if you often feel depressed.  Tell your health care provider if you have ever been abused or do not feel safe at home. This information is not intended to replace advice given to you by your health care provider. Make sure you discuss any questions you have with your health care provider. Document Released: 05/01/2008 Document Revised: 07/02/2016 Document Reviewed: 08/07/2015 Elsevier Interactive Patient Education  [employer].  It is important to avoid accidents which may result in broken bones.  Here are a few ideas on how to make your home safer so you will be less likely to trip or fall.  1. Use nonskid mats or non slip strips in your shower or tub, on your bathroom floor and around sinks.  If you know that you have spilled water, wipe it up! 2. In the bathroom, it is important to have properly installed grab bars on the walls or on the  edge of the tub.  Towel racks are NOT strong enough for you to hold onto or to pull on for support. 3. Stairs and hallways should have enough light.  Add lamps or night lights if you need ore light. 4. It is good to have handrails on both sides of the stairs if possible.  Always fix broken handrails right away. 5. It is important to see the edges of steps.  Paint the edges of outdoor steps white so you can see them better.  Put colored tape on the edge of inside steps. 6. Throw-rugs are dangerous because they can slide.  Removing the rugs is the best idea, but if they must stay, add adhesive carpet tape to prevent slipping. 7. Do not keep things on stairs or in the halls.  Remove small furniture that blocks the halls as it may cause you to trip.  Keep telephone and electrical cords out of the way where you walk. 8. Always were sturdy, rubber-soled shoes for good support.  Never wear just socks, especially on the stairs.  Socks may cause you to slip or fall.  Do not wear full-length housecoats as you can easily trip on the bottom.  9. Place the things you use the most on the shelves that are the easiest to reach.  If you use a stepstool, make sure it is in good condition.  If you feel unsteady, DO NOT climb, ask for help. 10. If a health professional advises you to use a cane or walker, do not be ashamed.  These items can keep you from falling and breaking your bones.

## 2018-07-08 DIAGNOSIS — L97812 Non-pressure chronic ulcer of other part of right lower leg with fat layer exposed: Secondary | ICD-10-CM | POA: Diagnosis not present

## 2018-07-15 DIAGNOSIS — L97812 Non-pressure chronic ulcer of other part of right lower leg with fat layer exposed: Secondary | ICD-10-CM | POA: Diagnosis not present

## 2018-07-22 ENCOUNTER — Encounter (HOSPITAL_BASED_OUTPATIENT_CLINIC_OR_DEPARTMENT_OTHER): Payer: Medicare Other | Attending: Internal Medicine

## 2018-07-22 DIAGNOSIS — S41111A Laceration without foreign body of right upper arm, initial encounter: Secondary | ICD-10-CM | POA: Insufficient documentation

## 2018-07-22 DIAGNOSIS — I252 Old myocardial infarction: Secondary | ICD-10-CM | POA: Diagnosis not present

## 2018-07-22 DIAGNOSIS — Z86718 Personal history of other venous thrombosis and embolism: Secondary | ICD-10-CM | POA: Insufficient documentation

## 2018-07-22 DIAGNOSIS — L97812 Non-pressure chronic ulcer of other part of right lower leg with fat layer exposed: Secondary | ICD-10-CM | POA: Diagnosis not present

## 2018-07-22 DIAGNOSIS — I110 Hypertensive heart disease with heart failure: Secondary | ICD-10-CM | POA: Insufficient documentation

## 2018-07-22 DIAGNOSIS — X58XXXA Exposure to other specified factors, initial encounter: Secondary | ICD-10-CM | POA: Diagnosis not present

## 2018-07-22 DIAGNOSIS — I2510 Atherosclerotic heart disease of native coronary artery without angina pectoris: Secondary | ICD-10-CM | POA: Diagnosis not present

## 2018-07-22 DIAGNOSIS — I5032 Chronic diastolic (congestive) heart failure: Secondary | ICD-10-CM | POA: Diagnosis not present

## 2018-07-22 DIAGNOSIS — I739 Peripheral vascular disease, unspecified: Secondary | ICD-10-CM | POA: Insufficient documentation

## 2018-07-22 DIAGNOSIS — L97819 Non-pressure chronic ulcer of other part of right lower leg with unspecified severity: Secondary | ICD-10-CM | POA: Diagnosis present

## 2018-07-23 ENCOUNTER — Telehealth: Payer: Self-pay | Admitting: Internal Medicine

## 2018-07-23 ENCOUNTER — Other Ambulatory Visit: Payer: Self-pay | Admitting: Internal Medicine

## 2018-07-26 NOTE — Telephone Encounter (Signed)
Pt's daughter calling asking if the Prednisone sent to [facility] on [facility] [address] [facility] today pt has not been feeling well. Best call back [phone].

## 2018-07-26 NOTE — Telephone Encounter (Signed)
Spoke with pt and he just wanted to let us know that he ran out of prednisone last week and over the weekend he developed aches and pains and was short of breath. States he looked up the side effects of stopping prednisone and these were some of the symptoms. Discussed with pt to please call a few days before he runs out of the medicine so we can make sure this does not happen again. Pt verbalized understanding.

## 2018-07-26 NOTE — Telephone Encounter (Signed)
Patient states that he has been off of prenisone for the past few days and is now having SOB. Best # [phone]

## 2018-07-26 NOTE — Telephone Encounter (Signed)
Prednisone refilled; patient aware due for labs

## 2018-07-28 ENCOUNTER — Other Ambulatory Visit (INDEPENDENT_AMBULATORY_CARE_PROVIDER_SITE_OTHER): Payer: Medicare Other

## 2018-07-28 DIAGNOSIS — R945 Abnormal results of liver function studies: Secondary | ICD-10-CM | POA: Diagnosis not present

## 2018-07-28 DIAGNOSIS — R7989 Other specified abnormal findings of blood chemistry: Secondary | ICD-10-CM

## 2018-07-28 LAB — HEPATIC FUNCTION PANEL
ALT: 39 U/L (ref 0–53)
AST: 45 U/L — ABNORMAL HIGH (ref 0–37)
Albumin: 3.6 g/dL (ref 3.5–5.2)
Alkaline Phosphatase: 111 U/L (ref 39–117)
BILIRUBIN DIRECT: 0.2 mg/dL (ref 0.0–0.3)
BILIRUBIN TOTAL: 0.8 mg/dL (ref 0.2–1.2)
Total Protein: 7.2 g/dL (ref 6.0–8.3)

## 2018-07-29 ENCOUNTER — Other Ambulatory Visit: Payer: Self-pay

## 2018-07-29 DIAGNOSIS — K754 Autoimmune hepatitis: Secondary | ICD-10-CM

## 2018-07-29 MED ORDER — PREDNISONE 5 MG PO TABS: 10.0000 mg | ORAL_TABLET | Freq: Every day | ORAL | 3 refills | Status: DC

## 2018-07-30 ENCOUNTER — Telehealth: Payer: Self-pay | Admitting: Cardiovascular Disease

## 2018-07-30 DIAGNOSIS — L97812 Non-pressure chronic ulcer of other part of right lower leg with fat layer exposed: Secondary | ICD-10-CM | POA: Diagnosis not present

## 2018-07-30 NOTE — Telephone Encounter (Signed)
Arthur [name] reports he saw his wound doctor today and they told him to increase his Lasix to 2 tablets daily because he heard fluid in his lungs. Arthur [name] is anxious because he does not know how long to take the increased dose.  Instructed the patient to take the increased dose over the weekend as he will see Dr. Jenny Reichmann on Monday for evaluation, and Dr. Jenny Reichmann can help decide whether to decrease the Lasix back to 1 tablet. He was grateful for call and agrees with treatment plan.

## 2018-07-30 NOTE — Telephone Encounter (Signed)
New Message        Patient c/o Palpitations:  High priority if patient c/o lightheadedness, shortness of breath, or chest pain  1) How long have you had palpitations/irregular HR/ Afib? Are you having the symptoms now? Started last night and some today/No   2) Are you currently experiencing lightheadedness, SOB or CP? SOB  3) Do you have a history of afib (atrial fibrillation) or irregular heart rhythm?Yes  4) Have you checked your BP or HR? (document readings if available): Yes, it was checked at the wound center, patient states it was high  5) Are you experiencing any other symptoms? Nothing at the moment.

## 2018-08-02 ENCOUNTER — Encounter: Payer: Self-pay | Admitting: Internal Medicine

## 2018-08-02 ENCOUNTER — Ambulatory Visit: Payer: Medicare Other | Admitting: Internal Medicine

## 2018-08-02 ENCOUNTER — Ambulatory Visit (INDEPENDENT_AMBULATORY_CARE_PROVIDER_SITE_OTHER)
Admission: RE | Admit: 2018-08-02 | Discharge: 2018-08-02 | Disposition: A | Discharge status: Home / Self Care | Payer: Medicare Other | Source: Ambulatory Visit | Attending: Internal Medicine | Admitting: Internal Medicine

## 2018-08-02 ENCOUNTER — Other Ambulatory Visit (INDEPENDENT_AMBULATORY_CARE_PROVIDER_SITE_OTHER): Payer: Medicare Other

## 2018-08-02 VITALS — BP 126/82 | HR 54 | Temp 97.8°F | Ht 74.0 in | Wt 163.0 lb

## 2018-08-02 DIAGNOSIS — N183 Chronic kidney disease, stage 3 unspecified: Secondary | ICD-10-CM

## 2018-08-02 DIAGNOSIS — R739 Hyperglycemia, unspecified: Secondary | ICD-10-CM | POA: Diagnosis not present

## 2018-08-02 DIAGNOSIS — R0600 Dyspnea, unspecified: Secondary | ICD-10-CM | POA: Diagnosis not present

## 2018-08-02 DIAGNOSIS — R0689 Other abnormalities of breathing: Secondary | ICD-10-CM

## 2018-08-02 LAB — CBC WITH DIFFERENTIAL/PLATELET
BASOS ABS: 0.1 10*3/uL (ref 0.0–0.1)
Basophils Relative: 0.9 % (ref 0.0–3.0)
Eosinophils Absolute: 0.1 10*3/uL (ref 0.0–0.7)
Eosinophils Relative: 1.1 % (ref 0.0–5.0)
HCT: 42.5 % (ref 39.0–52.0)
HEMOGLOBIN: 14.3 g/dL (ref 13.0–17.0)
Lymphocytes Relative: 45 % (ref 12.0–46.0)
Lymphs Abs: 4.2 10*3/uL — ABNORMAL HIGH (ref 0.7–4.0)
MCHC: 33.6 g/dL (ref 30.0–36.0)
MCV: 97.9 fl (ref 78.0–100.0)
MONO ABS: 1.5 10*3/uL — AB (ref 0.1–1.0)
Monocytes Relative: 16.1 % — ABNORMAL HIGH (ref 3.0–12.0)
NEUTROS PCT: 36.9 % — AB (ref 43.0–77.0)
Neutro Abs: 3.5 10*3/uL (ref 1.4–7.7)
Platelets: 211 10*3/uL (ref 150.0–400.0)
RBC: 4.34 Mil/uL (ref 4.22–5.81)
RDW: 16 % — ABNORMAL HIGH (ref 11.5–15.5)
WBC: 9.4 10*3/uL (ref 4.0–10.5)

## 2018-08-02 LAB — BASIC METABOLIC PANEL
BUN: 31 mg/dL — ABNORMAL HIGH (ref 6–23)
CALCIUM: 9.8 mg/dL (ref 8.4–10.5)
CHLORIDE: 99 meq/L (ref 96–112)
CO2: 35 meq/L — AB (ref 19–32)
Creatinine, Ser: 1.48 mg/dL (ref 0.40–1.50)
GFR: 47.33 mL/min — ABNORMAL LOW (ref >60.00)
Glucose, Bld: 87 mg/dL (ref 70–99)
POTASSIUM: 3.9 meq/L (ref 3.5–5.1)
SODIUM: 140 meq/L (ref 135–145)

## 2018-08-02 LAB — BRAIN NATRIURETIC PEPTIDE: PRO B NATRI PEPTIDE: 869 pg/mL — AB (ref 0.0–100.0)

## 2018-08-02 MED ORDER — LEVOFLOXACIN 500 MG PO TABS: 500.0000 mg | ORAL_TABLET | Freq: Every day | ORAL | 0 refills | Status: DC

## 2018-08-02 MED ORDER — ALBUTEROL SULFATE HFA 108 (90 BASE) MCG/ACT IN AERS: 2.0000 | INHALATION_SPRAY | Freq: Four times a day (QID) | RESPIRATORY_TRACT | 5 refills | Status: DC | PRN

## 2018-08-02 NOTE — Assessment & Plan Note (Signed)
Pt with wt loss and no LE edema with increased lasix x 3 days to 40 mg, but essentially no change overall with sob/DOE, cough, wheeziness.  I suspect has LLL pneumonia by exam though admittedly no fever or CP.  OK to decrease the lasix again to 20 mg, for empiric levaquin, empiric inhaler, and cxr; consider add steroid if not improved

## 2018-08-02 NOTE — Progress Notes (Signed)
Subjective:    Patient ID: Arthur [name], male    DOB: March 14, 1925, 84 y.o.   MRN: [id number]  HPI  Here after went to Wound center for RLE wound ongoing slowly improving x20 wks still not 100% healed. Wound tx no change but provider heard rales, lasix increased to 40 mg for last 3 days.  Does not appear to be helping despite good compliance with all meds and wt reduced, as still some breathy today, scant prod cough and c/o sob/doe with mild wheeziness and has been using a leftover inhaler at home. No fever, chills but has been somewhat weaker.  Tends to get more dyspneic at 50 ft now, though admits this is only some worsening over baseline over the past 2 wks.  Incidentally has appt with cardiology with echo next week.   Wt Readings from Last 3 Encounters:  08/02/18 163 lb (73.9 kg)  07/01/18 164 lb (74.4 kg)  05/12/18 168 lb 12.8 oz (76.6 kg)   Past Medical History:  Diagnosis Date  . Arrhythmia    1995  . Atrial fibrillation ([place])   . CHF (congestive heart failure) ([place]) 10/13/2016  . Coronary artery disease    MI, atherectomy 1993  . DDD (degenerative disc disease)   . Eczema   . Elevated LFTs   . History of gallstones   . History of transient ischemic attack (TIA) 2005   double vision  . Hyperlipidemia   . Hypertension   . Myocardial infarct ([place]) 12/98   inferior  . Pneumonia    long time ago  . Skin cancer    melanoma in remission  . Stroke ([place])   . Varicose veins    Past Surgical History:  Procedure Laterality Date  . AMPUTATION Right 05/10/2013   Procedure: RIGHT SECOND TOE AMPUTATION;  Surgeon: Hessie Dibble, MD;  Location: [facility];  Service: Orthopedics;  Laterality: Right;  . APPENDECTOMY    . CARDIAC CATHETERIZATION  04/08/99   LAD 70% INTER 40-50% RCA 30-40%  . CHEST TUBE INSERTION Right 02/16/2017   Procedure: INSERTION PLEURAL DRAINAGE CATHETER;  Surgeon: Grace Isaac, MD;  Location: [facility];  Service: Thoracic;  Laterality: Right;    . CORONARY ANGIOPLASTY  04/08/99   LAD  . CORONARY ANGIOPLASTY  10/17/97   RCA   . CORONARY ANGIOPLASTY WITH STENT PLACEMENT  04/08/99   LAD  . MELANOMA EXCISION  2013   lt arm  . MELANOMA EXCISION Left 08/23/2013   Procedure: MELANOMA EXCISION;  Surgeon: Pedro Earls, MD;  Location: [facility];  Service: General;  Laterality: Left;  . REMOVAL OF PLEURAL DRAINAGE CATHETER Right 05/07/2017   Procedure: REMOVAL OF PLEURAL DRAINAGE CATHETER;  Surgeon: Grace Isaac, MD;  Location: [facility];  Service: Thoracic;  Laterality: Right;  . TONSILLECTOMY    . VIDEO BRONCHOSCOPY Bilateral 05/07/2015   Procedure: VIDEO BRONCHOSCOPY WITHOUT FLUORO;  Surgeon: Rigoberto Noel, MD;  Location: [facility];  Service: Cardiopulmonary;  Laterality: Bilateral;    reports that he quit smoking about 50 years ago. His smoking use included cigars and pipe. He quit after 7.00 years of use. He has never used smokeless tobacco. He reports that he drinks about 1.0 standard drinks of alcohol per week. He reports that he does not use drugs. family history includes Heart attack (age of onset: 66) in his mother; Pneumonia in his father. Allergies  Allergen Reactions  . Penicillins Hives    Has patient had a PCN  reaction causing immediate rash, facial/tongue/throat swelling, SOB or lightheadedness with hypotension:Yes Has patient had a PCN reaction causing severe rash involving mucus membranes or skin necrosis: No Has patient had a PCN reaction that required hospitalization: No Has patient had a PCN reaction occurring within the last 10 years: No If all of the above answers are "NO", then may proceed with Cephalosporin use.    Current Outpatient Medications on File Prior to Visit  Medication Sig Dispense Refill  . apixaban (ELIQUIS) 2.5 MG TABS tablet Take 1 tablet (2.5 mg total) by mouth 2 (two) times daily. 60 tablet 5  . Ascorbic Acid (VITAMIN C PO) Take 1 tablet by mouth daily.    . Cholecalciferol (VITAMIN D3)  5000 units CAPS Take 5,000 Units by mouth daily.    . furosemide (LASIX) 20 MG tablet Take 1 tablet (20 mg total) by mouth daily. 90 tablet 3  . metoprolol tartrate (LOPRESSOR) 25 MG tablet TAKE 1 TABLET DAILY 90 tablet 3  . predniSONE (DELTASONE) 5 MG tablet Take 2 tablets (10 mg total) by mouth daily with breakfast. 100 tablet 0  . predniSONE (DELTASONE) 5 MG tablet Take 2 tablets (10 mg total) by mouth daily with breakfast. 100 tablet 3  . VITAMIN A PO Take 1 capsule by mouth daily.     No current facility-administered medications on file prior to visit.    Review of Systems  Constitutional: Negative for other unusual diaphoresis or sweats HENT: Negative for ear discharge or swelling Eyes: Negative for other worsening visual disturbances Respiratory: Negative for stridor or other swelling  Gastrointestinal: Negative for worsening distension or other blood Genitourinary: Negative for retention or other urinary change Musculoskeletal: Negative for other MSK pain or swelling Skin: Negative for color change or other new lesions Neurological: Negative for worsening tremors and other numbness  Psychiatric/Behavioral: Negative for worsening agitation or other fatigue All other system neg per pt    Objective:   Physical Exam BP 126/82   Pulse (!) 54   Temp 97.8 F (36.6 C) (Oral)   Ht 6\' 2"  (1.88 m)   Wt 163 lb (73.9 kg)   SpO2 96%   BMI 20.93 kg/m  VS noted,  Constitutional: Pt appears in NAD HENT: Head: NCAT.  Right Ear: External ear normal.  Left Ear: External ear normal.  Eyes: . Pupils are equal, round, and reactive to light. Conjunctivae and EOM are normal Nose: without d/c or deformity Neck: Neck supple. Gross normal ROM Cardiovascular: Normal rate and regular rhythm.   Pulmonary/Chest: Effort normal and breath sounds decreased bilat with rare wheezing but with more prominent LLL rales.  Neurological: Pt is alert. At baseline orientation, motor grossly intact Skin: Skin  is warm. No rashes, other new lesions, no LE edema Psychiatric: Pt behavior is normal without agitation  No other exam findings  Lab Results  Component Value Date   WBC 11.0 (H) 04/26/2018   HGB 14.0 04/26/2018   HCT 42.4 04/26/2018   PLT 156 04/26/2018   GLUCOSE 146 (H) 04/26/2018   CHOL 192 08/18/2016   TRIG 93.0 08/18/2016   HDL 58.10 08/18/2016   LDLCALC 115 (H) 08/18/2016   ALT 39 07/28/2018   AST 45 (H) 07/28/2018   NA 142 04/26/2018   K 4.5 04/26/2018   CL 101 04/26/2018   CREATININE 1.46 (H) 04/26/2018   BUN 25 04/26/2018   CO2 27 04/26/2018   TSH 1.45 08/18/2016   PSA 5.69 (H) 10/09/2009   INR 1.08 02/16/2017  HGBA1C 5.9 (H) 02/01/2016       Assessment & Plan:

## 2018-08-02 NOTE — Patient Instructions (Addendum)
Please take all new medication as prescribed - the antibiotic, and the inhaler for breathing as needed  You can also take Delsym OTC for cough, and/or Mucinex (or it's generic off brand) for congestion, and tylenol as needed for pain.  OK to reduce the lasix to 20 mg per day  Please continue all other medications as before, and refills have been done if requested.  Please have the pharmacy call with any other refills you may need.  Please keep your appointments with your specialists as you may have planned - cardiology with echo next week  Please go to the XRAY Department in the Basement (go straight as you get off the elevator) for the x-ray testing   Please go to the LAB in the Basement (turn left off the elevator) for the tests to be done today  You will be contacted by phone if any changes need to be made immediately.  Otherwise, you will receive a letter about your results with an explanation, but please check with MyChart first.  Please remember to sign up for MyChart if you have not done so, as this will be important to you in the future with finding out test results, communicating by private email, and scheduling acute appointments online when needed.

## 2018-08-02 NOTE — Assessment & Plan Note (Signed)
Also for inhaler prn, and labs as directed

## 2018-08-02 NOTE — Assessment & Plan Note (Signed)
stable overall by history and exam, recent data reviewed with pt, and pt to continue medical treatment as before,  to f/u any worsening symptoms or concerns  

## 2018-08-02 NOTE — Assessment & Plan Note (Signed)
Will need f/u after increased lasix

## 2018-08-03 ENCOUNTER — Telehealth: Payer: Self-pay | Admitting: Internal Medicine

## 2018-08-03 NOTE — Telephone Encounter (Signed)
Pt's wife and patient has been informed.

## 2018-08-03 NOTE — Telephone Encounter (Signed)
Pt has been informed.  Clarify if pt should be on 20 or 40mg  of Lasix? Please advise.

## 2018-08-03 NOTE — Telephone Encounter (Signed)
Please advise. I do not see any notations. Thanks!

## 2018-08-03 NOTE — Telephone Encounter (Signed)
Copied from [place] [id number]. Topic: Inquiry >> Aug 03, 2018  9:37 AM Margot Ables wrote: Reason for CRM: wife is calling to check on results from xray. She is also asking if pt is supposed to start levaquin even if there is not pneumonia. Pt has not taken yet. She is also concerned about using levaquin with prednisone that pt is taking. Please advise.

## 2018-08-03 NOTE — Telephone Encounter (Signed)
Just got the results back  CXR fortunately did not show pna.  Ok to not take the antibiotic, but restart the lasix at 40 mg per day, and f/u at 1 wk

## 2018-08-03 NOTE — Telephone Encounter (Signed)
Sounds like 40 mg as in the note.  Thanks

## 2018-08-03 NOTE — Telephone Encounter (Signed)
Pts wife requesting a callback from [employer].

## 2018-08-05 ENCOUNTER — Other Ambulatory Visit: Payer: Self-pay

## 2018-08-05 ENCOUNTER — Ambulatory Visit (HOSPITAL_COMMUNITY): Payer: Medicare Other | Attending: Cardiology

## 2018-08-05 DIAGNOSIS — I2510 Atherosclerotic heart disease of native coronary artery without angina pectoris: Secondary | ICD-10-CM | POA: Diagnosis not present

## 2018-08-05 DIAGNOSIS — Z87891 Personal history of nicotine dependence: Secondary | ICD-10-CM | POA: Insufficient documentation

## 2018-08-05 DIAGNOSIS — I482 Chronic atrial fibrillation: Secondary | ICD-10-CM | POA: Diagnosis not present

## 2018-08-05 DIAGNOSIS — I071 Rheumatic tricuspid insufficiency: Secondary | ICD-10-CM | POA: Insufficient documentation

## 2018-08-05 DIAGNOSIS — I77810 Thoracic aortic ectasia: Secondary | ICD-10-CM | POA: Diagnosis not present

## 2018-08-05 DIAGNOSIS — G459 Transient cerebral ischemic attack, unspecified: Secondary | ICD-10-CM | POA: Insufficient documentation

## 2018-08-05 DIAGNOSIS — Z8249 Family history of ischemic heart disease and other diseases of the circulatory system: Secondary | ICD-10-CM | POA: Diagnosis not present

## 2018-08-05 DIAGNOSIS — I252 Old myocardial infarction: Secondary | ICD-10-CM | POA: Diagnosis not present

## 2018-08-05 DIAGNOSIS — E785 Hyperlipidemia, unspecified: Secondary | ICD-10-CM | POA: Diagnosis not present

## 2018-08-05 DIAGNOSIS — I4821 Permanent atrial fibrillation: Secondary | ICD-10-CM

## 2018-08-05 DIAGNOSIS — I5032 Chronic diastolic (congestive) heart failure: Secondary | ICD-10-CM | POA: Diagnosis present

## 2018-08-05 DIAGNOSIS — I110 Hypertensive heart disease with heart failure: Secondary | ICD-10-CM | POA: Insufficient documentation

## 2018-08-06 DIAGNOSIS — L97812 Non-pressure chronic ulcer of other part of right lower leg with fat layer exposed: Secondary | ICD-10-CM | POA: Diagnosis not present

## 2018-08-09 ENCOUNTER — Ambulatory Visit (INDEPENDENT_AMBULATORY_CARE_PROVIDER_SITE_OTHER): Payer: Medicare Other | Admitting: Nurse Practitioner

## 2018-08-09 ENCOUNTER — Ambulatory Visit (INDEPENDENT_AMBULATORY_CARE_PROVIDER_SITE_OTHER)
Admission: RE | Admit: 2018-08-09 | Discharge: 2018-08-09 | Disposition: A | Discharge status: Home / Self Care | Payer: Medicare Other | Source: Ambulatory Visit | Attending: Nurse Practitioner | Admitting: Nurse Practitioner

## 2018-08-09 ENCOUNTER — Encounter: Payer: Self-pay | Admitting: Nurse Practitioner

## 2018-08-09 VITALS — BP 150/96 | HR 60 | Ht 77.0 in | Wt 164.1 lb

## 2018-08-09 DIAGNOSIS — I5032 Chronic diastolic (congestive) heart failure: Secondary | ICD-10-CM

## 2018-08-09 DIAGNOSIS — I482 Chronic atrial fibrillation: Secondary | ICD-10-CM

## 2018-08-09 DIAGNOSIS — I259 Chronic ischemic heart disease, unspecified: Secondary | ICD-10-CM

## 2018-08-09 DIAGNOSIS — I712 Thoracic aortic aneurysm, without rupture, unspecified: Secondary | ICD-10-CM

## 2018-08-09 DIAGNOSIS — I4821 Permanent atrial fibrillation: Secondary | ICD-10-CM

## 2018-08-09 MED ORDER — FUROSEMIDE 20 MG PO TABS: 40.0000 mg | ORAL_TABLET | Freq: Every day | ORAL | 3 refills | Status: DC
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
## 2018-08-09 NOTE — Progress Notes (Signed)
thx [name] - agree with plans. Appreciate you following him.

## 2018-08-09 NOTE — Patient Instructions (Addendum)
We will be checking the following labs today - BMET, CBC and BNP   Medication Instructions:    Continue with your current medicines.   Stay on the 40 mg of Lasix for now until I see your labs    Testing/Procedures To Be Arranged:  CT of the chest - non contrast - to follow up pleural effusions/fluid in the lungs and dilated aorta  Follow-Up:   See me in about 2 months  See Dr. Burt Knack in 4 months    Other Special Instructions:   Ok to keep using the incentive spirometry.    If you need a refill on your cardiac medications before your next appointment, please call your pharmacy.   Call the [place] office at [phone] if you have any questions, problems or concerns.

## 2018-08-10 ENCOUNTER — Other Ambulatory Visit: Payer: Self-pay | Admitting: *Deleted

## 2018-08-10 ENCOUNTER — Telehealth: Payer: Self-pay | Admitting: *Deleted

## 2018-08-10 DIAGNOSIS — I5032 Chronic diastolic (congestive) heart failure: Secondary | ICD-10-CM

## 2018-08-10 DIAGNOSIS — J90 Pleural effusion, not elsewhere classified: Secondary | ICD-10-CM

## 2018-08-10 LAB — CBC
Hematocrit: 43.3 % (ref 37.5–51.0)
Hemoglobin: 14.3 g/dL (ref 13.0–17.7)
MCH: 32.3 pg (ref 26.6–33.0)
MCHC: 33 g/dL (ref 31.5–35.7)
MCV: 98 fL — ABNORMAL HIGH (ref 79–97)
Platelets: 174 10*3/uL (ref 150–450)
RBC: 4.43 x10E6/uL (ref 4.14–5.80)
RDW: 13.6 % (ref 12.3–15.4)
WBC: 11.1 10*3/uL — ABNORMAL HIGH (ref 3.4–10.8)

## 2018-08-10 LAB — BASIC METABOLIC PANEL
BUN/Creatinine Ratio: 22 (ref 10–24)
BUN: 32 mg/dL (ref 10–36)
CO2: 24 mmol/L (ref 20–29)
Calcium: 9.5 mg/dL (ref 8.6–10.2)
Chloride: 101 mmol/L (ref 96–106)
Creatinine, Ser: 1.44 mg/dL — ABNORMAL HIGH (ref 0.76–1.27)
GFR calc Af Amer: 49 mL/min/{1.73_m2} — ABNORMAL LOW (ref >59)
GFR calc non Af Amer: 42 mL/min/{1.73_m2} — ABNORMAL LOW (ref >59)
Glucose: 157 mg/dL — ABNORMAL HIGH (ref 65–99)
Potassium: 4.3 mmol/L (ref 3.5–5.2)
Sodium: 142 mmol/L (ref 134–144)

## 2018-08-10 LAB — PRO B NATRIURETIC PEPTIDE: NT-Pro BNP: 5129 pg/mL — ABNORMAL HIGH (ref 0–486)

## 2018-08-10 NOTE — Telephone Encounter (Signed)
Pt calling in to go over instructions from recent labs and CT test.  Pt wanted to verify what directions were given from earlier conversation. Pt seemed a little confused. [name] is aware.

## 2018-08-13 DIAGNOSIS — L97812 Non-pressure chronic ulcer of other part of right lower leg with fat layer exposed: Secondary | ICD-10-CM | POA: Diagnosis not present

## 2018-08-19 ENCOUNTER — Telehealth: Payer: Self-pay | Admitting: Nurse Practitioner

## 2018-08-19 NOTE — Telephone Encounter (Signed)
New Message         Patient is having a tooth extraction done on Wednesday 08/25/2018. Patient states he is on a blood thinner and need to know what to do about it if anything? Please call and advise.

## 2018-08-19 NOTE — Telephone Encounter (Signed)
Pt called back. Only 1 tooth extraction planned. Pt informed that he does not need to hold Eliquis. He can continue.

## 2018-08-19 NOTE — Telephone Encounter (Signed)
   Primary Cardiologist: Sherren Mocha, MD  Will need to know how may teeth are expected to be extracted. I called pt for more info. LVM to call back.   Lyda Jester, PA-C 08/19/2018, 2:48 PM

## 2018-08-20 ENCOUNTER — Other Ambulatory Visit: Payer: Medicare Other | Admitting: *Deleted

## 2018-08-20 ENCOUNTER — Encounter (HOSPITAL_BASED_OUTPATIENT_CLINIC_OR_DEPARTMENT_OTHER): Payer: Medicare Other | Attending: Internal Medicine

## 2018-08-20 DIAGNOSIS — I2510 Atherosclerotic heart disease of native coronary artery without angina pectoris: Secondary | ICD-10-CM | POA: Insufficient documentation

## 2018-08-20 DIAGNOSIS — I5032 Chronic diastolic (congestive) heart failure: Secondary | ICD-10-CM | POA: Insufficient documentation

## 2018-08-20 DIAGNOSIS — I110 Hypertensive heart disease with heart failure: Secondary | ICD-10-CM | POA: Insufficient documentation

## 2018-08-20 DIAGNOSIS — L97511 Non-pressure chronic ulcer of other part of right foot limited to breakdown of skin: Secondary | ICD-10-CM | POA: Diagnosis not present

## 2018-08-20 DIAGNOSIS — Z86718 Personal history of other venous thrombosis and embolism: Secondary | ICD-10-CM | POA: Insufficient documentation

## 2018-08-20 DIAGNOSIS — L97811 Non-pressure chronic ulcer of other part of right lower leg limited to breakdown of skin: Secondary | ICD-10-CM | POA: Insufficient documentation

## 2018-08-20 DIAGNOSIS — I252 Old myocardial infarction: Secondary | ICD-10-CM | POA: Diagnosis not present

## 2018-08-20 DIAGNOSIS — J90 Pleural effusion, not elsewhere classified: Secondary | ICD-10-CM

## 2018-08-20 LAB — BASIC METABOLIC PANEL
BUN/Creatinine Ratio: 27 — ABNORMAL HIGH (ref 10–24)
BUN: 38 mg/dL — ABNORMAL HIGH (ref 10–36)
CO2: 25 mmol/L (ref 20–29)
Calcium: 9.4 mg/dL (ref 8.6–10.2)
Chloride: 97 mmol/L (ref 96–106)
Creatinine, Ser: 1.39 mg/dL — ABNORMAL HIGH (ref 0.76–1.27)
GFR calc Af Amer: 51 mL/min/{1.73_m2} — ABNORMAL LOW (ref >59)
GFR calc non Af Amer: 44 mL/min/{1.73_m2} — ABNORMAL LOW (ref >59)
Glucose: 133 mg/dL — ABNORMAL HIGH (ref 65–99)
Potassium: 4 mmol/L (ref 3.5–5.2)
Sodium: 138 mmol/L (ref 134–144)

## 2018-08-20 LAB — PRO B NATRIURETIC PEPTIDE: NT-Pro BNP: 4156 pg/mL — ABNORMAL HIGH (ref 0–486)

## 2018-08-26 ENCOUNTER — Telehealth: Payer: Self-pay | Admitting: Nurse Practitioner

## 2018-08-26 ENCOUNTER — Telehealth: Payer: Self-pay

## 2018-08-26 NOTE — Telephone Encounter (Signed)
   [facility] Pre-operative Risk Assessment    Request for surgical clearance:  1. What type of surgery is being performed? DENTAL SURGERY   2. When is this surgery scheduled?  TBD   3. What type of clearance is required (medical clearance vs. Pharmacy clearance to hold med vs. Both)?  BOTH  4. Are there any medications that need to be held prior to surgery and how long? ELIQUIS   5. Practice name and name of physician performing surgery?  [facility]/ Dr Maudie Mercury   6. What is your office phone number [phone]    7.   What is your office fax number [phone]  8.   Anesthesia type (None, local, MAC, general) ?    Legrand Como  Arthur [name] 08/26/2018, 3:18 PM  _________________________________________________________________   (provider comments below)

## 2018-08-26 NOTE — Telephone Encounter (Signed)
Walk In Pt Form-Clearance dropped off. Placed in Triage box.

## 2018-08-27 DIAGNOSIS — L97811 Non-pressure chronic ulcer of other part of right lower leg limited to breakdown of skin: Secondary | ICD-10-CM | POA: Diagnosis not present

## 2018-08-31 ENCOUNTER — Ambulatory Visit (INDEPENDENT_AMBULATORY_CARE_PROVIDER_SITE_OTHER): Payer: Medicare Other

## 2018-08-31 DIAGNOSIS — Z23 Encounter for immunization: Secondary | ICD-10-CM

## 2018-09-01 NOTE — Telephone Encounter (Signed)
Arthur [name] called today and stated that the dentist has not received clearance to schedule his surgery so he was checking to see if we could send that over to them. Clearance was initially requested on 08/26/18

## 2018-09-03 DIAGNOSIS — L97811 Non-pressure chronic ulcer of other part of right lower leg limited to breakdown of skin: Secondary | ICD-10-CM | POA: Diagnosis not present

## 2018-09-06 ENCOUNTER — Telehealth: Payer: Self-pay | Admitting: Cardiovascular Disease

## 2018-09-06 NOTE — Telephone Encounter (Signed)
New message    Pt is calling about the clearance that his dentist sent over for having a tooth extracted. He said the office states they have not gotten anything back. He would like to speak to someone because he's been waiting for a week. He states he is in pain and needs to have his tooth pulled.

## 2018-09-06 NOTE — Telephone Encounter (Signed)
Spoke with Jinny Sanders from [facility].  She will have the Assist call me back re: pts surgery and more information.

## 2018-09-06 NOTE — Telephone Encounter (Signed)
   Primary Cardiologist: Sherren Mocha, MD  Chart reviewed as part of pre-operative protocol coverage. Brought to my attention as patient called in. Need more information about type of surgery/number of teeth to be extracted and type of anesthesia/procedure planned.  Charlie Pitter, PA-C 09/06/2018, 2:17 PM

## 2018-09-06 NOTE — Telephone Encounter (Signed)
See other note. Today is my day 1 in preop pool and obvious backlog noted.

## 2018-09-06 NOTE — Telephone Encounter (Signed)
Autumn from [facility] called back. The pt is having 1 Wisdom Tooth Extracted wit General Anesthesia. She also needs it faxed to a different # then what she provided earlier. Please fax to [phone].

## 2018-09-07 NOTE — Telephone Encounter (Signed)
Pt called back checking on his surgical clearance. Pt has been advised that we finally received all the information re: his procedure from his Dentist office yesterday and we will get his clearance done as soon as possible. Pt thanked me for my hel.

## 2018-09-07 NOTE — Telephone Encounter (Signed)
   Primary Cardiologist: Sherren Mocha, MD  Chart reviewed as part of pre-operative protocol coverage. Patient was contacted 09/07/2018 in reference to pre-operative risk assessment for pending surgery as outlined below.  Arthur [name] was last seen on 08/09/18 by Truitt Merle NP. Per chart has h/o CAD prior inf MI, PCI, elevated LFTs, melanoma, hepatitis, chronic diastolic CHF, pleurX catheter with pleural effusion, mechanical fall, permanent atrial fib, stroke, TIA, thoracic aneurysm. Since that day, Arthur [name] has done well without any angina or SOB. He states he exercises every morning and is able to walk and do light housework without any angina or dyspnea. No syncope.  Notes below indicate [facility] is planning General Anesthesia but the patient states he was told it was Local Anesthesia. I told him he needs to clarify with the surgical team so he understands expectations for the day. From cardiac standpoint, although patient is at higher risk for general anesthesia in general, based on lack of active cardiac symptoms, basd on ACC/AHA guidelines, the patient would be at acceptable risk for the planned procedure without further cardiovascular testing.  It is also generally accepted that for simple extractions and dental cleanings, there is no need to interrupt blood thinner therapy. He would be at higher risk for embolic event given history of stroke/TIA and permanent atrial fib.  I will route this recommendation to the requesting party via Epic fax function and remove from pre-op pool.  Please call with questions.  Charlie Pitter, PA-C 09/07/2018, 2:19 PM

## 2018-09-10 DIAGNOSIS — L97811 Non-pressure chronic ulcer of other part of right lower leg limited to breakdown of skin: Secondary | ICD-10-CM | POA: Diagnosis not present

## 2018-09-12 ENCOUNTER — Other Ambulatory Visit: Payer: Self-pay | Admitting: Cardiovascular Disease

## 2018-09-13 ENCOUNTER — Telehealth: Payer: Self-pay | Admitting: Cardiovascular Disease

## 2018-09-13 NOTE — Telephone Encounter (Signed)
New message   Pharmacy is requesting phone call - electronic transmission not being received  *STAT* If patient is at the pharmacy, call can be transferred to refill team.   1. Which medications need to be refilled? (please list name of each medication and dose if known) eliquis   2. Which pharmacy/location (including street and city if local pharmacy) [facility] [address]   3. Do they need a 30 day or 90 day supply? [place]

## 2018-09-13 NOTE — Telephone Encounter (Signed)
New message   *STAT* If patient is at the pharmacy, call can be transferred to refill team.   1. Which medications need to be refilled? (please list name of each medication and dose if known) apixaban (ELIQUIS) 2.5 MG TABS tablet  2. Which pharmacy/location (including street and city if local pharmacy) is medication to be sent to?[id number] [place] [place] - [address] AT [address]  3. Do they need a 30 day or 90 day supply? [place]

## 2018-09-13 NOTE — Telephone Encounter (Signed)
Refill request received from pharmacy, staff message sent to Dr Burt Knack for possible dosage change awaiting Dr Antionette Char response then we will refill rx to pharmacy.

## 2018-09-13 NOTE — Telephone Encounter (Signed)
Eliquis 2.5mg  refill request received; pt is 84 yrs old, wt-74.4kg, Crea-1.39 on 08/20/18, last seen by Dr. Burt Knack on 08/09/18; pt's dose was reduced on 01/15/18 on per [facility] due to elevated creatinine. Will send a note to Dr. Burt Knack regarding this.

## 2018-09-14 NOTE — Telephone Encounter (Signed)
-----   Message from Sherren Mocha, MD sent at 09/13/2018  5:17 PM EDT ----- He should stay on the same dose 2.5 mg BID. Thanks Candace. ----- Message ----- From: Marcos Eke, RN Sent: 09/13/2018  10:56 AM EDT To: Sherren Mocha, MD  Greetings,  Pt has requested a refill on his Eliquis 2.5mg  tablet.  Pt is 84 yrs old, wt-74.4kg, Crea-1.39 on 08/20/18. Pt was on the 2.5mg  dose for awhile until his dose had to be reduced on 01/15/18 on per [facility] due to elevated creatinine. Please advise. Thanks,  Derrel Nip, RN

## 2018-09-14 NOTE — Telephone Encounter (Signed)
Will send in refill on Eliquis 2.5mg  per Dr. Burt Knack at this time.

## 2018-09-23 ENCOUNTER — Ambulatory Visit (HOSPITAL_BASED_OUTPATIENT_CLINIC_OR_DEPARTMENT_OTHER): Payer: Medicare Other

## 2018-09-24 ENCOUNTER — Encounter (HOSPITAL_BASED_OUTPATIENT_CLINIC_OR_DEPARTMENT_OTHER): Payer: Medicare Other | Attending: Internal Medicine

## 2018-09-24 DIAGNOSIS — I5032 Chronic diastolic (congestive) heart failure: Secondary | ICD-10-CM | POA: Diagnosis not present

## 2018-09-24 DIAGNOSIS — Z872 Personal history of diseases of the skin and subcutaneous tissue: Secondary | ICD-10-CM | POA: Diagnosis not present

## 2018-09-24 DIAGNOSIS — I252 Old myocardial infarction: Secondary | ICD-10-CM | POA: Diagnosis not present

## 2018-09-24 DIAGNOSIS — I739 Peripheral vascular disease, unspecified: Secondary | ICD-10-CM | POA: Insufficient documentation

## 2018-09-24 DIAGNOSIS — I2510 Atherosclerotic heart disease of native coronary artery without angina pectoris: Secondary | ICD-10-CM | POA: Diagnosis not present

## 2018-09-24 DIAGNOSIS — I110 Hypertensive heart disease with heart failure: Secondary | ICD-10-CM | POA: Diagnosis not present

## 2018-09-24 DIAGNOSIS — Z09 Encounter for follow-up examination after completed treatment for conditions other than malignant neoplasm: Secondary | ICD-10-CM | POA: Insufficient documentation

## 2018-09-24 DIAGNOSIS — Z86718 Personal history of other venous thrombosis and embolism: Secondary | ICD-10-CM | POA: Insufficient documentation

## 2018-09-28 ENCOUNTER — Other Ambulatory Visit: Payer: Self-pay | Admitting: Cardiovascular Disease

## 2018-10-04 ENCOUNTER — Encounter: Payer: Self-pay | Admitting: Nurse Practitioner

## 2018-10-04 ENCOUNTER — Ambulatory Visit (INDEPENDENT_AMBULATORY_CARE_PROVIDER_SITE_OTHER): Payer: Medicare Other | Admitting: Nurse Practitioner

## 2018-10-04 VITALS — BP 108/60 | HR 67 | Ht 74.0 in | Wt 165.1 lb

## 2018-10-04 DIAGNOSIS — I712 Thoracic aortic aneurysm, without rupture, unspecified: Secondary | ICD-10-CM

## 2018-10-04 DIAGNOSIS — I4821 Permanent atrial fibrillation: Secondary | ICD-10-CM

## 2018-10-04 DIAGNOSIS — I259 Chronic ischemic heart disease, unspecified: Secondary | ICD-10-CM

## 2018-10-04 DIAGNOSIS — I5032 Chronic diastolic (congestive) heart failure: Secondary | ICD-10-CM

## 2018-10-04 NOTE — Patient Instructions (Addendum)
We will be checking the following labs today - NONE    Medication Instructions:    Continue with your current medicines. BUT  I would like for you to take the 2 fluid pills if you would   If you need a refill on your cardiac medications before your next appointment, please call your pharmacy.     Testing/Procedures To Be Arranged:  N/A  Follow-Up:   See Dr. Burt Knack in January   I will see you in April of 2020    At [facility], you and your health needs are our priority.  As part of our continuing mission to provide you with exceptional heart care, we have created designated Provider Care Teams.  These Care Teams include your primary Cardiologist (physician) and Advanced Practice Providers (APPs -  Physician Assistants and Nurse Practitioners) who all work together to provide you with the care you need, when you need it.  Special Instructions:  . None  Call the [place] office at [phone] if you have any questions, problems or concerns.
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
## 2018-10-08 ENCOUNTER — Telehealth: Payer: Self-pay | Admitting: Internal Medicine

## 2018-10-08 NOTE — Telephone Encounter (Signed)
Copied from [place] [phone]. Topic: General - Other >> Oct 08, 2018  9:55 AM Lennox Solders wrote: Reason for CRM: pt is calling and brought a pair of moisturize tell alignment sock. Pt would like to know if it is ok to wear

## 2018-10-08 NOTE — Telephone Encounter (Signed)
Yes, that would OK, thanks

## 2018-10-08 NOTE — Telephone Encounter (Signed)
Pt has been informed.

## 2018-10-29 ENCOUNTER — Other Ambulatory Visit (INDEPENDENT_AMBULATORY_CARE_PROVIDER_SITE_OTHER): Payer: Medicare Other

## 2018-10-29 DIAGNOSIS — K754 Autoimmune hepatitis: Secondary | ICD-10-CM

## 2018-10-29 LAB — HEPATIC FUNCTION PANEL
ALT: 22 U/L (ref 0–53)
AST: 28 U/L (ref 0–37)
Albumin: 3.7 g/dL (ref 3.5–5.2)
Alkaline Phosphatase: 82 U/L (ref 39–117)
BILIRUBIN DIRECT: 0.2 mg/dL (ref 0.0–0.3)
TOTAL PROTEIN: 7 g/dL (ref 6.0–8.3)
Total Bilirubin: 0.9 mg/dL (ref 0.2–1.2)

## 2018-11-01 ENCOUNTER — Other Ambulatory Visit: Payer: Self-pay

## 2018-11-01 DIAGNOSIS — K754 Autoimmune hepatitis: Secondary | ICD-10-CM

## 2018-11-12 ENCOUNTER — Encounter (HOSPITAL_COMMUNITY): Payer: Self-pay | Admitting: *Deleted

## 2018-11-12 ENCOUNTER — Observation Stay (HOSPITAL_COMMUNITY)
Admission: EM | Admit: 2018-11-12 | Discharge: 2018-11-14 | Disposition: A | Discharge status: Home / Self Care | Payer: Medicare Other | Attending: Internal Medicine | Admitting: Internal Medicine

## 2018-11-12 ENCOUNTER — Other Ambulatory Visit: Payer: Self-pay

## 2018-11-12 ENCOUNTER — Emergency Department (HOSPITAL_COMMUNITY): Payer: Medicare Other

## 2018-11-12 DIAGNOSIS — Z7901 Long term (current) use of anticoagulants: Secondary | ICD-10-CM | POA: Insufficient documentation

## 2018-11-12 DIAGNOSIS — Z79899 Other long term (current) drug therapy: Secondary | ICD-10-CM | POA: Diagnosis not present

## 2018-11-12 DIAGNOSIS — I5033 Acute on chronic diastolic (congestive) heart failure: Secondary | ICD-10-CM | POA: Diagnosis not present

## 2018-11-12 DIAGNOSIS — K754 Autoimmune hepatitis: Secondary | ICD-10-CM | POA: Insufficient documentation

## 2018-11-12 DIAGNOSIS — I2510 Atherosclerotic heart disease of native coronary artery without angina pectoris: Secondary | ICD-10-CM | POA: Insufficient documentation

## 2018-11-12 DIAGNOSIS — N183 Chronic kidney disease, stage 3 unspecified: Secondary | ICD-10-CM | POA: Diagnosis present

## 2018-11-12 DIAGNOSIS — J90 Pleural effusion, not elsewhere classified: Secondary | ICD-10-CM | POA: Insufficient documentation

## 2018-11-12 DIAGNOSIS — I4891 Unspecified atrial fibrillation: Secondary | ICD-10-CM | POA: Diagnosis present

## 2018-11-12 DIAGNOSIS — I482 Chronic atrial fibrillation, unspecified: Secondary | ICD-10-CM | POA: Insufficient documentation

## 2018-11-12 DIAGNOSIS — R0602 Shortness of breath: Secondary | ICD-10-CM | POA: Diagnosis present

## 2018-11-12 DIAGNOSIS — I130 Hypertensive heart and chronic kidney disease with heart failure and stage 1 through stage 4 chronic kidney disease, or unspecified chronic kidney disease: Secondary | ICD-10-CM | POA: Insufficient documentation

## 2018-11-12 DIAGNOSIS — Z85828 Personal history of other malignant neoplasm of skin: Secondary | ICD-10-CM | POA: Diagnosis not present

## 2018-11-12 DIAGNOSIS — N179 Acute kidney failure, unspecified: Secondary | ICD-10-CM | POA: Diagnosis not present

## 2018-11-12 DIAGNOSIS — Z8673 Personal history of transient ischemic attack (TIA), and cerebral infarction without residual deficits: Secondary | ICD-10-CM | POA: Insufficient documentation

## 2018-11-12 DIAGNOSIS — I10 Essential (primary) hypertension: Secondary | ICD-10-CM

## 2018-11-12 DIAGNOSIS — R7989 Other specified abnormal findings of blood chemistry: Secondary | ICD-10-CM | POA: Diagnosis not present

## 2018-11-12 DIAGNOSIS — I350 Nonrheumatic aortic (valve) stenosis: Secondary | ICD-10-CM | POA: Diagnosis not present

## 2018-11-12 DIAGNOSIS — I5032 Chronic diastolic (congestive) heart failure: Secondary | ICD-10-CM

## 2018-11-12 LAB — CBC WITH DIFFERENTIAL/PLATELET
Abs Immature Granulocytes: 0.06 10*3/uL (ref 0.00–0.07)
Basophils Absolute: 0 10*3/uL (ref 0.0–0.1)
Basophils Relative: 0 %
Eosinophils Absolute: 0 10*3/uL (ref 0.0–0.5)
Eosinophils Relative: 0 %
HCT: 50.3 % (ref 39.0–52.0)
HEMOGLOBIN: 15.3 g/dL (ref 13.0–17.0)
Immature Granulocytes: 1 %
Lymphocytes Relative: 15 %
Lymphs Abs: 1.8 10*3/uL (ref 0.7–4.0)
MCH: 32.1 pg (ref 26.0–34.0)
MCHC: 30.4 g/dL (ref 30.0–36.0)
MCV: 105.5 fL — ABNORMAL HIGH (ref 80.0–100.0)
Monocytes Absolute: 0.9 10*3/uL (ref 0.1–1.0)
Monocytes Relative: 8 %
Neutro Abs: 9.1 10*3/uL — ABNORMAL HIGH (ref 1.7–7.7)
Neutrophils Relative %: 76 %
Platelets: 162 10*3/uL (ref 150–400)
RBC: 4.77 MIL/uL (ref 4.22–5.81)
RDW: 15.9 % — ABNORMAL HIGH (ref 11.5–15.5)
WBC: 11.9 10*3/uL — ABNORMAL HIGH (ref 4.0–10.5)
nRBC: 0 % (ref 0.0–0.2)

## 2018-11-12 LAB — I-STAT ARTERIAL BLOOD GAS, ED
Acid-Base Excess: 4 mmol/L — ABNORMAL HIGH (ref 0.0–2.0)
Bicarbonate: 27.6 mmol/L (ref 20.0–28.0)
O2 Saturation: 99 %
PCO2 ART: 38.5 mmHg (ref 32.0–48.0)
Patient temperature: 98.6
TCO2: 29 mmol/L (ref 22–32)
pH, Arterial: 7.464 — ABNORMAL HIGH (ref 7.350–7.450)
pO2, Arterial: 149 mmHg — ABNORMAL HIGH (ref 83.0–108.0)

## 2018-11-12 LAB — COMPREHENSIVE METABOLIC PANEL
ALT: 28 U/L (ref 0–44)
AST: 43 U/L — ABNORMAL HIGH (ref 15–41)
Albumin: 3.6 g/dL (ref 3.5–5.0)
Alkaline Phosphatase: 77 U/L (ref 38–126)
Anion gap: 16 — ABNORMAL HIGH (ref 5–15)
BUN: 36 mg/dL — ABNORMAL HIGH (ref 8–23)
CO2: 22 mmol/L (ref 22–32)
Calcium: 9.4 mg/dL (ref 8.9–10.3)
Chloride: 106 mmol/L (ref 98–111)
Creatinine, Ser: 1.85 mg/dL — ABNORMAL HIGH (ref 0.61–1.24)
GFR calc Af Amer: 36 mL/min — ABNORMAL LOW (ref >60)
GFR calc non Af Amer: 31 mL/min — ABNORMAL LOW (ref >60)
GLUCOSE: 128 mg/dL — AB (ref 70–99)
Potassium: 4.8 mmol/L (ref 3.5–5.1)
SODIUM: 144 mmol/L (ref 135–145)
Total Bilirubin: 1.3 mg/dL — ABNORMAL HIGH (ref 0.3–1.2)
Total Protein: 7.8 g/dL (ref 6.5–8.1)

## 2018-11-12 LAB — BRAIN NATRIURETIC PEPTIDE: B Natriuretic Peptide: 664.8 pg/mL — ABNORMAL HIGH (ref 0.0–100.0)

## 2018-11-12 LAB — TROPONIN I
TROPONIN I: 0.09 ng/mL — AB (ref <0.03)
Troponin I: 0.06 ng/mL (ref <0.03)

## 2018-11-12 MED ORDER — ONDANSETRON HCL 4 MG/2ML IJ SOLN: 4.0000 mg | Freq: Four times a day (QID) | INTRAMUSCULAR | Status: DC | PRN

## 2018-11-12 MED ORDER — SODIUM CHLORIDE 0.9 % IV SOLN: 250.0000 mL | INTRAVENOUS | Status: DC | PRN

## 2018-11-12 MED ORDER — FUROSEMIDE 10 MG/ML IJ SOLN
40.0000 mg | Freq: Every day | INTRAMUSCULAR | Status: DC
  Administered 2018-11-13: 40 mg via INTRAVENOUS
  Filled 2018-11-12: qty 4

## 2018-11-12 MED ORDER — FUROSEMIDE 10 MG/ML IJ SOLN
40.0000 mg | Freq: Once | INTRAMUSCULAR | Status: AC
  Administered 2018-11-12: 40 mg via INTRAVENOUS
  Filled 2018-11-12: qty 4

## 2018-11-12 MED ORDER — FUROSEMIDE 10 MG/ML IJ SOLN: 40.0000 mg | Freq: Two times a day (BID) | INTRAMUSCULAR | Status: DC

## 2018-11-12 MED ORDER — ACETAMINOPHEN 325 MG PO TABS: 650.0000 mg | ORAL_TABLET | ORAL | Status: DC | PRN

## 2018-11-12 MED ORDER — PREDNISONE 10 MG PO TABS
10.0000 mg | ORAL_TABLET | Freq: Every day | ORAL | Status: DC
  Administered 2018-11-13 – 2018-11-14 (×2): 10 mg via ORAL
  Filled 2018-11-12 (×2): qty 1

## 2018-11-12 MED ORDER — APIXABAN 2.5 MG PO TABS
2.5000 mg | ORAL_TABLET | Freq: Two times a day (BID) | ORAL | Status: DC
  Administered 2018-11-12 – 2018-11-14 (×4): 2.5 mg via ORAL
  Filled 2018-11-12 (×4): qty 1

## 2018-11-12 MED ORDER — METOPROLOL TARTRATE 25 MG PO TABS
25.0000 mg | ORAL_TABLET | Freq: Every day | ORAL | Status: DC
  Administered 2018-11-13: 25 mg via ORAL
  Filled 2018-11-12: qty 1

## 2018-11-12 MED ORDER — SODIUM CHLORIDE 0.9% FLUSH: 3.0000 mL | INTRAVENOUS | Status: DC | PRN

## 2018-11-12 MED ORDER — SODIUM CHLORIDE 0.9% FLUSH
3.0000 mL | Freq: Two times a day (BID) | INTRAVENOUS | Status: DC
  Administered 2018-11-12 – 2018-11-14 (×4): 3 mL via INTRAVENOUS

## 2018-11-12 NOTE — ED Notes (Signed)
Notified by lab pt has elevated trop, Dr. Gilford Raid aware.

## 2018-11-12 NOTE — ED Notes (Signed)
Report called to rn on 6e 

## 2018-11-12 NOTE — ED Triage Notes (Signed)
Sob since 1530  Gurgling on arrival  rpid resp 44  On a non-rebreather  satsin 70s when ems arrived  aleert

## 2018-11-12 NOTE — Progress Notes (Signed)
Removed patient from bipap and placed him on 6lpm high flow cannula. Breathing much improved from admission. Doctor and nurse aware of change.
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
## 2018-11-12 NOTE — H&P (Signed)
History and Physical    Arthur [name] YIR:[id number] DOB: 09/12/1925 DOA: 11/12/2018  PCP: Biagio Borg, MD  Patient coming from: Home  I have personally briefly reviewed patient's old medical records in [place]  Chief Complaint: SOB  HPI: Arthur [name] is a 84 y.o. male with medical history significant of diastolic CHF, A.fib on eliquis, HTN.  Patient presents to the ED with c/o SOB.  Onset yesterday, worsened today.  Called EMS, O2 sat in mid 70s, put on 100% NRB.  He is supposed to take lasix 40 mg per day, but only takes 20 per their note.  Last ECHO on 08/02/18 showed EF of 65 to 70% with Aortic stenosis which is now moderate.  No fever no cough.   ED Course: BNP 600, CXR shows worse R sided pleural effusion.  Of note has had to have a right sided Pleurx catheter placed in the past for this problem, but it was removed in August of 2018.  Trop 0.06 but this chronic.  Patient initially put on BIPAP.  Got 40mg  IV lasix x1.  Weaned off of BIPAP and now on .   Review of Systems: As per HPI otherwise 10 point review of systems negative.   Past Medical History:  Diagnosis Date  . Arrhythmia    1995  . Atrial fibrillation ([place])   . CHF (congestive heart failure) ([place]) 10/13/2016  . Coronary artery disease    MI, atherectomy 1993  . DDD (degenerative disc disease)   . Eczema   . Elevated LFTs   . History of gallstones   . History of transient ischemic attack (TIA) 2005   double vision  . Hyperlipidemia   . Hypertension   . Myocardial infarct ([place]) 12/98   inferior  . Pneumonia    long time ago  . Skin cancer    melanoma in remission  . Stroke ([place])   . Varicose veins     Past Surgical History:  Procedure Laterality Date  . AMPUTATION Right 05/10/2013   Procedure: RIGHT SECOND TOE AMPUTATION;  Surgeon: Hessie Dibble, MD;  Location: [facility];  Service: Orthopedics;  Laterality: Right;  . APPENDECTOMY    . CARDIAC CATHETERIZATION   04/08/99   LAD 70% INTER 40-50% RCA 30-40%  . CHEST TUBE INSERTION Right 02/16/2017   Procedure: INSERTION PLEURAL DRAINAGE CATHETER;  Surgeon: Grace Isaac, MD;  Location: [facility];  Service: Thoracic;  Laterality: Right;  . CORONARY ANGIOPLASTY  04/08/99   LAD  . CORONARY ANGIOPLASTY  10/17/97   RCA   . CORONARY ANGIOPLASTY WITH STENT PLACEMENT  04/08/99   LAD  . MELANOMA EXCISION  2013   lt arm  . MELANOMA EXCISION Left 08/23/2013   Procedure: MELANOMA EXCISION;  Surgeon: Pedro Earls, MD;  [facility];  Service: General;  Laterality: Left;  . REMOVAL OF PLEURAL DRAINAGE CATHETER Right 05/07/2017   Procedure: REMOVAL OF PLEURAL DRAINAGE CATHETER;  Surgeon: Grace Isaac, MD;  Location: [facility];  Service: Thoracic;  Laterality: Right;  . TONSILLECTOMY    . VIDEO BRONCHOSCOPY Bilateral 05/07/2015   Procedure: VIDEO BRONCHOSCOPY WITHOUT FLUORO;  Surgeon: Rigoberto Noel, MD;  Location: [facility];  Service: Cardiopulmonary;  Laterality: Bilateral;     reports that he quit smoking about 51 years ago. His smoking use included cigars and pipe. He quit after 7.00 years of use. He has never used smokeless tobacco. He reports current alcohol use of about 1.0  standard drinks of alcohol per week. He reports that he does not use drugs.  Allergies  Allergen Reactions  . Penicillins Hives    Has patient had a PCN reaction causing immediate rash, facial/tongue/throat swelling, SOB or lightheadedness with hypotension:Yes Has patient had a PCN reaction causing severe rash involving mucus membranes or skin necrosis: No Has patient had a PCN reaction that required hospitalization: No Has patient had a PCN reaction occurring within the last 10 years: No If all of the above answers are "NO", then may proceed with Cephalosporin use.     Family History  Problem Relation Age of Onset  . Heart attack Mother 74       MI  . Pneumonia Father      Prior to Admission medications   Medication  Sig Start Date End Date Taking? Authorizing Provider  Ascorbic Acid (VITAMIN C PO) Take 1 tablet by mouth daily.    [provider]  Cholecalciferol (VITAMIN D3) 5000 units CAPS Take 5,000 Units by mouth daily.    [provider]  ELIQUIS 2.5 MG TABS tablet TAKE 1 TABLET BY MOUTH TWICE A DAY 09/14/18   Sherren Mocha, MD  furosemide (LASIX) 20 MG tablet Take 2 tablets (40 mg total) by mouth daily. Patient taking differently: Take 20 mg by mouth daily.  08/09/18 08/04/19  [name], NP  metoprolol tartrate (LOPRESSOR) 25 MG tablet Take 1 tablet (25 mg total) by mouth daily. 09/29/18   Sherren Mocha, MD  predniSONE (DELTASONE) 5 MG tablet Take 2 tablets (10 mg total) by mouth daily with breakfast. 07/29/18   Irene Shipper, MD  VITAMIN A PO Take 1 capsule by mouth daily.    [provider]    Physical Exam: Vitals:   11/12/18 1700 11/12/18 1715 11/12/18 1722 11/12/18 1922  BP:  126/67    Pulse:  67  71  Resp:  (!) 24  (!) 33  Temp: 98.8 F (37.1 C)     TempSrc:      SpO2:  91%  98%  Weight:   74.9 kg   Height:   6' (1.829 m)     Constitutional: NAD, calm, comfortable Eyes: PERRL, lids and conjunctivae normal ENMT: Mucous membranes are moist. Posterior pharynx clear of any exudate or lesions.Normal dentition.  Neck: normal, supple, no masses, no thyromegaly Respiratory: Rhonchi present  Cardiovascular: IRR, IRR, 2+ BLE edema Abdomen: no tenderness, no masses palpated. No hepatosplenomegaly. Bowel sounds positive.  Musculoskeletal: no clubbing / cyanosis. No joint deformity upper and lower extremities. Good ROM, no contractures. Normal muscle tone.  Skin: no rashes, lesions, ulcers. No induration Neurologic: CN 2-12 grossly intact. Sensation intact, DTR normal. Strength 5/5 in all 4.  Psychiatric: Normal judgment and insight. Alert and oriented x 3. Normal mood.    Labs on Admission: I have personally reviewed following labs and imaging  studies  CBC: Recent Labs  Lab 11/12/18 1628  WBC 11.9*  NEUTROABS 9.1*  HGB 15.3  HCT 50.3  MCV 105.5*  PLT 696   Basic Metabolic Panel: Recent Labs  Lab 11/12/18 1628  NA 144  K 4.8  CL 106  CO2 22  GLUCOSE 128*  BUN 36*  CREATININE 1.85*  CALCIUM 9.4   GFR: Estimated Creatinine Clearance: 27.6 mL/min (A) (by C-G formula based on SCr of 1.85 mg/dL (H)). Liver Function Tests: Recent Labs  Lab 11/12/18 1628  AST 43*  ALT 28  ALKPHOS 77  BILITOT 1.3*  PROT 7.8  ALBUMIN 3.6   No results for input(s): LIPASE, AMYLASE in the last 168 hours. No results for input(s): AMMONIA in the last 168 hours. Coagulation Profile: No results for input(s): INR, PROTIME in the last 168 hours. Cardiac Enzymes: Recent Labs  Lab 11/12/18 1628  TROPONINI 0.06*   BNP (last 3 results) Recent Labs    08/02/18 1152 08/09/18 1152 08/20/18 1148  PROBNP 869.0* 5,129* 4,156*   HbA1C: No results for input(s): HGBA1C in the last 72 hours. CBG: No results for input(s): GLUCAP in the last 168 hours. Lipid Profile: No results for input(s): CHOL, HDL, LDLCALC, TRIG, CHOLHDL, LDLDIRECT in the last 72 hours. Thyroid Function Tests: No results for input(s): TSH, T4TOTAL, FREET4, T3FREE, THYROIDAB in the last 72 hours. Anemia Panel: No results for input(s): VITAMINB12, FOLATE, FERRITIN, TIBC, IRON, RETICCTPCT in the last 72 hours. Urine analysis:    Component Value Date/Time   COLORURINE YELLOW 08/18/2016 [place] 08/18/2016 1534   LABSPEC 1.010 08/18/2016 1534   PHURINE 7.0 08/18/2016 1534   GLUCOSEU NEGATIVE 08/18/2016 1534   HGBUR NEGATIVE 08/18/2016 1534   HGBUR negative 10/29/2010 0807   BILIRUBINUR NEGATIVE 08/18/2016 1534   KETONESUR NEGATIVE 08/18/2016 1534   PROTEINUR NEGATIVE 02/01/2016 0011   UROBILINOGEN 0.2 08/18/2016 1534   NITRITE NEGATIVE 08/18/2016 1534   LEUKOCYTESUR NEGATIVE 08/18/2016 1534    Radiological Exams on Admission: Dg Chest  Port 1 View  Result Date: 11/12/2018 CLINICAL DATA:  Short of breath EXAM: PORTABLE CHEST 1 VIEW COMPARISON:  08/02/2018 FINDINGS: The heart remains moderately enlarged. Dense opacity at the right base has increased likely combination of pleural fluid and consolidation. Stable small left pleural effusion. Stable vascular congestion. No pneumothorax. Stable deformity of the peripheral right clavicle. IMPRESSION: Increasing opacity at the right lung base likely a combination of pleural fluid and consolidation. Electronically Signed   By: Marybelle Killings M.D.   On: 11/12/2018 16:49    EKG: Independently reviewed.  Assessment/Plan Principal Problem:   Acute on chronic diastolic CHF (congestive heart failure) (HCC) Active Problems:   Essential hypertension   Atrial fibrillation (HCC)   CKD (chronic kidney disease) stage 3, GFR 30-59 ml/min (HCC)   Aortic stenosis   AKI (acute kidney injury) ([place])    1. Acute on chronic diastolic CHF - 1. CHF pathway 2. Lasix 40mg  IV daily, first dose in ED 3. 2d echo 2. AKI on CKD stage 3 - 1. BMP daily with diuresis, but clearly fluid overloaded right now 2. Strict intake and output 3. AS - 1. See if this has progressed further with 2d echo, if so may need cards consult in AM 4. HTN - cont lopressor 5. A.Fib - 1. Tele monitor 2. Lopressor 3. Eliquis 6. Autoimmune hepatitis - continue daily prednisone  DVT prophylaxis: Eliquis Code Status: Full Family Communication: No family in room Disposition Plan: Home after admit Consults called: None Admission status: Place in obs    [place], [place] [facility] Pager [phone] Only works nights!  If 7AM-7PM, please contact the primary day team physician taking care of patient  [email or web address] Password [facility]  11/12/2018, 7:57 PM

## 2018-11-12 NOTE — ED Provider Notes (Signed)
[facility] DEPARTMENT Provider Note   CSN: [id number] Arrival date & time: 11/12/18  1624     History   Chief Complaint Chief Complaint  Patient presents with  . Respiratory Distress    HPI Arthur [name] is a 84 y.o. male.  Pt presents to the ED today with SOB.  Sx started yesterday and worsened today.  He called EMS and RA O2 sat was in the mid 70s.  He was put on a 100% NRB.  He has a hx of chronic afib, hyperlipidemia, htn, cva, and CHF.  The pt is on Eliquis.  He last saw cardiology in November.  He is supposed to take lasix 40 mg per day, but only takes 20 per their note.  Last ECHO on 08/02/18 showed EF of 65 to 70% with Aortic stenosis which is now moderate.  Pt denies any f/c or cough.  CHA2DS2/VAS Stroke Risk Points  Current as of about an hour ago     7 >= 2 Points: High Risk  1 - 1.99 Points: Medium Risk  0 Points: Low Risk    This is the only CHA2DS2/VAS Stroke Risk Points available for the past  year.:  Last Change: N/A     Details    This score determines the patient's risk of having a stroke if the  patient has atrial fibrillation.       Points Metrics  1 Has Congestive Heart Failure:  Yes    Current as of about an hour ago  1 Has Vascular Disease:  Yes    Current as of about an hour ago  1 Has Hypertension:  Yes    Current as of about an hour ago  2 Age:  61    Current as of about an hour ago  0 Has Diabetes:  No    Current as of about an hour ago  2 Had Stroke:  Yes  Had TIA:  Yes  Had thromboembolism:  Yes    Current as of about an hour ago  0 Male:  No    Current as of about an hour ago               Past Medical History:  Diagnosis Date  . Arrhythmia    1995  . Atrial fibrillation ([place])   . CHF (congestive heart failure) ([place]) 10/13/2016  . Coronary artery disease    MI, atherectomy 1993  . DDD (degenerative disc disease)   . Eczema   . Elevated LFTs   . History of gallstones   . History of  transient ischemic attack (TIA) 2005   double vision  . Hyperlipidemia   . Hypertension   . Myocardial infarct ([place]) 12/98   inferior  . Pneumonia    long time ago  . Skin cancer    melanoma in remission  . Stroke ([place])   . Varicose veins     Patient Active Problem List   Diagnosis Date Noted  . Aortic stenosis 11/12/2018  . AKI (acute kidney injury) ([place]) 11/12/2018  . Abnormal breath sounds 08/02/2018  . Pleural effusion, bilateral 04/14/2017  . Open leg wound, unspecified laterality, subsequent encounter 02/08/2017  . CHF (congestive heart failure) ([place]) 10/13/2016  . Encounter for well adult exam with abnormal findings 08/21/2016  . Hyperglycemia 08/21/2016  . Amaurosis fugax 07/08/2016  . Cerebrovascular accident (CVA) due to embolism of left middle cerebral artery ([place]) 03/21/2016  . Chronic anticoagulation 03/21/2016  . Ascending  aortic aneurysm ([place]) 03/21/2016  . Stroke (cerebrum) ([place]) 02/01/2016  . Stroke due to embolism of left middle cerebral artery ([place]) 01/31/2016  . Varicose veins of right lower extremity with complications 93/81/8299  . CKD (chronic kidney disease) stage 3, GFR 30-59 ml/min (HCC) 10/31/2015  . Acute respiratory failure ([place]) 10/28/2015  . CAP (community acquired pneumonia) 10/28/2015  . Acute renal insufficiency 10/28/2015  . SIRS (systemic inflammatory response syndrome) ([place]) 10/28/2015  . Lower back pain 10/03/2015  . Dyspnea 10/03/2015  . Abnormal bruising 10/03/2015  . Varicose veins of leg with complications 37/16/9678  . DVT (deep venous thrombosis) ([place]) 06/07/2015  . Venous ulcers of both lower extremities ([place]) 05/22/2015  . AAA (abdominal aortic aneurysm) without rupture- 4.6 05/07/2015  . Chronic cutaneous venous stasis ulcer ([place]) 05/05/2015  . Autoimmune hepatitis treated with steroids ([place]) 05/05/2015  . 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 Melanoma of skin ([place]) 07/27/2013  . Osteomyelitis of toe of  right foot 04/20/2013  . Current use of long term anticoagulation 03/04/2013  . PSA, INCREASED 10/18/2009  . Coronary atherosclerosis 10/16/2008  . ECZEMA 10/16/2008  . HLD (hyperlipidemia) 09/02/2007  . Essential hypertension 09/02/2007  . MYOCARDIAL INFARCTION, HX OF 09/02/2007  . Atrial fibrillation ([place]) 09/02/2007    Past Surgical History:  Procedure Laterality Date  . AMPUTATION Right 05/10/2013   Procedure: RIGHT SECOND TOE AMPUTATION;  Surgeon: Hessie Dibble, MD;  Location: [facility];  Service: Orthopedics;  Laterality: Right;  . APPENDECTOMY    . CARDIAC CATHETERIZATION  04/08/99   LAD 70% INTER 40-50% RCA 30-40%  . CHEST TUBE INSERTION Right 02/16/2017   Procedure: INSERTION PLEURAL DRAINAGE CATHETER;  Surgeon: Grace Isaac, MD;  Location: [facility];  Service: Thoracic;  Laterality: Right;  . CORONARY ANGIOPLASTY  04/08/99   LAD  . CORONARY ANGIOPLASTY  10/17/97   RCA   . CORONARY ANGIOPLASTY WITH STENT PLACEMENT  04/08/99   LAD  . MELANOMA EXCISION  2013   lt arm  . MELANOMA EXCISION Left 08/23/2013   Procedure: MELANOMA EXCISION;  Surgeon: Pedro Earls, MD;  Location: [facility];  Service: General;  Laterality: Left;  . REMOVAL OF PLEURAL DRAINAGE CATHETER Right 05/07/2017   Procedure: REMOVAL OF PLEURAL DRAINAGE CATHETER;  Surgeon: Grace Isaac, MD;  Location: [facility];  Service: Thoracic;  Laterality: Right;  . TONSILLECTOMY    . VIDEO BRONCHOSCOPY Bilateral 05/07/2015   Procedure: VIDEO BRONCHOSCOPY WITHOUT FLUORO;  Surgeon: Rigoberto Noel, MD;  Location: [facility];  Service: Cardiopulmonary;  Laterality: Bilateral;        Home Medications    Prior to Admission medications   Medication Sig Start Date End Date Taking? Authorizing Provider  Ascorbic Acid (VITAMIN C PO) Take 1 tablet by mouth daily.    [provider]  Cholecalciferol (VITAMIN D3) 5000 units CAPS Take 5,000 Units by mouth daily.    [provider]  ELIQUIS  2.5 MG TABS tablet TAKE 1 TABLET BY MOUTH TWICE A DAY 09/14/18   Sherren Mocha, MD  furosemide (LASIX) 20 MG tablet Take 2 tablets (40 mg total) by mouth daily. Patient taking differently: Take 20 mg by mouth daily.  08/09/18 08/04/19  [name], NP  metoprolol tartrate (LOPRESSOR) 25 MG tablet Take 1 tablet (25 mg total) by mouth daily. 09/29/18   Sherren Mocha, MD  predniSONE (DELTASONE) 5 MG tablet Take 2 tablets (10 mg total) by mouth daily with breakfast. 07/29/18  Irene Shipper, MD  VITAMIN A PO Take 1 capsule by mouth daily.    [provider]    Family History Family History  Problem Relation Age of Onset  . Heart attack Mother 12       MI  . Pneumonia Father     Social History Social History   Tobacco Use  . Smoking status: Former Smoker    Years: 7.00    Types: Cigars, Pipe    Last attempt to quit: 11/18/1967    Years since quitting: 51.0  . Smokeless tobacco: Never Used  . Tobacco comment: 1-2 cigars a day, pipe  Substance Use Topics  . Alcohol use: Yes    Alcohol/week: 1.0 standard drinks    Types: 1 Glasses of wine per week    Comment: 2-3 glasses of wine in the evening  . Drug use: No     Allergies   Penicillins   Review of Systems Review of Systems  Respiratory: Positive for shortness of breath.   All other systems reviewed and are negative.    Physical Exam Updated Vital Signs BP 126/67   Pulse 71   Temp 98.8 F (37.1 C)   Resp (!) 33   Ht 6' (1.829 m)   Wt 74.9 kg   SpO2 98%   BMI 22.39 kg/m   Physical Exam Vitals signs and nursing note reviewed.  Constitutional:      General: He is in acute distress.     Appearance: Normal appearance.  HENT:     Head: Normocephalic and atraumatic.     Right Ear: External ear normal.     Left Ear: External ear normal.     Nose: Nose normal.     Mouth/Throat:     Mouth: Mucous membranes are moist.  Eyes:     Extraocular Movements: Extraocular movements intact.     Pupils: Pupils  are equal, round, and reactive to light.  Neck:     Musculoskeletal: Normal range of motion and neck supple.  Cardiovascular:     Rate and Rhythm: Tachycardia present. Rhythm irregular.     Pulses: Normal pulses.     Heart sounds: Normal heart sounds.  Pulmonary:     Effort: Respiratory distress present.     Breath sounds: Rhonchi and rales present.  Abdominal:     General: Abdomen is flat. Bowel sounds are normal.     Palpations: Abdomen is soft.  Musculoskeletal:     Right lower leg: Edema present.     Left lower leg: Edema present.  Skin:    General: Skin is warm and dry.     Capillary Refill: Capillary refill takes less than 2 seconds.  Neurological:     General: No focal deficit present.     Mental Status: He is alert and oriented to person, place, and time.  Psychiatric:        Mood and Affect: Mood normal.        Behavior: Behavior normal.      ED Treatments / Results  Labs (all labs ordered are listed, but only abnormal results are displayed) Labs Reviewed  COMPREHENSIVE METABOLIC PANEL - Abnormal; Notable for the following components:      Result Value   Glucose, Bld 128 (*)    BUN 36 (*)    Creatinine, Ser 1.85 (*)    AST 43 (*)    Total Bilirubin 1.3 (*)    GFR calc non Af Amer 31 (*)  GFR calc Af Amer 36 (*)    Anion gap 16 (*)    All other components within normal limits  CBC WITH DIFFERENTIAL/PLATELET - Abnormal; Notable for the following components:   WBC 11.9 (*)    MCV 105.5 (*)    RDW 15.9 (*)    Neutro Abs 9.1 (*)    All other components within normal limits  TROPONIN I - Abnormal; Notable for the following components:   Troponin I 0.06 (*)    All other components within normal limits  BRAIN NATRIURETIC PEPTIDE - Abnormal; Notable for the following components:   B Natriuretic Peptide 664.8 (*)    All other components within normal limits  I-STAT ARTERIAL BLOOD GAS, ED - Abnormal; Notable for the following components:   pH, Arterial 7.464  (*)    pO2, Arterial 149.0 (*)    Acid-Base Excess 4.0 (*)    All other components within normal limits  TROPONIN I  TROPONIN I  TROPONIN I    EKG EKG Interpretation  Date/Time:  Friday November 12 2018 16:29:31 EST Ventricular Rate:  100 PR Interval:    QRS Duration: 96 QT Interval:  345 QTC Calculation: 391 R Axis:   -54 Text Interpretation:  Atrial fibrillation LVH with secondary repolarization abnormality Anterior infarct, old Poor data quality Confirmed by Isla Pence [phone]) on 11/12/2018 4:36:20 PM   Radiology Dg Chest Port 1 View  Result Date: 11/12/2018 CLINICAL DATA:  Short of breath EXAM: PORTABLE CHEST 1 VIEW COMPARISON:  08/02/2018 FINDINGS: The heart remains moderately enlarged. Dense opacity at the right base has increased likely combination of pleural fluid and consolidation. Stable small left pleural effusion. Stable vascular congestion. No pneumothorax. Stable deformity of the peripheral right clavicle. IMPRESSION: Increasing opacity at the right lung base likely a combination of pleural fluid and consolidation. Electronically Signed   By: Marybelle Killings M.D.   On: 11/12/2018 16:49    Procedures Procedures (including critical care time)  Medications Ordered in ED Medications  furosemide (LASIX) injection 40 mg (40 mg Intravenous Given 11/12/18 1718)     Initial Impression / Assessment and Plan / ED Course  I have reviewed the triage vital signs and the nursing notes.  Pertinent labs & imaging results that were available during my care of the patient were reviewed by me and considered in my medical decision making (see chart for details).    Bipap has improved patient significantly.  He does have a right sided pleural effusion which is worse than prior CXR.  The pt has had to have a right sided Pleurx catheter placed in the past for this problem, but it was removed in August of 2018.  Troponin is elevated, but this has been chronic.  Pt has been  doing so well on the bipap, that I asked RT to try to wean him off the bipap.  So far, he is tolerating this.    He was d/w Dr. Alcario Drought ([facility]) for admission.    CRITICAL CARE Performed by: Isla Pence   Total critical care time: 30 minutes  Critical care time was exclusive of separately billable procedures and treating other patients.  Critical care was necessary to treat or prevent imminent or life-threatening deterioration.  Critical care was time spent personally by me on the following activities: development of treatment plan with patient and/or surrogate as well as nursing, discussions with consultants, evaluation of patient's response to treatment, examination of patient, obtaining history from patient or surrogate, ordering and performing  treatments and interventions, ordering and review of laboratory studies, ordering and review of radiographic studies, pulse oximetry and re-evaluation of patient's condition.  Final Clinical Impressions(s) / ED Diagnoses   Final diagnoses:  CKD (chronic kidney disease) stage 3, GFR 30-59 ml/min (HCC)  Chronic atrial fibrillation    ED Discharge Orders    None       Isla Pence, MD 11/12/18 1950

## 2018-11-12 NOTE — ED Notes (Signed)
Breathing better water given

## 2018-11-12 NOTE — ED Notes (Signed)
The pt is breathing  Much better his family has gone home  Only a little urine from condon cath

## 2018-11-13 ENCOUNTER — Observation Stay (HOSPITAL_BASED_OUTPATIENT_CLINIC_OR_DEPARTMENT_OTHER): Payer: Medicare Other

## 2018-11-13 ENCOUNTER — Encounter (HOSPITAL_COMMUNITY): Payer: Self-pay | Admitting: Cardiology

## 2018-11-13 DIAGNOSIS — I361 Nonrheumatic tricuspid (valve) insufficiency: Secondary | ICD-10-CM

## 2018-11-13 DIAGNOSIS — I340 Nonrheumatic mitral (valve) insufficiency: Secondary | ICD-10-CM

## 2018-11-13 DIAGNOSIS — I5033 Acute on chronic diastolic (congestive) heart failure: Secondary | ICD-10-CM | POA: Diagnosis not present

## 2018-11-13 LAB — BASIC METABOLIC PANEL
Anion gap: 12 (ref 5–15)
BUN: 36 mg/dL — ABNORMAL HIGH (ref 8–23)
CALCIUM: 8.4 mg/dL — AB (ref 8.9–10.3)
CO2: 27 mmol/L (ref 22–32)
Chloride: 103 mmol/L (ref 98–111)
Creatinine, Ser: 1.72 mg/dL — ABNORMAL HIGH (ref 0.61–1.24)
GFR calc non Af Amer: 34 mL/min — ABNORMAL LOW (ref >60)
GFR, EST AFRICAN AMERICAN: 39 mL/min — AB (ref >60)
Glucose, Bld: 103 mg/dL — ABNORMAL HIGH (ref 70–99)
Potassium: 4.2 mmol/L (ref 3.5–5.1)
SODIUM: 142 mmol/L (ref 135–145)

## 2018-11-13 LAB — ECHOCARDIOGRAM COMPLETE
HEIGHTINCHES: 72 in
Weight: 2510.4 oz

## 2018-11-13 LAB — TROPONIN I
Troponin I: 0.09 ng/mL (ref <0.03)
Troponin I: 0.09 ng/mL (ref <0.03)

## 2018-11-13 MED ORDER — METOPROLOL TARTRATE 12.5 MG HALF TABLET
12.5000 mg | ORAL_TABLET | Freq: Two times a day (BID) | ORAL | Status: DC
  Administered 2018-11-13 – 2018-11-14 (×2): 12.5 mg via ORAL
  Filled 2018-11-13 (×2): qty 1

## 2018-11-13 MED ORDER — FUROSEMIDE 10 MG/ML IJ SOLN
40.0000 mg | Freq: Once | INTRAMUSCULAR | Status: AC
  Administered 2018-11-13: 40 mg via INTRAVENOUS
  Filled 2018-11-13: qty 4

## 2018-11-13 NOTE — Consult Note (Signed)
Cardiology Consultation:   Patient ID: CRIT OBREMSKI; [id number]; 1925/06/22   Admit date: 11/12/2018 Date of Consult: 11/13/2018  Primary Care Provider: Biagio Borg, MD Primary Cardiologist: Sherren Mocha, MD  Primary Electrophysiologist:     Patient Profile:   Arthur [name] is a 84 y.o. male with a hx of CAD and chronic atrial fib who is being seen today for the evaluation of SOB at the request of Dr. Berle Mull.  History of Present Illness:   Mr. Kronberg has a history of cardiac disease as listed below including CHF requiring admission for diuresis.  He was last seen in the office in September.  He was doing relatively well.  He was supposed to be taking 40 mg of Lasix daily at home.  He says his wife tells him he was supposed to be taking Lasix twice daily and he was only taking it once daily.  I believe that means he was only taking 20 mg.  His weight has been stable.  However, he started getting more short of breath over the last few days.  He thinks he been taking a lower dose of diuretic for perhaps 3 days.  Because he knew the symptoms and he knew he was starting to get volume overloaded he called EMS.  He was noted to have sats of 70% in the emergency room.  Chest x-ray suggested some increased right lower lobe consolidation versus edema.  He has chronic loculated right-sided effusion and it was thought that this was slightly increased.  He thinks his weights have been stable.  He has not been having any PND or orthopnea.  He has not been getting any chest pressure, neck or arm discomfort.  He is had no new fevers or chills though he has a little bit of a cough.  He has been given 1 dose of IV diuresis and his ins and outs are incomplete.  He does have an elevated BNP at 664.  His troponin is mildly elevated but flat.  Creatinine initially was elevated at 1.85 which is up above his baseline which is about 1.4.  It is down today.  He thinks his breathing is back to baseline.  He  has been on 2 L of nasal cannula oxygen however.  Past Medical History:  Diagnosis Date  . Arrhythmia    1995  . Atrial fibrillation ([place])   . CHF (congestive heart failure) ([place]) 10/13/2016  . Coronary artery disease    MI, atherectomy 1993  . DDD (degenerative disc disease)   . Eczema   . Elevated LFTs   . History of gallstones   . History of transient ischemic attack (TIA) 2005   double vision  . Hyperlipidemia   . Hypertension   . Myocardial infarct ([place]) 12/98   inferior  . Pneumonia    long time ago  . Skin cancer    melanoma in remission  . Stroke ([place])   . Varicose veins     Past Surgical History:  Procedure Laterality Date  . AMPUTATION Right 05/10/2013   Procedure: RIGHT SECOND TOE AMPUTATION;  Surgeon: Hessie Dibble, MD;  Location: [facility];  Service: Orthopedics;  Laterality: Right;  . APPENDECTOMY    . CARDIAC CATHETERIZATION  04/08/99   LAD 70% INTER 40-50% RCA 30-40%  . CHEST TUBE INSERTION Right 02/16/2017   Procedure: INSERTION PLEURAL DRAINAGE CATHETER;  Surgeon: Grace Isaac, MD;  Location: [facility];  Service: Thoracic;  Laterality: Right;  .  CORONARY ANGIOPLASTY  04/08/99   LAD  . CORONARY ANGIOPLASTY  10/17/97   RCA   . CORONARY ANGIOPLASTY WITH STENT PLACEMENT  04/08/99   LAD  . MELANOMA EXCISION  2013   lt arm  . MELANOMA EXCISION Left 08/23/2013   Procedure: MELANOMA EXCISION;  Surgeon: Pedro Earls, MD;  Location: [facility];  Service: General;  Laterality: Left;  . REMOVAL OF PLEURAL DRAINAGE CATHETER Right 05/07/2017   Procedure: REMOVAL OF PLEURAL DRAINAGE CATHETER;  Surgeon: Grace Isaac, MD;  Location: [facility];  Service: Thoracic;  Laterality: Right;  . TONSILLECTOMY    . VIDEO BRONCHOSCOPY Bilateral 05/07/2015   Procedure: VIDEO BRONCHOSCOPY WITHOUT FLUORO;  Surgeon: Rigoberto Noel, MD;  Location: [facility];  Service: Cardiopulmonary;  Laterality: Bilateral;     Home Medications:  Prior to Admission  medications   Medication Sig Start Date End Date Taking? Authorizing Provider  Ascorbic Acid (VITAMIN C PO) Take 1 tablet by mouth daily.    [provider]  Cholecalciferol (VITAMIN D3) 5000 units CAPS Take 5,000 Units by mouth daily.    [provider]  ELIQUIS 2.5 MG TABS tablet TAKE 1 TABLET BY MOUTH TWICE A DAY 09/14/18   Sherren Mocha, MD  furosemide (LASIX) 20 MG tablet Take 2 tablets (40 mg total) by mouth daily. Patient taking differently: Take 20 mg by mouth daily.  08/09/18 08/04/19  [name], NP  metoprolol tartrate (LOPRESSOR) 25 MG tablet Take 1 tablet (25 mg total) by mouth daily. 09/29/18   Sherren Mocha, MD  predniSONE (DELTASONE) 5 MG tablet Take 2 tablets (10 mg total) by mouth daily with breakfast. 07/29/18   Irene Shipper, MD  VITAMIN A PO Take 1 capsule by mouth daily.    [provider]    Inpatient Medications: Scheduled Meds: . apixaban  2.5 mg Oral BID  . furosemide  40 mg Intravenous Daily  . metoprolol tartrate  12.5 mg Oral BID  . predniSONE  10 mg Oral Q breakfast  . sodium chloride flush  3 mL Intravenous Q12H   Continuous Infusions: . sodium chloride     PRN Meds: sodium chloride, acetaminophen, ondansetron (ZOFRAN) IV, sodium chloride flush  Allergies:    Allergies  Allergen Reactions  . Penicillins Hives    Has patient had a PCN reaction causing immediate rash, facial/tongue/throat swelling, SOB or lightheadedness with hypotension:Yes Has patient had a PCN reaction causing severe rash involving mucus membranes or skin necrosis: No Has patient had a PCN reaction that required hospitalization: No Has patient had a PCN reaction occurring within the last 10 years: No If all of the above answers are "NO", then may proceed with Cephalosporin use.     Social History:   Social History   Socioeconomic History  . Marital status: Married    Spouse name: Not on file  . Number of children: 6  . Years of education:  Not on file  . Highest education level: Not on file  Occupational History  . Not on file  Social Needs  . Financial resource strain: Not hard at all  . Food insecurity:    Worry: Never true    Inability: Never true  . Transportation needs:    Medical: No    Non-medical: No  Tobacco Use  . Smoking status: Former Smoker    Years: 7.00    Types: Cigars, Pipe    Last attempt to quit: 11/18/1967    Years since quitting: 51.0  .  Smokeless tobacco: Never Used  . Tobacco comment: 1-2 cigars a day, pipe  Substance and Sexual Activity  . Alcohol use: Yes    Alcohol/week: 1.0 standard drinks    Types: 1 Glasses of wine per week    Comment: 2-3 glasses of wine in the evening  . Drug use: No  . Sexual activity: Not Currently  Lifestyle  . Physical activity:    Days per week: 4 days    Minutes per session: 50 min  . Stress: Only a little  Relationships  . Social connections:    Talks on phone: More than three times a week    Gets together: More than three times a week    Attends religious service: More than 4 times per year    Active member of club or organization: Yes    Attends meetings of clubs or organizations: More than 4 times per year    Relationship status: Married  . Intimate partner violence:    Fear of current or ex partner: No    Emotionally abused: No    Physically abused: No    Forced sexual activity: No  Other Topics Concern  . Not on file  Social History Narrative  . Not on file    Family History:    Family History  Problem Relation Age of Onset  . Heart attack Mother 35       MI  . Pneumonia Father      ROS:  Please see the history of present illness.  ROS  All other ROS reviewed and negative.     Physical Exam/Data:   Vitals:   11/12/18 2153 11/13/18 0446 11/13/18 0952 11/13/18 1323  BP: 109/66 105/66 117/67 124/74  Pulse: (!) 54 (!) 59 65 62  Resp: (!) 25 (!) 22  (!) 22  Temp: 98.9 F (37.2 C) 98.9 F (37.2 C)  98.4 F (36.9 C)  TempSrc:  Oral Axillary  Oral  SpO2: 96% 99% 97% 98%  Weight:  71.2 kg    Height:        Intake/Output Summary (Last 24 hours) at 11/13/2018 1624 Last data filed at 11/13/2018 1200 Gross per 24 hour  Intake 480 ml  Output 900 ml  Net -420 ml   Filed Weights   11/12/18 1722 11/13/18 0446  Weight: 74.9 kg 71.2 kg   Body mass index is 21.28 kg/m.  GENERAL:  Well appearing for his age 51:   Pupils equal round and reactive, fundi not visualized, oral mucosa unremarkable NECK:  No  jugular venous distention, waveform within normal limits, carotid upstroke brisk and symmetric, no bruits, no thyromegaly LYMPHATICS:  No cervical, inguinal adenopathy LUNGS:  Decreased breath sounds right greater than left base. BACK:  No CVA tenderness CHEST:   Unremarkable HEART:  PMI not displaced or sustained,S1 and S2 within normal limits, no S3, no S4, no clicks, no rubs, 3/6 apical systolic murmur radiating at the aortic outflow tract, no diastolic murmurs ABD:  Flat, positive bowel sounds normal in frequency in pitch, no bruits, no rebound, no guarding, no midline pulsatile mass, no hepatomegaly, no splenomegaly EXT:  2 plus pulses upper and decreased dorsalis pedis and posterior tibialis bilateral, mild ankle edema, no cyanosis no clubbing SKIN:  No rashes no nodules NEURO:   Cranial nerves II through XII grossly intact, motor grossly intact throughout PSYCH:    Cognitively intact, oriented to person place and time   EKG:  The EKG was personally reviewed and demonstrates:  Atrial fib, premature ectopic complexes, no acute ST T wave changes Telemetry:  Telemetry was personally reviewed and demonstrates:  Atrial fbi.   Relevant CV Studies: Echo  Study Conclusions  - Left ventricle: There was moderate concentric hypertrophy.   Systolic function was normal. The estimated ejection fraction was   in the range of 60% to 65%. Wall motion was normal; there were no   regional wall motion abnormalities. -  Aortic valve: Right coronary and left coronary cusp mobility was   severely restricted. There was moderate stenosis. There was   trivial regurgitation. Valve area (VTI): 1.01 cm^2. Valve area   (Vmax): 1.04 cm^2. Valve area (Vmean): 1.07 cm^2. - Mitral valve: Calcified annulus. Mildly thickened leaflets .   There was mild to moderate regurgitation directed eccentrically. - Left atrium: The atrium was severely dilated. - Right atrium: The atrium was severely dilated.  Laboratory Data:  Chemistry Recent Labs  Lab 11/12/18 1628 11/13/18 0706  NA 144 142  K 4.8 4.2  CL 106 103  CO2 22 27  GLUCOSE 128* 103*  BUN 36* 36*  CREATININE 1.85* 1.72*  CALCIUM 9.4 8.4*  GFRNONAA 31* 34*  GFRAA 36* 39*  ANIONGAP 16* 12    Recent Labs  Lab 11/12/18 1628  PROT 7.8  ALBUMIN 3.6  AST 43*  ALT 28  ALKPHOS 77  BILITOT 1.3*   Hematology Recent Labs  Lab 11/12/18 1628  WBC 11.9*  RBC 4.77  HGB 15.3  HCT 50.3  MCV 105.5*  MCH 32.1  MCHC 30.4  RDW 15.9*  PLT 162   Cardiac Enzymes Recent Labs  Lab 11/12/18 1628 11/12/18 1907 11/13/18 0109 11/13/18 0706  TROPONINI 0.06* 0.09* 0.09* 0.09*   No results for input(s): TROPIPOC in the last 168 hours.  BNP Recent Labs  Lab 11/12/18 1631  BNP 664.8*    DDimer No results for input(s): DDIMER in the last 168 hours.  Radiology/Studies:  Dg Chest Port 1 View  Result Date: 11/12/2018 CLINICAL DATA:  Short of breath EXAM: PORTABLE CHEST 1 VIEW COMPARISON:  08/02/2018 FINDINGS: The heart remains moderately enlarged. Dense opacity at the right base has increased likely combination of pleural fluid and consolidation. Stable small left pleural effusion. Stable vascular congestion. No pneumothorax. Stable deformity of the peripheral right clavicle. IMPRESSION: Increasing opacity at the right lung base likely a combination of pleural fluid and consolidation. Electronically Signed   By: Marybelle Killings M.D.   On: 11/12/2018 16:49     Assessment and Plan:   AORTIC STENOSIS:   Moderate on echo in Sept and again this admission.   This can be followed clinically.  If he has increasing issues with volume he might eventually need right and left heart cath but will defer to Dr. Burt Knack.  ACUTE ON CHRONIC DIASTOLIC HF:  Given only one dose of IV Lasix.  I will give another dose of IV Lasix today watching his renal function closely.  Switch to p.o.'s again tomorrow.  THORACIC ANEURYSM:  Noted previously and plan is for conservative management.   4.7 cm stable on repeated CTs.     CKD IV:  Creat is increased above baseline of 1.39 in Sept.   creatinine elevated as above and will follow.  CAD:   Elevated troponin.  However, given absence of convincing anginal symptoms no plans for invasive evaluation.    CHRONIC PLEURAL EFFUSION:  He has had a small chronic left and a loculated right effusion.    I do not  think that thoracentesis is indicated .  I did compare the September films with the films this admission.  Continue diuresis and follow up with PA and lateral CXR before discharge.     For questions or updates, please contact [facility] Please consult [email or web address] for contact info under Cardiology/STEMI.   Signed, Minus Breeding, MD  11/13/2018 4:24 PM

## 2018-11-13 NOTE — Progress Notes (Signed)
[facility] Progress Note  Patient: Arthur [name]:[id number]   PCP: Biagio Borg, MD DOB: 17-Sep-1925   DOA: 11/12/2018   DOS: 11/13/2018   Date of Service: the patient was seen and examined on 11/13/2018  Brief hospital course: Pt. with PMH of  diastolic CHF, A.fib on eliquis, HTN; admitted on 11/12/2018, presented with complaint of shortness of breath, was found to have recurrent pleural effusion and acute on chronic diastolic CHF. Currently further plan is continue diuresis..  Subjective: Patient generally is taking 2 tablets of Lasix on a daily basis and recently realized that his prescription only stays 1 tablet daily and 3 days ago started taking that.  Presented with complaints of shortness of breath. Still has shortness of breath no chest pain no nausea no vomiting.  No fever no chills.  No diarrhea.  Assessment and Plan: 1. Acute on chronic diastolic CHF Acute hypoxic respiratory failure Presented with respiratory distress and hypoxia, required BiPAP. Continue Lasix 40mg  IV daily Likely this is all associated with poor understanding of patient's Lasix prescription.  And dose reduction 3 days ago that the patient did himself. Monitor. Appreciate cardiology assistance. 2d Echocardiogram in September 19 shows 65 to 70% EF  2. AKI on CKD stage 3 - BMP daily with diuresis, but clearly fluid overloaded right now Renal function is actually getting better after diuresis. Strict intake and output  3. Moderate aortic stenosis.   Now presenting with CHF symptoms. Repeat echocardiogram still shows moderate aortic stenosis although patient's valve area number as well as VTI ratio is significant different from his recent echocardiogram performed September 19. It is not characteristic of aortic stenosis to progress this fast. cardiology consult requested, appreciate input.  4. HTN -  Sinus bradycardia. cont lopressor  5. Chronic A.Fib - Lopressor 25 mg daily, transition to  12.5 mg twice daily, will place holding parameters. Eliquis 2.5 mg twice daily.  6. Autoimmune hepatitis -  continue daily prednisone  7.  Elevated troponin. Trend not typical for ACS.  Minimally elevated. Most likely this is secondary to acute kidney injury and CKD as opposed to demand ischemia.  Diet: cardiac diet DVT Prophylaxis: on therapeutic anticoagulation.  Advance goals of care discussion: full code  Family Communication: no family was present at bedside, at the time of interview.   Disposition:  Discharge to be determined.  Consultants: none, will need cardiology  Procedures: Echocardiogram   Scheduled Meds: . apixaban  2.5 mg Oral BID  . furosemide  40 mg Intravenous Daily  . metoprolol tartrate  25 mg Oral Daily  . predniSONE  10 mg Oral Q breakfast  . sodium chloride flush  3 mL Intravenous Q12H   Continuous Infusions: . sodium chloride     PRN Meds: sodium chloride, acetaminophen, ondansetron (ZOFRAN) IV, sodium chloride flush Antibiotics: Anti-infectives (From admission, onward)   None       Objective: Physical Exam: Vitals:   11/12/18 2030 11/12/18 2100 11/12/18 2153 11/13/18 0446  BP: 103/65 114/71 109/66 105/66  Pulse: 61  (!) 54 (!) 59  Resp: (!) 23  (!) 25 (!) 22  Temp:   98.9 F (37.2 C) 98.9 F (37.2 C)  TempSrc:   Oral Axillary  SpO2: 98% 100% 96% 99%  Weight:    71.2 kg  Height:       No intake or output data in the 24 hours ending 11/13/18 0805 Filed Weights   11/12/18 1722 11/13/18 0446  Weight: 74.9 kg 71.2 kg  General: Alert, Awake and Oriented to Time, Place and Person. Appear in mild distress, affect appropriate Eyes: PERRL, Conjunctiva normal ENT: Oral Mucosa clear moist. Neck: no JVD, no Abnormal Mass Or lumps Cardiovascular: S1 and S2 Present, aortic systolic  Murmur, Peripheral Pulses Present Respiratory: normal respiratory effort, Bilateral Air entry equal and Decreased, no use of accessory muscle, bilateral basal  Crackles, no wheezes Abdomen: Bowel Sound present, Soft and no tenderness, no hernia Skin: no redness, no Rash, no induration Extremities: trace Pedal edema, no calf tenderness Neurologic: Grossly no focal neuro deficit. Bilaterally Equal motor strength  Data Reviewed: CBC: Recent Labs  Lab 11/12/18 1628  WBC 11.9*  NEUTROABS 9.1*  HGB 15.3  HCT 50.3  MCV 105.5*  PLT 263   Basic Metabolic Panel: Recent Labs  Lab 11/12/18 1628  NA 144  K 4.8  CL 106  CO2 22  GLUCOSE 128*  BUN 36*  CREATININE 1.85*  CALCIUM 9.4    Liver Function Tests: Recent Labs  Lab 11/12/18 1628  AST 43*  ALT 28  ALKPHOS 77  BILITOT 1.3*  PROT 7.8  ALBUMIN 3.6   No results for input(s): LIPASE, AMYLASE in the last 168 hours. No results for input(s): AMMONIA in the last 168 hours. Coagulation Profile: No results for input(s): INR, PROTIME in the last 168 hours. Cardiac Enzymes: Recent Labs  Lab 11/12/18 1628 11/12/18 1907 11/13/18 0109  TROPONINI 0.06* 0.09* 0.09*   BNP (last 3 results) Recent Labs    08/02/18 1152 08/09/18 1152 08/20/18 1148  PROBNP 869.0* 5,129* 4,156*   CBG: No results for input(s): GLUCAP in the last 168 hours. Studies: Dg Chest Port 1 View  Result Date: 11/12/2018 CLINICAL DATA:  Short of breath EXAM: PORTABLE CHEST 1 VIEW COMPARISON:  08/02/2018 FINDINGS: The heart remains moderately enlarged. Dense opacity at the right base has increased likely combination of pleural fluid and consolidation. Stable small left pleural effusion. Stable vascular congestion. No pneumothorax. Stable deformity of the peripheral right clavicle. IMPRESSION: Increasing opacity at the right lung base likely a combination of pleural fluid and consolidation. Electronically Signed   By: Marybelle Killings M.D.   On: 11/12/2018 16:49     Time spent: 35 minutes  Author: Berle Mull, MD [facility] Pager: [phone] 11/13/2018 8:05 AM  Between 7PM-7AM, please contact  night-coverage at [email or web address], password [facility]

## 2018-11-13 NOTE — Progress Notes (Signed)
  Echocardiogram 2D Echocardiogram has been performed.  Arthur [name] 11/13/2018, 9:16 AM

## 2018-11-13 NOTE — Progress Notes (Signed)
Patient declined CPAP, states he does not want to wear one.

## 2018-11-13 NOTE — Discharge Instructions (Signed)

## 2018-11-14 ENCOUNTER — Other Ambulatory Visit: Payer: Self-pay | Admitting: Nurse Practitioner

## 2018-11-14 DIAGNOSIS — I350 Nonrheumatic aortic (valve) stenosis: Secondary | ICD-10-CM | POA: Diagnosis not present

## 2018-11-14 DIAGNOSIS — I5033 Acute on chronic diastolic (congestive) heart failure: Secondary | ICD-10-CM | POA: Diagnosis not present

## 2018-11-14 DIAGNOSIS — I482 Chronic atrial fibrillation, unspecified: Secondary | ICD-10-CM | POA: Diagnosis not present

## 2018-11-14 DIAGNOSIS — N183 Chronic kidney disease, stage 3 (moderate): Secondary | ICD-10-CM | POA: Diagnosis not present

## 2018-11-14 DIAGNOSIS — I5032 Chronic diastolic (congestive) heart failure: Secondary | ICD-10-CM

## 2018-11-14 LAB — CBC
HCT: 39.2 % (ref 39.0–52.0)
Hemoglobin: 12.5 g/dL — ABNORMAL LOW (ref 13.0–17.0)
MCH: 32.5 pg (ref 26.0–34.0)
MCHC: 31.9 g/dL (ref 30.0–36.0)
MCV: 101.8 fL — ABNORMAL HIGH (ref 80.0–100.0)
Platelets: 108 10*3/uL — ABNORMAL LOW (ref 150–400)
RBC: 3.85 MIL/uL — ABNORMAL LOW (ref 4.22–5.81)
RDW: 15.8 % — ABNORMAL HIGH (ref 11.5–15.5)
WBC: 14.9 10*3/uL — ABNORMAL HIGH (ref 4.0–10.5)
nRBC: 0 % (ref 0.0–0.2)

## 2018-11-14 LAB — BASIC METABOLIC PANEL
Anion gap: 10 (ref 5–15)
BUN: 33 mg/dL — ABNORMAL HIGH (ref 8–23)
CALCIUM: 8.6 mg/dL — AB (ref 8.9–10.3)
CO2: 31 mmol/L (ref 22–32)
Chloride: 97 mmol/L — ABNORMAL LOW (ref 98–111)
Creatinine, Ser: 1.58 mg/dL — ABNORMAL HIGH (ref 0.61–1.24)
GFR calc Af Amer: 44 mL/min — ABNORMAL LOW (ref >60)
GFR calc non Af Amer: 38 mL/min — ABNORMAL LOW (ref >60)
Glucose, Bld: 88 mg/dL (ref 70–99)
Potassium: 3.8 mmol/L (ref 3.5–5.1)
SODIUM: 138 mmol/L (ref 135–145)

## 2018-11-14 LAB — MAGNESIUM: Magnesium: 2 mg/dL (ref 1.7–2.4)

## 2018-11-14 MED ORDER — FUROSEMIDE 40 MG PO TABS: 40.0000 mg | ORAL_TABLET | Freq: Every morning | ORAL | 0 refills | Status: DC

## 2018-11-14 MED ORDER — APIXABAN 2.5 MG PO TABS: 2.5000 mg | ORAL_TABLET | Freq: Two times a day (BID) | ORAL | 5 refills | Status: AC

## 2018-11-14 MED ORDER — FUROSEMIDE 20 MG PO TABS: 20.0000 mg | ORAL_TABLET | Freq: Every evening | ORAL | 0 refills | Status: DC

## 2018-11-14 MED ORDER — FUROSEMIDE 40 MG PO TABS
40.0000 mg | ORAL_TABLET | Freq: Every morning | ORAL | Status: DC
  Administered 2018-11-14: 40 mg via ORAL
  Filled 2018-11-14: qty 1

## 2018-11-14 MED ORDER — FUROSEMIDE 20 MG PO TABS: 20.0000 mg | ORAL_TABLET | Freq: Every evening | ORAL | Status: DC

## 2018-11-14 MED ORDER — METOPROLOL TARTRATE 25 MG PO TABS: 12.5000 mg | ORAL_TABLET | Freq: Two times a day (BID) | ORAL | 0 refills | Status: DC

## 2018-11-14 NOTE — Progress Notes (Signed)
Progress Note  Patient Name: Arthur [name] Date of Encounter: 11/14/2018  Primary Cardiologist: Sherren Mocha, MD   Subjective   Feeling better today with no shortness of breath  Inpatient Medications    Scheduled Meds: . apixaban  2.5 mg Oral BID  . metoprolol tartrate  12.5 mg Oral BID  . predniSONE  10 mg Oral Q breakfast  . sodium chloride flush  3 mL Intravenous Q12H   Continuous Infusions: . sodium chloride     PRN Meds: sodium chloride, acetaminophen, ondansetron (ZOFRAN) IV, sodium chloride flush   Vital Signs    Vitals:   11/13/18 1931 11/13/18 2013 11/14/18 0258 11/14/18 0619  BP: 109/69   112/81  Pulse: 65 73 65 72  Resp: (!) 33  (!) 23 (!) 22  Temp: 99.2 F (37.3 C)   (!) 97.4 F (36.3 C)  TempSrc: Oral   Oral  SpO2: 97%  97% 99%  Weight:    71.8 kg  Height:        Intake/Output Summary (Last 24 hours) at 11/14/2018 0801 Last data filed at 11/14/2018 0600 Gross per 24 hour  Intake 723 ml  Output 2780 ml  Net -2057 ml   Filed Weights   11/12/18 1722 11/13/18 0446 11/14/18 0619  Weight: 74.9 kg 71.2 kg 71.8 kg    Telemetry    Atrial fibrillation with occasional PVCs- Personally Reviewed  ECG    No new EKG to review- Personally Reviewed  Physical Exam   GEN: No acute distress.   Neck: No JVD Cardiac:  Irregularly irregular, no murmurs, rubs, or gallops.  Respiratory: Clear to auscultation bilaterally. GI: Soft, nontender, non-distended  MS: No edema; No deformity. Neuro:  Nonfocal  Psych: Normal affect   Labs    Chemistry Recent Labs  Lab 11/12/18 1628 11/13/18 0706 11/14/18 0453  NA 144 142 138  K 4.8 4.2 3.8  CL 106 103 97*  CO2 22 27 31   GLUCOSE 128* 103* 88  BUN 36* 36* 33*  CREATININE 1.85* 1.72* 1.58*  CALCIUM 9.4 8.4* 8.6*  PROT 7.8  --   --   ALBUMIN 3.6  --   --   AST 43*  --   --   ALT 28  --   --   ALKPHOS 77  --   --   BILITOT 1.3*  --   --   GFRNONAA 31* 34* 38*  GFRAA 36* 39* 44*  ANIONGAP  16* 12 10     Hematology Recent Labs  Lab 11/12/18 1628 11/14/18 0453  WBC 11.9* 14.9*  RBC 4.77 3.85*  HGB 15.3 12.5*  HCT 50.3 39.2  MCV 105.5* 101.8*  MCH 32.1 32.5  MCHC 30.4 31.9  RDW 15.9* 15.8*  PLT 162 108*    Cardiac Enzymes Recent Labs  Lab 11/12/18 1628 11/12/18 1907 11/13/18 0109 11/13/18 0706  TROPONINI 0.06* 0.09* 0.09* 0.09*   No results for input(s): TROPIPOC in the last 168 hours.   BNP Recent Labs  Lab 11/12/18 1631  BNP 664.8*     DDimer No results for input(s): DDIMER in the last 168 hours.   Radiology    Dg Chest Port 1 View  Result Date: 11/12/2018 CLINICAL DATA:  Short of breath EXAM: PORTABLE CHEST 1 VIEW COMPARISON:  08/02/2018 FINDINGS: The heart remains moderately enlarged. Dense opacity at the right base has increased likely combination of pleural fluid and consolidation. Stable small left pleural effusion. Stable vascular congestion. No pneumothorax. Stable deformity of  the peripheral right clavicle. IMPRESSION: Increasing opacity at the right lung base likely a combination of pleural fluid and consolidation. Electronically Signed   By: Marybelle Killings M.D.   On: 11/12/2018 16:49    Cardiac Studies   Echo  Study Conclusions  - Left ventricle: There was moderate concentric hypertrophy. Systolic function was normal. The estimated ejection fraction was in the range of 60% to 65%. Wall motion was normal; there were no regional wall motion abnormalities. - Aortic valve: Right coronary and left coronary cusp mobility was severely restricted. There was moderate stenosis. There was trivial regurgitation. Valve area (VTI): 1.01 cm^2. Valve area (Vmax): 1.04 cm^2. Valve area (Vmean): 1.07 cm^2. - Mitral valve: Calcified annulus. Mildly thickened leaflets . There was mild to moderate regurgitation directed eccentrically. - Left atrium: The atrium was severely dilated. - Right atrium: The atrium was severely  dilated.   Patient Profile     84 y.o. male with a hx of CAD and chronic atrial fib who is being seen today for the evaluation of SOB and moderate AS by echo.  Assessment & Plan    AORTIC STENOSIS:   Moderate on echo in Sept and again this admission.   This can be followed clinically.  If he has increasing issues with volume he might eventually need right and left heart cath but will defer to Dr. Burt Knack as an outpt.   ACUTE ON CHRONIC DIASTOLIC HF:   -Given IV lasix yesterday -breathing improved -He put out 2.78 L yesterday and is net -1.8 L. -Weight is down 7 pounds from admission -Creatinine improving with diuresis and is down from 1.72 yesterday to 1.58 today. -We will change to Lasix 40 mg p.o. every morning and 20 mg every afternoon starting today.  THORACIC ANEURYSM:  Noted previously and plan is for conservative management.   4.7 cm stable on repeated CTs.     CKD IV:   -Creatinine improving with diuresis.  His creatinine was 0.72 yesterday and now is 1.58   CAD:   Elevated troponin but flat trend at 0.093.  2D echo showed normal LV function.Marland Kitchen  However, given absence of convincing anginal symptoms no plans for invasive evaluation.    CHRONIC PLEURAL EFFUSION:  He has had a small chronic left and a loculated right effusion.    I do not think that thoracentesis is indicated .   Continue diuresis and follow up with PA and lateral CXR before discharge.    Chronic atrial fibrillation: He is well rate controlled on exam today.  He will continue on Lopressor 12.5 mg twice daily and apixaban 2.5 mg twice daily.  This is dosed for age greater than 66 and creatinine greater than 1.5.  [facility] will sign off.   Medication Recommendations: Apixaban 2.5 mg twice daily, Lopressor 12.5 mg twice daily and Lasix 40 mg every morning and 20 mg nightly Other recommendations (labs, testing, etc): Bmet in 1 week Follow up as an outpatient: Follow-up outpatient in 1 week with extender and  in structural heart clinic   For questions or updates, please contact [facility] Please consult [email or web address] for contact info under Cardiology/STEMI.      Signed, Fransico Him, MD  11/14/2018, 8:01 AM

## 2018-11-14 NOTE — Care Management Note (Signed)
Case Management Note  Patient Details  Name: Arthur [name] MRN: [id number] Date of Birth: 1925-09-01  Subjective/Objective:                    Action/Plan:  Patient will DC to home today with Home oxygen. Notified [facility] of new set up and to deliver to room prior to DC. No other CM needs identified.   Expected Discharge Date:  11/14/18               Expected Discharge Plan:  Home/Self Care  In-House Referral:     Discharge planning Services  CM Consult  Post Acute Care Choice:  Durable Medical Equipment Choice offered to:     DME Arranged:  Oxygen DME Agency:  [place]:    [facility] Agency:     Status of Service:  Completed, signed off  If discussed at [place] of Stay Meetings, dates discussed:    Additional Comments:  Carles Collet, RN 11/14/2018, 12:35 PM

## 2018-11-14 NOTE — Progress Notes (Addendum)
SATURATION QUALIFICATIONS: (This note is used to comply with regulatory documentation for home oxygen)  Patient Saturations on Room Air at Rest = 92%  Patient Saturations on Room Air while Ambulating = 83%  Patient Saturations on 2 Liters of oxygen while Ambulating = 95%  Please briefly explain why patient needs home oxygen: Pt O2 drops below 90% without oxygen therapy while ambulating.

## 2018-11-14 NOTE — Discharge Summary (Signed)
Physician Discharge Summary  Arthur [name] ENI:[id number] DOB: 11-Mar-1925 DOA: 11/12/2018  PCP: Biagio Borg, MD  Admit date: 11/12/2018 Discharge date: 11/14/2018  Admitted From: Home Disposition:  Home  Discharge Condition:Stable CODE STATUS:FULL Diet recommendation: Heart Healthy   Brief/Interim Summary: Pt. with PMH of diastolic CHF, A.fib on eliquis, HTN; admitted on 11/12/2018, presented with complaint of shortness of breath, was found to have recurrent pleural effusion and acute on chronic diastolic CHF.Admitted for IV diuresis.Patient generally is taking 2 tablets of Lasix on a daily basis and recently realized that his prescription only stays 1 tablet daily and 3 days ago started taking that.    Patient was seen by cardiology here.  Underwent significant diuresis.  Weight is down to 7 pounds from admission.  Kidney function improved.  Currently he is off oxygen. Patient cleared by cardiology for discharge.  He is stable for discharge to home today on oral diuretics.  He will follow-up with cardiology as an outpatient.  Appointment already scheduled.  Follow-up problems were addressed during his hospitalization:   1. Acute on chronic diastolic CHF Acute hypoxic respiratory failure Presented with respiratory distress and hypoxia, required BiPAP. Was on  Lasix 40mg  IV daily here. Likely this is all associated with poor understanding of patient's Lasix prescription and dose reduction 3 days ago that the patient did himself. 2d Echocardiogram  shows 60-65%. We will continue on Lasix 40 mg in the morning and 20 mg at night. Recommended him to restrict his fluid intake to less than 2 L a day and salt intake to less than 2 g a day.  Recommended to continue monitoring his weight at home.  He will follow-up with cardiology as an outpatient.  2. AKI on CKD stage 3  Renal function is actually getting better after diuresis. Check BMP in a week.  3. Moderate aortic stenosis.   Now  presenting with CHF symptoms. Repeat echocardiogram still shows moderate aortic stenosis although patient's valve area number as well as VTI ratio is significant different from his recent echocardiogram performed September 19. Cardiology recommended to follow-up as an outpatient.  He will follow-up with Dr. Burt Knack.  4. HTN -  cont lopressor  5. Chronic A.Fib - Lopressor 25 mg daily, transition to 12.5 mg twice daily.Eliquis 2.5 mg twice daily.  6. Autoimmune hepatitis -  continue daily prednisone  7.  Elevated troponin. Trend not typical for ACS.  Minimally elevated. Most likely this is secondary to acute kidney injury and CKD as opposed to demand ischemia.Denied chest pain.  8.  Chronic pleural effusion: He has a small chronic left and loculated right-sided effusion.  No need of thoracentesis at this point.  Continue diuresis.  Follow-up as an outpatient.    Discharge Diagnoses:  Principal Problem:   Acute on chronic diastolic CHF (congestive heart failure) (HCC) Active Problems:   Essential hypertension   Atrial fibrillation (HCC)   CKD (chronic kidney disease) stage 3, GFR 30-59 ml/min (HCC)   Aortic stenosis   AKI (acute kidney injury) [facility])    Discharge Instructions  Discharge Instructions    Diet - low sodium heart healthy   Complete by:  As directed    Discharge instructions   Complete by:  As directed    1) Please take prescribed medications as instructed.  Take Lasix 40 mg in the morning and 20 mg in the evening. Monitor your weight at home.  Restrict water intake to less than 2 L a day and salt to less  than 2 g a day. 2)Follow up with your PCP as an outpatient in a week.  Do a BMP test to check your kidney function and electrolytes. 3)Follow up with cardiology as an outpatient.   Increase activity slowly   Complete by:  As directed      Allergies as of 11/14/2018      Reactions   Penicillins Hives   Has patient had a PCN reaction causing immediate  rash, facial/tongue/throat swelling, SOB or lightheadedness with hypotension:Yes Has patient had a PCN reaction causing severe rash involving mucus membranes or skin necrosis: No Has patient had a PCN reaction that required hospitalization: No Has patient had a PCN reaction occurring within the last 10 years: No If all of the above answers are "NO", then may proceed with Cephalosporin use.      Medication List    TAKE these medications   apixaban 2.5 MG Tabs tablet Commonly known as:  ELIQUIS Take 1 tablet (2.5 mg total) by mouth 2 (two) times daily. What changed:  how much to take   furosemide 20 MG tablet Commonly known as:  LASIX Take 1 tablet (20 mg total) by mouth every evening. What changed:  You were already taking a medication with the same name, and this prescription was added. Make sure you understand how and when to take each.   furosemide 40 MG tablet Commonly known as:  LASIX Take 1 tablet (40 mg total) by mouth every morning. Start taking on:  November 15, 2018 What changed:    medication strength  when to take this   metoprolol tartrate 25 MG tablet Commonly known as:  LOPRESSOR Take 1 tablet (25 mg total) by mouth daily.   predniSONE 5 MG tablet Commonly known as:  DELTASONE Take 2 tablets (10 mg total) by mouth daily with breakfast.   VITAMIN A PO Take 1 capsule by mouth daily.   VITAMIN C PO Take 1 tablet by mouth daily.   Vitamin D3 125 MCG (5000 UT) Caps Take 5,000 Units by mouth daily.      Follow-up Information    [facility] Follow up on 11/19/2018.   Why:  We would like you to come for lab work (check kidney function and electrolytes). You may come anytime between 8:30a and 3p. Contact information: [place] [address] [phone]       Sherren Mocha, MD Follow up on 12/02/2018.   Specialty:  Cardiology Why:  11:20 AM Contact information: [address] [place] [address] [phone]          Allergies  Allergen Reactions  . Penicillins Hives    Has patient had a PCN reaction causing immediate rash, facial/tongue/throat swelling, SOB or lightheadedness with hypotension:Yes Has patient had a PCN reaction causing severe rash involving mucus membranes or skin necrosis: No Has patient had a PCN reaction that required hospitalization: No Has patient had a PCN reaction occurring within the last 10 years: No If all of the above answers are "NO", then may proceed with Cephalosporin use.     Consultations:  Cardiology   Procedures/Studies: Dg Chest Port 1 View  Result Date: 11/12/2018 CLINICAL DATA:  Short of breath EXAM: PORTABLE CHEST 1 VIEW COMPARISON:  08/02/2018 FINDINGS: The heart remains moderately enlarged. Dense opacity at the right base has increased likely combination of pleural fluid and consolidation. Stable small left pleural effusion. Stable vascular congestion. No pneumothorax. Stable deformity of  the peripheral right clavicle. IMPRESSION: Increasing opacity at the right lung base likely a combination of pleural fluid and consolidation. Electronically Signed   By: Marybelle Killings M.D.   On: 11/12/2018 16:49       Subjective: Patient seen and examined the bedside this morning.  Currently off oxygen.  Saturating fine on room air.  He feels better today.  Stable for discharge.  Discharge Exam: Vitals:   11/14/18 0803 11/14/18 1100  BP: 136/82   Pulse: 72 63  Resp: (!) 27 (!) 25  Temp:    SpO2: 97% 96%   Vitals:   11/14/18 0258 11/14/18 0619 11/14/18 0803 11/14/18 1100  BP:  112/81 136/82   Pulse: 65 72 72 63  Resp: (!) 23 (!) 22 (!) 27 (!) 25  Temp:  (!) 97.4 F (36.3 C)    TempSrc:  Oral    SpO2: 97% 99% 97% 96%  Weight:  71.8 kg    Height:        General: Pt is alert, awake, not in acute distress Cardiovascular: RRR, S1/S2 +, no rubs, no gallops Respiratory: Decreased air  entry in the bases, crackles on the basis  abdominal: Soft, NT, ND, bowel sounds + Extremities: Trace edema, no cyanosis    The results of significant diagnostics from this hospitalization (including imaging, microbiology, ancillary and laboratory) are listed below for reference.     Microbiology: No results found for this or any previous visit (from the past 240 hour(s)).   Labs: BNP (last 3 results) Recent Labs    11/12/18 1631  BNP 160.1*   Basic Metabolic Panel: Recent Labs  Lab 11/12/18 1628 11/13/18 0706 11/14/18 0453  NA 144 142 138  K 4.8 4.2 3.8  CL 106 103 97*  CO2 22 27 31   GLUCOSE 128* 103* 88  BUN 36* 36* 33*  CREATININE 1.85* 1.72* 1.58*  CALCIUM 9.4 8.4* 8.6*  MG  --   --  2.0   Liver Function Tests: Recent Labs  Lab 11/12/18 1628  AST 43*  ALT 28  ALKPHOS 77  BILITOT 1.3*  PROT 7.8  ALBUMIN 3.6   No results for input(s): LIPASE, AMYLASE in the last 168 hours. No results for input(s): AMMONIA in the last 168 hours. CBC: Recent Labs  Lab 11/12/18 1628 11/14/18 0453  WBC 11.9* 14.9*  NEUTROABS 9.1*  --   HGB 15.3 12.5*  HCT 50.3 39.2  MCV 105.5* 101.8*  PLT 162 108*   Cardiac Enzymes: Recent Labs  Lab 11/12/18 1628 11/12/18 1907 11/13/18 0109 11/13/18 0706  TROPONINI 0.06* 0.09* 0.09* 0.09*   BNP: Invalid input(s): POCBNP CBG: No results for input(s): GLUCAP in the last 168 hours. D-Dimer No results for input(s): DDIMER in the last 72 hours. Hgb A1c No results for input(s): HGBA1C in the last 72 hours. Lipid Profile No results for input(s): CHOL, HDL, LDLCALC, TRIG, CHOLHDL, LDLDIRECT in the last 72 hours. Thyroid function studies No results for input(s): TSH, T4TOTAL, T3FREE, THYROIDAB in the last 72 hours.  Invalid input(s): FREET3 Anemia work up No results for input(s): VITAMINB12, FOLATE, FERRITIN, TIBC, IRON, RETICCTPCT in the last 72 hours. Urinalysis    Component Value Date/Time   COLORURINE YELLOW 08/18/2016  1534   APPEARANCEUR CLEAR 08/18/2016 1534   LABSPEC 1.010 08/18/2016 1534   PHURINE 7.0 08/18/2016 1534   GLUCOSEU NEGATIVE 08/18/2016 1534   HGBUR NEGATIVE 08/18/2016 1534   HGBUR negative 10/29/2010 0807   BILIRUBINUR NEGATIVE 08/18/2016 1534   KETONESUR  NEGATIVE 08/18/2016 1534   PROTEINUR NEGATIVE 02/01/2016 0011   UROBILINOGEN 0.2 08/18/2016 1534   NITRITE NEGATIVE 08/18/2016 1534   LEUKOCYTESUR NEGATIVE 08/18/2016 1534   Sepsis Labs Invalid input(s): PROCALCITONIN,  WBC,  LACTICIDVEN Microbiology No results found for this or any previous visit (from the past 240 hour(s)).  Please note: You were cared for by a hospitalist during your hospital stay. Once you are discharged, your primary care physician will handle any further medical issues. Please note that NO REFILLS for any discharge medications will be authorized once you are discharged, as it is imperative that you return to your primary care physician (or establish a relationship with a primary care physician if you do not have one) for your post hospital discharge needs so that they can reassess your need for medications and monitor your lab values.    Time coordinating discharge: 40 minutes  SIGNED:   Shelly Coss, MD  [facility] 11/14/2018, 11:18 AM Pager [phone]  If 7PM-7AM, please contact night-coverage [email or web address] Password [email or web address]

## 2018-11-15 ENCOUNTER — Telehealth: Payer: Self-pay | Admitting: *Deleted

## 2018-11-15 NOTE — Telephone Encounter (Signed)
Pt was on the TCM report admitted on12/27/2019, forshortness of breath, was found to haverecurrent pleural effusion and acute on chronic diastolic CHF.

## 2018-11-15 NOTE — Telephone Encounter (Signed)
Patient was seen by cardiologist, and underwent significant diuresis.  Weight is down to 7 pounds from admission.  Kidney function improved.  Currently he is off oxygen. Pt D/C 11/14/18, and will follow-up w/cardiolody 11/20/18.Marland KitchenJohny Chess

## 2018-11-18 ENCOUNTER — Telehealth: Payer: Self-pay

## 2018-11-18 NOTE — Telephone Encounter (Signed)
-----   Message from [name], NP sent at 11/18/2018  8:50 AM EST ----- So, can you look at that MyChart message - looks like it was not taken care of - not sent to me - not sure where it went.    We can see if I have any spots prior to Cooper's visit - needs to keep Cooper's visit.   Home health typically done by PCP but I'm ok with ordering for diastolic HF.   [name]

## 2018-11-18 NOTE — Telephone Encounter (Signed)
Patient's daughter Paula Libra returning the phone call. Appointment made for patient to see Truitt Merle, NP on 11/22/18 at 12:00 PM. Made daughter aware that home health is typically managed by PCP but [name] would be willing to order if they did not want to manage. Daughter states that she will discuss with PCP and let us know if she needs [place] to order it. Daughter made aware that the patient will still need to keep follow-up appointment with Dr. Burt Knack on 1/16. Daughter denies having any other concerns at this time and was appreciative of the call.

## 2018-11-18 NOTE — Telephone Encounter (Signed)
Left message for daughter Santiago Glad [facility] on file) to call back.

## 2018-11-18 NOTE — Telephone Encounter (Signed)
See telephone encounter 11/18/18

## 2018-11-18 NOTE — Telephone Encounter (Signed)
Follow Up:; ° ° °Returning your call. °

## 2018-11-19 ENCOUNTER — Other Ambulatory Visit: Payer: 59 | Admitting: *Deleted

## 2018-11-19 DIAGNOSIS — I5032 Chronic diastolic (congestive) heart failure: Secondary | ICD-10-CM

## 2018-11-20 LAB — BASIC METABOLIC PANEL
BUN/Creatinine Ratio: 28 — ABNORMAL HIGH (ref 10–24)
BUN: 35 mg/dL (ref 10–36)
CALCIUM: 8.9 mg/dL (ref 8.6–10.2)
CO2: 27 mmol/L (ref 20–29)
Chloride: 93 mmol/L — ABNORMAL LOW (ref 96–106)
Creatinine, Ser: 1.26 mg/dL (ref 0.76–1.27)
GFR calc Af Amer: 57 mL/min/{1.73_m2} — ABNORMAL LOW (ref >59)
GFR calc non Af Amer: 50 mL/min/{1.73_m2} — ABNORMAL LOW (ref >59)
Glucose: 198 mg/dL — ABNORMAL HIGH (ref 65–99)
Potassium: 4.7 mmol/L (ref 3.5–5.2)
Sodium: 139 mmol/L (ref 134–144)

## 2018-11-21 ENCOUNTER — Inpatient Hospital Stay (HOSPITAL_COMMUNITY): Payer: Medicare Other

## 2018-11-21 ENCOUNTER — Other Ambulatory Visit: Payer: Self-pay

## 2018-11-21 ENCOUNTER — Emergency Department (HOSPITAL_COMMUNITY): Payer: Medicare Other

## 2018-11-21 ENCOUNTER — Encounter (HOSPITAL_COMMUNITY): Payer: Self-pay

## 2018-11-21 ENCOUNTER — Inpatient Hospital Stay (HOSPITAL_COMMUNITY)
Admission: EM | Admit: 2018-11-21 | Discharge: 2018-11-25 | DRG: 871 | Disposition: A | Discharge status: Home Health | Payer: Medicare Other | Attending: Internal Medicine | Admitting: Internal Medicine

## 2018-11-21 DIAGNOSIS — I252 Old myocardial infarction: Secondary | ICD-10-CM

## 2018-11-21 DIAGNOSIS — I482 Chronic atrial fibrillation, unspecified: Secondary | ICD-10-CM | POA: Diagnosis not present

## 2018-11-21 DIAGNOSIS — Z87891 Personal history of nicotine dependence: Secondary | ICD-10-CM

## 2018-11-21 DIAGNOSIS — Y95 Nosocomial condition: Secondary | ICD-10-CM | POA: Diagnosis present

## 2018-11-21 DIAGNOSIS — I739 Peripheral vascular disease, unspecified: Secondary | ICD-10-CM | POA: Diagnosis present

## 2018-11-21 DIAGNOSIS — Z79899 Other long term (current) drug therapy: Secondary | ICD-10-CM

## 2018-11-21 DIAGNOSIS — I4891 Unspecified atrial fibrillation: Secondary | ICD-10-CM | POA: Diagnosis present

## 2018-11-21 DIAGNOSIS — I5030 Unspecified diastolic (congestive) heart failure: Secondary | ICD-10-CM | POA: Diagnosis not present

## 2018-11-21 DIAGNOSIS — E785 Hyperlipidemia, unspecified: Secondary | ICD-10-CM | POA: Diagnosis present

## 2018-11-21 DIAGNOSIS — J9601 Acute respiratory failure with hypoxia: Secondary | ICD-10-CM | POA: Diagnosis present

## 2018-11-21 DIAGNOSIS — I712 Thoracic aortic aneurysm, without rupture: Secondary | ICD-10-CM | POA: Diagnosis present

## 2018-11-21 DIAGNOSIS — Z9981 Dependence on supplemental oxygen: Secondary | ICD-10-CM

## 2018-11-21 DIAGNOSIS — Z955 Presence of coronary angioplasty implant and graft: Secondary | ICD-10-CM

## 2018-11-21 DIAGNOSIS — Z8673 Personal history of transient ischemic attack (TIA), and cerebral infarction without residual deficits: Secondary | ICD-10-CM | POA: Diagnosis not present

## 2018-11-21 DIAGNOSIS — I444 Left anterior fascicular block: Secondary | ICD-10-CM | POA: Diagnosis present

## 2018-11-21 DIAGNOSIS — I5032 Chronic diastolic (congestive) heart failure: Secondary | ICD-10-CM | POA: Diagnosis present

## 2018-11-21 DIAGNOSIS — J9600 Acute respiratory failure, unspecified whether with hypoxia or hypercapnia: Secondary | ICD-10-CM | POA: Diagnosis present

## 2018-11-21 DIAGNOSIS — J9811 Atelectasis: Secondary | ICD-10-CM | POA: Diagnosis present

## 2018-11-21 DIAGNOSIS — J90 Pleural effusion, not elsewhere classified: Secondary | ICD-10-CM | POA: Diagnosis present

## 2018-11-21 DIAGNOSIS — I130 Hypertensive heart and chronic kidney disease with heart failure and stage 1 through stage 4 chronic kidney disease, or unspecified chronic kidney disease: Secondary | ICD-10-CM | POA: Diagnosis present

## 2018-11-21 DIAGNOSIS — Z7952 Long term (current) use of systemic steroids: Secondary | ICD-10-CM

## 2018-11-21 DIAGNOSIS — N183 Chronic kidney disease, stage 3 unspecified: Secondary | ICD-10-CM | POA: Diagnosis present

## 2018-11-21 DIAGNOSIS — R042 Hemoptysis: Secondary | ICD-10-CM | POA: Diagnosis present

## 2018-11-21 DIAGNOSIS — A403 Sepsis due to Streptococcus pneumoniae: Secondary | ICD-10-CM | POA: Diagnosis present

## 2018-11-21 DIAGNOSIS — I10 Essential (primary) hypertension: Secondary | ICD-10-CM | POA: Diagnosis not present

## 2018-11-21 DIAGNOSIS — R209 Unspecified disturbances of skin sensation: Secondary | ICD-10-CM | POA: Diagnosis not present

## 2018-11-21 DIAGNOSIS — J189 Pneumonia, unspecified organism: Secondary | ICD-10-CM | POA: Diagnosis not present

## 2018-11-21 DIAGNOSIS — K754 Autoimmune hepatitis: Secondary | ICD-10-CM | POA: Diagnosis present

## 2018-11-21 DIAGNOSIS — I350 Nonrheumatic aortic (valve) stenosis: Secondary | ICD-10-CM | POA: Diagnosis present

## 2018-11-21 DIAGNOSIS — I509 Heart failure, unspecified: Secondary | ICD-10-CM

## 2018-11-21 DIAGNOSIS — A408 Other streptococcal sepsis: Secondary | ICD-10-CM | POA: Diagnosis not present

## 2018-11-21 DIAGNOSIS — I2510 Atherosclerotic heart disease of native coronary artery without angina pectoris: Secondary | ICD-10-CM | POA: Diagnosis present

## 2018-11-21 DIAGNOSIS — Z7901 Long term (current) use of anticoagulants: Secondary | ICD-10-CM

## 2018-11-21 DIAGNOSIS — Z89421 Acquired absence of other right toe(s): Secondary | ICD-10-CM | POA: Diagnosis not present

## 2018-11-21 DIAGNOSIS — Z880 Allergy status to penicillin: Secondary | ICD-10-CM

## 2018-11-21 DIAGNOSIS — A419 Sepsis, unspecified organism: Secondary | ICD-10-CM | POA: Diagnosis present

## 2018-11-21 LAB — URINALYSIS, ROUTINE W REFLEX MICROSCOPIC
Bilirubin Urine: NEGATIVE
Glucose, UA: NEGATIVE mg/dL
Hgb urine dipstick: NEGATIVE
Ketones, ur: NEGATIVE mg/dL
LEUKOCYTES UA: NEGATIVE
Nitrite: NEGATIVE
Protein, ur: NEGATIVE mg/dL
Specific Gravity, Urine: 1.015 (ref 1.005–1.030)
pH: 5 (ref 5.0–8.0)

## 2018-11-21 LAB — CBC WITH DIFFERENTIAL/PLATELET
Abs Immature Granulocytes: 0.05 10*3/uL (ref 0.00–0.07)
BASOS ABS: 0 10*3/uL (ref 0.0–0.1)
Basophils Relative: 0 %
EOS PCT: 0 %
Eosinophils Absolute: 0 10*3/uL (ref 0.0–0.5)
HEMATOCRIT: 44.7 % (ref 39.0–52.0)
Hemoglobin: 14.3 g/dL (ref 13.0–17.0)
Immature Granulocytes: 0 %
Lymphocytes Relative: 8 %
Lymphs Abs: 0.9 10*3/uL (ref 0.7–4.0)
MCH: 32.5 pg (ref 26.0–34.0)
MCHC: 32 g/dL (ref 30.0–36.0)
MCV: 101.6 fL — ABNORMAL HIGH (ref 80.0–100.0)
Monocytes Absolute: 1.1 10*3/uL — ABNORMAL HIGH (ref 0.1–1.0)
Monocytes Relative: 10 %
NRBC: 0 % (ref 0.0–0.2)
Neutro Abs: 9.6 10*3/uL — ABNORMAL HIGH (ref 1.7–7.7)
Neutrophils Relative %: 82 %
Platelets: 259 10*3/uL (ref 150–400)
RBC: 4.4 MIL/uL (ref 4.22–5.81)
RDW: 14.8 % (ref 11.5–15.5)
WBC: 11.7 10*3/uL — ABNORMAL HIGH (ref 4.0–10.5)

## 2018-11-21 LAB — PROTIME-INR
INR: 1.38
Prothrombin Time: 16.8 seconds — ABNORMAL HIGH (ref 11.4–15.2)

## 2018-11-21 LAB — COMPREHENSIVE METABOLIC PANEL
ALT: 34 U/L (ref 0–44)
AST: 44 U/L — ABNORMAL HIGH (ref 15–41)
Albumin: 2.2 g/dL — ABNORMAL LOW (ref 3.5–5.0)
Alkaline Phosphatase: 147 U/L — ABNORMAL HIGH (ref 38–126)
Anion gap: 11 (ref 5–15)
BUN: 30 mg/dL — ABNORMAL HIGH (ref 8–23)
CO2: 29 mmol/L (ref 22–32)
Calcium: 9.1 mg/dL (ref 8.9–10.3)
Chloride: 95 mmol/L — ABNORMAL LOW (ref 98–111)
Creatinine, Ser: 1.38 mg/dL — ABNORMAL HIGH (ref 0.61–1.24)
GFR calc Af Amer: 51 mL/min — ABNORMAL LOW (ref >60)
GFR calc non Af Amer: 44 mL/min — ABNORMAL LOW (ref >60)
Glucose, Bld: 116 mg/dL — ABNORMAL HIGH (ref 70–99)
POTASSIUM: 4.3 mmol/L (ref 3.5–5.1)
Sodium: 135 mmol/L (ref 135–145)
TOTAL PROTEIN: 7.1 g/dL (ref 6.5–8.1)
Total Bilirubin: 2.1 mg/dL — ABNORMAL HIGH (ref 0.3–1.2)

## 2018-11-21 LAB — BRAIN NATRIURETIC PEPTIDE: B Natriuretic Peptide: 902.1 pg/mL — ABNORMAL HIGH (ref 0.0–100.0)

## 2018-11-21 LAB — I-STAT ARTERIAL BLOOD GAS, ED
Acid-Base Excess: 9 mmol/L — ABNORMAL HIGH (ref 0.0–2.0)
Bicarbonate: 32.7 mmol/L — ABNORMAL HIGH (ref 20.0–28.0)
O2 Saturation: 95 %
Patient temperature: 102
TCO2: 34 mmol/L — ABNORMAL HIGH (ref 22–32)
pCO2 arterial: 43.5 mmHg (ref 32.0–48.0)
pH, Arterial: 7.491 — ABNORMAL HIGH (ref 7.350–7.450)
pO2, Arterial: 78 mmHg — ABNORMAL LOW (ref 83.0–108.0)

## 2018-11-21 LAB — INFLUENZA PANEL BY PCR (TYPE A & B)
Influenza A By PCR: NEGATIVE
Influenza B By PCR: NEGATIVE

## 2018-11-21 LAB — I-STAT CG4 LACTIC ACID, ED
Lactic Acid, Venous: 2.38 mmol/L (ref 0.5–1.9)
Lactic Acid, Venous: 1.44 mmol/L (ref 0.5–1.9)

## 2018-11-21 MED ORDER — VITAMIN D 25 MCG (1000 UNIT) PO TABS
5000.0000 [IU] | ORAL_TABLET | Freq: Every day | ORAL | Status: DC
  Administered 2018-11-21 – 2018-11-25 (×5): 5000 [IU] via ORAL
  Filled 2018-11-21 (×5): qty 5

## 2018-11-21 MED ORDER — SODIUM CHLORIDE 0.9 % IV BOLUS: 250.0000 mL | Freq: Once | INTRAVENOUS | Status: DC

## 2018-11-21 MED ORDER — PREDNISONE 10 MG PO TABS
10.0000 mg | ORAL_TABLET | Freq: Every day | ORAL | Status: DC
  Administered 2018-11-22 – 2018-11-25 (×4): 10 mg via ORAL
  Filled 2018-11-21 (×4): qty 1

## 2018-11-21 MED ORDER — SODIUM CHLORIDE 0.9 % IV SOLN
2.0000 g | Freq: Once | INTRAVENOUS | Status: AC
  Administered 2018-11-21: 2 g via INTRAVENOUS
  Filled 2018-11-21: qty 2

## 2018-11-21 MED ORDER — BENZONATATE 100 MG PO CAPS
100.0000 mg | ORAL_CAPSULE | Freq: Three times a day (TID) | ORAL | Status: DC
  Administered 2018-11-21 – 2018-11-25 (×12): 100 mg via ORAL
  Filled 2018-11-21 (×12): qty 1

## 2018-11-21 MED ORDER — VANCOMYCIN HCL IN DEXTROSE 1-5 GM/200ML-% IV SOLN
1000.0000 mg | Freq: Once | INTRAVENOUS | Status: DC
  Filled 2018-11-21: qty 200

## 2018-11-21 MED ORDER — VANCOMYCIN HCL 10 G IV SOLR
1500.0000 mg | Freq: Once | INTRAVENOUS | Status: AC
  Administered 2018-11-21: 1500 mg via INTRAVENOUS
  Filled 2018-11-21: qty 1500

## 2018-11-21 MED ORDER — FUROSEMIDE 10 MG/ML IJ SOLN
20.0000 mg | Freq: Once | INTRAMUSCULAR | Status: AC
  Administered 2018-11-21: 20 mg via INTRAVENOUS
  Filled 2018-11-21: qty 2

## 2018-11-21 MED ORDER — VANCOMYCIN HCL IN DEXTROSE 1-5 GM/200ML-% IV SOLN: 1000.0000 mg | Freq: Two times a day (BID) | INTRAVENOUS | Status: DC

## 2018-11-21 MED ORDER — VITAMIN D3 125 MCG (5000 UT) PO CAPS: 5000.0000 [IU] | ORAL_CAPSULE | Freq: Every day | ORAL | Status: DC

## 2018-11-21 MED ORDER — VANCOMYCIN HCL IN DEXTROSE 1-5 GM/200ML-% IV SOLN
1000.0000 mg | Freq: Two times a day (BID) | INTRAVENOUS | Status: DC
  Administered 2018-11-22: 1000 mg via INTRAVENOUS
  Filled 2018-11-21 (×2): qty 200

## 2018-11-21 MED ORDER — SODIUM CHLORIDE 0.9 % IV SOLN
1.0000 g | Freq: Three times a day (TID) | INTRAVENOUS | Status: DC
  Administered 2018-11-22 (×2): 1 g via INTRAVENOUS
  Filled 2018-11-21 (×3): qty 1

## 2018-11-21 MED ORDER — LEVALBUTEROL HCL 0.63 MG/3ML IN NEBU
0.6300 mg | INHALATION_SOLUTION | Freq: Four times a day (QID) | RESPIRATORY_TRACT | Status: DC
  Administered 2018-11-21 – 2018-11-22 (×4): 0.63 mg via RESPIRATORY_TRACT
  Filled 2018-11-21 (×7): qty 3

## 2018-11-21 MED ORDER — HYDROCORTISONE NA SUCCINATE PF 100 MG IJ SOLR
50.0000 mg | Freq: Once | INTRAMUSCULAR | Status: AC
  Administered 2018-11-21: 50 mg via INTRAVENOUS
  Filled 2018-11-21 (×2): qty 2

## 2018-11-21 MED ORDER — APIXABAN 2.5 MG PO TABS
2.5000 mg | ORAL_TABLET | Freq: Two times a day (BID) | ORAL | Status: DC
  Administered 2018-11-21 – 2018-11-22 (×2): 2.5 mg via ORAL
  Filled 2018-11-21 (×3): qty 1

## 2018-11-21 MED ORDER — ACETAMINOPHEN 500 MG PO TABS
500.0000 mg | ORAL_TABLET | Freq: Once | ORAL | Status: AC
  Administered 2018-11-21: 500 mg via ORAL
  Filled 2018-11-21: qty 1

## 2018-11-21 MED ORDER — GUAIFENESIN ER 600 MG PO TB12
1200.0000 mg | ORAL_TABLET | Freq: Two times a day (BID) | ORAL | Status: DC
  Administered 2018-11-21 – 2018-11-25 (×8): 1200 mg via ORAL
  Filled 2018-11-21 (×8): qty 2

## 2018-11-21 NOTE — ED Provider Notes (Signed)
Medical screening examination/treatment/procedure(s) were conducted as a shared visit with non-physician practitioner(s) and myself.  I personally evaluated the patient during the encounter.  EKG Interpretation  Date/Time:  Sunday November 21 2018 16:05:16 EST Ventricular Rate:  94 PR Interval:    QRS Duration: 98 QT Interval:  356 QTC Calculation: 445 R Axis:   -60 Text Interpretation:  Atrial fibrillation with premature ventricular or aberrantly conducted complexes Left anterior fascicular block Anterior infarct , age undetermined Abnormal ECG No significant change since last tracing Confirmed by Orlie Dakin [id number]) on 11/21/2018 4:13:70 PM  85 year old male presents with cough congestion times several days.  Fever here 102.2.  Clinically likely has pneumonia.  X-ray of his chest is pending at this time.  Code sepsis called and patient be started on empiric antibiotics and admitted to the hospital pending his labs.   Lacretia Leigh, MD 11/21/18 [phone]

## 2018-11-21 NOTE — ED Notes (Signed)
Contact info for pt's wife. Contact when pt gets bed placement.   Arthur [name]: [phone]

## 2018-11-21 NOTE — Progress Notes (Signed)
Pharmacy Antibiotic Note  Arthur [name] is a 85 y.o. male admitted on 11/21/2018 with pneumonia.  Pharmacy has been consulted for vancomycin dosing. The patient has received one dose of Cefepime for gm negative coverage. WBC 11.7. SCr 1.38.   Plan: -Vancomycin 1500 mg IV load, then vancomycin 1 gm IV Q 24 hours  -Monitor CBC, renal fx, cultures and clinical progress -Vanc trough/peak at steady state   Height: 6' (182.9 cm) Weight: 158 lb 11.7 oz (72 kg) IBW/kg (Calculated) : 77.6  Temp (24hrs), Avg:101 F (38.3 C), Min:99.8 F (37.7 C), Max:102.2 F (39 C)  Recent Labs  Lab 11/19/18 1343 11/21/18 1619 11/21/18 1626 11/21/18 1820  WBC  --  11.7*  --   --   CREATININE 1.26 1.38*  --   --   LATICACIDVEN  --   --  2.38* 1.44    Estimated Creatinine Clearance: 35.5 mL/min (A) (by C-G formula based on SCr of 1.38 mg/dL (H)).    Allergies  Allergen Reactions  . Penicillins Hives    Has patient had a PCN reaction causing immediate rash, facial/tongue/throat swelling, SOB or lightheadedness with hypotension:Yes Has patient had a PCN reaction causing severe rash involving mucus membranes or skin necrosis: No Has patient had a PCN reaction that required hospitalization: No Has patient had a PCN reaction occurring within the last 10 years: No If all of the above answers are "NO", then may proceed with Cephalosporin use.     Antimicrobials this admission: Vanc 1/5 >>  Cefepime 1/5 >>    Thank you for allowing pharmacy to be a part of this patient's care.  Albertina Parr, PharmD., BCPS Clinical Pharmacist Clinical phone for 11/21/18 until 8:30pm: [phone] If after 8:30pm, please refer to [facility] for unit-specific pharmacist

## 2018-11-21 NOTE — ED Triage Notes (Signed)
Pt arrives POV for eval of congestion and SOB. Pt is tachypneic to the 30s on arrival satting 87% pt denies SOB however resps appear labored. LS coarse throuhgout w/ rhonchi noted. Pt feels warm to touch, oral temp 99.6. States he does have home O2 but has not required use of it since 1.5 wks ago.

## 2018-11-21 NOTE — H&P (Addendum)
History and Physical        Hospital Admission Note Date: 11/21/2018  Patient name: Arthur [name] Medical record number: [id number] Date of birth: 05/04/1925 Age: 85 y.o. Gender: male  PCP: Biagio Borg, MD    Patient coming from: home  I have reviewed all records in the [facility].    Chief Complaint:  Chest congestion with fever, shortness of breath  HPI: Patient is a 85 year old male with history of diastolic CHF, atrial fibrillation on Eliquis, hypertension, who was recently discharged on 11/14/2018 been admitted for acute on chronic diastolic CHF.  Patient returned back today with hypoxia, O2 sats 87%, respiratory rate to 30s and labored breathing.  Patient reported that he felt close to baseline for the first 2 days after he got home, then started getting short of breath, coughing and generalized weakness.  In the last 24 hours, patient had significant chest congestion, shortness of breath with productive coughing, yellowish sputum. Patient was found to have right lower lung pneumonia with the large pleural effusion, hypoxic and hospitalist service was called for admission.  ED work-up/course:  At home, uses 2 L nasal cannula, in the ED was found to be febrile, tachypneic, increased work of breathing In ED 102.2 F, respiratory 32, heart rate in 40s, BP 106/67, O2 sats 87% on room air  Review of Systems: Positives marked in 'bold' Constitutional: + fever, chills, diaphoresis, poor appetite and fatigue.  HEENT: Denies photophobia, eye pain, redness, hearing loss, ear pain, congestion, sore throat, rhinorrhea, sneezing, mouth sores, trouble swallowing, neck pain, neck stiffness and tinnitus.   Respiratory: +SOB, DOE, cough, chest tightness,  and wheezing.   Cardiovascular: Denies chest pain, palpitations and leg swelling.  Gastrointestinal: Denies nausea, vomiting,  abdominal pain, diarrhea, constipation, blood in stool and abdominal distention.  Genitourinary: Denies dysuria, urgency, frequency, hematuria, flank pain and difficulty urinating.  Musculoskeletal: Denies myalgias, back pain, joint swelling, arthralgias and gait problem.  Skin: Denies pallor, rash and wound.  Neurological: Denies dizziness, seizures, syncope, light-headedness, numbness and headaches. + Generalized weakness Hematological: Denies adenopathy. Easy bruising, personal or family bleeding history  Psychiatric/Behavioral: Denies suicidal ideation, mood changes, confusion, nervousness, sleep disturbance and agitation  Past Medical History: Past Medical History:  Diagnosis Date  . Arrhythmia    1995  . Atrial fibrillation ([place])   . CHF (congestive heart failure) ([place]) 10/13/2016  . Coronary artery disease    MI, atherectomy 1993  . DDD (degenerative disc disease)   . Eczema   . Elevated LFTs   . History of gallstones   . History of transient ischemic attack (TIA) 2005   double vision  . Hyperlipidemia   . Hypertension   . Pneumonia    long time ago  . Skin cancer    melanoma in remission  . Stroke [facility])     Past Surgical History:  Procedure Laterality Date  . AMPUTATION Right 05/10/2013   Procedure: RIGHT SECOND TOE AMPUTATION;  Surgeon: Hessie Dibble, MD;  Location: [place];  Service: Orthopedics;  Laterality: Right;  . APPENDECTOMY    . CARDIAC CATHETERIZATION  04/08/99   LAD 70% INTER 40-50% RCA 30-40%  .  CHEST TUBE INSERTION Right 02/16/2017   Procedure: INSERTION PLEURAL DRAINAGE CATHETER;  Surgeon: Grace Isaac, MD;  Location: [place];  Service: Thoracic;  Laterality: Right;  . CORONARY ANGIOPLASTY  04/08/99   LAD  . CORONARY ANGIOPLASTY  10/17/97   RCA   . CORONARY ANGIOPLASTY WITH STENT PLACEMENT  04/08/99   LAD  . MELANOMA EXCISION  2013   lt arm  . MELANOMA EXCISION Left 08/23/2013   Procedure: MELANOMA EXCISION;  Surgeon:  Pedro Earls, MD;  Location: [facility];  Service: General;  Laterality: Left;  . REMOVAL OF PLEURAL DRAINAGE CATHETER Right 05/07/2017   Procedure: REMOVAL OF PLEURAL DRAINAGE CATHETER;  Surgeon: Grace Isaac, MD;  Location: [place];  Service: Thoracic;  Laterality: Right;  . TONSILLECTOMY    . VIDEO BRONCHOSCOPY Bilateral 05/07/2015   Procedure: VIDEO BRONCHOSCOPY WITHOUT FLUORO;  Surgeon: Rigoberto Noel, MD;  Location: [place];  Service: Cardiopulmonary;  Laterality: Bilateral;    Medications: Prior to Admission medications   Medication Sig Start Date End Date Taking? Authorizing Provider  apixaban (ELIQUIS) 2.5 MG TABS tablet Take 1 tablet (2.5 mg total) by mouth 2 (two) times daily. 11/14/18  Yes Shelly Coss, MD  Ascorbic Acid (VITAMIN C PO) Take 1 tablet by mouth daily.   Yes [provider]  Cholecalciferol (VITAMIN D3) 5000 units CAPS Take 5,000 Units by mouth daily.   Yes [provider]  furosemide (LASIX) 20 MG tablet Take 1 tablet (20 mg total) by mouth every evening. 11/14/18  Yes Shelly Coss, MD  furosemide (LASIX) 40 MG tablet Take 1 tablet (40 mg total) by mouth every morning. 11/15/18  Yes Shelly Coss, MD  metoprolol tartrate (LOPRESSOR) 25 MG tablet Take 0.5 tablets (12.5 mg total) by mouth 2 (two) times daily. 11/14/18  Yes Shelly Coss, MD  predniSONE (DELTASONE) 5 MG tablet Take 2 tablets (10 mg total) by mouth daily with breakfast. 07/29/18  Yes Irene Shipper, MD  VITAMIN A PO Take 1 capsule by mouth daily.   Yes [provider]    Allergies:   Allergies  Allergen Reactions  . Penicillins Hives    Has patient had a PCN reaction causing immediate rash, facial/tongue/throat swelling, SOB or lightheadedness with hypotension:Yes Has patient had a PCN reaction causing severe rash involving mucus membranes or skin necrosis: No Has patient had a PCN reaction that required hospitalization: No Has patient had a PCN reaction  occurring within the last 10 years: No If all of the above answers are "NO", then may proceed with Cephalosporin use.     Social History:  reports that he quit smoking about 51 years ago. His smoking use included cigars and pipe. He quit after 7.00 years of use. He has never used smokeless tobacco. He reports current alcohol use of about 1.0 standard drinks of alcohol per week. He reports that he does not use drugs.  Family History: Family History  Problem Relation Age of Onset  . Heart attack Mother 4       MI  . Pneumonia Father     Physical Exam: Blood pressure 105/61, pulse (!) 42, temperature (!) 102.2 F (39 C), temperature source Rectal, resp. rate 19, height 6' (1.829 m), weight 72 kg, SpO2 97 %. General: Alert, awake, oriented x3, increased work of breathing. Eyes: pink conjunctiva,anicteric sclera, pupils equal and reactive to light and accomodation, HEENT: normocephalic, atraumatic, oropharynx clear Neck: supple, no masses or lymphadenopathy, no goiter, no bruits, no JVD CVS:  Regular rate and rhythm, without murmurs, rubs or gallops. 1+ lower extremity edema Resp : Bilateral diffuse coarse rhonchi GI : Soft, nontender, nondistended, positive bowel sounds, no masses. No hepatomegaly. No hernia.  Musculoskeletal: No clubbing or cyanosis, positive pedal pulses. No contracture. ROM intact  Neuro: Grossly intact, no focal neurological deficits, strength 5/5 upper and lower extremities bilaterally Psych: alert and oriented x 3, normal mood and affect Skin: no rashes or lesions, warm and dry   LABS on Admission: I have personally reviewed all the labs and imagings below    Basic Metabolic Panel: Recent Labs  Lab 11/19/18 1343 11/21/18 1619  NA 139 135  K 4.7 4.3  CL 93* 95*  CO2 27 29  GLUCOSE 198* 116*  BUN 35 30*  CREATININE 1.26 1.38*  CALCIUM 8.9 9.1   Liver Function Tests: Recent Labs  Lab 11/21/18 1619  AST 44*  ALT 34  ALKPHOS 147*  BILITOT 2.1*    PROT 7.1  ALBUMIN 2.2*   No results for input(s): LIPASE, AMYLASE in the last 168 hours. No results for input(s): AMMONIA in the last 168 hours. CBC: Recent Labs  Lab 11/21/18 1619  WBC 11.7*  NEUTROABS 9.6*  HGB 14.3  HCT 44.7  MCV 101.6*  PLT 259   Cardiac Enzymes: No results for input(s): CKTOTAL, CKMB, CKMBINDEX, TROPONINI in the last 168 hours. BNP: Invalid input(s): POCBNP CBG: No results for input(s): GLUCAP in the last 168 hours.  Radiological Exams on Admission:  Dg Chest Portable 1 View  Result Date: 11/21/2018 CLINICAL DATA:  Cough, fever and sepsis. Recent hospital admission for CHF. EXAM: PORTABLE CHEST 1 VIEW COMPARISON:  Chest radiograph November 12, 2018 FINDINGS: Increased moderate to large RIGHT pleural effusion with underlying airspace opacities. Mild chronic interstitial changes. Stable cardiomegaly. Calcified aortic arch. No pneumothorax. Included soft tissue planes and osseous structures are unchanged. Stable solitary sclerotic lesion midthoracic spine. IMPRESSION: 1. Increased moderate to large RIGHT pleural effusion with underlying airspace opacity. 2. Stable cardiomegaly. 3.  Aortic Atherosclerosis (ICD10-I70.0). Electronically Signed   By: Elon Alas M.D.   On: 11/21/2018 17:19      EKG: Independently reviewed.  Rate 94, atrial fibrillation   Assessment/Plan Principal Problem:   Sepsis ([place]) with acute respiratory failure with hypoxia, secondary to moderate to large right-sided pleural effusion with HCAP -Patient met sepsis criteria at the time of admission with fever, leukocytosis, tachypnea, hypoxia source likely secondary to pneumonia -Obtain blood cultures, urine strep antigen, urine Legionella antigen, influenza PCR, sputum cultures, O2 via nasal cannula -Placed on IV vancomycin and cefepime -Obtain right-sided lateral decubitus chest x-ray, ultrasound-guided thoracentesis in a.m. studies ordered with IR consult.   Active  Problems:    Essential hypertension -BP currently soft, heart rate in 40s, hold metoprolol, will give low-dose Lasix 20 mg IV x1 -Patient has been on 10 mg prednisone daily, may have relative adrenal insufficiency, will give 1 dose of IV hydrocortisone 50 mg   Chronic atrial fibrillation (HCC) -Currently heart rate in 40s, hold metoprolol -Continue Eliquis, patient does not want to hold Eliquis      CKD (chronic kidney disease) stage 3, GFR 30-59 ml/min (HCC) -Baseline creatinine 1.2-1.3, creatinine currently at baseline  Chronic diastolic CHF (congestive heart failure) ([place]) -At home, on Lasix 40 mg in the morning and 20 mg in the evening - will give 1 dose of Lasix 20 mg IV x1, reassess in a.m to restart Lasix as tolerated  History of autoimmune hepatitis -Continue daily  prednisone 10 mg daily  DVT prophylaxis: Eliquis  CODE STATUS: Full CODE STATUS  Consults called:   Family Communication: Admission, patients condition and plan of care including tests being ordered have been discussed with the patient, wife and daughter who indicates understanding and agree with the plan and Code Status  Admission status:   Disposition plan: Further plan will depend as patient's clinical course evolves and further radiologic and laboratory data become available.    At the time of admission, it appears that the appropriate admission status for this patient is INPATIENT . This is judged to be reasonable and necessary in order to provide the required intensity of service to ensure the patient's safety given the presenting symptoms acute hypoxic respiratory failure with pneumonia and large pleural effusion, physical exam findings, and initial radiographic and laboratory data in the context of their chronic comorbidities.  The medical decision making on this patient was of high complexity and the patient is at high risk for clinical deterioration, therefore this is a level 3 visit.   Time Spent  on Admission: 70 minutes    Eagan Shifflett M.D. [facility] 11/21/2018, 6:27 PM Pager: [phone]  If 7PM-7AM, please contact night-coverage [email or web address] Password [email or web address]

## 2018-11-21 NOTE — ED Notes (Signed)
Condom cath was placed on pt. RN of this pt was notified and family as well.

## 2018-11-21 NOTE — ED Triage Notes (Signed)
Pt placed on 3LNC in triage, improvement in sats to 91%.

## 2018-11-21 NOTE — ED Provider Notes (Signed)
[facility] EMERGENCY DEPARTMENT Provider Note   CSN: [id number] Arrival date & time: 11/21/18  1558     History   Chief Complaint Chief Complaint  Patient presents with  . Shortness of Breath    HPI Arthur [name] is a 85 y.o. male with h/o diastolic heart failure EF 60-65%, moderate aortic valve stenosis, CAD s/p MI s/p PCI 2000, atrial fibrillation on Eloquis and lopressor, HTN, HLD, CVA, melanoma in remission, autoimmune hepatitis on daily prednisone, chronically elevated LFTs, is here for evaluation of congestion in his nose and chest.  Onset 5 days ago, gradually worsening.  Associated with shortness of breath, back pain.  Patient uses 2 L nasal cannula supplemental oxygen at home 90% of the time but arrives without any oxygen to the ER.  He is febrile in triage, tachypneic with noted increased work of breathing.  States he may have missed 2 days of Lasix 1 week ago.  He denies any sore throat, chest pain, abdominal pain, nausea, vomiting, diarrhea, dysuria.  Urinary frequency contributed to Lasix.  Patient was discharged from hospital 1 week ago.  HPI  Past Medical History:  Diagnosis Date  . Arrhythmia    1995  . Atrial fibrillation ([place])   . CHF (congestive heart failure) ([place]) 10/13/2016  . Coronary artery disease    MI, atherectomy 1993  . DDD (degenerative disc disease)   . Eczema   . Elevated LFTs   . History of gallstones   . History of transient ischemic attack (TIA) 2005   double vision  . Hyperlipidemia   . Hypertension   . Pneumonia    long time ago  . Skin cancer    melanoma in remission  . Stroke [facility])     Patient Active Problem List   Diagnosis Date Noted  . Sepsis ([place]) 11/21/2018  . Aortic stenosis 11/12/2018  . AKI (acute kidney injury) ([place]) 11/12/2018  . Acute on chronic diastolic CHF (congestive heart failure) ([place]) 11/12/2018  . Abnormal breath sounds 08/02/2018  . Pleural effusion, bilateral 04/14/2017  . Open leg  wound, unspecified laterality, subsequent encounter 02/08/2017  . CHF (congestive heart failure) ([place]) 10/13/2016  . Encounter for well adult exam with abnormal findings 08/21/2016  . Hyperglycemia 08/21/2016  . Amaurosis fugax 07/08/2016  . Cerebrovascular accident (CVA) due to embolism of left middle cerebral artery ([place]) 03/21/2016  . Chronic anticoagulation 03/21/2016  . Ascending aortic aneurysm ([place]) 03/21/2016  . Stroke (cerebrum) ([place]) 02/01/2016  . Stroke due to embolism of left middle cerebral artery ([place]) 01/31/2016  . Varicose veins of right lower extremity with complications 79/12/4095  . CKD (chronic kidney disease) stage 3, GFR 30-59 ml/min (HCC) 10/31/2015  . Acute respiratory failure ([place]) 10/28/2015  . CAP (community acquired pneumonia) 10/28/2015  . Acute renal insufficiency 10/28/2015  . SIRS (systemic inflammatory response syndrome) ([place]) 10/28/2015  . Lower back pain 10/03/2015  . Dyspnea 10/03/2015  . Abnormal bruising 10/03/2015  . Varicose veins of leg with complications 35/32/9924  . DVT (deep venous thrombosis) ([place]) 06/07/2015  . Venous ulcers of both lower extremities ([place]) 05/22/2015  . AAA (abdominal aortic aneurysm) without rupture- 4.6 05/07/2015  . Chronic cutaneous venous stasis ulcer ([place]) 05/05/2015  . Autoimmune hepatitis treated with steroids ([place]) 05/05/2015  . 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 Melanoma of skin ([place]) 07/27/2013  . Osteomyelitis of toe of right foot 04/20/2013  . Current use of long term anticoagulation 03/04/2013  . PSA,  INCREASED 10/18/2009  . Coronary atherosclerosis 10/16/2008  . ECZEMA 10/16/2008  . HLD (hyperlipidemia) 09/02/2007  . Essential hypertension 09/02/2007  . MYOCARDIAL INFARCTION, HX OF 09/02/2007  . Atrial fibrillation ([place]) 09/02/2007    Past Surgical History:  Procedure Laterality Date  . AMPUTATION Right 05/10/2013   Procedure: RIGHT SECOND TOE AMPUTATION;  Surgeon: Hessie Dibble, MD;  Location: [facility];  Service: Orthopedics;  Laterality: Right;  . APPENDECTOMY    . CARDIAC CATHETERIZATION  04/08/99   LAD 70% INTER 40-50% RCA 30-40%  . CHEST TUBE INSERTION Right 02/16/2017   Procedure: INSERTION PLEURAL DRAINAGE CATHETER;  Surgeon: Grace Isaac, MD;  Location: [facility];  Service: Thoracic;  Laterality: Right;  . CORONARY ANGIOPLASTY  04/08/99   LAD  . CORONARY ANGIOPLASTY  10/17/97   RCA   . CORONARY ANGIOPLASTY WITH STENT PLACEMENT  04/08/99   LAD  . MELANOMA EXCISION  2013   lt arm  . MELANOMA EXCISION Left 08/23/2013   Procedure: MELANOMA EXCISION;  Surgeon: Pedro Earls, MD;  Location: [facility];  Service: General;  Laterality: Left;  . REMOVAL OF PLEURAL DRAINAGE CATHETER Right 05/07/2017   Procedure: REMOVAL OF PLEURAL DRAINAGE CATHETER;  Surgeon: Grace Isaac, MD;  Location: [facility];  Service: Thoracic;  Laterality: Right;  . TONSILLECTOMY    . VIDEO BRONCHOSCOPY Bilateral 05/07/2015   Procedure: VIDEO BRONCHOSCOPY WITHOUT FLUORO;  Surgeon: Rigoberto Noel, MD;  Location: [facility];  Service: Cardiopulmonary;  Laterality: Bilateral;        Home Medications    Prior to Admission medications   Medication Sig Start Date End Date Taking? Authorizing Provider  apixaban (ELIQUIS) 2.5 MG TABS tablet Take 1 tablet (2.5 mg total) by mouth 2 (two) times daily. 11/14/18  Yes Shelly Coss, MD  Ascorbic Acid (VITAMIN C PO) Take 1 tablet by mouth daily.   Yes [provider]  Cholecalciferol (VITAMIN D3) 5000 units CAPS Take 5,000 Units by mouth daily.   Yes [provider]  furosemide (LASIX) 20 MG tablet Take 1 tablet (20 mg total) by mouth every evening. 11/14/18  Yes Shelly Coss, MD  furosemide (LASIX) 40 MG tablet Take 1 tablet (40 mg total) by mouth every morning. 11/15/18  Yes Shelly Coss, MD  metoprolol tartrate (LOPRESSOR) 25 MG tablet Take 0.5 tablets (12.5 mg total) by mouth 2 (two) times  daily. 11/14/18  Yes Shelly Coss, MD  predniSONE (DELTASONE) 5 MG tablet Take 2 tablets (10 mg total) by mouth daily with breakfast. 07/29/18  Yes Irene Shipper, MD  VITAMIN A PO Take 1 capsule by mouth daily.   Yes [provider]    Family History Family History  Problem Relation Age of Onset  . Heart attack Mother 55       MI  . Pneumonia Father     Social History Social History   Tobacco Use  . Smoking status: Former Smoker    Years: 7.00    Types: Cigars, Pipe    Last attempt to quit: 11/18/1967    Years since quitting: 51.0  . Smokeless tobacco: Never Used  . Tobacco comment: 1-2 cigars a day, pipe  Substance Use Topics  . Alcohol use: Yes    Alcohol/week: 1.0 standard drinks    Types: 1 Glasses of wine per week    Comment: 2-3 glasses of wine in the evening  . Drug use: No     Allergies   Penicillins   Review  of Systems Review of Systems  Constitutional: Positive for fever.  HENT: Positive for congestion.   Respiratory: Positive for cough and shortness of breath.   Musculoskeletal: Positive for back pain.  Allergic/Immunologic: Positive for immunocompromised state (prednisone).  Hematological: Bruises/bleeds easily.  All other systems reviewed and are negative.    Physical Exam Updated Vital Signs BP 124/70   Pulse (!) 42   Temp (!) 102.2 F (39 C) (Rectal)   Resp 18   Ht 6' (1.829 m)   Wt 72 kg   SpO2 94%   BMI 21.53 kg/m   Physical Exam Vitals signs and nursing note reviewed.  Constitutional:      Appearance: He is well-developed. He is ill-appearing.     Comments: Elderly, frail male.  Increased work of breathing noted with transfer from chair to bed.  Looks tired.  HENT:     Head: Normocephalic and atraumatic.     Nose: Nose normal.  Eyes:     Conjunctiva/sclera: Conjunctivae normal.     Pupils: Pupils are equal, round, and reactive to light.  Neck:     Musculoskeletal: Normal range of motion.  Cardiovascular:     Rate  and Rhythm: Regular rhythm. Tachycardia present.     Heart sounds: Normal heart sounds.     Comments: 1+ radial and DP pulses bilaterally.  No lower extremity edema.  No calf tenderness. Pulmonary:     Effort: Tachypnea and respiratory distress present.     Breath sounds: Examination of the right-middle field reveals rhonchi. Examination of the left-middle field reveals rhonchi. Examination of the right-lower field reveals decreased breath sounds. Examination of the left-lower field reveals decreased breath sounds. Decreased breath sounds and rhonchi present.     Comments: Moderate respiratory distress.  Tachypneic.  SPO2 greater than 90% on 3.5 L nasal cannula.  Poor air entry to lower lobes.  And expiratory rhonchi bilaterally slightly worse on the left. Abdominal:     General: Bowel sounds are normal.     Palpations: Abdomen is soft.     Tenderness: There is no abdominal tenderness.     Comments: No G/R/R. No suprapubic or CVA tenderness. Negative Murphy's and McBurney's  Musculoskeletal: Normal range of motion.  Skin:    General: Skin is warm and dry.     Capillary Refill: Capillary refill takes less than 2 seconds.  Neurological:     Mental Status: He is alert and oriented to person, place, and time.  Psychiatric:        Behavior: Behavior normal.        Thought Content: Thought content normal.        Judgment: Judgment normal.      ED Treatments / Results  Labs (all labs ordered are listed, but only abnormal results are displayed) Labs Reviewed  COMPREHENSIVE METABOLIC PANEL - Abnormal; Notable for the following components:      Result Value   Chloride 95 (*)    Glucose, Bld 116 (*)    BUN 30 (*)    Creatinine, Ser 1.38 (*)    Albumin 2.2 (*)    AST 44 (*)    Alkaline Phosphatase 147 (*)    Total Bilirubin 2.1 (*)    GFR calc non Af Amer 44 (*)    GFR calc Af Amer 51 (*)    All other components within normal limits  CBC WITH DIFFERENTIAL/PLATELET - Abnormal; Notable  for the following components:   WBC 11.7 (*)    MCV 101.6 (*)  Neutro Abs 9.6 (*)    Monocytes Absolute 1.1 (*)    All other components within normal limits  PROTIME-INR - Abnormal; Notable for the following components:   Prothrombin Time 16.8 (*)    All other components within normal limits  I-STAT CG4 LACTIC ACID, ED - Abnormal; Notable for the following components:   Lactic Acid, Venous 2.38 (*)    All other components within normal limits  I-STAT ARTERIAL BLOOD GAS, ED - Abnormal; Notable for the following components:   pH, Arterial 7.491 (*)    pO2, Arterial 78.0 (*)    Bicarbonate 32.7 (*)    TCO2 34 (*)    Acid-Base Excess 9.0 (*)    All other components within normal limits  CULTURE, BLOOD (ROUTINE X 2)  CULTURE, BLOOD (ROUTINE X 2)  URINALYSIS, ROUTINE W REFLEX MICROSCOPIC  BRAIN NATRIURETIC PEPTIDE  INFLUENZA PANEL BY PCR (TYPE A & B)  I-STAT CG4 LACTIC ACID, ED    EKG EKG Interpretation  Date/Time:  Sunday November 21 2018 16:05:16 EST Ventricular Rate:  94 PR Interval:    QRS Duration: 98 QT Interval:  356 QTC Calculation: 445 R Axis:   -60 Text Interpretation:  Atrial fibrillation with premature ventricular or aberrantly conducted complexes Left anterior fascicular block Anterior infarct , age undetermined Abnormal ECG No significant change since last tracing Confirmed by Orlie Dakin [phone]) on 11/21/2018 4:10:22 PM   Radiology Dg Chest Portable 1 View  Result Date: 11/21/2018 CLINICAL DATA:  Cough, fever and sepsis. Recent hospital admission for CHF. EXAM: PORTABLE CHEST 1 VIEW COMPARISON:  Chest radiograph November 12, 2018 FINDINGS: Increased moderate to large RIGHT pleural effusion with underlying airspace opacities. Mild chronic interstitial changes. Stable cardiomegaly. Calcified aortic arch. No pneumothorax. Included soft tissue planes and osseous structures are unchanged. Stable solitary sclerotic lesion midthoracic spine. IMPRESSION: 1. Increased  moderate to large RIGHT pleural effusion with underlying airspace opacity. 2. Stable cardiomegaly. 3.  Aortic Atherosclerosis (ICD10-I70.0). Electronically Signed   By: Elon Alas M.D.   On: 11/21/2018 17:19    Procedures .Critical Care Performed by: Kinnie Feil, PA-C Authorized by: Kinnie Feil, PA-C   Critical care provider statement:    Critical care time (minutes):  45   Critical care was necessary to treat or prevent imminent or life-threatening deterioration of the following conditions:  Respiratory failure and sepsis   Critical care was time spent personally by me on the following activities:  Discussions with consultants, evaluation of patient's response to treatment, examination of patient, ordering and performing treatments and interventions, ordering and review of laboratory studies, ordering and review of radiographic studies, pulse oximetry, re-evaluation of patient's condition, obtaining history from patient or surrogate and review of old charts   I assumed direction of critical care for this patient from another provider in my specialty: no     (including critical care time)  Medications Ordered in ED Medications  vancomycin (VANCOCIN) IVPB 1000 mg/200 mL premix (has no administration in time range)  ceFEPIme (MAXIPIME) 2 g in sodium chloride 0.9 % 100 mL IVPB (0 g Intravenous Stopped 11/21/18 1732)  acetaminophen (TYLENOL) tablet 500 mg (500 mg Oral Given 11/21/18 1657)     Initial Impression / Assessment and Plan / ED Course  I have reviewed the triage vital signs and the nursing notes.  Pertinent labs & imaging results that were available during my care of the patient were reviewed by me and considered in my medical decision making (see chart for  details).  Clinical Course as of Nov 21 1742  Sun Nov 21, 2018  1702 pH, Arterial(!): 7.491 [CG]  1702 pO2, Arterial(!): 78.0 [CG]  1702 Bicarbonate(!): 32.7 [CG]  1702 TCO2(!): 34 [CG]  1702 Acid-Base  Excess(!): 9.0 [CG]  1704 Alkaline Phosphatase(!): 147 [CG]  1704 Total Bilirubin(!): 2.1 [CG]  1704 GFR, Est Non African American(!): 44 [CG]  1704 Creatinine(!): 1.38 [CG]  1727 IMPRESSION: 1. Increased moderate to large RIGHT pleural effusion with underlying airspace opacity. 2. Stable cardiomegaly. 3. Aortic Atherosclerosis (ICD10-I70.0).  DG Chest Portable 1 View [CG]    Clinical Course User Index [CG] Kinnie Feil, PA-C    Code sepsis activated given tachypnea, rectal fever.  Source likely pulmonary infection such as influenza versus pneumonia.  We will initiate healthcare acquired pneumonia antibiotics, will hold off on fluids given noted respiratory distress, history of heart failure although his EF was normal on his last echo.  Will wait for labs to determine needed amount of IV fluids.  Per last discharge note, patient is full code.  1740: Chest x-ray confirms right pneumonia with increased pleural effusion.  Lactic acid 2.38.  Minimal leukocytosis.  In setting of congestive heart failure, current respiratory distress, stable blood pressure we will hold off on large-volume IV fluids.  Upon reevaluation, patient's respiratory status has remained stable.  He is mentating well, tolerating 3.5 L [place].  ABG with PO2 78, bicarb 32.7, normal CO2, pH 7.49.  We will hold off on BiPAP pending reevaluation.  He does feel slightly more comfortable now.  Spoke to hospitalist who will admit patient acute respiratory failure, healthcare acquired pneumonia.  Pending influenza swab, BNP. Final Clinical Impressions(s) / ED Diagnoses   Final diagnoses:  HCAP (healthcare-associated pneumonia)  Acute respiratory failure with hypoxia [facility])    ED Discharge Orders    None       Arlean Hopping 11/21/18 1744    Lacretia Leigh, MD 11/21/18 2321

## 2018-11-21 NOTE — ED Notes (Signed)
BNP added to blood in main  lab. 

## 2018-11-22 ENCOUNTER — Inpatient Hospital Stay (HOSPITAL_COMMUNITY): Payer: Medicare Other

## 2018-11-22 ENCOUNTER — Ambulatory Visit: Payer: Medicare Other | Admitting: Nurse Practitioner

## 2018-11-22 ENCOUNTER — Inpatient Hospital Stay: Payer: Medicare Other | Admitting: Internal Medicine

## 2018-11-22 ENCOUNTER — Encounter (HOSPITAL_COMMUNITY): Payer: Self-pay | Admitting: Physician Assistant

## 2018-11-22 DIAGNOSIS — A408 Other streptococcal sepsis: Secondary | ICD-10-CM

## 2018-11-22 HISTORY — PX: IR THORACENTESIS ASP PLEURAL SPACE W/IMG GUIDE: IMG5380

## 2018-11-22 LAB — BASIC METABOLIC PANEL
Anion gap: 12 (ref 5–15)
BUN: 31 mg/dL — ABNORMAL HIGH (ref 8–23)
CHLORIDE: 94 mmol/L — AB (ref 98–111)
CO2: 29 mmol/L (ref 22–32)
Calcium: 8.2 mg/dL — ABNORMAL LOW (ref 8.9–10.3)
Creatinine, Ser: 1.18 mg/dL (ref 0.61–1.24)
GFR calc non Af Amer: 54 mL/min — ABNORMAL LOW (ref >60)
Glucose, Bld: 147 mg/dL — ABNORMAL HIGH (ref 70–99)
Potassium: 3.8 mmol/L (ref 3.5–5.1)
Sodium: 135 mmol/L (ref 135–145)

## 2018-11-22 LAB — STREP PNEUMONIAE URINARY ANTIGEN: Strep Pneumo Urinary Antigen: POSITIVE — AB

## 2018-11-22 LAB — CBC
HCT: 38.9 % — ABNORMAL LOW (ref 39.0–52.0)
Hemoglobin: 12.2 g/dL — ABNORMAL LOW (ref 13.0–17.0)
MCH: 31.6 pg (ref 26.0–34.0)
MCHC: 31.4 g/dL (ref 30.0–36.0)
MCV: 100.8 fL — ABNORMAL HIGH (ref 80.0–100.0)
NRBC: 0 % (ref 0.0–0.2)
Platelets: 182 10*3/uL (ref 150–400)
RBC: 3.86 MIL/uL — AB (ref 4.22–5.81)
RDW: 14.8 % (ref 11.5–15.5)
WBC: 11.6 10*3/uL — ABNORMAL HIGH (ref 4.0–10.5)

## 2018-11-22 LAB — HIV ANTIBODY (ROUTINE TESTING W REFLEX): HIV Screen 4th Generation wRfx: NONREACTIVE

## 2018-11-22 LAB — LACTATE DEHYDROGENASE: LDH: 167 U/L (ref 98–192)

## 2018-11-22 LAB — PROTEIN, TOTAL: Total Protein: 6.6 g/dL (ref 6.5–8.1)

## 2018-11-22 MED ORDER — LIDOCAINE HCL (PF) 1 % IJ SOLN
INTRAMUSCULAR | Status: AC | PRN
  Administered 2018-11-22: 10 mL

## 2018-11-22 MED ORDER — LIDOCAINE HCL 1 % IJ SOLN
INTRAMUSCULAR | Status: AC
  Filled 2018-11-22: qty 20

## 2018-11-22 MED ORDER — SODIUM CHLORIDE 0.9 % IV SOLN
2.0000 g | INTRAVENOUS | Status: DC
  Administered 2018-11-22 – 2018-11-24 (×3): 2 g via INTRAVENOUS
  Filled 2018-11-22 (×3): qty 20

## 2018-11-22 MED ORDER — LEVALBUTEROL HCL 0.63 MG/3ML IN NEBU
0.6300 mg | INHALATION_SOLUTION | Freq: Four times a day (QID) | RESPIRATORY_TRACT | Status: DC | PRN
  Administered 2018-11-23 – 2018-11-24 (×3): 0.63 mg via RESPIRATORY_TRACT
  Filled 2018-11-22 (×3): qty 3

## 2018-11-22 NOTE — Progress Notes (Signed)
PT Cancellation Note  Patient Details Name: KRISTA SOM MRN: [id number] DOB: 02/01/1925   Cancelled Treatment:    Reason Eval/Treat Not Completed: Patient at procedure or test/unavailable Will follow-up as time allows.   Lanney Gins, PT, DPT Supplemental Physical Therapist 11/22/18 9:06 AM Pager: [phone] Office: [phone]

## 2018-11-22 NOTE — Progress Notes (Signed)
Patient is positive for Strep Pneumo Urine Antigen, reported to [facility].

## 2018-11-22 NOTE — Progress Notes (Signed)
Patient coughing up blood frequently. MD notified.

## 2018-11-22 NOTE — Evaluation (Signed)
Physical Therapy Evaluation Patient Details Name: Arthur [name] MRN: [id number] DOB: 1924-12-03 Today's Date: 11/22/2018   History of Present Illness  Patient is a 85 y/o male presenting to the ED on  11/21/17 with primary complaints of chest congestion, fever, shortness of breath. Patient was found to have right lower lung pneumonia with the large pleural effusion, hypoxic. Past history of diastolic CHF, atrial fibrillation on Eliquis, hypertension, who was recently discharged on 11/14/2018 been admitted for acute on chronic diastolic CHF.    Clinical Impression  Mr. Pyon is a very pleasant 86 y/o male admitted with the above listed diagnosis. Patient reports Mod I with mobility prior to admission - did use a char/stiar lift to navigate stairs in home without issue. Patient today general min guard for all mobility. Gait limited due to patient request to eat and multiple team members in room. Will recommend HHPT + 24hr/supervision initially at discharge. PT to continue to follow.     Follow Up Recommendations Home health PT;Supervision/Assistance - 24 hour    Equipment Recommendations  None recommended by PT    Recommendations for Other Services       Precautions / Restrictions Precautions Precautions: Fall Restrictions Weight Bearing Restrictions: No      Mobility  Bed Mobility Overal bed mobility: Modified Independent             General bed mobility comments: some increased time  Transfers Overall transfer level: Needs assistance Equipment used: None Transfers: Sit to/from Stand;Stand Pivot Transfers Sit to Stand: Min guard Stand pivot transfers: Min guard       General transfer comment: for safety and immediate standing balance  Ambulation/Gait Ambulation/Gait assistance: Min guard Gait Distance (Feet): 5 Feet Assistive device: None Gait Pattern/deviations: Step-through pattern;Trunk flexed Gait velocity: decreased   General Gait Details: short distance to  chair; patient had been out of his room all day for procedures - was only interested in sitting up to eat breakfast; nursing and lab staff then present in room; will continue to mobilize  Stairs            Wheelchair Mobility    Modified Rankin (Stroke Patients Only)       Balance Overall balance assessment: Needs assistance Sitting-balance support: No upper extremity supported;Feet supported Sitting balance-Leahy Scale: Good     Standing balance support: No upper extremity supported;During functional activity Standing balance-Leahy Scale: Fair                               Pertinent Vitals/Pain Pain Assessment: No/denies pain    Home Living Family/patient expects to be discharged to:: Private residence Living Arrangements: Spouse/significant other Available Help at Discharge: Family;Available 24 hours/day(lives with wife; has daughter close by) Type of Home: House Home Access: Level entry     Home Layout: Two level Home Equipment: Walker - 2 wheels;Cane - single point;Grab bars - toilet;Grab bars - tub/shower      Prior Function Level of Independence: Independent         Comments: reports he was Mod I with mobility without AD     Hand Dominance        Extremity/Trunk Assessment   Upper Extremity Assessment Upper Extremity Assessment: Defer to OT evaluation    Lower Extremity Assessment Lower Extremity Assessment: Generalized weakness    Cervical / Trunk Assessment Cervical / Trunk Assessment: Kyphotic  Communication   Communication: HOH  Cognition Arousal/Alertness: Awake/alert Behavior During  Therapy: WFL for tasks assessed/performed Overall Cognitive Status: Within Functional Limits for tasks assessed                                        General Comments General comments (skin integrity, edema, etc.): dressing to R elbow - patient reports frail skin    Exercises     Assessment/Plan    PT Assessment  Patient needs continued PT services  PT Problem List Decreased strength;Decreased activity tolerance;Decreased balance;Decreased mobility;Decreased knowledge of use of DME;Decreased safety awareness       PT Treatment Interventions DME instruction;Gait training;Stair training;Functional mobility training;Therapeutic activities;Therapeutic exercise;Balance training;Neuromuscular re-education;Patient/family education    PT Goals (Current goals can be found in the Care Plan section)  Acute Rehab PT Goals Patient Stated Goal: return home PT Goal Formulation: With patient Time For Goal Achievement: 12/06/18 Potential to Achieve Goals: Good    Frequency Min 3X/week   Barriers to discharge        Co-evaluation               AM-PAC PT "6 Clicks" Mobility  Outcome Measure Help needed turning from your back to your side while in a flat bed without using bedrails?: None Help needed moving from lying on your back to sitting on the side of a flat bed without using bedrails?: A Little Help needed moving to and from a bed to a chair (including a wheelchair)?: A Little Help needed standing up from a chair using your arms (e.g., wheelchair or bedside chair)?: A Little Help needed to walk in hospital room?: A Little Help needed climbing 3-5 steps with a railing? : A Lot 6 Click Score: 18    End of Session Equipment Utilized During Treatment: Gait belt Activity Tolerance: Patient tolerated treatment well Patient left: in chair;with call bell/phone within reach Nurse Communication: Mobility status PT Visit Diagnosis: Unsteadiness on feet (R26.81);Other abnormalities of gait and mobility (R26.89);Muscle weakness (generalized) (M62.81)    Time: 5400-8676 PT Time Calculation (min) (ACUTE ONLY): 15 min   Charges:   PT Evaluation $PT Eval Moderate Complexity: 1 Mod        Lanney Gins, PT, DPT Supplemental Physical Therapist 11/22/18 12:15 PM Pager: [phone] Office:  [phone]

## 2018-11-22 NOTE — Progress Notes (Signed)
PROGRESS NOTE                                                                                                                                                                                                             Patient Demographics:    Arthur [name], is a 85 y.o. male, DOB - [birth year], UUE:[id number]  Admit date - 11/21/2018   Admitting Physician Ripudeep Krystal Eaton, MD  Outpatient Primary MD for the patient is Biagio Borg, MD  LOS - 1   Chief Complaint  Patient presents with  . Shortness of Breath       Brief Narrative     85 year old male with history of diastolic CHF, atrial fibrillation on Eliquis, hypertension, who was recently discharged on 11/14/2018  for acute on chronic diastolic CHF.  Patient presents with hypoxia and sepsis secondary to pneumonia and pleural effusion, admitted for further work-up.   Subjective:    Arthur [name] today reports some dyspnea, cough, nonproductive, reports generalized weakness, not at baseline.    Assessment  & Plan :    Principal Problem:   Sepsis ([place]) Active Problems:   HLD (hyperlipidemia)   Essential hypertension   Atrial fibrillation (HCC)   Acute respiratory failure (HCC)   CKD (chronic kidney disease) stage 3, GFR 30-59 ml/min (HCC)   CHF (congestive heart failure) (HCC)    Sepsis with acute respiratory failure with hypoxia, secondary to moderate to large right-sided pleural effusion with HCAP -Patient met sepsis criteria at the time of admission with fever, leukocytosis, tachypnea, hypoxia source likely secondary to pneumonia -Cultures and sputum cultures are pending . -Pneumonia antigen is positive  -Initiate incentive spirometry, flutter valve . -Continue with IV vancomycin and cefepime  Pleural effusion -Right-sided, unsuccessful thoracentesis by IR today, likely related, will obtain CT chest for further evaluation    Essential hypertension -Pressure remains soft, I will  continue to hold medication   Chronic atrial fibrillation (HCC) -Heart rate remains controlled, continue to hold metoprolol for now -Continue with Eliquis   CKD (chronic kidney disease) stage 3, GFR 30-59 ml/min (HCC) -Baseline creatinine 1.2-1.3, creatinine currently at baseline  Chronic diastolic CHF (congestive heart failure) ([place]) -Needs to be a euvolemic, continue to hold Lasix in the setting of sepsis   History of autoimmune hepatitis -Continue daily prednisone 10 mg daily   Code  Status : Full  Family Communication  : None at bedside  Disposition Plan  : pending further work up  Consults  :  None  Procedures  : None  DVT Prophylaxis  :  On Eliquis  Lab Results  Component Value Date   PLT 182 11/22/2018    Antibiotics  :    Anti-infectives (From admission, onward)   Start     Dose/Rate Route Frequency Ordered Stop   11/22/18 0800  vancomycin (VANCOCIN) IVPB 1000 mg/200 mL premix  Status:  Discontinued     1,000 mg 200 mL/hr over 60 Minutes Intravenous Every 12 hours 11/21/18 1836 11/21/18 1839   11/22/18 0800  vancomycin (VANCOCIN) IVPB 1000 mg/200 mL premix     1,000 mg 200 mL/hr over 60 Minutes Intravenous Every 12 hours 11/21/18 1839 11/30/18 0759   11/22/18 0100  ceFEPIme (MAXIPIME) 1 g in sodium chloride 0.9 % 100 mL IVPB     1 g 200 mL/hr over 30 Minutes Intravenous Every 8 hours 11/21/18 1920 11/30/18 0059   11/21/18 1900  vancomycin (VANCOCIN) 1,500 mg in sodium chloride 0.9 % 500 mL IVPB     1,500 mg 250 mL/hr over 120 Minutes Intravenous  Once 11/21/18 1833 11/21/18 2131   11/21/18 1645  vancomycin (VANCOCIN) IVPB 1000 mg/200 mL premix  Status:  Discontinued     1,000 mg 200 mL/hr over 60 Minutes Intravenous  Once 11/21/18 1639 11/21/18 1833   11/21/18 1645  ceFEPIme (MAXIPIME) 2 g in sodium chloride 0.9 % 100 mL IVPB     2 g 200 mL/hr over 30 Minutes Intravenous  Once 11/21/18 1639 11/21/18 1732        Objective:   Vitals:   11/22/18  0745 11/22/18 0816 11/22/18 0836 11/22/18 1230  BP:   114/66   Pulse:   87   Resp:      Temp:  99.7 F (37.6 C) 97.6 F (36.4 C) 97.8 F (36.6 C)  TempSrc:  Oral Oral Oral  SpO2: 96%  93%   Weight:      Height:        Wt Readings from Last 3 Encounters:  11/21/18 72 kg  11/14/18 71.8 kg  10/04/18 74.9 kg    No intake or output data in the 24 hours ending 11/22/18 1245   Physical Exam  Awake Alert, Oriented X 3, No new F.N deficits, Normal affect Symmetrical Chest wall movement, air entry in the right side, no wheezing RRR,No Gallops,Rubs or new Murmurs, No Parasternal Heave +ve B.Sounds, Abd Soft, No tenderness, No rebound - guarding or rigidity. No Cyanosis, Clubbing or edema, No new Rash or bruise      Data Review:    CBC Recent Labs  Lab 11/21/18 1619 11/22/18 0321  WBC 11.7* 11.6*  HGB 14.3 12.2*  HCT 44.7 38.9*  PLT 259 182  MCV 101.6* 100.8*  MCH 32.5 31.6  MCHC 32.0 31.4  RDW 14.8 14.8  LYMPHSABS 0.9  --   MONOABS 1.1*  --   EOSABS 0.0  --   BASOSABS 0.0  --     Chemistries  Recent Labs  Lab 11/19/18 1343 11/21/18 1619 11/22/18 0321  NA 139 135 135  K 4.7 4.3 3.8  CL 93* 95* 94*  CO2 27 29 29   GLUCOSE 198* 116* 147*  BUN 35 30* 31*  CREATININE 1.26 1.38* 1.18  CALCIUM 8.9 9.1 8.2*  AST  --  44*  --   ALT  --  34  --  ALKPHOS  --  147*  --   BILITOT  --  2.1*  --    ------------------------------------------------------------------------------------------------------------------ No results for input(s): CHOL, HDL, LDLCALC, TRIG, CHOLHDL, LDLDIRECT in the last 72 hours.  Lab Results  Component Value Date   HGBA1C 5.9 (H) 02/01/2016   ------------------------------------------------------------------------------------------------------------------ No results for input(s): TSH, T4TOTAL, T3FREE, THYROIDAB in the last 72 hours.  Invalid input(s):  FREET3 ------------------------------------------------------------------------------------------------------------------ No results for input(s): VITAMINB12, FOLATE, FERRITIN, TIBC, IRON, RETICCTPCT in the last 72 hours.  Coagulation profile Recent Labs  Lab 11/21/18 1619  INR 1.38    No results for input(s): DDIMER in the last 72 hours.  Cardiac Enzymes No results for input(s): CKMB, TROPONINI, MYOGLOBIN in the last 168 hours.  Invalid input(s): CK ------------------------------------------------------------------------------------------------------------------    Component Value Date/Time   BNP 902.1 (H) 11/21/2018 1619   BNP 360.9 (H) 07/02/2016 1456    Inpatient Medications  Scheduled Meds: . apixaban  2.5 mg Oral BID  . benzonatate  100 mg Oral TID  . cholecalciferol  5,000 Units Oral Daily  . guaiFENesin  1,200 mg Oral BID  . levalbuterol  0.63 mg Nebulization Q6H  . lidocaine      . predniSONE  10 mg Oral Q breakfast   Continuous Infusions: . ceFEPime (MAXIPIME) IV 1 g (11/22/18 1029)  . vancomycin 1,000 mg (11/22/18 1146)   PRN Meds:.  Micro Results No results found for this or any previous visit (from the past 240 hour(s)).  Radiology Reports Dg Chest 1 View  Result Date: 11/22/2018 CLINICAL DATA:  Failed right thoracentesis EXAM: CHEST  1 VIEW COMPARISON:  11/21/2018 FINDINGS: Pleural thickening/loculated fluid in the right base and along the right lateral pleural space is unchanged. No pneumothorax post attempted thoracentesis. Minimal left effusion.  Right lower lobe consolidation unchanged. Cardiac enlargement without edema.  Atherosclerotic aortic arch. IMPRESSION: Pleural thickening/loculated fluid on the right unchanged. No pneumothorax. Electronically Signed   By: Franchot Gallo M.D.   On: 11/22/2018 10:16   Dg Chest Right Decubitus  Result Date: 11/21/2018 CLINICAL DATA:  Evaluate right pleural effusion. EXAM: CHEST - RIGHT DECUBITUS COMPARISON:   11/21/2018 at 1701 hours FINDINGS: Right lower the cubitus views show layering of the right pleural effusion. There is hazy opacity underlying lung which may reflect atelectasis, edema or possibly infection. IMPRESSION: Moderate free-flowing right pleural effusion. Electronically Signed   By: Lajean Manes M.D.   On: 11/21/2018 19:08   Dg Chest Portable 1 View  Result Date: 11/21/2018 CLINICAL DATA:  Cough, fever and sepsis. Recent hospital admission for CHF. EXAM: PORTABLE CHEST 1 VIEW COMPARISON:  Chest radiograph November 12, 2018 FINDINGS: Increased moderate to large RIGHT pleural effusion with underlying airspace opacities. Mild chronic interstitial changes. Stable cardiomegaly. Calcified aortic arch. No pneumothorax. Included soft tissue planes and osseous structures are unchanged. Stable solitary sclerotic lesion midthoracic spine. IMPRESSION: 1. Increased moderate to large RIGHT pleural effusion with underlying airspace opacity. 2. Stable cardiomegaly. 3.  Aortic Atherosclerosis (ICD10-I70.0). Electronically Signed   By: Elon Alas M.D.   On: 11/21/2018 17:19   Dg Chest Port 1 View  Result Date: 11/12/2018 CLINICAL DATA:  Short of breath EXAM: PORTABLE CHEST 1 VIEW COMPARISON:  08/02/2018 FINDINGS: The heart remains moderately enlarged. Dense opacity at the right base has increased likely combination of pleural fluid and consolidation. Stable small left pleural effusion. Stable vascular congestion. No pneumothorax. Stable deformity of the peripheral right clavicle. IMPRESSION: Increasing opacity at the right lung base likely a combination of  pleural fluid and consolidation. Electronically Signed   By: Marybelle Killings M.D.   On: 11/12/2018 16:49   Ir Thoracentesis Asp Pleural Space W/img Guide  Result Date: 11/22/2018 CLINICAL DATA:  Increasing right lateral pleural opacity with suggestion of free-flowing component on decubitus radiograph. EXAM: EXAM THORACENTESIS WITH ULTRASOUND TECHNIQUE: The  procedure, risks (including but not limited to bleeding, infection, organ damage ), benefits, and alternatives were explained to the patient. Questions regarding the procedure were encouraged and answered. The patient understands and consents to the procedure. Survey ultrasound of the right hemithorax was performed and an appropriate skin entry site was localized. Site was marked, prepped with Betadine, draped in usual sterile fashion, infiltrated locally with 1% lidocaine. The Safe-T-Centesis needle was advanced into the pleural space. No significant fluid returned. Under ultrasound guidance, the pocket was again localized under real-time ultrasound guidance, the catheter advanced again into the pleural process, but no fluid could be aspirated. The catheter was removed. The patient tolerated the procedure well. COMPLICATIONS: COMPLICATIONS None immediate IMPRESSION: 1. Attempted ultrasound-guided right thoracentesis, with no return of fluid after 2 attempts. Consider CT chest for further characterization of the pleural process. Electronically Signed   By: Lucrezia Europe M.D.   On: 11/22/2018 10:50     Phillips Climes M.D on 11/22/2018 at 12:45 PM  Between 7am to 7pm - Pager - [phone]  After 7pm go to [email or web address] - password [email or web address]  [facility] -  Office  [phone]

## 2018-11-22 NOTE — Progress Notes (Signed)
Pharmacy Antibiotic Note  Arthur [name] is a 85 y.o. male admitted on 11/21/2018 with pneumonia.  His urinary strep came back positive. D/w with Dr. Lucrezia Europe, we will narrow to ceftriaxone.  Plan: Dc vanc/cefepime Ceftriaxone 2g IV q24  Height: 6' (182.9 cm) Weight: 158 lb 11.7 oz (72 kg) IBW/kg (Calculated) : 77.6  Temp (24hrs), Avg:99.1 F (37.3 C), Min:97.6 F (36.4 C), Max:102.2 F (39 C)  Recent Labs  Lab 11/19/18 1343 11/21/18 1619 11/21/18 1626 11/21/18 1820 11/22/18 0321  WBC  --  11.7*  --   --  11.6*  CREATININE 1.26 1.38*  --   --  1.18  LATICACIDVEN  --   --  2.38* 1.44  --     Estimated Creatinine Clearance: 41.5 mL/min (by C-G formula based on SCr of 1.18 mg/dL).    Allergies  Allergen Reactions  . Penicillins Hives    Has patient had a PCN reaction causing immediate rash, facial/tongue/throat swelling, SOB or lightheadedness with hypotension:Yes Has patient had a PCN reaction causing severe rash involving mucus membranes or skin necrosis: No Has patient had a PCN reaction that required hospitalization: No Has patient had a PCN reaction occurring within the last 10 years: No If all of the above answers are "NO", then may proceed with Cephalosporin use.     Antimicrobials this admission: Vanc 1/5 >> 1/6 Cefepime 1/5 >> 1/6 Ceftriaxone 1/6>>  Urinary strep +  Onnie Boer, PharmD, BCIDP, AAHIVP, CPP Infectious Disease Pharmacist 11/22/2018 2:39 PM

## 2018-11-22 NOTE — Procedures (Signed)
PROCEDURE SUMMARY:  Attempted image-guided right thoracentesis x 2 - no fluid could be withdrawn from the pleural space by myself or Dr. Vernard Gambles. Fluid appears complex on ultrasound, consider CT for further evaluation if indicated.  Patient tolerated procedure well. EBL: <5 mL No immediate complications. . Post attempted procedure CXR shows no pneumothorax.  Please see imaging section of Epic for full dictation.  Joaquim Nam PA-C 11/22/2018 9:34 AM

## 2018-11-23 DIAGNOSIS — J90 Pleural effusion, not elsewhere classified: Secondary | ICD-10-CM

## 2018-11-23 LAB — CBC
HCT: 39.5 % (ref 39.0–52.0)
Hemoglobin: 12.4 g/dL — ABNORMAL LOW (ref 13.0–17.0)
MCH: 31.4 pg (ref 26.0–34.0)
MCHC: 31.4 g/dL (ref 30.0–36.0)
MCV: 100 fL (ref 80.0–100.0)
PLATELETS: 223 10*3/uL (ref 150–400)
RBC: 3.95 MIL/uL — ABNORMAL LOW (ref 4.22–5.81)
RDW: 14.6 % (ref 11.5–15.5)
WBC: 12.8 10*3/uL — ABNORMAL HIGH (ref 4.0–10.5)
nRBC: 0 % (ref 0.0–0.2)

## 2018-11-23 LAB — COMPREHENSIVE METABOLIC PANEL
ALT: 28 U/L (ref 0–44)
AST: 34 U/L (ref 15–41)
Albumin: 1.9 g/dL — ABNORMAL LOW (ref 3.5–5.0)
Alkaline Phosphatase: 120 U/L (ref 38–126)
Anion gap: 8 (ref 5–15)
BUN: 34 mg/dL — ABNORMAL HIGH (ref 8–23)
CO2: 31 mmol/L (ref 22–32)
Calcium: 8.8 mg/dL — ABNORMAL LOW (ref 8.9–10.3)
Chloride: 97 mmol/L — ABNORMAL LOW (ref 98–111)
Creatinine, Ser: 1.34 mg/dL — ABNORMAL HIGH (ref 0.61–1.24)
GFR calc Af Amer: 53 mL/min — ABNORMAL LOW (ref >60)
GFR calc non Af Amer: 46 mL/min — ABNORMAL LOW (ref >60)
Glucose, Bld: 103 mg/dL — ABNORMAL HIGH (ref 70–99)
Potassium: 3.9 mmol/L (ref 3.5–5.1)
Sodium: 136 mmol/L (ref 135–145)
Total Bilirubin: 1.2 mg/dL (ref 0.3–1.2)
Total Protein: 6.4 g/dL — ABNORMAL LOW (ref 6.5–8.1)

## 2018-11-23 LAB — LEGIONELLA PNEUMOPHILA SEROGP 1 UR AG: L. pneumophila Serogp 1 Ur Ag: NEGATIVE

## 2018-11-23 MED ORDER — APIXABAN 2.5 MG PO TABS
2.5000 mg | ORAL_TABLET | Freq: Two times a day (BID) | ORAL | Status: DC
  Administered 2018-11-23 – 2018-11-25 (×5): 2.5 mg via ORAL
  Filled 2018-11-23 (×5): qty 1

## 2018-11-23 MED ORDER — ACETYLCYSTEINE 20 % IN SOLN
1.0000 mL | Freq: Four times a day (QID) | RESPIRATORY_TRACT | Status: DC
  Administered 2018-11-23: 1 mL via RESPIRATORY_TRACT
  Filled 2018-11-23 (×5): qty 4

## 2018-11-23 MED ORDER — VITAMIN C 500 MG PO TABS
250.0000 mg | ORAL_TABLET | Freq: Every day | ORAL | Status: DC
  Administered 2018-11-23 – 2018-11-25 (×3): 250 mg via ORAL
  Filled 2018-11-23 (×3): qty 1

## 2018-11-23 MED ORDER — METOPROLOL TARTRATE 12.5 MG HALF TABLET
12.5000 mg | ORAL_TABLET | Freq: Two times a day (BID) | ORAL | Status: DC
  Administered 2018-11-23 – 2018-11-25 (×5): 12.5 mg via ORAL
  Filled 2018-11-23 (×5): qty 1

## 2018-11-23 MED ORDER — TRAMADOL HCL 50 MG PO TABS
50.0000 mg | ORAL_TABLET | Freq: Once | ORAL | Status: AC
  Administered 2018-11-23: 50 mg via ORAL
  Filled 2018-11-23: qty 1

## 2018-11-23 NOTE — Progress Notes (Addendum)
PROGRESS NOTE                                                                                                                                                                                                             Patient Demographics:    Arthur [name], is a 85 y.o. male, DOB - [birth year], ELT:[id number]  Admit date - 11/21/2018   Admitting Physician Ripudeep Krystal Eaton, MD  Outpatient Primary MD for the patient is Biagio Borg, MD  LOS - 2   Chief Complaint  Patient presents with  . Shortness of Breath       Brief Narrative     85 year old male with history of diastolic CHF, atrial fibrillation on Eliquis, hypertension, who was recently discharged on 11/14/2018  for acute on chronic diastolic CHF.  Patient presents with hypoxia and sepsis secondary to pneumonia and pleural effusion, admitted for further work-up, apparently IR was not able to perform thoracentesis, CT chest confirms likely due to pleural effusion, chronic, with some postobstructive atelectasis of the right lower lobe due to mucus inspissation within lower lobe bronchi and  new focus of rounded atelectasis in the left lower lobe, and had some intermittent hemoptysis, for which his Eliquis has been held temporarily.   Subjective:    Arthur [name] today reports some dyspnea, cough,he does report hemoptysis over night, much less today, nonproductive, reports generalized weakness, not at baseline.    Assessment  & Plan :    Principal Problem:   Sepsis ([place]) Active Problems:   HLD (hyperlipidemia)   Essential hypertension   Atrial fibrillation (HCC)   Acute respiratory failure (HCC)   CKD (chronic kidney disease) stage 3, GFR 30-59 ml/min (HCC)   CHF (congestive heart failure) (HCC)    Sepsis with acute respiratory failure with hypoxia, secondary to loculated pleural effusion, with surrounding atelectasis and mucous plugging. -Patient met sepsis criteria at the time of admission  with fever, leukocytosis, tachypnea, hypoxia source likely secondary to pneumonia -Culture and sputum cultures so far with no growth to date, his strep pneumonia antigen is positive, so antibiotic regimen has been narrowed to Rocephin  Loculated pleural effusion -Right-sided, unsuccessful thoracentesis by IR, repeat  CT chest showing worsening loculated pleural effusion, with surrounding atelectasis and . -Patient to use chest PT, flutter valve, incentive spirometry, started on Mucomyst nebs . -I  have discussed with Dr. Elsworth Soho from pulmonary, his age and facility would rather try conservative management first before considering attention, recommendation for an antibiotic on discharge 2 to 3 weeks, and follow-up on Monday as an outpatient before antibiotic course finishes, for repeat imaging and further evaluation.(appointment  made with Dr. Elsworth Soho 12/02/2018 at 2:30 PM)  Essential hypertension -Pressure started to increase, resume on metoprolol  Chronic atrial fibrillation ([place]) -Heart rate remains controlled, now he is back on metoprolol, Eliquis has been held overnight for hemoptysis, significantly improved, he is back on Eliquis(baseline dose is 2.5 mg which is lower than recommended days, but I think this would be appropriate in the setting of his hemoptysis)  Hemoptysis -This is likely continue to his pneumonia in the setting of anticoagulation, significantly improved, back on Eliquis   CKD (chronic kidney disease) stage 3, GFR 30-59 ml/min (HCC) -Baseline creatinine 1.2-1.3, creatinine currently at baseline  Chronic diastolic CHF (congestive heart failure) ([place]) -Needs to be a euvolemic, continue to hold Lasix in the setting of sepsis   History of autoimmune hepatitis -Continue daily prednisone 10 mg daily  Stable aneurysmal dilatation of the ascending thoracic aorta to 4.6 x 4.7 cm. - radiology  Recommend semi-annual imaging followup by CTA or MRA and referral to cardiothoracic  surgery if not already obtained.   Code Status : Full  Family Communication  : daughter at bedside    Disposition Plan  : pending further work up  Consults  :  None  Procedures  : None  DVT Prophylaxis  :  On Eliquis  Lab Results  Component Value Date   PLT 223 11/23/2018    Antibiotics  :    Anti-infectives (From admission, onward)   Start     Dose/Rate Route Frequency Ordered Stop   11/22/18 2000  cefTRIAXone (ROCEPHIN) 2 g in sodium chloride 0.9 % 100 mL IVPB     2 g 200 mL/hr over 30 Minutes Intravenous Every 24 hours 11/22/18 1433     11/22/18 0800  vancomycin (VANCOCIN) IVPB 1000 mg/200 mL premix  Status:  Discontinued     1,000 mg 200 mL/hr over 60 Minutes Intravenous Every 12 hours 11/21/18 1836 11/21/18 1839   11/22/18 0800  vancomycin (VANCOCIN) IVPB 1000 mg/200 mL premix  Status:  Discontinued     1,000 mg 200 mL/hr over 60 Minutes Intravenous Every 12 hours 11/21/18 1839 11/22/18 1433   11/22/18 0100  ceFEPIme (MAXIPIME) 1 g in sodium chloride 0.9 % 100 mL IVPB  Status:  Discontinued     1 g 200 mL/hr over 30 Minutes Intravenous Every 8 hours 11/21/18 1920 11/22/18 1433   11/21/18 1900  vancomycin (VANCOCIN) 1,500 mg in sodium chloride 0.9 % 500 mL IVPB     1,500 mg 250 mL/hr over 120 Minutes Intravenous  Once 11/21/18 1833 11/21/18 2131   11/21/18 1645  vancomycin (VANCOCIN) IVPB 1000 mg/200 mL premix  Status:  Discontinued     1,000 mg 200 mL/hr over 60 Minutes Intravenous  Once 11/21/18 1639 11/21/18 1833   11/21/18 1645  ceFEPIme (MAXIPIME) 2 g in sodium chloride 0.9 % 100 mL IVPB     2 g 200 mL/hr over 30 Minutes Intravenous  Once 11/21/18 1639 11/21/18 1732        Objective:   Vitals:   11/23/18 0430 11/23/18 0439 11/23/18 0754 11/23/18 1248  BP:  (!) 142/86    Pulse:  87    Resp:  (!) 23    Temp: 97.7  F (36.5 C)  97.9 F (36.6 C) 97.6 F (36.4 C)  TempSrc: Oral  Oral Oral  SpO2:  95%    Weight:      Height:        Wt Readings from  Last 3 Encounters:  11/21/18 72 kg  11/14/18 71.8 kg  10/04/18 74.9 kg     Intake/Output Summary (Last 24 hours) at 11/23/2018 1256 Last data filed at 11/23/2018 0505 Gross per 24 hour  Intake 880 ml  Output -  Net 880 ml     Physical Exam  Awake Alert, Oriented X 3, No new F.N deficits, Normal affect Symmetrical Chest wall movement, Minister entry at the right lung base, with rails IRR IRR ,No Gallops,Rubs or new Murmurs, No Parasternal Heave +ve B.Sounds, Abd Soft, No tenderness, No rebound - guarding or rigidity. No Cyanosis, Clubbing or edema, No new Rash or bruise       Data Review:    CBC Recent Labs  Lab 11/21/18 1619 11/22/18 0321 11/23/18 0433  WBC 11.7* 11.6* 12.8*  HGB 14.3 12.2* 12.4*  HCT 44.7 38.9* 39.5  PLT 259 182 223  MCV 101.6* 100.8* 100.0  MCH 32.5 31.6 31.4  MCHC 32.0 31.4 31.4  RDW 14.8 14.8 14.6  LYMPHSABS 0.9  --   --   MONOABS 1.1*  --   --   EOSABS 0.0  --   --   BASOSABS 0.0  --   --     Chemistries  Recent Labs  Lab 11/19/18 1343 11/21/18 1619 11/22/18 0321 11/23/18 0433  NA 139 135 135 136  K 4.7 4.3 3.8 3.9  CL 93* 95* 94* 97*  CO2 _0 GLUCOSE 198* 116* 147* 103*  BUN 35 30* 31* 34*  CREATININE 1.26 1.38* 1.18 1.34*  CALCIUM 8.9 9.1 8.2* 8.8*  AST  --  44*  --  34  ALT  --  34  --  28  ALKPHOS  --  147*  --  120  BILITOT  --  2.1*  --  1.2   ------------------------------------------------------------------------------------------------------------------ No results for input(s): CHOL, HDL, LDLCALC, TRIG, CHOLHDL, LDLDIRECT in the last 72 hours.  Lab Results  Component Value Date   HGBA1C 5.9 (H) 02/01/2016   ------------------------------------------------------------------------------------------------------------------ No results for input(s): TSH, T4TOTAL, T3FREE, THYROIDAB in the last 72 hours.  Invalid input(s):  FREET3 ------------------------------------------------------------------------------------------------------------------ No results for input(s): VITAMINB12, FOLATE, FERRITIN, TIBC, IRON, RETICCTPCT in the last 72 hours.  Coagulation profile Recent Labs  Lab 11/21/18 1619  INR 1.38    No results for input(s): DDIMER in the last 72 hours.  Cardiac Enzymes No results for input(s): CKMB, TROPONINI, MYOGLOBIN in the last 168 hours.  Invalid input(s): CK ------------------------------------------------------------------------------------------------------------------    Component Value Date/Time   BNP 902.1 (H) 11/21/2018 1619   BNP 360.9 (H) 07/02/2016 1456    Inpatient Medications  Scheduled Meds: . acetylcysteine  1 mL Nebulization QID  . benzonatate  100 mg Oral TID  . cholecalciferol  5,000 Units Oral Daily  . guaiFENesin  1,200 mg Oral BID  . predniSONE  10 mg Oral Q breakfast   Continuous Infusions: . cefTRIAXone (ROCEPHIN)  IV Stopped (11/22/18 2103)   PRN Meds:.  Micro Results Recent Results (from the past 240 hour(s))  Culture, blood (Routine x 2)     Status: None (Preliminary result)   Collection Time: 11/21/18  4:13 PM  Result Value Ref Range Status   Specimen Description BLOOD BLOOD RIGHT FOREARM  Final   Special Requests   Final    BOTTLES DRAWN AEROBIC AND ANAEROBIC Blood Culture adequate volume   Culture   Final    NO GROWTH 2 DAYS Performed at [facility], [address], [place], [place] [address]    Report Status PENDING  Incomplete  Culture, blood (Routine x 2)     Status: None (Preliminary result)   Collection Time: 11/21/18  4:18 PM  Result Value Ref Range Status   Specimen Description BLOOD BLOOD LEFT FOREARM  Final   Special Requests   Final    BOTTLES DRAWN AEROBIC AND ANAEROBIC Blood Culture adequate volume   Culture   Final    NO GROWTH 2 DAYS Performed at [facility], [place] [address] [place], [place] [address]     Report Status PENDING  Incomplete    Radiology Reports Dg Chest 1 View  Result Date: 11/22/2018 CLINICAL DATA:  Failed right thoracentesis EXAM: CHEST  1 VIEW COMPARISON:  11/21/2018 FINDINGS: Pleural thickening/loculated fluid in the right base and along the right lateral pleural space is unchanged. No pneumothorax post attempted thoracentesis. Minimal left effusion.  Right lower lobe consolidation unchanged. Cardiac enlargement without edema.  Atherosclerotic aortic arch. IMPRESSION: Pleural thickening/loculated fluid on the right unchanged. No pneumothorax. Electronically Signed   By: Franchot Gallo M.D.   On: 11/22/2018 10:16   Ct Chest Wo Contrast  Result Date: 11/22/2018 CLINICAL DATA:  85 year old male with diastolic CHF and atrial fibrillation presents with hypoxia and sepsis. EXAM: CT CHEST WITHOUT CONTRAST TECHNIQUE: Multidetector CT imaging of the chest was performed following the standard protocol without IV contrast. COMPARISON:  08/09/2018 CT, PET CT 07/29/2013 FINDINGS: Cardiovascular: Moderate atherosclerosis of the great vessels and aorta. Aneurysmal dilatation of the ascending thoracic aorta to approximately 4.6 x 4.7 cm, series 3/78. This is unchanged and stable. Left main and three-vessel coronary arteriosclerosis is noted. There is cardiomegaly without pericardial effusion or thickening. Dilatation of the main pulmonary artery to 3.5 cm compatible with chronic pulmonary hypertension. Mediastinum/Nodes: Fat containing right paratracheal mildly enlarged lymph nodes possibly reactive. Assessment for hilar adenopathy is limited due to lack of IV contrast. The trachea and mainstem bronchi are patent. However there are soft tissue densities impacting the right lower lobe bronchi causing postobstructive atelectasis within the right lower lobe. The CT appearance of the esophagus is unremarkable. Lungs/Pleura: Chronic loculated right-sided pleural effusion increased in thickness since prior,  estimated up to 2.4 cm versus 0.8 cm at the same level previously. As stated there is postobstructive atelectasis from mucus inspissation within bronchi to the right lower lobe. Suspect a new focus of rounded atelectasis in the left lower lobe better visualized given interval decrease in size of the small left pleural effusion. Chronic biapical pleuroparenchymal scarring is noted. No dominant mass is seen. Upper Abdomen: Mild morphologic changes of cirrhosis with surface nodularity of the included liver. No splenomegaly or ascites. The included adrenal glands and upper poles both kidneys are nonacute. The gallbladder is excluded on this study. Musculoskeletal: Unchanged nonspecific sclerotic density of the T7 vertebral body dating back to prior PET-CT from 07/29/2013. No acute nor aggressive osseous lesions. IMPRESSION: 1. Stable aneurysmal dilatation of the ascending thoracic aorta to 4.6 x 4.7 cm. Recommend semi-annual imaging followup by CTA or MRA and referral to cardiothoracic surgery if not already obtained. This recommendation follows 2010 ACCF/AHA/AATS/ACR/ASA/SCA/SCAI/SIR/STS/SVM Guidelines for the Diagnosis and Management of Patients With Thoracic Aortic Disease. 2010; 121: E938-B017. 2. Chronic loculated right-sided  pleural effusion with postobstructive atelectasis of the right lower lobe due to mucus inspissation within lower lobe bronchi. The fluid loculation is more prominent on current exam measuring up to 2.4 cm thickness versus 0.8 cm at the same level previously within the right mid hemithorax. 3. Suspect a new focus of rounded atelectasis in the left lower lobe given interval decrease in size of the small left pleural effusion. 4. Stigmata of cirrhosis without splenomegaly or ascites. Aortic Atherosclerosis (ICD10-I70.0). Electronically Signed   By: Ashley Royalty M.D.   On: 11/22/2018 19:34   Dg Chest Right Decubitus  Result Date: 11/21/2018 CLINICAL DATA:  Evaluate right pleural effusion. EXAM:  CHEST - RIGHT DECUBITUS COMPARISON:  11/21/2018 at 1701 hours FINDINGS: Right lower the cubitus views show layering of the right pleural effusion. There is hazy opacity underlying lung which may reflect atelectasis, edema or possibly infection. IMPRESSION: Moderate free-flowing right pleural effusion. Electronically Signed   By: Lajean Manes M.D.   On: 11/21/2018 19:08   Dg Chest Portable 1 View  Result Date: 11/21/2018 CLINICAL DATA:  Cough, fever and sepsis. Recent hospital admission for CHF. EXAM: PORTABLE CHEST 1 VIEW COMPARISON:  Chest radiograph November 12, 2018 FINDINGS: Increased moderate to large RIGHT pleural effusion with underlying airspace opacities. Mild chronic interstitial changes. Stable cardiomegaly. Calcified aortic arch. No pneumothorax. Included soft tissue planes and osseous structures are unchanged. Stable solitary sclerotic lesion midthoracic spine. IMPRESSION: 1. Increased moderate to large RIGHT pleural effusion with underlying airspace opacity. 2. Stable cardiomegaly. 3.  Aortic Atherosclerosis (ICD10-I70.0). Electronically Signed   By: Elon Alas M.D.   On: 11/21/2018 17:19   Dg Chest Port 1 View  Result Date: 11/12/2018 CLINICAL DATA:  Short of breath EXAM: PORTABLE CHEST 1 VIEW COMPARISON:  08/02/2018 FINDINGS: The heart remains moderately enlarged. Dense opacity at the right base has increased likely combination of pleural fluid and consolidation. Stable small left pleural effusion. Stable vascular congestion. No pneumothorax. Stable deformity of the peripheral right clavicle. IMPRESSION: Increasing opacity at the right lung base likely a combination of pleural fluid and consolidation. Electronically Signed   By: Marybelle Killings M.D.   On: 11/12/2018 16:49   Ir Thoracentesis Asp Pleural Space W/img Guide  Result Date: 11/22/2018 CLINICAL DATA:  Increasing right lateral pleural opacity with suggestion of free-flowing component on decubitus radiograph. EXAM: EXAM  THORACENTESIS WITH ULTRASOUND TECHNIQUE: The procedure, risks (including but not limited to bleeding, infection, organ damage ), benefits, and alternatives were explained to the patient. Questions regarding the procedure were encouraged and answered. The patient understands and consents to the procedure. Survey ultrasound of the right hemithorax was performed and an appropriate skin entry site was localized. Site was marked, prepped with Betadine, draped in usual sterile fashion, infiltrated locally with 1% lidocaine. The Safe-T-Centesis needle was advanced into the pleural space. No significant fluid returned. Under ultrasound guidance, the pocket was again localized under real-time ultrasound guidance, the catheter advanced again into the pleural process, but no fluid could be aspirated. The catheter was removed. The patient tolerated the procedure well. COMPLICATIONS: COMPLICATIONS None immediate IMPRESSION: 1. Attempted ultrasound-guided right thoracentesis, with no return of fluid after 2 attempts. Consider CT chest for further characterization of the pleural process. Electronically Signed   By: Lucrezia Europe M.D.   On: 11/22/2018 10:50     Phillips Climes M.D on 11/23/2018 at 12:56 PM  Between 7am to 7pm - Pager - [phone]  After 7pm go to [email or web address] - password [email or web address]  [facility] -  Office  [phone]

## 2018-11-23 NOTE — Discharge Instructions (Signed)

## 2018-11-24 ENCOUNTER — Inpatient Hospital Stay (HOSPITAL_COMMUNITY): Payer: Medicare Other

## 2018-11-24 DIAGNOSIS — I10 Essential (primary) hypertension: Secondary | ICD-10-CM

## 2018-11-24 DIAGNOSIS — R209 Unspecified disturbances of skin sensation: Secondary | ICD-10-CM

## 2018-11-24 LAB — CBC
HCT: 40.1 % (ref 39.0–52.0)
HEMOGLOBIN: 12.8 g/dL — AB (ref 13.0–17.0)
MCH: 32.3 pg (ref 26.0–34.0)
MCHC: 31.9 g/dL (ref 30.0–36.0)
MCV: 101.3 fL — ABNORMAL HIGH (ref 80.0–100.0)
Platelets: 256 10*3/uL (ref 150–400)
RBC: 3.96 MIL/uL — ABNORMAL LOW (ref 4.22–5.81)
RDW: 14.7 % (ref 11.5–15.5)
WBC: 11.1 10*3/uL — ABNORMAL HIGH (ref 4.0–10.5)
nRBC: 0 % (ref 0.0–0.2)

## 2018-11-24 LAB — BASIC METABOLIC PANEL
Anion gap: 9 (ref 5–15)
BUN: 33 mg/dL — ABNORMAL HIGH (ref 8–23)
CO2: 32 mmol/L (ref 22–32)
Calcium: 8.9 mg/dL (ref 8.9–10.3)
Chloride: 95 mmol/L — ABNORMAL LOW (ref 98–111)
Creatinine, Ser: 1.39 mg/dL — ABNORMAL HIGH (ref 0.61–1.24)
GFR calc Af Amer: 51 mL/min — ABNORMAL LOW (ref >60)
GFR, EST NON AFRICAN AMERICAN: 44 mL/min — AB (ref >60)
Glucose, Bld: 72 mg/dL (ref 70–99)
Potassium: 3.9 mmol/L (ref 3.5–5.1)
SODIUM: 136 mmol/L (ref 135–145)

## 2018-11-24 MED ORDER — FUROSEMIDE 20 MG PO TABS: 20.0000 mg | ORAL_TABLET | Freq: Every evening | ORAL | Status: DC

## 2018-11-24 MED ORDER — FUROSEMIDE 40 MG PO TABS
40.0000 mg | ORAL_TABLET | Freq: Every morning | ORAL | Status: DC
  Administered 2018-11-24: 40 mg via ORAL
  Filled 2018-11-24: qty 1

## 2018-11-24 MED ORDER — FUROSEMIDE 40 MG PO TABS
40.0000 mg | ORAL_TABLET | Freq: Two times a day (BID) | ORAL | Status: DC
  Administered 2018-11-24 – 2018-11-25 (×2): 40 mg via ORAL
  Filled 2018-11-24 (×2): qty 1

## 2018-11-24 MED ORDER — ACETYLCYSTEINE 20 % IN SOLN
1.0000 mL | Freq: Four times a day (QID) | RESPIRATORY_TRACT | Status: DC
  Administered 2018-11-24 – 2018-11-25 (×7): 1 mL via RESPIRATORY_TRACT
  Filled 2018-11-24 (×8): qty 4

## 2018-11-24 NOTE — Progress Notes (Signed)
ABI's have been completed. Preliminary results can be found in CV Proc through chart review.   11/24/18 1:58 PM Arthur [name] RVT

## 2018-11-24 NOTE — Progress Notes (Signed)
Physical Therapy Treatment Patient Details Name: Arthur [name] MRN: [id number] DOB: Apr 09, 1925 Today's Date: 11/24/2018    History of Present Illness Patient is a 85 y/o male presenting to the ED on  11/21/17 with primary complaints of chest congestion, fever, shortness of breath. Patient was found to have right lower lung pneumonia with the large pleural effusion, hypoxic. Past history of diastolic CHF, atrial fibrillation on Eliquis, hypertension, who was recently discharged on 11/14/2018 been admitted for acute on chronic diastolic CHF.    PT Comments    Patient seated in recliner, floor around him all wet, when asked about it he states he spilled coffee, unsure what happened. Patient reluctant to participate with therapy, states he already walked down the hall this morning? And states he is doing exercises. When asked if he already did the leg exercises he said no. Agrees to move over to bed while floor and area are cleaned up. Tried to encourage him to walk further but he declined. When asked about doing exercises patient states he's not sure why he needs to do that. After some discussion agrees to do a couple of seated LE exercises. Patient quite sob with minimal activity. Sats dropped to low 80%. Patient was not on supplemental O2 when I came to room. I put him on O2 at 3lpm and notified RN. Sats rose to 92% on 3 lpm.  Patient will benefit from continued skilled PT to address his weakness, decreased activity tolerance and to improve independence with mobility. If he does not have 24hr assist at home he may need skilled nursing facility at discharge.    Follow Up Recommendations  Home health PT;Supervision for mobility/OOB;Supervision/Assistance - 24 hour     Equipment Recommendations  None recommended by PT    Recommendations for Other Services       Precautions / Restrictions Precautions Precautions: Fall Restrictions Weight Bearing Restrictions: No    Mobility  Bed Mobility                General bed mobility comments: not assessed, patient in recliner  Transfers Overall transfer level: Needs assistance Equipment used: Rolling walker (2 wheeled) Transfers: Sit to/from Stand Sit to Stand: Min guard;Modified independent (Device/Increase time)         General transfer comment: Patient had spilled coffee all over the floor and himself, got him up to sit on bed while floor cleaned and changed linens, then got him back to recliner. Patient SOB with this minimal activity therefore checked O2 sats which were at 80% on room air. Patient's O2 was not on when entered room. Returned patient to nasal canula and increased O2 to 3 lpm, sats increased to 92%.    Ambulation/Gait Ambulation/Gait assistance: Modified independent (Device/Increase time);Min guard Gait Distance (Feet): 10 Feet Assistive device: Rolling walker (2 wheeled) Gait Pattern/deviations: Step-to pattern Gait velocity: decreased   General Gait Details: Patient reports he walked down hall earlier, declined walking further in room   Stairs             Wheelchair Mobility    Modified Rankin (Stroke Patients Only)       Balance Overall balance assessment: Needs assistance Sitting-balance support: Feet supported Sitting balance-Leahy Scale: Good     Standing balance support: Bilateral upper extremity supported Standing balance-Leahy Scale: Fair Standing balance comment: patient asks me to get the walker out of the way so he can go from the bed back to recliner. But then reached for it once standing.  Cognition Arousal/Alertness: Awake/alert Behavior During Therapy: WFL for tasks assessed/performed Overall Cognitive Status: No family/caregiver present to determine baseline cognitive functioning                                 General Comments: Patient seemed confused, stated he walked down the hall earlier ( not sure if he had).         Exercises Other Exercises Other Exercises: seated marching x 15 bilaterally, seated laq x 10 bilaterally    General Comments        Pertinent Vitals/Pain Pain Assessment: Faces Faces Pain Scale: Hurts even more Pain Location: B LEs Pain Descriptors / Indicators: Discomfort;Aching Pain Intervention(s): Monitored during session    Home Living                      Prior Function            PT Goals (current goals can now be found in the care plan section) Acute Rehab PT Goals Patient Stated Goal: return home PT Goal Formulation: With patient Time For Goal Achievement: 12/06/18 Potential to Achieve Goals: Fair    Frequency    Min 3X/week      PT Plan Current plan remains appropriate    Co-evaluation              AM-PAC PT "6 Clicks" Mobility   Outcome Measure  Help needed turning from your back to your side while in a flat bed without using bedrails?: A Little Help needed moving from lying on your back to sitting on the side of a flat bed without using bedrails?: A Little Help needed moving to and from a bed to a chair (including a wheelchair)?: A Little Help needed standing up from a chair using your arms (e.g., wheelchair or bedside chair)?: A Little Help needed to walk in hospital room?: A Lot Help needed climbing 3-5 steps with a railing? : A Lot 6 Click Score: 16    End of Session Equipment Utilized During Treatment: Gait belt Activity Tolerance: Patient tolerated treatment well;Patient limited by fatigue Patient left: in chair;with call bell/phone within reach;Other (comment)(with fall mat placed in front of recliner) Nurse Communication: Mobility status;Other (comment)(O2 saturations) PT Visit Diagnosis: Unsteadiness on feet (R26.81);Muscle weakness (generalized) (M62.81);Pain Pain - Right/Left: Left Pain - part of body: Leg     Time: 1025-1050 PT Time Calculation (min) (ACUTE ONLY): 25 min  Charges:  $Therapeutic Activity:  23-37 mins                     Paulmichael Schreck, PT, GCS 11/24/18,12:01 PM

## 2018-11-24 NOTE — Care Management Note (Addendum)
Case Management Note  Patient Details  Name: Arthur [name] MRN: [id number] Date of Birth: 08-02-1925  Subjective/Objective:  Admitted with sepsis / acute hypoxic respiratory failure secondary to pneumonia. Hx of diastolic CHF, atrial fibrillation on Eliquis, hypertension.Recently discharged  11/14/2018, acute on chronic diastolic CHF.Resides with wife, Benjamine Mola. Pt states PTA independent with ADL's. Home oxygen/AHC.           Janne Lab (Spouse) Viann Fish (Daughter)      [phone]         PCP: Ina Kick  Action/Plan: Transition to home with home health services when medically ready....NCM will continue to  follow for TOC needs.  Pt has transporation to home.  Expected Discharge Date:                  Expected Discharge Plan:  [place]  In-House Referral:     Discharge planning Services  CM Consult  Post Acute Care Choice:  Resumption of Svcs/PTA Provider, Durable Medical Equipment(home oxygen/AHC) Choice offered to:  Patient,spouse  DME Arranged:   rolling walker, 3 in 1/[facility] DME Agency:   [facility]  HH Arranged:  PT, RN,OT,NA Van Agency:  [facility], pending MD's order.  Status of Service:  In process, will continue to follow  If discussed at Long Length of Stay Meetings, dates discussed:    Additional Comments:  Sharin Mons, RN 11/24/2018, 1:27 PM

## 2018-11-24 NOTE — Progress Notes (Signed)
PROGRESS NOTE        PATIENT DETAILS Name: Arthur [name] Age: 85 y.o. Sex: male Date of Birth: 11-03-1925 Admit Date: 11/21/2018 Admitting Physician Ripudeep Krystal Eaton, MD QQP:[id number], [name], MD  Brief Narrative: Patient is a 85 y.o. male with history of chronic diastolic heart failure, atrial fibrillation on Eliquis, hypertension who presented with shortness of breath, fever-he was thought to have sepsis secondary to pneumonia and a loculated right-sided pleural effusion.  See below for further details  Subjective: Seen earlier this morning-lying comfortably in bed.  Slightly disoriented but easily answering most of my questions appropriately.  Denies any worsening of her shortness of breath-in fact claimed that he felt better than yesterday.  Assessment/Plan: Sepsis with acute hypoxic respiratory failure secondary to pneumonia: Sepsis pathophysiology has improved-cultures negative so far-however urine pneumococcal antigen positive-remains on Rocephin.  If clinical improvement continues-we will transition him to an appropriate oral agent and consider discharge over the next 1-2 days.  Loculated right-sided pleural effusion: Thoracocentesis attempted by IR but unsuccessful.  Prior MD-spoke with Dr. Janean Sark advanced age and frailty-recommendations are to complete at least 2-3 weeks of antimicrobial therapy and follow-up in the outpatient setting-appointment with Dr. Elsworth Soho scheduled for 1/6 at 2:30 PM.  Hemoptysis: Resolved-suspect secondary to pneumonia.  Hypertension: Controlled, continue metoprolol  Chronic atrial fibrillation: Rate controlled with metoprolol-continue Eliquis.  Chronic diastolic heart failure: Slight lower extremity edema developing-resume oral Lasix.  History of autoimmune hepatitis: Continue chronic prednisone  Ascending aortic aneurysm: Requires semiannual imaging by CTA or MRA-this will be deferred to outpatient setting  DVT  Prophylaxis: Full dose anticoagulation with Eliquis  Code Status: Full code  Family Communication: None at bedside  Disposition Plan: Remain inpatient  Antimicrobial agents: Anti-infectives (From admission, onward)   Start     Dose/Rate Route Frequency Ordered Stop   11/22/18 2000  cefTRIAXone (ROCEPHIN) 2 g in sodium chloride 0.9 % 100 mL IVPB     2 g 200 mL/hr over 30 Minutes Intravenous Every 24 hours 11/22/18 1433     11/22/18 0800  vancomycin (VANCOCIN) IVPB 1000 mg/200 mL premix  Status:  Discontinued     1,000 mg 200 mL/hr over 60 Minutes Intravenous Every 12 hours 11/21/18 1836 11/21/18 1839   11/22/18 0800  vancomycin (VANCOCIN) IVPB 1000 mg/200 mL premix  Status:  Discontinued     1,000 mg 200 mL/hr over 60 Minutes Intravenous Every 12 hours 11/21/18 1839 11/22/18 1433   11/22/18 0100  ceFEPIme (MAXIPIME) 1 g in sodium chloride 0.9 % 100 mL IVPB  Status:  Discontinued     1 g 200 mL/hr over 30 Minutes Intravenous Every 8 hours 11/21/18 1920 11/22/18 1433   11/21/18 1900  vancomycin (VANCOCIN) 1,500 mg in sodium chloride 0.9 % 500 mL IVPB     1,500 mg 250 mL/hr over 120 Minutes Intravenous  Once 11/21/18 1833 11/21/18 2131   11/21/18 1645  vancomycin (VANCOCIN) IVPB 1000 mg/200 mL premix  Status:  Discontinued     1,000 mg 200 mL/hr over 60 Minutes Intravenous  Once 11/21/18 1639 11/21/18 1833   11/21/18 1645  ceFEPIme (MAXIPIME) 2 g in sodium chloride 0.9 % 100 mL IVPB     2 g 200 mL/hr over 30 Minutes Intravenous  Once 11/21/18 1639 11/21/18 1732      Procedures: None  CONSULTS:  None  Time  spent: 25- minutes-Greater than 50% of this time was spent in counseling, explanation of diagnosis, planning of further management, and coordination of care.  MEDICATIONS: Scheduled Meds: . acetylcysteine  1 mL Nebulization QID  . apixaban  2.5 mg Oral BID  . benzonatate  100 mg Oral TID  . cholecalciferol  5,000 Units Oral Daily  . furosemide  20 mg Oral QPM  .  furosemide  40 mg Oral q morning - 10a  . guaiFENesin  1,200 mg Oral BID  . metoprolol tartrate  12.5 mg Oral BID  . predniSONE  10 mg Oral Q breakfast  . vitamin C  250 mg Oral Daily   Continuous Infusions: . cefTRIAXone (ROCEPHIN)  IV 2 g (11/23/18 2046)   PRN Meds:.levalbuterol   PHYSICAL EXAM: Vital signs: Vitals:   11/24/18 0359 11/24/18 0800 11/24/18 0807 11/24/18 0847  BP: 130/68     Pulse: 83  88   Resp: 19 (!) 45 18 (!) 32  Temp: 97.7 F (36.5 C)     TempSrc: Oral     SpO2: 93%  95%   Weight:      Height:       Filed Weights   11/21/18 1610  Weight: 72 kg   Body mass index is 21.53 kg/m.   General appearance :Awake, alert, not in any distress.  Eyes:Pink conjunctiva HEENT: Atraumatic and Normocephalic Resp:Good air entry bilaterally-some rales at the right lung base-scattered rhonchi CVS: S1 S2 regular GI: Bowel sounds present, Non tender and not distended with no gaurding, rigidity or rebound.No organomegaly Extremities: B/L Lower Ext shows 1+ edema, both legs are warm to touch Neurology: Nonfocal-but appears to have generalized weakness Musculoskeletal:No digital cyanosis Skin:No Rash, warm and dry Wounds:N/A  I have personally reviewed following labs and imaging studies  LABORATORY DATA: CBC: Recent Labs  Lab 11/21/18 1619 11/22/18 0321 11/23/18 0433 11/24/18 0349  WBC 11.7* 11.6* 12.8* 11.1*  NEUTROABS 9.6*  --   --   --   HGB 14.3 12.2* 12.4* 12.8*  HCT 44.7 38.9* 39.5 40.1  MCV 101.6* 100.8* 100.0 101.3*  PLT 259 182 223 604    Basic Metabolic Panel: Recent Labs  Lab 11/19/18 1343 11/21/18 1619 11/22/18 0321 11/23/18 0433 11/24/18 0349  NA 139 135 135 136 136  K 4.7 4.3 3.8 3.9 3.9  CL 93* 95* 94* 97* 95*  CO2 27 29 29 31  32  GLUCOSE 198* 116* 147* 103* 72  BUN 35 30* 31* 34* 33*  CREATININE 1.26 1.38* 1.18 1.34* 1.39*  CALCIUM 8.9 9.1 8.2* 8.8* 8.9    GFR: Estimated Creatinine Clearance: 35.3 mL/min (A) (by C-G formula  based on SCr of 1.39 mg/dL (H)).  Liver Function Tests: Recent Labs  Lab 11/21/18 1619 11/22/18 1125 11/23/18 0433  AST 44*  --  34  ALT 34  --  28  ALKPHOS 147*  --  120  BILITOT 2.1*  --  1.2  PROT 7.1 6.6 6.4*  ALBUMIN 2.2*  --  1.9*   No results for input(s): LIPASE, AMYLASE in the last 168 hours. No results for input(s): AMMONIA in the last 168 hours.  Coagulation Profile: Recent Labs  Lab 11/21/18 1619  INR 1.38    Cardiac Enzymes: No results for input(s): CKTOTAL, CKMB, CKMBINDEX, TROPONINI in the last 168 hours.  BNP (last 3 results) Recent Labs    08/02/18 1152 08/09/18 1152 08/20/18 1148  PROBNP 869.0* 5,129* 4,156*    HbA1C: No results for input(s): HGBA1C in  the last 72 hours.  CBG: No results for input(s): GLUCAP in the last 168 hours.  Lipid Profile: No results for input(s): CHOL, HDL, LDLCALC, TRIG, CHOLHDL, LDLDIRECT in the last 72 hours.  Thyroid Function Tests: No results for input(s): TSH, T4TOTAL, FREET4, T3FREE, THYROIDAB in the last 72 hours.  Anemia Panel: No results for input(s): VITAMINB12, FOLATE, FERRITIN, TIBC, IRON, RETICCTPCT in the last 72 hours.  Urine analysis:    Component Value Date/Time   COLORURINE YELLOW 11/21/2018 1745   APPEARANCEUR HAZY (A) 11/21/2018 1745   LABSPEC 1.015 11/21/2018 1745   PHURINE 5.0 11/21/2018 1745   GLUCOSEU NEGATIVE 11/21/2018 1745   GLUCOSEU NEGATIVE 08/18/2016 1534   HGBUR NEGATIVE 11/21/2018 1745   HGBUR negative 10/29/2010 0807   BILIRUBINUR NEGATIVE 11/21/2018 1745   KETONESUR NEGATIVE 11/21/2018 1745   PROTEINUR NEGATIVE 11/21/2018 1745   UROBILINOGEN 0.2 08/18/2016 1534   NITRITE NEGATIVE 11/21/2018 1745   LEUKOCYTESUR NEGATIVE 11/21/2018 1745    Sepsis Labs: Lactic Acid, Venous    Component Value Date/Time   LATICACIDVEN 1.44 11/21/2018 1820    MICROBIOLOGY: Recent Results (from the past 240 hour(s))  Culture, blood (Routine x 2)     Status: None (Preliminary result)    Collection Time: 11/21/18  4:13 PM  Result Value Ref Range Status   Specimen Description BLOOD BLOOD RIGHT FOREARM  Final   Special Requests   Final    BOTTLES DRAWN AEROBIC AND ANAEROBIC Blood Culture adequate volume   Culture   Final    NO GROWTH 3 DAYS Performed at [facility], [place] [address], [place], [place] [address]    Report Status PENDING  Incomplete  Culture, blood (Routine x 2)     Status: None (Preliminary result)   Collection Time: 11/21/18  4:18 PM  Result Value Ref Range Status   Specimen Description BLOOD BLOOD LEFT FOREARM  Final   Special Requests   Final    BOTTLES DRAWN AEROBIC AND ANAEROBIC Blood Culture adequate volume   Culture   Final    NO GROWTH 3 DAYS Performed at [facility], [place] [address] [place], [place] [address]    Report Status PENDING  Incomplete    RADIOLOGY STUDIES/RESULTS: Dg Chest 1 View  Result Date: 11/22/2018 CLINICAL DATA:  Failed right thoracentesis EXAM: CHEST  1 VIEW COMPARISON:  11/21/2018 FINDINGS: Pleural thickening/loculated fluid in the right base and along the right lateral pleural space is unchanged. No pneumothorax post attempted thoracentesis. Minimal left effusion.  Right lower lobe consolidation unchanged. Cardiac enlargement without edema.  Atherosclerotic aortic arch. IMPRESSION: Pleural thickening/loculated fluid on the right unchanged. No pneumothorax. Electronically Signed   By: Franchot Gallo M.D.   On: 11/22/2018 10:16   Ct Chest Wo Contrast  Result Date: 11/22/2018 CLINICAL DATA:  85 year old male with diastolic CHF and atrial fibrillation presents with hypoxia and sepsis. EXAM: CT CHEST WITHOUT CONTRAST TECHNIQUE: Multidetector CT imaging of the chest was performed following the standard protocol without IV contrast. COMPARISON:  08/09/2018 CT, PET CT 07/29/2013 FINDINGS: Cardiovascular: Moderate atherosclerosis of the great vessels and aorta. Aneurysmal dilatation of the ascending thoracic aorta to  approximately 4.6 x 4.7 cm, series 3/78. This is unchanged and stable. Left main and three-vessel coronary arteriosclerosis is noted. There is cardiomegaly without pericardial effusion or thickening. Dilatation of the main pulmonary artery to 3.5 cm compatible with chronic pulmonary hypertension. Mediastinum/Nodes: Fat containing right paratracheal mildly enlarged lymph nodes possibly reactive. Assessment for hilar adenopathy is limited  due to lack of IV contrast. The trachea and mainstem bronchi are patent. However there are soft tissue densities impacting the right lower lobe bronchi causing postobstructive atelectasis within the right lower lobe. The CT appearance of the esophagus is unremarkable. Lungs/Pleura: Chronic loculated right-sided pleural effusion increased in thickness since prior, estimated up to 2.4 cm versus 0.8 cm at the same level previously. As stated there is postobstructive atelectasis from mucus inspissation within bronchi to the right lower lobe. Suspect a new focus of rounded atelectasis in the left lower lobe better visualized given interval decrease in size of the small left pleural effusion. Chronic biapical pleuroparenchymal scarring is noted. No dominant mass is seen. Upper Abdomen: Mild morphologic changes of cirrhosis with surface nodularity of the included liver. No splenomegaly or ascites. The included adrenal glands and upper poles both kidneys are nonacute. The gallbladder is excluded on this study. Musculoskeletal: Unchanged nonspecific sclerotic density of the T7 vertebral body dating back to prior PET-CT from 07/29/2013. No acute nor aggressive osseous lesions. IMPRESSION: 1. Stable aneurysmal dilatation of the ascending thoracic aorta to 4.6 x 4.7 cm. Recommend semi-annual imaging followup by CTA or MRA and referral to cardiothoracic surgery if not already obtained. This recommendation follows 2010 ACCF/AHA/AATS/ACR/ASA/SCA/SCAI/SIR/STS/SVM Guidelines for the Diagnosis and  Management of Patients With Thoracic Aortic Disease. 2010; 121: R678-L381. 2. Chronic loculated right-sided pleural effusion with postobstructive atelectasis of the right lower lobe due to mucus inspissation within lower lobe bronchi. The fluid loculation is more prominent on current exam measuring up to 2.4 cm thickness versus 0.8 cm at the same level previously within the right mid hemithorax. 3. Suspect a new focus of rounded atelectasis in the left lower lobe given interval decrease in size of the small left pleural effusion. 4. Stigmata of cirrhosis without splenomegaly or ascites. Aortic Atherosclerosis (ICD10-I70.0). Electronically Signed   By: Ashley Royalty M.D.   On: 11/22/2018 19:34   Dg Chest Right Decubitus  Result Date: 11/21/2018 CLINICAL DATA:  Evaluate right pleural effusion. EXAM: CHEST - RIGHT DECUBITUS COMPARISON:  11/21/2018 at 1701 hours FINDINGS: Right lower the cubitus views show layering of the right pleural effusion. There is hazy opacity underlying lung which may reflect atelectasis, edema or possibly infection. IMPRESSION: Moderate free-flowing right pleural effusion. Electronically Signed   By: Lajean Manes M.D.   On: 11/21/2018 19:08   Dg Chest Portable 1 View  Result Date: 11/21/2018 CLINICAL DATA:  Cough, fever and sepsis. Recent hospital admission for CHF. EXAM: PORTABLE CHEST 1 VIEW COMPARISON:  Chest radiograph November 12, 2018 FINDINGS: Increased moderate to large RIGHT pleural effusion with underlying airspace opacities. Mild chronic interstitial changes. Stable cardiomegaly. Calcified aortic arch. No pneumothorax. Included soft tissue planes and osseous structures are unchanged. Stable solitary sclerotic lesion midthoracic spine. IMPRESSION: 1. Increased moderate to large RIGHT pleural effusion with underlying airspace opacity. 2. Stable cardiomegaly. 3.  Aortic Atherosclerosis (ICD10-I70.0). Electronically Signed   By: Elon Alas M.D.   On: 11/21/2018 17:19   Dg  Chest Port 1 View  Result Date: 11/12/2018 CLINICAL DATA:  Short of breath EXAM: PORTABLE CHEST 1 VIEW COMPARISON:  08/02/2018 FINDINGS: The heart remains moderately enlarged. Dense opacity at the right base has increased likely combination of pleural fluid and consolidation. Stable small left pleural effusion. Stable vascular congestion. No pneumothorax. Stable deformity of the peripheral right clavicle. IMPRESSION: Increasing opacity at the right lung base likely a combination of pleural fluid and consolidation. Electronically Signed   By: Rodena Goldmann.D.  On: 11/12/2018 16:49   Ir Thoracentesis Asp Pleural Space W/img Guide  Result Date: 11/22/2018 CLINICAL DATA:  Increasing right lateral pleural opacity with suggestion of free-flowing component on decubitus radiograph. EXAM: EXAM THORACENTESIS WITH ULTRASOUND TECHNIQUE: The procedure, risks (including but not limited to bleeding, infection, organ damage ), benefits, and alternatives were explained to the patient. Questions regarding the procedure were encouraged and answered. The patient understands and consents to the procedure. Survey ultrasound of the right hemithorax was performed and an appropriate skin entry site was localized. Site was marked, prepped with Betadine, draped in usual sterile fashion, infiltrated locally with 1% lidocaine. The Safe-T-Centesis needle was advanced into the pleural space. No significant fluid returned. Under ultrasound guidance, the pocket was again localized under real-time ultrasound guidance, the catheter advanced again into the pleural process, but no fluid could be aspirated. The catheter was removed. The patient tolerated the procedure well. COMPLICATIONS: COMPLICATIONS None immediate IMPRESSION: 1. Attempted ultrasound-guided right thoracentesis, with no return of fluid after 2 attempts. Consider CT chest for further characterization of the pleural process. Electronically Signed   By: Lucrezia Europe M.D.   On:  11/22/2018 10:50     LOS: 3 days   Oren Binet, MD  [facility]  If 7PM-7AM, please contact night-coverage  Please page via [email or web address]-Password [email or web address]-click on MD name and type text message  11/24/2018, 10:31 AM

## 2018-11-25 ENCOUNTER — Telehealth: Payer: Self-pay | Admitting: Cardiovascular Disease

## 2018-11-25 DIAGNOSIS — N183 Chronic kidney disease, stage 3 (moderate): Secondary | ICD-10-CM

## 2018-11-25 MED ORDER — LEVALBUTEROL HCL 0.63 MG/3ML IN NEBU: 0.6300 mg | INHALATION_SOLUTION | Freq: Four times a day (QID) | RESPIRATORY_TRACT | Status: DC

## 2018-11-25 MED ORDER — BENZONATATE 100 MG PO CAPS: 100.0000 mg | ORAL_CAPSULE | Freq: Three times a day (TID) | ORAL | 0 refills | Status: DC | PRN

## 2018-11-25 MED ORDER — CEFDINIR 300 MG PO CAPS
300.0000 mg | ORAL_CAPSULE | Freq: Two times a day (BID) | ORAL | Status: DC
  Administered 2018-11-25: 300 mg via ORAL
  Filled 2018-11-25 (×2): qty 1

## 2018-11-25 MED ORDER — CEFDINIR 300 MG PO CAPS: 300.0000 mg | ORAL_CAPSULE | Freq: Two times a day (BID) | ORAL | 0 refills | Status: DC

## 2018-11-25 MED ORDER — LEVALBUTEROL HCL 0.63 MG/3ML IN NEBU: 0.6300 mg | INHALATION_SOLUTION | RESPIRATORY_TRACT | Status: DC | PRN

## 2018-11-25 MED ORDER — ALBUTEROL SULFATE 108 (90 BASE) MCG/ACT IN AEPB: 1.0000 | INHALATION_SPRAY | RESPIRATORY_TRACT | 0 refills | Status: AC | PRN

## 2018-11-25 MED ORDER — LEVALBUTEROL HCL 0.63 MG/3ML IN NEBU
0.6300 mg | INHALATION_SOLUTION | Freq: Four times a day (QID) | RESPIRATORY_TRACT | Status: DC | PRN
  Administered 2018-11-25 (×3): 0.63 mg via RESPIRATORY_TRACT
  Filled 2018-11-25 (×3): qty 3

## 2018-11-25 MED ORDER — GUAIFENESIN ER 600 MG PO TB12: 600.0000 mg | ORAL_TABLET | Freq: Two times a day (BID) | ORAL | 0 refills | Status: DC

## 2018-11-25 NOTE — Plan of Care (Signed)
  Problem: Education: Goal: Knowledge of General Education information will improve Description Including pain rating scale, medication(s)/side effects and non-pharmacologic comfort measures Outcome: Progressing   Problem: Fluid Volume: Goal: Hemodynamic stability will improve Outcome: Progressing   Problem: Clinical Measurements: Goal: Diagnostic test results will improve Outcome: Progressing   Problem: Respiratory: Goal: Ability to maintain adequate ventilation will improve Outcome: Progressing   Problem: Education: Goal: Ability to verbalize understanding of medication therapies will improve Outcome: Progressing

## 2018-11-25 NOTE — Telephone Encounter (Signed)
New Message:     Pt handicap sticker have expired. Daughter would like Dr Burt Knack to fill out the paper work, so pt can get a new sticker please.

## 2018-11-25 NOTE — Progress Notes (Signed)
Patient with discharge orders.  Awaiting DME equipment and medications to arrive at patients bedside.  Daughters at bedside and state they have things to do and will have to come back to pick patient up.

## 2018-11-25 NOTE — Telephone Encounter (Signed)
Ms. Arthur [name] requests new handicap placard filled out for Mr. [name]. She requests a call when it is filled out so she can come pick it up.  She understands Dr. Burt Knack will not be in the office until late next week. If possible, she requests [name] to fill out prior to that time.  She was grateful for assistance.

## 2018-11-25 NOTE — Progress Notes (Signed)
Patients DME equipment has arrived to the bedside. Patient states he already has a walker at home and does not need one, but will take the 3-in-1 bedside commode.  Informed that patients medications would no longer be delivered to the bedside and they will be sent to [facility].  Called patients daughter to inform them of this.  Patients daughter also mentioned that they did not feel comfortable taking patient home without walking him to check his oxygen again.  Patient was walked again on room air, oxygen saturations sustained in the upper 90's.  Discharge instructions reviewed with patient and daughters including medications, follow up appointments, and self care.  Patient and daughters verbalized an understanding of all instructions.  IV access was d/c'd.  Patient will be escorted out via wheelchair.

## 2018-11-25 NOTE — Discharge Summary (Addendum)
PATIENT DETAILS Name: Arthur [name] Age: 85 y.o. Sex: male Date of Birth: 1925/07/15 MRN: [id number]. Admitting Physician: Mendel Corning, MD EZM:[id number], [name], MD  Admit Date: 11/21/2018 Discharge date: 11/25/2018  Recommendations for Outpatient Follow-up:  1. Follow up with PCP in 1-2 weeks 2. Please obtain BMP/CBC in one week 3. Please ensure follow-up with pulmonology 4. Incidentally found to have an ascending aortic aneurysm-requires semiannual imaging by CTA or MRA-defer to PCP.   Admitted From:  Home  Disposition: [place]: Yes  Equipment/Devices: None  Discharge Condition: Stable  CODE STATUS: FULL CODE  Diet recommendation:  Heart Healthy  Brief Summary: See H&P, Labs, Consult and Test reports for all details in brief, Patient is a 85 y.o. male with history of chronic diastolic heart failure, atrial fibrillation on Eliquis, hypertension who presented with shortness of breath, fever-he was thought to have sepsis secondary to pneumonia and a loculated right-sided pleural effusion.  See below for further details  Brief Hospital Course: Sepsis with acute hypoxic respiratory failure secondary to pneumonia: Sepsis pathophysiology has improved-cultures negative so far-however urine pneumococcal antigen positive-treated with Rocephin.  Since clinically improved-with complete resolution of sepsis pathophysiology-he is considered stable to be discharged home-we will transition him to [facility] on discharge.  Outpatient follow-up with pulmonologist arranged-if felt that he needs additional antimicrobial therapy-this can be prolonged by his pulmonologist.    Loculated right-sided pleural effusion: Thoracocentesis attempted by IR but unsuccessful.  Prior MD-spoke with Dr. Janean Sark advanced age and frailty-recommendations are to complete at least 2-3 weeks of antimicrobial therapy and follow-up in the outpatient setting-appointment with Dr. Elsworth Soho scheduled for 1/16  at 2:30 PM.  Patient has had chronic recurrent right-sided pleural effusion in the past-and has seen cardiothoracic surgery as well.  Hemoptysis: Resolved-suspect secondary to pneumonia.  Hypertension: Controlled, continue metoprolol  Chronic atrial fibrillation: Rate controlled with metoprolol-continue Eliquis.  Chronic diastolic heart failure:  Minimal lower extremity edema-continue Lasix on discharge.  History of autoimmune hepatitis: Continue chronic prednisone  Ascending aortic aneurysm (4.6 x 4.7 cm): Requires semiannual imaging by CTA or MRA-this will be deferred to outpatient setting  PAD: Claims he had a venous ulcer in the right leg requiring varicose vein surgery-claims for the past couple of years his temperature in the right lower extremity is slightly cooler than his left leg.  ABI showed calcified vessels-ABI was not measurable-but a TBI was within normal limits.  Have asked patient to follow-up with his vascular surgeon.  Procedures/Studies: None  Discharge Diagnoses:  Principal Problem:   Sepsis ([place]) Active Problems:   HLD (hyperlipidemia)   Essential hypertension   Atrial fibrillation (HCC)   Acute respiratory failure (HCC)   CKD (chronic kidney disease) stage 3, GFR 30-59 ml/min (HCC)   CHF (congestive heart failure) ([place])   Discharge Instructions:  Activity:  As tolerated with Full fall precautions use walker/cane & assistance as needed   Discharge Instructions    Diet - low sodium heart healthy   Complete by:  As directed    Discharge instructions   Complete by:  As directed    Follow with Primary MD  Biagio Borg, MD in 1 week  Follow with pulmonologist as instructed  Please get a complete blood count and chemistry panel checked by your Primary MD at your next visit, and again as instructed by your Primary MD.  Get Medicines reviewed and adjusted: Please take all your medications with you for your next visit with your  Primary  MD  Laboratory/radiological data: Please request your Primary MD to go over all hospital tests and procedure/radiological results at the follow up, please ask your Primary MD to get all Hospital records sent to his/her office.  In some cases, they will be blood work, cultures and biopsy results pending at the time of your discharge. Please request that your primary care M.D. follows up on these results.  Also Note the following: If you experience worsening of your admission symptoms, develop shortness of breath, life threatening emergency, suicidal or homicidal thoughts you must seek medical attention immediately by calling 911 or calling your MD immediately  if symptoms less severe.  You must read complete instructions/literature along with all the possible adverse reactions/side effects for all the Medicines you take and that have been prescribed to you. Take any new Medicines after you have completely understood and accpet all the possible adverse reactions/side effects.   Do not drive when taking Pain medications or sleeping medications (Benzodaizepines)  Do not take more than prescribed Pain, Sleep and Anxiety Medications. It is not advisable to combine anxiety,sleep and pain medications without talking with your primary care practitioner  Special Instructions: If you have smoked or chewed Tobacco  in the last 2 yrs please stop smoking, stop any regular Alcohol  and or any Recreational drug use.  Wear Seat belts while driving.  Please note: You were cared for by a hospitalist during your hospital stay. Once you are discharged, your primary care physician will handle any further medical issues. Please note that NO REFILLS for any discharge medications will be authorized once you are discharged, as it is imperative that you return to your primary care physician (or establish a relationship with a primary care physician if you do not have one) for your post hospital discharge needs so that  they can reassess your need for medications and monitor your lab values.   Increase activity slowly   Complete by:  As directed      Allergies as of 11/25/2018      Reactions   Penicillins Hives   Has patient had a PCN reaction causing immediate rash, facial/tongue/throat swelling, SOB or lightheadedness with hypotension:Yes Has patient had a PCN reaction causing severe rash involving mucus membranes or skin necrosis: No Has patient had a PCN reaction that required hospitalization: No Has patient had a PCN reaction occurring within the last 10 years: No If all of the above answers are "NO", then may proceed with Cephalosporin use.      Medication List    TAKE these medications   Albuterol Sulfate 108 (90 Base) MCG/ACT Aepb Commonly known as:  PROAIR RESPICLICK Inhale 1 puff into the lungs every 4 (four) hours as needed (SOB).   apixaban 2.5 MG Tabs tablet Commonly known as:  ELIQUIS Take 1 tablet (2.5 mg total) by mouth 2 (two) times daily.   benzonatate 100 MG capsule Commonly known as:  TESSALON Take 1 capsule (100 mg total) by mouth 3 (three) times daily as needed for cough.   cefdinir 300 MG capsule Commonly known as:  OMNICEF Take 1 capsule (300 mg total) by mouth every 12 (twelve) hours.   furosemide 20 MG tablet Commonly known as:  LASIX Take 1 tablet (20 mg total) by mouth every evening.   furosemide 40 MG tablet Commonly known as:  LASIX Take 1 tablet (40 mg total) by mouth every morning.   guaiFENesin 600 MG 12 hr tablet Commonly known as:  MUCINEX Take  1 tablet (600 mg total) by mouth 2 (two) times daily.   metoprolol tartrate 25 MG tablet Commonly known as:  LOPRESSOR Take 0.5 tablets (12.5 mg total) by mouth 2 (two) times daily.   predniSONE 5 MG tablet Commonly known as:  DELTASONE Take 2 tablets (10 mg total) by mouth daily with breakfast.   VITAMIN A PO Take 1 capsule by mouth daily.   VITAMIN C PO Take 1 tablet by mouth daily.   Vitamin D3  125 MCG (5000 UT) Caps Take 5,000 Units by mouth daily.      Follow-up Information    Rigoberto Noel, MD Follow up.   Specialty:  Pulmonary Disease Why:   12/02/2018 at 2:30 PM Contact information: [address] [place] [address] [phone]        [facility] [place] Follow up.   Specialty:  Home Health Services Why:  home health services arranged Contact information: [place] [address] [place] Follow up.   Why:  3 in1/ BSC will be delivered to bedside prior to discharge Contact information: [place] [address] [phone]        Biagio Borg, MD. Schedule an appointment as soon as possible for a visit in 1 week(s).   Specialties:  Internal Medicine, Radiology Contact information: [place] [address] [phone]        Sherren Mocha, MD. Schedule an appointment as soon as possible for a visit in 1 month(s).   Specialty:  Cardiology Contact information: [address] [place] [address] [phone]        Mal Misty, MD. Schedule an appointment as soon as possible for a visit in 1 week(s).   Specialties:  Vascular Surgery, Interventional Cardiology, Cardiology         Allergies  Allergen Reactions  . Penicillins Hives    Has patient had a PCN reaction causing immediate rash, facial/tongue/throat swelling, SOB or lightheadedness with hypotension:Yes Has patient had a PCN reaction causing severe rash involving mucus membranes or skin necrosis: No Has patient had a PCN reaction that required hospitalization: No Has patient had a PCN reaction occurring within the last 10 years: No If all of the above answers are "NO", then may proceed with Cephalosporin use.     Consultations:   None  Other Procedures/Studies: Dg Chest 1 View  Result Date: 11/22/2018 CLINICAL DATA:  Failed  right thoracentesis EXAM: CHEST  1 VIEW COMPARISON:  11/21/2018 FINDINGS: Pleural thickening/loculated fluid in the right base and along the right lateral pleural space is unchanged. No pneumothorax post attempted thoracentesis. Minimal left effusion.  Right lower lobe consolidation unchanged. Cardiac enlargement without edema.  Atherosclerotic aortic arch. IMPRESSION: Pleural thickening/loculated fluid on the right unchanged. No pneumothorax. Electronically Signed   By: Franchot Gallo M.D.   On: 11/22/2018 10:16   Ct Chest Wo Contrast  Result Date: 11/22/2018 CLINICAL DATA:  85 year old male with diastolic CHF and atrial fibrillation presents with hypoxia and sepsis. EXAM: CT CHEST WITHOUT CONTRAST TECHNIQUE: Multidetector CT imaging of the chest was performed following the standard protocol without IV contrast. COMPARISON:  08/09/2018 CT, PET CT 07/29/2013 FINDINGS: Cardiovascular: Moderate atherosclerosis of the great vessels and aorta. Aneurysmal dilatation of the ascending thoracic aorta to approximately 4.6 x 4.7 cm, series 3/78. This is unchanged and stable. Left main and  three-vessel coronary arteriosclerosis is noted. There is cardiomegaly without pericardial effusion or thickening. Dilatation of the main pulmonary artery to 3.5 cm compatible with chronic pulmonary hypertension. Mediastinum/Nodes: Fat containing right paratracheal mildly enlarged lymph nodes possibly reactive. Assessment for hilar adenopathy is limited due to lack of IV contrast. The trachea and mainstem bronchi are patent. However there are soft tissue densities impacting the right lower lobe bronchi causing postobstructive atelectasis within the right lower lobe. The CT appearance of the esophagus is unremarkable. Lungs/Pleura: Chronic loculated right-sided pleural effusion increased in thickness since prior, estimated up to 2.4 cm versus 0.8 cm at the same level previously. As stated there is postobstructive atelectasis from mucus  inspissation within bronchi to the right lower lobe. Suspect a new focus of rounded atelectasis in the left lower lobe better visualized given interval decrease in size of the small left pleural effusion. Chronic biapical pleuroparenchymal scarring is noted. No dominant mass is seen. Upper Abdomen: Mild morphologic changes of cirrhosis with surface nodularity of the included liver. No splenomegaly or ascites. The included adrenal glands and upper poles both kidneys are nonacute. The gallbladder is excluded on this study. Musculoskeletal: Unchanged nonspecific sclerotic density of the T7 vertebral body dating back to prior PET-CT from 07/29/2013. No acute nor aggressive osseous lesions. IMPRESSION: 1. Stable aneurysmal dilatation of the ascending thoracic aorta to 4.6 x 4.7 cm. Recommend semi-annual imaging followup by CTA or MRA and referral to cardiothoracic surgery if not already obtained. This recommendation follows 2010 ACCF/AHA/AATS/ACR/ASA/SCA/SCAI/SIR/STS/SVM Guidelines for the Diagnosis and Management of Patients With Thoracic Aortic Disease. 2010; 121: Z610-R604. 2. Chronic loculated right-sided pleural effusion with postobstructive atelectasis of the right lower lobe due to mucus inspissation within lower lobe bronchi. The fluid loculation is more prominent on current exam measuring up to 2.4 cm thickness versus 0.8 cm at the same level previously within the right mid hemithorax. 3. Suspect a new focus of rounded atelectasis in the left lower lobe given interval decrease in size of the small left pleural effusion. 4. Stigmata of cirrhosis without splenomegaly or ascites. Aortic Atherosclerosis (ICD10-I70.0). Electronically Signed   By: Ashley Royalty M.D.   On: 11/22/2018 19:34   Dg Chest Right Decubitus  Result Date: 11/21/2018 CLINICAL DATA:  Evaluate right pleural effusion. EXAM: CHEST - RIGHT DECUBITUS COMPARISON:  11/21/2018 at 1701 hours FINDINGS: Right lower the cubitus views show layering of the  right pleural effusion. There is hazy opacity underlying lung which may reflect atelectasis, edema or possibly infection. IMPRESSION: Moderate free-flowing right pleural effusion. Electronically Signed   By: Lajean Manes M.D.   On: 11/21/2018 19:08   Dg Chest Portable 1 View  Result Date: 11/21/2018 CLINICAL DATA:  Cough, fever and sepsis. Recent hospital admission for CHF. EXAM: PORTABLE CHEST 1 VIEW COMPARISON:  Chest radiograph November 12, 2018 FINDINGS: Increased moderate to large RIGHT pleural effusion with underlying airspace opacities. Mild chronic interstitial changes. Stable cardiomegaly. Calcified aortic arch. No pneumothorax. Included soft tissue planes and osseous structures are unchanged. Stable solitary sclerotic lesion midthoracic spine. IMPRESSION: 1. Increased moderate to large RIGHT pleural effusion with underlying airspace opacity. 2. Stable cardiomegaly. 3.  Aortic Atherosclerosis (ICD10-I70.0). Electronically Signed   By: Elon Alas M.D.   On: 11/21/2018 17:19   Dg Chest Port 1 View  Result Date: 11/12/2018 CLINICAL DATA:  Short of breath EXAM: PORTABLE CHEST 1 VIEW COMPARISON:  08/02/2018 FINDINGS: The heart remains moderately enlarged. Dense opacity at the right base has increased likely combination of pleural fluid  and consolidation. Stable small left pleural effusion. Stable vascular congestion. No pneumothorax. Stable deformity of the peripheral right clavicle. IMPRESSION: Increasing opacity at the right lung base likely a combination of pleural fluid and consolidation. Electronically Signed   By: Marybelle Killings M.D.   On: 11/12/2018 16:49   Vas [place] Burnard Bunting With/wo Tbi  Result Date: 11/24/2018 LOWER EXTREMITY DOPPLER STUDY Indications: Cold foot.  Performing Technologist: Carlos Levering RVT  Examination Guidelines: A complete evaluation includes at minimum, Doppler waveform signals and systolic blood pressure reading at the level of bilateral brachial, anterior tibial, and  posterior tibial arteries, when vessel segments are accessible. Bilateral testing is considered an integral part of a complete examination. Photoelectric Plethysmograph (PPG) waveforms and toe systolic pressure readings are included as required and additional duplex testing as needed. Limited examinations for reoccurring indications may be performed as noted.  ABI Findings: +---------+------------------+-----+---------+--------+ Right    Rt Pressure (mmHg)IndexWaveform Comment  +---------+------------------+-----+---------+--------+ Brachial 124                    triphasic         +---------+------------------+-----+---------+--------+ PTA      254               1.91 biphasic          +---------+------------------+-----+---------+--------+ DP       254               1.91 biphasic          +---------+------------------+-----+---------+--------+ Great Toe97                0.73                   +---------+------------------+-----+---------+--------+ +---------+------------------+-----+---------+-------------------+ Left     Lt Pressure (mmHg)IndexWaveform Comment             +---------+------------------+-----+---------+-------------------+ Brachial 133                    triphasic                    +---------+------------------+-----+---------+-------------------+ PTA      248               1.86 biphasic Flow reversal noted +---------+------------------+-----+---------+-------------------+ DP       254               1.91 biphasic                     +---------+------------------+-----+---------+-------------------+ Great Toe109               0.82                              +---------+------------------+-----+---------+-------------------+ +-------+-----------+-----------+------------+------------+ ABI/TBIToday's ABIToday's TBIPrevious ABIPrevious TBI +-------+-----------+-----------+------------+------------+ Right             0.73                                 +-------+-----------+-----------+------------+------------+ Left              0.82                                +-------+-----------+-----------+------------+------------+  Summary: Right: Resting right ankle-brachial index indicates noncompressible right lower extremity arteries.The right toe-brachial index is normal.  Left: Resting left ankle-brachial index indicates noncompressible left lower extremity arteries.The left toe-brachial index is normal.  *See table(s) above for measurements and observations.  Electronically signed by Harold Barban MD on 11/24/2018 at 6:00:32 PM.   Final    Ir Thoracentesis Asp Pleural Space W/img Guide  Result Date: 11/22/2018 CLINICAL DATA:  Increasing right lateral pleural opacity with suggestion of free-flowing component on decubitus radiograph. EXAM: EXAM THORACENTESIS WITH ULTRASOUND TECHNIQUE: The procedure, risks (including but not limited to bleeding, infection, organ damage ), benefits, and alternatives were explained to the patient. Questions regarding the procedure were encouraged and answered. The patient understands and consents to the procedure. Survey ultrasound of the right hemithorax was performed and an appropriate skin entry site was localized. Site was marked, prepped with Betadine, draped in usual sterile fashion, infiltrated locally with 1% lidocaine. The Safe-T-Centesis needle was advanced into the pleural space. No significant fluid returned. Under ultrasound guidance, the pocket was again localized under real-time ultrasound guidance, the catheter advanced again into the pleural process, but no fluid could be aspirated. The catheter was removed. The patient tolerated the procedure well. COMPLICATIONS: COMPLICATIONS None immediate IMPRESSION: 1. Attempted ultrasound-guided right thoracentesis, with no return of fluid after 2 attempts. Consider CT chest for further characterization of the pleural process. Electronically Signed   By:  Lucrezia Europe M.D.   On: 11/22/2018 10:50     TODAY-DAY OF DISCHARGE:  Subjective:   Arthur [name] today has no headache,no chest abdominal pain,no new weakness tingling or numbness, feels much better wants to go home today.   Objective:   Blood pressure 132/77, pulse 79, temperature 97.7 F (36.5 C), temperature source Oral, resp. rate (!) 28, height 6' (1.829 m), weight 72 kg, SpO2 96 %.  Intake/Output Summary (Last 24 hours) at 11/25/2018 0942 Last data filed at 11/25/2018 7096 Gross per 24 hour  Intake 1080 ml  Output 1650 ml  Net -570 ml   Filed Weights   11/21/18 1610  Weight: 72 kg    Exam: Awake Alert, Oriented *3, No new F.N deficits, Normal affect [place].AT,PERRAL Supple Neck,No JVD, No cervical lymphadenopathy appriciated.  Symmetrical Chest wall movement, Good air movement bilaterally, CTAB RRR,No Gallops,Rubs or new Murmurs, No Parasternal Heave +ve B.Sounds, Abd Soft, Non tender, No organomegaly appriciated, No rebound -guarding or rigidity. No Cyanosis, Clubbing or edema, No new Rash or bruise   PERTINENT RADIOLOGIC STUDIES: Dg Chest 1 View  Result Date: 11/22/2018 CLINICAL DATA:  Failed right thoracentesis EXAM: CHEST  1 VIEW COMPARISON:  11/21/2018 FINDINGS: Pleural thickening/loculated fluid in the right base and along the right lateral pleural space is unchanged. No pneumothorax post attempted thoracentesis. Minimal left effusion.  Right lower lobe consolidation unchanged. Cardiac enlargement without edema.  Atherosclerotic aortic arch. IMPRESSION: Pleural thickening/loculated fluid on the right unchanged. No pneumothorax. Electronically Signed   By: Franchot Gallo M.D.   On: 11/22/2018 10:16   Ct Chest Wo Contrast  Result Date: 11/22/2018 CLINICAL DATA:  85 year old male with diastolic CHF and atrial fibrillation presents with hypoxia and sepsis. EXAM: CT CHEST WITHOUT CONTRAST TECHNIQUE: Multidetector CT imaging of the chest was performed following the standard  protocol without IV contrast. COMPARISON:  08/09/2018 CT, PET CT 07/29/2013 FINDINGS: Cardiovascular: Moderate atherosclerosis of the great vessels and aorta. Aneurysmal dilatation of the ascending thoracic aorta to approximately 4.6 x 4.7 cm, series 3/78. This is unchanged and stable. Left main and three-vessel coronary arteriosclerosis is noted. There is cardiomegaly without pericardial effusion or thickening. Dilatation  of the main pulmonary artery to 3.5 cm compatible with chronic pulmonary hypertension. Mediastinum/Nodes: Fat containing right paratracheal mildly enlarged lymph nodes possibly reactive. Assessment for hilar adenopathy is limited due to lack of IV contrast. The trachea and mainstem bronchi are patent. However there are soft tissue densities impacting the right lower lobe bronchi causing postobstructive atelectasis within the right lower lobe. The CT appearance of the esophagus is unremarkable. Lungs/Pleura: Chronic loculated right-sided pleural effusion increased in thickness since prior, estimated up to 2.4 cm versus 0.8 cm at the same level previously. As stated there is postobstructive atelectasis from mucus inspissation within bronchi to the right lower lobe. Suspect a new focus of rounded atelectasis in the left lower lobe better visualized given interval decrease in size of the small left pleural effusion. Chronic biapical pleuroparenchymal scarring is noted. No dominant mass is seen. Upper Abdomen: Mild morphologic changes of cirrhosis with surface nodularity of the included liver. No splenomegaly or ascites. The included adrenal glands and upper poles both kidneys are nonacute. The gallbladder is excluded on this study. Musculoskeletal: Unchanged nonspecific sclerotic density of the T7 vertebral body dating back to prior PET-CT from 07/29/2013. No acute nor aggressive osseous lesions. IMPRESSION: 1. Stable aneurysmal dilatation of the ascending thoracic aorta to 4.6 x 4.7 cm. Recommend  semi-annual imaging followup by CTA or MRA and referral to cardiothoracic surgery if not already obtained. This recommendation follows 2010 ACCF/AHA/AATS/ACR/ASA/SCA/SCAI/SIR/STS/SVM Guidelines for the Diagnosis and Management of Patients With Thoracic Aortic Disease. 2010; 121: G626-R485. 2. Chronic loculated right-sided pleural effusion with postobstructive atelectasis of the right lower lobe due to mucus inspissation within lower lobe bronchi. The fluid loculation is more prominent on current exam measuring up to 2.4 cm thickness versus 0.8 cm at the same level previously within the right mid hemithorax. 3. Suspect a new focus of rounded atelectasis in the left lower lobe given interval decrease in size of the small left pleural effusion. 4. Stigmata of cirrhosis without splenomegaly or ascites. Aortic Atherosclerosis (ICD10-I70.0). Electronically Signed   By: Ashley Royalty M.D.   On: 11/22/2018 19:34   Dg Chest Right Decubitus  Result Date: 11/21/2018 CLINICAL DATA:  Evaluate right pleural effusion. EXAM: CHEST - RIGHT DECUBITUS COMPARISON:  11/21/2018 at 1701 hours FINDINGS: Right lower the cubitus views show layering of the right pleural effusion. There is hazy opacity underlying lung which may reflect atelectasis, edema or possibly infection. IMPRESSION: Moderate free-flowing right pleural effusion. Electronically Signed   By: Lajean Manes M.D.   On: 11/21/2018 19:08   Dg Chest Portable 1 View  Result Date: 11/21/2018 CLINICAL DATA:  Cough, fever and sepsis. Recent hospital admission for CHF. EXAM: PORTABLE CHEST 1 VIEW COMPARISON:  Chest radiograph November 12, 2018 FINDINGS: Increased moderate to large RIGHT pleural effusion with underlying airspace opacities. Mild chronic interstitial changes. Stable cardiomegaly. Calcified aortic arch. No pneumothorax. Included soft tissue planes and osseous structures are unchanged. Stable solitary sclerotic lesion midthoracic spine. IMPRESSION: 1. Increased  moderate to large RIGHT pleural effusion with underlying airspace opacity. 2. Stable cardiomegaly. 3.  Aortic Atherosclerosis (ICD10-I70.0). Electronically Signed   By: Elon Alas M.D.   On: 11/21/2018 17:19   Dg Chest Port 1 View  Result Date: 11/12/2018 CLINICAL DATA:  Short of breath EXAM: PORTABLE CHEST 1 VIEW COMPARISON:  08/02/2018 FINDINGS: The heart remains moderately enlarged. Dense opacity at the right base has increased likely combination of pleural fluid and consolidation. Stable small left pleural effusion. Stable vascular congestion. No pneumothorax. Stable deformity  of the peripheral right clavicle. IMPRESSION: Increasing opacity at the right lung base likely a combination of pleural fluid and consolidation. Electronically Signed   By: Marybelle Killings M.D.   On: 11/12/2018 16:49   Vas [place] Burnard Bunting With/wo Tbi  Result Date: 11/24/2018 LOWER EXTREMITY DOPPLER STUDY Indications: Cold foot.  Performing Technologist: Carlos Levering RVT  Examination Guidelines: A complete evaluation includes at minimum, Doppler waveform signals and systolic blood pressure reading at the level of bilateral brachial, anterior tibial, and posterior tibial arteries, when vessel segments are accessible. Bilateral testing is considered an integral part of a complete examination. Photoelectric Plethysmograph (PPG) waveforms and toe systolic pressure readings are included as required and additional duplex testing as needed. Limited examinations for reoccurring indications may be performed as noted.  ABI Findings: +---------+------------------+-----+---------+--------+ Right    Rt Pressure (mmHg)IndexWaveform Comment  +---------+------------------+-----+---------+--------+ Brachial 124                    triphasic         +---------+------------------+-----+---------+--------+ PTA      254               1.91 biphasic          +---------+------------------+-----+---------+--------+ DP       254                1.91 biphasic          +---------+------------------+-----+---------+--------+ Great Toe97                0.73                   +---------+------------------+-----+---------+--------+ +---------+------------------+-----+---------+-------------------+ Left     Lt Pressure (mmHg)IndexWaveform Comment             +---------+------------------+-----+---------+-------------------+ Brachial 133                    triphasic                    +---------+------------------+-----+---------+-------------------+ PTA      248               1.86 biphasic Flow reversal noted +---------+------------------+-----+---------+-------------------+ DP       254               1.91 biphasic                     +---------+------------------+-----+---------+-------------------+ Great Toe109               0.82                              +---------+------------------+-----+---------+-------------------+ +-------+-----------+-----------+------------+------------+ ABI/TBIToday's ABIToday's TBIPrevious ABIPrevious TBI +-------+-----------+-----------+------------+------------+ Right             0.73                                +-------+-----------+-----------+------------+------------+ Left              0.82                                +-------+-----------+-----------+------------+------------+  Summary: Right: Resting right ankle-brachial index indicates noncompressible right lower extremity arteries.The right toe-brachial index is normal. Left: Resting left ankle-brachial index indicates noncompressible left lower extremity arteries.The left toe-brachial index is  normal.  *See table(s) above for measurements and observations.  Electronically signed by Harold Barban MD on 11/24/2018 at 6:00:32 PM.   Final    Ir Thoracentesis Asp Pleural Space W/img Guide  Result Date: 11/22/2018 CLINICAL DATA:  Increasing right lateral pleural opacity with suggestion of free-flowing component on  decubitus radiograph. EXAM: EXAM THORACENTESIS WITH ULTRASOUND TECHNIQUE: The procedure, risks (including but not limited to bleeding, infection, organ damage ), benefits, and alternatives were explained to the patient. Questions regarding the procedure were encouraged and answered. The patient understands and consents to the procedure. Survey ultrasound of the right hemithorax was performed and an appropriate skin entry site was localized. Site was marked, prepped with Betadine, draped in usual sterile fashion, infiltrated locally with 1% lidocaine. The Safe-T-Centesis needle was advanced into the pleural space. No significant fluid returned. Under ultrasound guidance, the pocket was again localized under real-time ultrasound guidance, the catheter advanced again into the pleural process, but no fluid could be aspirated. The catheter was removed. The patient tolerated the procedure well. COMPLICATIONS: COMPLICATIONS None immediate IMPRESSION: 1. Attempted ultrasound-guided right thoracentesis, with no return of fluid after 2 attempts. Consider CT chest for further characterization of the pleural process. Electronically Signed   By: Lucrezia Europe M.D.   On: 11/22/2018 10:50     PERTINENT LAB RESULTS: CBC: Recent Labs    11/23/18 0433 11/24/18 0349  WBC 12.8* 11.1*  HGB 12.4* 12.8*  HCT 39.5 40.1  PLT 223 256   CMET CMP     Component Value Date/Time   NA 136 11/24/2018 0349   NA 139 11/19/2018 1343   NA 138 05/06/2017 0812   K 3.9 11/24/2018 0349   K 4.7 05/06/2017 0812   CL 95 (L) 11/24/2018 0349   CO2 32 11/24/2018 0349   CO2 28 05/06/2017 0812   GLUCOSE 72 11/24/2018 0349   GLUCOSE 99 05/06/2017 0812   BUN 33 (H) 11/24/2018 0349   BUN 35 11/19/2018 1343   BUN 19.5 05/06/2017 0812   CREATININE 1.39 (H) 11/24/2018 0349   CREATININE 1.3 05/06/2017 0812   CALCIUM 8.9 11/24/2018 0349   CALCIUM 9.5 05/06/2017 0812   PROT 6.4 (L) 11/23/2018 0433   PROT 6.8 05/06/2017 0812   ALBUMIN 1.9  (L) 11/23/2018 0433   ALBUMIN 3.0 (L) 05/06/2017 0812   AST 34 11/23/2018 0433   AST 46 (H) 05/06/2017 0812   ALT 28 11/23/2018 0433   ALT 31 05/06/2017 0812   ALKPHOS 120 11/23/2018 0433   ALKPHOS 84 05/06/2017 0812   BILITOT 1.2 11/23/2018 0433   BILITOT 0.97 05/06/2017 0812   GFRNONAA 44 (L) 11/24/2018 0349   GFRNONAA 42 (L) 04/20/2013 1639   GFRAA 51 (L) 11/24/2018 0349   GFRAA 48 (L) 04/20/2013 1639    GFR Estimated Creatinine Clearance: 35.3 mL/min (A) (by C-G formula based on SCr of 1.39 mg/dL (H)). No results for input(s): LIPASE, AMYLASE in the last 72 hours. No results for input(s): CKTOTAL, CKMB, CKMBINDEX, TROPONINI in the last 72 hours. Invalid input(s): POCBNP No results for input(s): DDIMER in the last 72 hours. No results for input(s): HGBA1C in the last 72 hours. No results for input(s): CHOL, HDL, LDLCALC, TRIG, CHOLHDL, LDLDIRECT in the last 72 hours. No results for input(s): TSH, T4TOTAL, T3FREE, THYROIDAB in the last 72 hours.  Invalid input(s): FREET3 No results for input(s): VITAMINB12, FOLATE, FERRITIN, TIBC, IRON, RETICCTPCT in the last 72 hours. Coags: No results for input(s): INR in the last 72 hours.  Invalid input(s): PT Microbiology: Recent Results (from the past 240 hour(s))  Culture, blood (Routine x 2)     Status: None (Preliminary result)   Collection Time: 11/21/18  4:13 PM  Result Value Ref Range Status   Specimen Description BLOOD BLOOD RIGHT FOREARM  Final   Special Requests   Final    BOTTLES DRAWN AEROBIC AND ANAEROBIC Blood Culture adequate volume   Culture   Final    NO GROWTH 4 DAYS Performed at [facility], [address], [place], [place] [address]    Report Status PENDING  Incomplete  Culture, blood (Routine x 2)     Status: None (Preliminary result)   Collection Time: 11/21/18  4:18 PM  Result Value Ref Range Status   Specimen Description BLOOD BLOOD LEFT FOREARM  Final   Special Requests   Final    BOTTLES DRAWN  AEROBIC AND ANAEROBIC Blood Culture adequate volume   Culture   Final    NO GROWTH 4 DAYS Performed at [facility], [place] [address] [place], [place] [address]    Report Status PENDING  Incomplete    FURTHER DISCHARGE INSTRUCTIONS:  Get Medicines reviewed and adjusted: Please take all your medications with you for your next visit with your Primary MD  Laboratory/radiological data: Please request your Primary MD to go over all hospital tests and procedure/radiological results at the follow up, please ask your Primary MD to get all Hospital records sent to his/her office.  In some cases, they will be blood work, cultures and biopsy results pending at the time of your discharge. Please request that your primary care M.D. goes through all the records of your hospital data and follows up on these results.  Also Note the following: If you experience worsening of your admission symptoms, develop shortness of breath, life threatening emergency, suicidal or homicidal thoughts you must seek medical attention immediately by calling 911 or calling your MD immediately  if symptoms less severe.  You must read complete instructions/literature along with all the possible adverse reactions/side effects for all the Medicines you take and that have been prescribed to you. Take any new Medicines after you have completely understood and accpet all the possible adverse reactions/side effects.   Do not drive when taking Pain medications or sleeping medications (Benzodaizepines)  Do not take more than prescribed Pain, Sleep and Anxiety Medications. It is not advisable to combine anxiety,sleep and pain medications without talking with your primary care practitioner  Special Instructions: If you have smoked or chewed Tobacco  in the last 2 yrs please stop smoking, stop any regular Alcohol  and or any Recreational drug use.  Wear Seat belts while driving.  Please note: You were cared for by a hospitalist  during your hospital stay. Once you are discharged, your primary care physician will handle any further medical issues. Please note that NO REFILLS for any discharge medications will be authorized once you are discharged, as it is imperative that you return to your primary care physician (or establish a relationship with a primary care physician if you do not have one) for your post hospital discharge needs so that they can reassess your need for medications and monitor your lab values.  Total Time spent coordinating discharge including counseling, education and face to face time equals 35 minutes.  SignedOren Binet 11/25/2018 9:42 AM

## 2018-11-25 NOTE — Care Management Important Message (Signed)
Important Message  Patient Details  Name: Arthur [name] MRN: [id number] Date of Birth: Nov 08, 1925   Medicare Important Message Given:  Yes  Patient could not understand so he did not want to sign.  Unsigned copy was left with the patient.  Elaine Roanhorse 11/25/2018, 12:51 PM

## 2018-11-26 ENCOUNTER — Telehealth: Payer: Self-pay | Admitting: *Deleted

## 2018-11-26 LAB — CULTURE, BLOOD (ROUTINE X 2)
Culture: NO GROWTH
Culture: NO GROWTH
SPECIAL REQUESTS: ADEQUATE
Special Requests: ADEQUATE

## 2018-11-26 NOTE — Telephone Encounter (Signed)
Transition Care Management Follow-up Telephone Call   Date discharged? 11/25/18   How have you been since you were released from the hospital? Spoke w/pt wife she states pt seem to be doing fine   Do you understand why you were in the hospital? YES   Do you understand the discharge instructions? YES   Where were you discharged to? Home   Items Reviewed:  Medications reviewed: YES  Allergies reviewed: YES  Dietary changes reviewed: YES, heart healthy  Referrals reviewed: YES, wife confirm both cardiology and pulmonology appt that has been made   Functional Questionnaire:   Activities of Daily Living (ADLs):   She states he are independent in the following: ambulation, bathing and hygiene, feeding, continence, grooming, toileting and dressing States he doesn't require assistance    Any transportation issues/concerns?: NO   Any patient concerns? NO   Confirmed importance and date/time of follow-up visits scheduled YES, appt 11/30/18  Provider Appointment booked with Dr. Jenny Reichmann  Confirmed with patient if condition begins to worsen call PCP or go to the ER.  Patient was given the office number and encouraged to call back with question or concerns.  : YES

## 2018-11-29 ENCOUNTER — Telehealth: Payer: Self-pay | Admitting: Internal Medicine

## 2018-11-29 NOTE — Telephone Encounter (Signed)
[name] signed parking placard information.  Santiago Glad notified. Placard placed at front desk for Santiago Glad to pick up at her convenience.

## 2018-11-29 NOTE — Telephone Encounter (Signed)
Verbal orders given via VM 

## 2018-11-29 NOTE — Telephone Encounter (Signed)
Verbal orders given  

## 2018-11-29 NOTE — Telephone Encounter (Signed)
Copied from [place] [phone]. Topic: Quick Communication - Home Health Verbal Orders >> Nov 29, 2018 10:12 AM Carolyn Stare wrote: Caller/Agency   [place] Number  [phone]  [id number] verbal orders for Skilled Nursing   Frequency   1 x 1   2 x 3    1 x 5

## 2018-11-29 NOTE — Telephone Encounter (Signed)
Copied from [place] [phone]. Topic: Quick Communication - Home Health Verbal Orders >> Nov 29, 2018  3:44 PM Valla Leaver wrote: Caller/Agency: [facility] Number: [phone] Requesting OT/PT/Skilled Nursing/Social Work: PT Frequency: 2wk 2, 1wk 2

## 2018-11-30 ENCOUNTER — Ambulatory Visit (INDEPENDENT_AMBULATORY_CARE_PROVIDER_SITE_OTHER): Payer: Medicare Other | Admitting: Internal Medicine

## 2018-11-30 ENCOUNTER — Ambulatory Visit (INDEPENDENT_AMBULATORY_CARE_PROVIDER_SITE_OTHER)
Admission: RE | Admit: 2018-11-30 | Discharge: 2018-11-30 | Disposition: A | Discharge status: Home / Self Care | Payer: Medicare Other | Source: Ambulatory Visit | Attending: Internal Medicine | Admitting: Internal Medicine

## 2018-11-30 ENCOUNTER — Telehealth: Payer: Self-pay | Admitting: Internal Medicine

## 2018-11-30 ENCOUNTER — Encounter: Payer: Self-pay | Admitting: Internal Medicine

## 2018-11-30 ENCOUNTER — Telehealth: Payer: Self-pay

## 2018-11-30 ENCOUNTER — Other Ambulatory Visit (INDEPENDENT_AMBULATORY_CARE_PROVIDER_SITE_OTHER): Payer: Medicare Other

## 2018-11-30 VITALS — BP 118/68 | HR 66 | Temp 97.4°F | Ht 72.0 in | Wt 160.0 lb

## 2018-11-30 DIAGNOSIS — R739 Hyperglycemia, unspecified: Secondary | ICD-10-CM | POA: Diagnosis not present

## 2018-11-30 DIAGNOSIS — I5033 Acute on chronic diastolic (congestive) heart failure: Secondary | ICD-10-CM

## 2018-11-30 DIAGNOSIS — I7121 Aneurysm of the ascending aorta, without rupture: Secondary | ICD-10-CM

## 2018-11-30 DIAGNOSIS — G4700 Insomnia, unspecified: Secondary | ICD-10-CM

## 2018-11-30 DIAGNOSIS — K5900 Constipation, unspecified: Secondary | ICD-10-CM

## 2018-11-30 DIAGNOSIS — I712 Thoracic aortic aneurysm, without rupture: Secondary | ICD-10-CM | POA: Diagnosis not present

## 2018-11-30 DIAGNOSIS — J90 Pleural effusion, not elsewhere classified: Secondary | ICD-10-CM | POA: Diagnosis not present

## 2018-11-30 LAB — CBC WITH DIFFERENTIAL/PLATELET
Basophils Absolute: 0 10*3/uL (ref 0.0–0.1)
Basophils Relative: 0.3 % (ref 0.0–3.0)
Eosinophils Absolute: 0 10*3/uL (ref 0.0–0.7)
Eosinophils Relative: 0.1 % (ref 0.0–5.0)
HCT: 40.8 % (ref 39.0–52.0)
Hemoglobin: 13.4 g/dL (ref 13.0–17.0)
Lymphocytes Relative: 13.5 % (ref 12.0–46.0)
Lymphs Abs: 2.3 10*3/uL (ref 0.7–4.0)
MCHC: 32.9 g/dL (ref 30.0–36.0)
MCV: 98.6 fl (ref 78.0–100.0)
Monocytes Absolute: 1.6 10*3/uL — ABNORMAL HIGH (ref 0.1–1.0)
Monocytes Relative: 9.5 % (ref 3.0–12.0)
Neutro Abs: 12.9 10*3/uL — ABNORMAL HIGH (ref 1.4–7.7)
Neutrophils Relative %: 76.6 % (ref 43.0–77.0)
Platelets: 313 10*3/uL (ref 150.0–400.0)
RBC: 4.13 Mil/uL — ABNORMAL LOW (ref 4.22–5.81)
RDW: 15.6 % — ABNORMAL HIGH (ref 11.5–15.5)
WBC: 16.8 10*3/uL — AB (ref 4.0–10.5)

## 2018-11-30 LAB — BASIC METABOLIC PANEL
BUN: 35 mg/dL — ABNORMAL HIGH (ref 6–23)
CO2: 32 mEq/L (ref 19–32)
Calcium: 9.4 mg/dL (ref 8.4–10.5)
Chloride: 94 mEq/L — ABNORMAL LOW (ref 96–112)
Creatinine, Ser: 1.19 mg/dL (ref 0.40–1.50)
GFR: 60.83 mL/min (ref >60.00)
Glucose, Bld: 83 mg/dL (ref 70–99)
Potassium: 3.9 mEq/L (ref 3.5–5.1)
Sodium: 138 mEq/L (ref 135–145)

## 2018-11-30 LAB — HEPATIC FUNCTION PANEL
ALT: 20 U/L (ref 0–53)
AST: 27 U/L (ref 0–37)
Albumin: 3.1 g/dL — ABNORMAL LOW (ref 3.5–5.2)
Alkaline Phosphatase: 147 U/L — ABNORMAL HIGH (ref 39–117)
Bilirubin, Direct: 0.4 mg/dL — ABNORMAL HIGH (ref 0.0–0.3)
TOTAL PROTEIN: 8 g/dL (ref 6.0–8.3)
Total Bilirubin: 0.8 mg/dL (ref 0.2–1.2)

## 2018-11-30 NOTE — Telephone Encounter (Signed)
Copied from [place] [phone]. Topic: Quick Communication - Home Health Verbal Orders >> Nov 30, 2018  2:57 PM Berneta Levins wrote: Caller/Agency: [facility] Newcomer with [facility] Number: [phone], OK to leave a message Requesting OT/PT/Skilled Nursing/Social Work: OT Frequency: 2x a week 2 weeks, 1 x a week for 2 weeks

## 2018-11-30 NOTE — Assessment & Plan Note (Signed)
Ok for [employer]

## 2018-11-30 NOTE — Telephone Encounter (Signed)
Copied from [place] [phone]. Topic: Quick Communication - Home Health Verbal Orders >> Nov 30, 2018  1:21 PM Adelene Idler wrote: Caller/Agency: [employer] Number: [phone] Requesting OT/PT/Skilled Nursing/Social Work: PT and OT Frequency:2 x week 2 , 1 week 2 for PT and OT

## 2018-11-30 NOTE — Telephone Encounter (Signed)
Form was completed and charged for correct? I think I brought this to you. I will reach out to pt's daughter after confirmation.   Copied from [place] [phone]. Topic: General - Other >> Nov 30, 2018  3:16 PM Wynetta Emery, [place] C wrote: Reason for CRM: pt's daughter is calling in to check the status of the pt's FL2 form. Daughter says that pt was just seen, she asked about form but she states that PCP couldn't recall if he have completed a form. Social worker advised pt/daughter that form was re-faxed on yesterday.   Please advise.    [phone]

## 2018-11-30 NOTE — Assessment & Plan Note (Signed)
Improved,  to f/u any worsening symptoms or concerns 

## 2018-11-30 NOTE — Progress Notes (Signed)
Subjective:    Patient ID: Arthur [name], male    DOB: [birth year], 85 y.o.   MRN: [id number]  HPI here to f/u post hospn x 2 with daughter here for first time and wife, first hospn with chf, then pna with right pleural effusion loculated from 1/5 to 1/9; 85 y.o.malewith history of chronic diastolic heart failure, atrial fibrillation on Eliquis, hypertension who presented with shortness of breath, fever-he was thought to have sepsis secondary to pneumonia and a loculated right-sided pleural effusion.  Sepsis pathophysiology has improved-cultures negative so far-however urine pneumococcal antigen positive-treated with Rocephin.  Since clinically improved-with complete resolution of sepsis pathophysiology-he is considered stable to be discharged home-we will transition him to [facility] on discharge.  Outpatient follow-up with pulmonologist arranged-if felt that he needs additional antimicrobial therapy-this can be prolonged by his pulmonologist.  Thoracocentesis attempted by IR but unsuccessful. Prior MD-spoke with Dr. Janean Sark advanced age and frailty-recommendations are to complete at least 2-3 weeks of antimicrobial therapy and follow-up in the outpatient setting-appointment with Dr. Elsworth Soho scheduled for 1/16 at 2:30 PM.  Patient has had chronic recurrent right-sided pleural effusion in the past-and has seen cardiothoracic surgery as well. Pt was recommended for f/u here with labs, and has appt with pulmonary for jan 16.  Also Incidentally found to have an ascending aortic aneurysm-requires semiannual imaging by CTA or MRA-defer to PCP.  Today family and pt report overall improvement though stamina slow in returning and started PT today at home.  They are working towards rehab and Fl2 to be forwarded.  Good compliance with finishing omnicef, has 3 more days.  Denies fever, cough, hemoptysis , worsening sob/doe, or cp.  No new complaints except for intermittent constipation last few days, and diffuclty  getting to sleep. No falls Past Medical History:  Diagnosis Date  . Arrhythmia    1995  . Atrial fibrillation ([place])   . CHF (congestive heart failure) ([place]) 10/13/2016  . Coronary artery disease    MI, atherectomy 1993  . DDD (degenerative disc disease)   . Eczema   . Elevated LFTs   . History of gallstones   . History of transient ischemic attack (TIA) 2005   double vision  . Hyperlipidemia   . Hypertension   . Pneumonia    long time ago  . Skin cancer    melanoma in remission  . Stroke [facility])    Past Surgical History:  Procedure Laterality Date  . AMPUTATION Right 05/10/2013   Procedure: RIGHT SECOND TOE AMPUTATION;  Surgeon: Hessie Dibble, MD;  Location: [facility];  Service: Orthopedics;  Laterality: Right;  . APPENDECTOMY    . CARDIAC CATHETERIZATION  04/08/99   LAD 70% INTER 40-50% RCA 30-40%  . CHEST TUBE INSERTION Right 02/16/2017   Procedure: INSERTION PLEURAL DRAINAGE CATHETER;  Surgeon: Grace Isaac, MD;  Location: [facility];  Service: Thoracic;  Laterality: Right;  . CORONARY ANGIOPLASTY  04/08/99   LAD  . CORONARY ANGIOPLASTY  10/17/97   RCA   . CORONARY ANGIOPLASTY WITH STENT PLACEMENT  04/08/99   LAD  . IR THORACENTESIS ASP PLEURAL SPACE W/IMG GUIDE  11/22/2018  . MELANOMA EXCISION  2013   lt arm  . MELANOMA EXCISION Left 08/23/2013   Procedure: MELANOMA EXCISION;  Surgeon: Pedro Earls, MD;  Location: [facility];  Service: General;  Laterality: Left;  . REMOVAL OF PLEURAL DRAINAGE CATHETER Right 05/07/2017   Procedure: REMOVAL OF PLEURAL DRAINAGE CATHETER;  Surgeon: Grace Isaac,  MD;  Location: [facility];  Service: Thoracic;  Laterality: Right;  . TONSILLECTOMY    . VIDEO BRONCHOSCOPY Bilateral 05/07/2015   Procedure: VIDEO BRONCHOSCOPY WITHOUT FLUORO;  Surgeon: Rigoberto Noel, MD;  Location: [facility];  Service: Cardiopulmonary;  Laterality: Bilateral;    reports that he quit smoking about 51 years ago. His smoking use included cigars  and pipe. He quit after 7.00 years of use. He has never used smokeless tobacco. He reports current alcohol use of about 1.0 standard drinks of alcohol per week. He reports that he does not use drugs. family history includes Heart attack (age of onset: 34) in his mother; Pneumonia in his father. Allergies  Allergen Reactions  . Penicillins Hives    Has patient had a PCN reaction causing immediate rash, facial/tongue/throat swelling, SOB or lightheadedness with hypotension:Yes Has patient had a PCN reaction causing severe rash involving mucus membranes or skin necrosis: No Has patient had a PCN reaction that required hospitalization: No Has patient had a PCN reaction occurring within the last 10 years: No If all of the above answers are "NO", then may proceed with Cephalosporin use.    Current Outpatient Medications on File Prior to Visit  Medication Sig Dispense Refill  . Albuterol Sulfate (PROAIR RESPICLICK) 147 (90 Base) MCG/ACT AEPB Inhale 1 puff into the lungs every 4 (four) hours as needed (SOB). 30 each 0  . apixaban (ELIQUIS) 2.5 MG TABS tablet Take 1 tablet (2.5 mg total) by mouth 2 (two) times daily. 60 tablet 5  . Ascorbic Acid (VITAMIN C PO) Take 1 tablet by mouth daily.    . benzonatate (TESSALON) 100 MG capsule Take 1 capsule (100 mg total) by mouth 3 (three) times daily as needed for cough. 20 capsule 0  . cefdinir (OMNICEF) 300 MG capsule Take 1 capsule (300 mg total) by mouth every 12 (twelve) hours. 16 capsule 0  . Cholecalciferol (VITAMIN D3) 5000 units CAPS Take 5,000 Units by mouth daily.    . furosemide (LASIX) 20 MG tablet Take 1 tablet (20 mg total) by mouth every evening. 30 tablet 0  . furosemide (LASIX) 40 MG tablet Take 1 tablet (40 mg total) by mouth every morning. 30 tablet 0  . guaiFENesin (MUCINEX) 600 MG 12 hr tablet Take 1 tablet (600 mg total) by mouth 2 (two) times daily. 10 tablet 0  . metoprolol tartrate (LOPRESSOR) 25 MG tablet Take 0.5 tablets (12.5 mg  total) by mouth 2 (two) times daily. 60 tablet 0  . predniSONE (DELTASONE) 5 MG tablet Take 2 tablets (10 mg total) by mouth daily with breakfast. 100 tablet 3  . VITAMIN A PO Take 1 capsule by mouth daily.     No current facility-administered medications on file prior to visit.    Review of Systems   Constitutional: Negative for other unusual diaphoresis or sweats HENT: Negative for ear discharge or swelling Eyes: Negative for other worsening visual disturbances Respiratory: Negative for stridor or other swelling  Gastrointestinal: Negative for worsening distension or other blood Genitourinary: Negative for retention or other urinary change Musculoskeletal: Negative for other MSK pain or swelling Skin: Negative for color change or other new lesions Neurological: Negative for worsening tremors and other numbness  Psychiatric/Behavioral: Negative for worsening agitation or other fatigue All other system neg per pt    Objective:   Physical Exam BP 118/68   Pulse 66   Temp (!) 97.4 F (36.3 C) (Oral)   Ht 6' (1.829 m)   Wt  160 lb (72.6 kg)   SpO2 95%   BMI 21.70 kg/m  VS noted, non toxic, appears somewhat confused with limited comprehension and cannot understand difference btwn pna and loculated effusion Constitutional: Pt appears in NAD HENT: Head: NCAT.  Right Ear: External ear normal.  Left Ear: External ear normal.  Eyes: . Pupils are equal, round, and reactive to light. Conjunctivae and EOM are normal Nose: without d/c or deformity Neck: Neck supple. Gross normal ROM Cardiovascular: Normal rate and irregular rhythm.   Pulmonary/Chest: Effort normal and breath sounds without rales or wheezing. except for marked decreased BS at the right base Abd:  Soft, NT, ND, + BS, no organomegaly Neurological: Pt is alert. At baseline orientation, motor grossly intact Skin: Skin is warm. No rashes, other new lesions, no LE edema Psychiatric: Pt behavior is normal without agitation  No  other exam findings Lab Results  Component Value Date   WBC 16.8 (H) 11/30/2018   HGB 13.4 11/30/2018   HCT 40.8 11/30/2018   PLT 313.0 11/30/2018   GLUCOSE 83 11/30/2018   CHOL 192 08/18/2016   TRIG 93.0 08/18/2016   HDL 58.10 08/18/2016   LDLCALC 115 (H) 08/18/2016   ALT 20 11/30/2018   AST 27 11/30/2018   NA 138 11/30/2018   K 3.9 11/30/2018   CL 94 (L) 11/30/2018   CREATININE 1.19 11/30/2018   BUN 35 (H) 11/30/2018   CO2 32 11/30/2018   TSH 1.45 08/18/2016   PSA 5.69 (H) 10/09/2009   INR 1.38 11/21/2018   HGBA1C 5.9 (H) 02/01/2016         Assessment & Plan:

## 2018-11-30 NOTE — Patient Instructions (Signed)
OK to use neosporin or vaseline with gauze covering for the right wrist   OK to use Duoderm to help prevent the bedsore from getting any worse  OK to take OTC Miralax for constipation, and Melatonin for sleep  Please continue all other medications as before, including finishing the antibiotic  Please have the pharmacy call with any other refills you may need.  Please continue your efforts at being more active, low cholesterol diet, and weight control.  You are otherwise up to date with prevention measures today.  Please keep your appointments with your specialists as you may have planned - Dr Elsworth Soho on Thursday  Please remember we will need to at least consider a repeat CTA (CT scan) of the ascending aortic aneurysm in 6 months, if it still seems like a good idea at the time  Please go to the XRAY Department in the Basement (go straight as you get off the elevator) for the x-ray testing  Please go to the LAB in the Basement (turn left off the elevator) for the tests to be done today  You will be contacted by phone if any changes need to be made immediately.  Otherwise, you will receive a letter about your results with an explanation, but please check with MyChart first.  Please remember to sign up for MyChart if you have not done so, as this will be important to you in the future with finding out test results, communicating by private email, and scheduling acute appointments online when needed.  Please return in 6 months, or sooner if needed

## 2018-11-30 NOTE — Assessment & Plan Note (Signed)
For f/u at 6 mo for repeat CTA

## 2018-11-30 NOTE — Assessment & Plan Note (Addendum)
With loculation on right, for contd same tx, cxr today and cbc, bmp, f/u pulm as planned

## 2018-11-30 NOTE — Telephone Encounter (Signed)
Routing to dr Jenny Reichmann, please advise, I will call home health back, thanks

## 2018-11-30 NOTE — Assessment & Plan Note (Signed)
stable overall by history and exam, recent data reviewed with pt, and pt to continue medical treatment as before,  to f/u any worsening symptoms or concerns  

## 2018-11-30 NOTE — Telephone Encounter (Signed)
Ok for verbals 

## 2018-11-30 NOTE — Assessment & Plan Note (Signed)
Ok for otc melatonin

## 2018-11-30 NOTE — Telephone Encounter (Signed)
Verbal orders given  

## 2018-12-01 NOTE — Telephone Encounter (Signed)
Form has been faxed.

## 2018-12-01 NOTE — Telephone Encounter (Signed)
Yes this was one that you brought to me yesterday. He just had to sign to the form, it came completed.

## 2018-12-01 NOTE — Telephone Encounter (Signed)
Cindy with [facility] calling to request [facility] form faxed to [phone].

## 2018-12-01 NOTE — Telephone Encounter (Signed)
Left message advising susan/[facility] of ok verbal orders from dr Jenny Reichmann, call back if any further questions

## 2018-12-01 NOTE — Telephone Encounter (Signed)
Pt's daughter has been informed.  

## 2018-12-02 ENCOUNTER — Ambulatory Visit (INDEPENDENT_AMBULATORY_CARE_PROVIDER_SITE_OTHER): Payer: Medicare Other | Admitting: Cardiovascular Disease

## 2018-12-02 ENCOUNTER — Encounter: Payer: Self-pay | Admitting: Cardiovascular Disease

## 2018-12-02 ENCOUNTER — Encounter: Payer: Self-pay | Admitting: Pulmonary Disease

## 2018-12-02 ENCOUNTER — Ambulatory Visit (INDEPENDENT_AMBULATORY_CARE_PROVIDER_SITE_OTHER): Payer: Medicare Other | Admitting: Pulmonary Disease

## 2018-12-02 VITALS — Ht 72.0 in | Wt 160.8 lb

## 2018-12-02 VITALS — BP 118/70 | HR 63 | Ht 72.0 in | Wt 161.0 lb

## 2018-12-02 DIAGNOSIS — I712 Thoracic aortic aneurysm, without rupture, unspecified: Secondary | ICD-10-CM

## 2018-12-02 DIAGNOSIS — I350 Nonrheumatic aortic (valve) stenosis: Secondary | ICD-10-CM

## 2018-12-02 DIAGNOSIS — K754 Autoimmune hepatitis: Secondary | ICD-10-CM | POA: Diagnosis not present

## 2018-12-02 DIAGNOSIS — I5032 Chronic diastolic (congestive) heart failure: Secondary | ICD-10-CM

## 2018-12-02 DIAGNOSIS — I4821 Permanent atrial fibrillation: Secondary | ICD-10-CM

## 2018-12-02 DIAGNOSIS — J90 Pleural effusion, not elsewhere classified: Secondary | ICD-10-CM

## 2018-12-02 DIAGNOSIS — I2510 Atherosclerotic heart disease of native coronary artery without angina pectoris: Secondary | ICD-10-CM | POA: Diagnosis not present

## 2018-12-02 DIAGNOSIS — I5033 Acute on chronic diastolic (congestive) heart failure: Secondary | ICD-10-CM

## 2018-12-02 MED ORDER — CEFDINIR 300 MG PO CAPS: 300.0000 mg | ORAL_CAPSULE | Freq: Two times a day (BID) | ORAL | 0 refills | Status: DC

## 2018-12-02 NOTE — Assessment & Plan Note (Addendum)
Omnicef 300 mg twice daily for 3 more weeks Repeat chest x-ray in 3 weeks Loculated right effusion is concerning for infection but he is not a candidate for surgery given his multiple medical issues and frailty.  Since he has failed thoracenteses I did not see a point in attempting this again.  Will treat with antibiotics for longer duration, expect the space to eventually scar up although he may still continue to have residual fluid due to his liver and heart failure issues  Repeat chest x-ray in 2 to 3 weeks, sooner if he develops fever or dyspnea

## 2018-12-02 NOTE — Assessment & Plan Note (Signed)
Dosing of Lasix based on weight, and aim for 160 pounds or lower Continue current dosing of Lasix 40 mg morning and 20 mg p.m. Salt restriction advised

## 2018-12-02 NOTE — Progress Notes (Signed)
Subjective:    Patient ID: Arthur [name], male    DOB: 03/22/1925, 85 y.o.   MRN: [id number]  HPI  Chief Complaint  Patient presents with  . Hospitalization Follow-up    H/FU. Saw RA in hospital 1/5-1/9 for right pleural effusion. Former [facility] patient. Per family, he is slowly starting to feel better. Still weak.      85 y.o.malewith history of chronic diastolic heart failure, atrial fibrillation on Eliquis, hypertension presents for evaluation of loculated right pleural effusion  He has a history of chronic diastolic heart failure and has had pleural effusions bilateral for many years on review of his prior imaging and has undergone thoracentesis in the past. He was initially admitted 12/27 for 2 days for acute on chronic diastolic heart failure, diuresed and discharged on oxygen.  He was again hospitalized 1/5 with fevers and chest x-ray showed increased right effusion with layering.  Thoracentesis was attempted by IR x2 but fluid could not be obtained. CT chest was then obtained which showed loculation of this right effusion  cultures negative -however urine pneumococcal antigen positive-treated with Rocephin And discharged on Omnicef he has 1 more day left.  Given his frailty and other medical problems, he was not considered a candidate for surgery.  He now uses a walker and does not have the uses oxygen daily.  Outpatient rehab is being considered. He had follow-up appointment with his PCP. He feels only 20% improved  Past medical history of autoimmune hepatitis for which she is maintained on prednisone daily  04/2015 episode of hemoptysis, fiberoptic bronchoscopy was negative. Echo 10/2018 shows normal LV systolic function, moderate aortic stenosis and moderate MR  Labs from 11/30/2018 were reviewed which shows WBC count increased to 16.8.  His weight is fluctuated from 161 to 165 pounds   Past Medical History:  Diagnosis Date  . Arrhythmia    1995  . Atrial  fibrillation ([place])   . CHF (congestive heart failure) ([place]) 10/13/2016  . Coronary artery disease    MI, atherectomy 1993  . DDD (degenerative disc disease)   . Eczema   . Elevated LFTs   . History of gallstones   . History of transient ischemic attack (TIA) 2005   double vision  . Hyperlipidemia   . Hypertension   . Pneumonia    long time ago  . Skin cancer    melanoma in remission  . Stroke [facility])      Past Surgical History:  Procedure Laterality Date  . AMPUTATION Right 05/10/2013   Procedure: RIGHT SECOND TOE AMPUTATION;  Surgeon: Hessie Dibble, MD;  Location: [place];  Service: Orthopedics;  Laterality: Right;  . APPENDECTOMY    . CARDIAC CATHETERIZATION  04/08/99   LAD 70% INTER 40-50% RCA 30-40%  . CHEST TUBE INSERTION Right 02/16/2017   Procedure: INSERTION PLEURAL DRAINAGE CATHETER;  Surgeon: Grace Isaac, MD;  Location: [place];  Service: Thoracic;  Laterality: Right;  . CORONARY ANGIOPLASTY  04/08/99   LAD  . CORONARY ANGIOPLASTY  10/17/97   RCA   . CORONARY ANGIOPLASTY WITH STENT PLACEMENT  04/08/99   LAD  . IR THORACENTESIS ASP PLEURAL SPACE W/IMG GUIDE  11/22/2018  . MELANOMA EXCISION  2013   lt arm  . MELANOMA EXCISION Left 08/23/2013   Procedure: MELANOMA EXCISION;  Surgeon: Pedro Earls, MD;  [facility];  Service: General;  Laterality: Left;  . REMOVAL OF PLEURAL DRAINAGE CATHETER Right 05/07/2017   Procedure: REMOVAL  OF PLEURAL DRAINAGE CATHETER;  Surgeon: Grace Isaac, MD;  Location: [place];  Service: Thoracic;  Laterality: Right;  . TONSILLECTOMY    . VIDEO BRONCHOSCOPY Bilateral 05/07/2015   Procedure: VIDEO BRONCHOSCOPY WITHOUT FLUORO;  Surgeon: Rigoberto Noel, MD;  Location: [place];  Service: Cardiopulmonary;  Laterality: Bilateral;    Allergies  Allergen Reactions  . Penicillins Hives    Has patient had a PCN reaction causing immediate rash, facial/tongue/throat swelling, SOB or lightheadedness with  hypotension:Yes Has patient had a PCN reaction causing severe rash involving mucus membranes or skin necrosis: No Has patient had a PCN reaction that required hospitalization: No Has patient had a PCN reaction occurring within the last 10 years: No If all of the above answers are "NO", then may proceed with Cephalosporin use.     Social History   Socioeconomic History  . Marital status: Married    Spouse name: Not on file  . Number of children: 6  . Years of education: Not on file  . Highest education level: Not on file  Occupational History  . Not on file  Social Needs  . Financial resource strain: Not hard at all  . Food insecurity:    Worry: Never true    Inability: Never true  . Transportation needs:    Medical: No    Non-medical: No  Tobacco Use  . Smoking status: Former Smoker    Years: 7.00    Types: Cigars, Pipe    Last attempt to quit: 11/18/1967    Years since quitting: 51.0  . Smokeless tobacco: Never Used  . Tobacco comment: 1-2 cigars a day, pipe  Substance and Sexual Activity  . Alcohol use: Yes    Alcohol/week: 1.0 standard drinks    Types: 1 Glasses of wine per week    Comment: 2-3 glasses of wine in the evening  . Drug use: No  . Sexual activity: Not Currently  Lifestyle  . Physical activity:    Days per week: 4 days    Minutes per session: 50 min  . Stress: Only a little  Relationships  . Social connections:    Talks on phone: More than three times a week    Gets together: More than three times a week    Attends religious service: More than 4 times per year    Active member of club or organization: Yes    Attends meetings of clubs or organizations: More than 4 times per year    Relationship status: Married  . Intimate partner violence:    Fear of current or ex partner: No    Emotionally abused: No    Physically abused: No    Forced sexual activity: No  Other Topics Concern  . Not on file  Social History Narrative  . Not on file      Family History  Problem Relation Age of Onset  . Heart attack Mother 49       MI  . Pneumonia Father       Review of Systems  Constitutional: Negative for fever and unexpected weight change.  HENT: Negative for congestion, dental problem, ear pain, nosebleeds, postnasal drip, rhinorrhea, sinus pressure, sneezing, sore throat and trouble swallowing.   Eyes: Negative for redness and itching.  Respiratory: Positive for cough and shortness of breath. Negative for chest tightness and wheezing.   Cardiovascular: Negative for palpitations and leg swelling.  Gastrointestinal: Negative for nausea and vomiting.  Genitourinary: Negative for dysuria.  Musculoskeletal: Positive  for joint swelling.  Skin: Negative for rash.  Allergic/Immunologic: Negative.  Negative for environmental allergies, food allergies and immunocompromised state.  Neurological: Negative for headaches.  Hematological: Does not bruise/bleed easily.  Psychiatric/Behavioral: Negative for dysphoric mood. The patient is not nervous/anxious.        Objective:   Physical Exam  Gen. Pleasant, elderly,, in no distress, normal affect ENT - no pallor,icterus, no post nasal drip, class 2-3 airway Neck: No JVD, no thyromegaly, no carotid bruits Lungs: no use of accessory muscles, no dullness to percussion, decreased on RT without rales or rhonchi  Cardiovascular: Rhythm regular, heart sounds  normal, no murmurs or gallops, 2+ peripheral edema Abdomen: soft and non-tender, no hepatosplenomegaly, BS normal. Musculoskeletal: No deformities, no cyanosis or clubbing Neuro:  alert, non focal, no tremors       Assessment & Plan:

## 2018-12-02 NOTE — Assessment & Plan Note (Signed)
Seems like this is progressed to cirrhosis now based on imaging. He has not followed up with GI Dr. Henrene Pastor for many years

## 2018-12-02 NOTE — Progress Notes (Signed)
Cardiology Office Note:    Date:  12/03/2018   ID:  Arthur [name], DOB April 18, 1925, MRN [id number]  PCP:  Biagio Borg, MD  Cardiologist:  Sherren Mocha, MD  Electrophysiologist:  None   Referring MD: Biagio Borg, MD   Chief Complaint  Patient presents with  . Shortness of Breath    History of Present Illness:    Arthur [name] is a 85 y.o. male with a hx of multiple medical problems, presenting for follow-up evaluation.  The patient has been followed for permanent atrial fibrillation, remote coronary artery disease with anteroseptal infarct in the 1990s and PCI of the LAD in 2000, and chronic diastolic heart failure.  He has chronic autoimmune hepatitis, loculated pleural effusion, and chronic leg swelling with venous insufficiency.  The patient is here with his family today.  He has been recently hospitalized with pneumonia and heart failure.  He required thoracentesis.  He has a follow-up with pulmonary as an outpatient this afternoon.  The patient has had shortness of breath.  He also has orthopnea.  He has had no recent chest pain or syncope.  He was hospitalized twice over the holidays, initially for what was thought to be acute diastolic heart failure, then he returned with fever and was diagnosed with pneumonia.  He is continued on oral antibiotics.  Past Medical History:  Diagnosis Date  . Arrhythmia    1995  . Atrial fibrillation ([place])   . CHF (congestive heart failure) ([place]) 10/13/2016  . Coronary artery disease    MI, atherectomy 1993  . DDD (degenerative disc disease)   . Eczema   . Elevated LFTs   . History of gallstones   . History of transient ischemic attack (TIA) 2005   double vision  . Hyperlipidemia   . Hypertension   . Pneumonia    long time ago  . Skin cancer    melanoma in remission  . Stroke [facility])     Past Surgical History:  Procedure Laterality Date  . AMPUTATION Right 05/10/2013   Procedure: RIGHT SECOND TOE AMPUTATION;  Surgeon: Hessie Dibble, MD;  Location: [place];  Service: Orthopedics;  Laterality: Right;  . APPENDECTOMY    . CARDIAC CATHETERIZATION  04/08/99   LAD 70% INTER 40-50% RCA 30-40%  . CHEST TUBE INSERTION Right 02/16/2017   Procedure: INSERTION PLEURAL DRAINAGE CATHETER;  Surgeon: Grace Isaac, MD;  Location: [place];  Service: Thoracic;  Laterality: Right;  . CORONARY ANGIOPLASTY  04/08/99   LAD  . CORONARY ANGIOPLASTY  10/17/97   RCA   . CORONARY ANGIOPLASTY WITH STENT PLACEMENT  04/08/99   LAD  . IR THORACENTESIS ASP PLEURAL SPACE W/IMG GUIDE  11/22/2018  . MELANOMA EXCISION  2013   lt arm  . MELANOMA EXCISION Left 08/23/2013   Procedure: MELANOMA EXCISION;  Surgeon: Pedro Earls, MD;  [facility];  Service: General;  Laterality: Left;  . REMOVAL OF PLEURAL DRAINAGE CATHETER Right 05/07/2017   Procedure: REMOVAL OF PLEURAL DRAINAGE CATHETER;  Surgeon: Grace Isaac, MD;  Location: [place];  Service: Thoracic;  Laterality: Right;  . TONSILLECTOMY    . VIDEO BRONCHOSCOPY Bilateral 05/07/2015   Procedure: VIDEO BRONCHOSCOPY WITHOUT FLUORO;  Surgeon: Rigoberto Noel, MD;  Location: [place];  Service: Cardiopulmonary;  Laterality: Bilateral;    Current Medications: Current Meds  Medication Sig  . Albuterol Sulfate (PROAIR RESPICLICK) 440 (90 Base) MCG/ACT AEPB Inhale 1 puff into the lungs every  4 (four) hours as needed (SOB).  Marland Kitchen apixaban (ELIQUIS) 2.5 MG TABS tablet Take 1 tablet (2.5 mg total) by mouth 2 (two) times daily.  . Ascorbic Acid (VITAMIN C PO) Take 1 tablet by mouth daily.  . benzonatate (TESSALON) 100 MG capsule Take 1 capsule (100 mg total) by mouth 3 (three) times daily as needed for cough.  . cefdinir (OMNICEF) 300 MG capsule Take 1 capsule (300 mg total) by mouth every 12 (twelve) hours.  . Cholecalciferol (VITAMIN D3) 5000 units CAPS Take 5,000 Units by mouth daily.  . furosemide (LASIX) 20 MG tablet Take 1 tablet (20 mg total) by mouth every evening.    . furosemide (LASIX) 40 MG tablet Take 1 tablet (40 mg total) by mouth every morning.  Marland Kitchen guaiFENesin (MUCINEX) 600 MG 12 hr tablet Take 1 tablet (600 mg total) by mouth 2 (two) times daily.  . metoprolol tartrate (LOPRESSOR) 25 MG tablet Take 0.5 tablets (12.5 mg total) by mouth 2 (two) times daily.  . predniSONE (DELTASONE) 5 MG tablet Take 2 tablets (10 mg total) by mouth daily with breakfast.  . VITAMIN A PO Take 1 capsule by mouth daily.     Allergies:   Penicillins   Social History   Socioeconomic History  . Marital status: Married    Spouse name: Not on file  . Number of children: 6  . Years of education: Not on file  . Highest education level: Not on file  Occupational History  . Not on file  Social Needs  . Financial resource strain: Not hard at all  . Food insecurity:    Worry: Never true    Inability: Never true  . Transportation needs:    Medical: No    Non-medical: No  Tobacco Use  . Smoking status: Former Smoker    Years: 7.00    Types: Cigars, Pipe    Last attempt to quit: 11/18/1967    Years since quitting: 51.0  . Smokeless tobacco: Never Used  . Tobacco comment: 1-2 cigars a day, pipe  Substance and Sexual Activity  . Alcohol use: Yes    Alcohol/week: 1.0 standard drinks    Types: 1 Glasses of wine per week    Comment: 2-3 glasses of wine in the evening  . Drug use: No  . Sexual activity: Not Currently  Lifestyle  . Physical activity:    Days per week: 4 days    Minutes per session: 50 min  . Stress: Only a little  Relationships  . Social connections:    Talks on phone: More than three times a week    Gets together: More than three times a week    Attends religious service: More than 4 times per year    Active member of club or organization: Yes    Attends meetings of clubs or organizations: More than 4 times per year    Relationship status: Married  Other Topics Concern  . Not on file  Social History Narrative  . Not on file     Family  History: The patient's family history includes Heart attack (age of onset: 37) in his mother; Pneumonia in his father.  ROS:   Please see the history of present illness.    Positive for generalized weakness, leg swelling, cough, exertional dyspnea, rash, easy bruising, constipation all other systems reviewed and are negative.  EKGs/Labs/Other Studies Reviewed:    The following studies were reviewed today: Echo 11/13/2018: Study Conclusions  - Left ventricle: There  was moderate concentric hypertrophy.   Systolic function was normal. The estimated ejection fraction was   in the range of 60% to 65%. Wall motion was normal; there were no   regional wall motion abnormalities. - Aortic valve: Right coronary and left coronary cusp mobility was   severely restricted. There was moderate stenosis. There was   trivial regurgitation. Valve area (VTI): 1.01 cm^2. Valve area   (Vmax): 1.04 cm^2. Valve area (Vmean): 1.07 cm^2. - Mitral valve: Calcified annulus. Mildly thickened leaflets .   There was mild to moderate regurgitation directed eccentrically. - Left atrium: The atrium was severely dilated. - Right atrium: The atrium was severely dilated.  EKG:  EKG is not ordered today.    Recent Labs: 08/20/2018: NT-Pro BNP 4,156 11/14/2018: Magnesium 2.0 11/21/2018: B Natriuretic Peptide 902.1 11/30/2018: ALT 20; BUN 35; Creatinine, Ser 1.19; Hemoglobin 13.4; Platelets 313.0; Potassium 3.9; Sodium 138   Recent Lipid Panel    Component Value Date/Time   CHOL 192 08/18/2016 1534   TRIG 93.0 08/18/2016 1534   HDL 58.10 08/18/2016 1534   CHOLHDL 3 08/18/2016 1534   VLDL 18.6 08/18/2016 1534   LDLCALC 115 (H) 08/18/2016 1534    Physical Exam:    VS:  Ht 6' (1.829 m)   Wt 160 lb 12.8 oz (72.9 kg)   BMI 21.81 kg/m     Wt Readings from Last 3 Encounters:  12/02/18 161 lb (73 kg)  12/02/18 160 lb 12.8 oz (72.9 kg)  11/30/18 160 lb (72.6 kg)     GEN: elderly, frail male in no acute  distress HEENT: Normal NECK: No JVD; No carotid bruits LYMPHATICS: No lymphadenopathy CARDIAC: RRR with 2/6 harsh mid peaking systolic murmur at the RUSB RESPIRATORY:  Diminished breath sounds in the bases ABDOMEN: Soft, non-tender, non-distended MUSCULOSKELETAL:  1+ pretibial edema; No deformity  SKIN: Warm and dry NEUROLOGIC:  Alert and oriented x 3 PSYCHIATRIC:  Normal affect   ASSESSMENT:    1. Chronic diastolic congestive heart failure ([place])   2. Permanent atrial fibrillation   3. Coronary artery disease involving native coronary artery of native heart without angina pectoris   4. Thoracic aortic aneurysm without rupture ([place])   5. Aortic valve stenosis, etiology of cardiac valve disease unspecified    PLAN:    In order of problems listed above:  1. The patient appears to be euvolemic on exam.  He has a component of chronic edema from venous insufficiency.  His neck veins appear flat today.  I recommended continuing his current furosemide dose. 2. He is tolerating dose adjusted apixaban based on his advanced age and creatinine clearance.  Continue metoprolol. 3. No anginal symptoms at present.  We will continue with observation. 4. We discussed the fact that he has a four-point centimeter thoracic aortic aneurysm.  He understands that this is a small aneurysm and he should have surveillance, but it is unlikely that that will grow to an extent that he would require consideration for surgery, and it would be more unlikely that he would be a candidate for surgery. 5. His aortic stenosis is moderate by exam and echo findings.  Will repeat an echo study in 1 year.   Medication Adjustments/Labs and Tests Ordered: Current medicines are reviewed at length with the patient today.  Concerns regarding medicines are outlined above.  No orders of the defined types were placed in this encounter.  No orders of the defined types were placed in this encounter.   Patient Instructions  Medication Instructions:  Your provider recommends that you continue on your current medications as directed. Please refer to the Current Medication list given to you today.    Labwork: None  Testing/Procedures: None  Follow-Up: Please keep your follow-up appointment with [name] on April 8th at 11:30AM.      Signed, Sherren Mocha, MD  12/03/2018 1:37 PM    [place]

## 2018-12-02 NOTE — Patient Instructions (Addendum)
You have fluid buildup around your right lung due to heart failure and liver problems. This has now formed a pocket due to infection  Omnicef 300 mg twice daily for 3 more weeks Repeat chest x-ray in 3 weeks  Dosing of Lasix based on weight, and aim for 160 pounds or lower

## 2018-12-02 NOTE — Patient Instructions (Addendum)
Medication Instructions:  Your provider recommends that you continue on your current medications as directed. Please refer to the Current Medication list given to you today.    Labwork: None  Testing/Procedures: None  Follow-Up: Please keep your follow-up appointment with [name] on April 8th at 11:30AM.

## 2018-12-03 ENCOUNTER — Telehealth: Payer: Self-pay | Admitting: Pulmonary Disease

## 2018-12-03 ENCOUNTER — Encounter: Payer: Self-pay | Admitting: Cardiovascular Disease

## 2018-12-03 NOTE — Telephone Encounter (Signed)
Decrease Mucinex to once daily and if his cough is fully resolved then okay to stop this after 1 week Please ensure he has follow-up appointment with me in 3 to 4 weeks

## 2018-12-03 NOTE — Telephone Encounter (Signed)
Primary Pulmonologist: RA Last office visit and with whom: RA on 12/03/2018 What do we see them for (pulmonary problems): PE on right Last OV assessment/plan:  Rigoberto Noel, MD (Physician) at 12/02/2018 3:36 PM - Signed    You have fluid buildup around your right lung due to heart failure and liver problems. This has now formed a pocket due to infection  Omnicef 300 mg twice daily for 3 more weeks Repeat chest x-ray in 3 weeks  Dosing of Lasix based on weight, and aim for 160 pounds or lower     Was appointment offered to patient (explain)?  No appt was offered today  Reason for call: Patient's daughter was asking if the patient needs to con't to take Mucinex twice daily? Thank you. Pt's daughter states patient's cough is better, he is not as congested at this time, and the cough is not productive.  RA please advise if patient needs to stop or continue with Mucinex

## 2018-12-03 NOTE — Telephone Encounter (Signed)
Called Arthur [name] and advised message from the provider. She understood and verbalized understanding. Nothing further is needed.

## 2018-12-22 ENCOUNTER — Encounter: Payer: Self-pay | Admitting: Internal Medicine

## 2018-12-22 ENCOUNTER — Ambulatory Visit (INDEPENDENT_AMBULATORY_CARE_PROVIDER_SITE_OTHER): Payer: Medicare Other | Admitting: Internal Medicine

## 2018-12-22 VITALS — BP 116/68 | HR 54 | Ht 72.0 in | Wt 156.0 lb

## 2018-12-22 DIAGNOSIS — J189 Pneumonia, unspecified organism: Secondary | ICD-10-CM

## 2018-12-22 DIAGNOSIS — I10 Essential (primary) hypertension: Secondary | ICD-10-CM | POA: Diagnosis not present

## 2018-12-22 DIAGNOSIS — I2510 Atherosclerotic heart disease of native coronary artery without angina pectoris: Secondary | ICD-10-CM | POA: Diagnosis not present

## 2018-12-22 DIAGNOSIS — I5032 Chronic diastolic (congestive) heart failure: Secondary | ICD-10-CM | POA: Diagnosis not present

## 2018-12-22 DIAGNOSIS — Z9181 History of falling: Secondary | ICD-10-CM

## 2018-12-22 DIAGNOSIS — J90 Pleural effusion, not elsewhere classified: Secondary | ICD-10-CM | POA: Diagnosis not present

## 2018-12-22 DIAGNOSIS — R739 Hyperglycemia, unspecified: Secondary | ICD-10-CM | POA: Diagnosis not present

## 2018-12-22 DIAGNOSIS — J9601 Acute respiratory failure with hypoxia: Secondary | ICD-10-CM

## 2018-12-22 DIAGNOSIS — J9600 Acute respiratory failure, unspecified whether with hypoxia or hypercapnia: Secondary | ICD-10-CM | POA: Insufficient documentation

## 2018-12-22 DIAGNOSIS — L89311 Pressure ulcer of right buttock, stage 1: Secondary | ICD-10-CM | POA: Diagnosis not present

## 2018-12-22 DIAGNOSIS — Z7952 Long term (current) use of systemic steroids: Secondary | ICD-10-CM

## 2018-12-22 DIAGNOSIS — Z7901 Long term (current) use of anticoagulants: Secondary | ICD-10-CM

## 2018-12-22 NOTE — Assessment & Plan Note (Signed)
Resolved, ok to d/c home o2 

## 2018-12-22 NOTE — Assessment & Plan Note (Signed)
stable overall by history and exam, recent data reviewed with pt, and pt to continue medical treatment as before,  to f/u any worsening symptoms or concerns  

## 2018-12-22 NOTE — Patient Instructions (Signed)
Please continue all other medications as before, and refills have been done if requested.  Please have the pharmacy call with any other refills you may need.  Please keep your appointments with your specialists as you may have planned - Dr Elsworth Soho next week  Please return in 3 months, or sooner if needed

## 2018-12-22 NOTE — Assessment & Plan Note (Signed)
With right loculation, non surgical candidate, finished extended omnicef course, for pulm f/u with cxr next wk

## 2018-12-22 NOTE — Progress Notes (Signed)
Subjective:    Patient ID: Arthur [name], male    DOB: 10-26-1925, 85 y.o.   MRN: [id number]  HPI  Here to f/u after recent d/c from rehab, now home for 3 days, overall doing well; has seen Dr Elsworth Soho recently now finished prolonged omnicef, denies fever, ST, dysuria and Pt denies chest pain, increased sob or doe, wheezing, orthopnea, PND, increased LE swelling, palpitations, dizziness or syncope.  Pt denies new neurological symptoms such as new headache, or facial or extremity weakness or numbness'   Pt denies polydipsia, polyuria. Due for PT at home twice weekly starting today.  Home o2 sat has been > 90% at rest and with exertion, daughter asks for order to [facility] to d/c the home o2.  Has pulm f/u next wk.  No other new complaints Past Medical History:  Diagnosis Date  . Arrhythmia    1995  . Atrial fibrillation ([place])   . CHF (congestive heart failure) ([place]) 10/13/2016  . Coronary artery disease    MI, atherectomy 1993  . DDD (degenerative disc disease)   . Eczema   . Elevated LFTs   . History of gallstones   . History of transient ischemic attack (TIA) 2005   double vision  . Hyperlipidemia   . Hypertension   . Pneumonia    long time ago  . Skin cancer    melanoma in remission  . Stroke [facility])    Past Surgical History:  Procedure Laterality Date  . AMPUTATION Right 05/10/2013   Procedure: RIGHT SECOND TOE AMPUTATION;  Surgeon: Hessie Dibble, MD;  Location: [place];  Service: Orthopedics;  Laterality: Right;  . APPENDECTOMY    . CARDIAC CATHETERIZATION  04/08/99   LAD 70% INTER 40-50% RCA 30-40%  . CHEST TUBE INSERTION Right 02/16/2017   Procedure: INSERTION PLEURAL DRAINAGE CATHETER;  Surgeon: Grace Isaac, MD;  Location: [place];  Service: Thoracic;  Laterality: Right;  . CORONARY ANGIOPLASTY  04/08/99   LAD  . CORONARY ANGIOPLASTY  10/17/97   RCA   . CORONARY ANGIOPLASTY WITH STENT PLACEMENT  04/08/99   LAD  . IR THORACENTESIS ASP PLEURAL SPACE W/IMG  GUIDE  11/22/2018  . MELANOMA EXCISION  2013   lt arm  . MELANOMA EXCISION Left 08/23/2013   Procedure: MELANOMA EXCISION;  Surgeon: Pedro Earls, MD;  [facility];  Service: General;  Laterality: Left;  . REMOVAL OF PLEURAL DRAINAGE CATHETER Right 05/07/2017   Procedure: REMOVAL OF PLEURAL DRAINAGE CATHETER;  Surgeon: Grace Isaac, MD;  Location: [place];  Service: Thoracic;  Laterality: Right;  . TONSILLECTOMY    . VIDEO BRONCHOSCOPY Bilateral 05/07/2015   Procedure: VIDEO BRONCHOSCOPY WITHOUT FLUORO;  Surgeon: Rigoberto Noel, MD;  Location: [place];  Service: Cardiopulmonary;  Laterality: Bilateral;    reports that he quit smoking about 51 years ago. His smoking use included cigars and pipe. He quit after 7.00 years of use. He has never used smokeless tobacco. He reports current alcohol use of about 1.0 standard drinks of alcohol per week. He reports that he does not use drugs. family history includes Heart attack (age of onset: 67) in his mother; Pneumonia in his father. Allergies  Allergen Reactions  . Penicillins Hives    Has patient had a PCN reaction causing immediate rash, facial/tongue/throat swelling, SOB or lightheadedness with hypotension:Yes Has patient had a PCN reaction causing severe rash involving mucus membranes or skin necrosis: No Has patient had a PCN reaction that  required hospitalization: No Has patient had a PCN reaction occurring within the last 10 years: No If all of the above answers are "NO", then may proceed with Cephalosporin use.    Current Outpatient Medications on File Prior to Visit  Medication Sig Dispense Refill  . Albuterol Sulfate (PROAIR RESPICLICK) 086 (90 Base) MCG/ACT AEPB Inhale 1 puff into the lungs every 4 (four) hours as needed (SOB). 30 each 0  . apixaban (ELIQUIS) 2.5 MG TABS tablet Take 1 tablet (2.5 mg total) by mouth 2 (two) times daily. 60 tablet 5  . Ascorbic Acid (VITAMIN C PO) Take 1 tablet by mouth daily.    .  Cholecalciferol (VITAMIN D3) 5000 units CAPS Take 5,000 Units by mouth daily.    . furosemide (LASIX) 20 MG tablet Take 1 tablet (20 mg total) by mouth every evening. 30 tablet 0  . furosemide (LASIX) 40 MG tablet Take 1 tablet (40 mg total) by mouth every morning. 30 tablet 0  . metoprolol tartrate (LOPRESSOR) 25 MG tablet Take 0.5 tablets (12.5 mg total) by mouth 2 (two) times daily. 60 tablet 0  . predniSONE (DELTASONE) 5 MG tablet Take 2 tablets (10 mg total) by mouth daily with breakfast. 100 tablet 3  . VITAMIN A PO Take 1 capsule by mouth daily.     No current facility-administered medications on file prior to visit.    Review of Systems  Constitutional: Negative for other unusual diaphoresis or sweats HENT: Negative for ear discharge or swelling Eyes: Negative for other worsening visual disturbances Respiratory: Negative for stridor or other swelling  Gastrointestinal: Negative for worsening distension or other blood Genitourinary: Negative for retention or other urinary change Musculoskeletal: Negative for other MSK pain or swelling Skin: Negative for color change or other new lesions Neurological: Negative for worsening tremors and other numbness  Psychiatric/Behavioral: Negative for worsening agitation or other fatigue All other system neg per pt    Objective:   Physical Exam BP 116/68   Pulse (!) 54   Ht 6' (1.829 m)   Wt 156 lb (70.8 kg)   SpO2 99%   BMI 21.16 kg/m  VS noted,  Constitutional: Pt appears in NAD HENT: Head: NCAT.  Right Ear: External ear normal.  Left Ear: External ear normal.  Eyes: . Pupils are equal, round, and reactive to light. Conjunctivae and EOM are normal Nose: without d/c or deformity Neck: Neck supple. Gross normal ROM Cardiovascular: Normal rate and regular rhythm.   Pulmonary/Chest: Effort normal and breath sounds decreased without rales or wheezing.  Abd:  Soft, NT, ND, + BS, no organomegaly Neurological: Pt is alert. At baseline  orientation, motor grossly intact Skin: Skin is warm. No rashes, other new lesions, no LE edema Psychiatric: Pt behavior is normal without agitation  No other exam findings Lab Results  Component Value Date   WBC 16.8 (H) 11/30/2018   HGB 13.4 11/30/2018   HCT 40.8 11/30/2018   PLT 313.0 11/30/2018   GLUCOSE 83 11/30/2018   CHOL 192 08/18/2016   TRIG 93.0 08/18/2016   HDL 58.10 08/18/2016   LDLCALC 115 (H) 08/18/2016   ALT 20 11/30/2018   AST 27 11/30/2018   NA 138 11/30/2018   K 3.9 11/30/2018   CL 94 (L) 11/30/2018   CREATININE 1.19 11/30/2018   BUN 35 (H) 11/30/2018   CO2 32 11/30/2018   TSH 1.45 08/18/2016   PSA 5.69 (H) 10/09/2009   INR 1.38 11/21/2018   HGBA1C 5.9 (H) 02/01/2016  Assessment & Plan:

## 2018-12-23 ENCOUNTER — Telehealth: Payer: Self-pay | Admitting: Internal Medicine

## 2018-12-23 NOTE — Telephone Encounter (Signed)
Verbal orders given  

## 2018-12-23 NOTE — Telephone Encounter (Signed)
Copied from [place] [id number]. Topic: Quick Communication - Home Health Verbal Orders >> Dec 23, 2018  3:09 PM Blase Mess A wrote: Caller/Agency: [facility] Number: [phone] ok to verbal on vm Requesting OT/PT/Skilled Nursing/Social Work: PT Frequency: 2 week2 1 week 6

## 2018-12-24 ENCOUNTER — Telehealth: Payer: Self-pay | Admitting: Internal Medicine

## 2018-12-24 NOTE — Telephone Encounter (Signed)
Copied from [place] [id number]. Topic: Quick Communication - See Telephone Encounter >> Dec 24, 2018  2:24 PM Antonieta Iba C wrote: CRM for notification. See Telephone encounter for: 12/24/18.  [place] [facility] is calling in to request vo for pt.   Frequency: 2 week 2 and 1 week 1 - OT   CB: [phone] -okay to lvm

## 2018-12-24 NOTE — Telephone Encounter (Signed)
Verbal orders given via VM 

## 2018-12-27 ENCOUNTER — Telehealth: Payer: Self-pay | Admitting: Pulmonary Disease

## 2018-12-27 NOTE — Telephone Encounter (Signed)
I advised the patients daughter we already have the order for cxr in and they would come in early and have that done. Nothing further needed

## 2018-12-28 ENCOUNTER — Telehealth: Payer: Self-pay

## 2018-12-28 NOTE — Telephone Encounter (Signed)
Copied from [place]. Topic: Referral - Request for Referral >> Dec 28, 2018 12:52 PM Scherrie Gerlach wrote: Sharyn Lull with [facility] calling to request the pt's OV notes from 12/22/2018 be faxed to her to serve as the face to face for the pt. The doctor in rehab that did the original face to face hand wrote the notes and it is illegible. The auditors are saying this will not work, and medicare will not pay.  [facility] requesting these OV notes faxed to  [phone] >> Dec 28, 2018  1:28 PM Peace, Tammy L wrote: Do either of you know anything about this? >> Dec 28, 2018  1:44 PM Lonia Skinner [place] A wrote: I know nothing of this.Marland Kitchen

## 2018-12-28 NOTE — Telephone Encounter (Signed)
Last OV has been faxed to [facility]

## 2018-12-28 NOTE — Telephone Encounter (Signed)
Ok to fax, thanks!

## 2018-12-30 ENCOUNTER — Ambulatory Visit (INDEPENDENT_AMBULATORY_CARE_PROVIDER_SITE_OTHER): Payer: Medicare Other | Admitting: Pulmonary Disease

## 2018-12-30 ENCOUNTER — Ambulatory Visit (INDEPENDENT_AMBULATORY_CARE_PROVIDER_SITE_OTHER)
Admission: RE | Admit: 2018-12-30 | Discharge: 2018-12-30 | Disposition: A | Discharge status: Home / Self Care | Payer: Medicare Other | Source: Ambulatory Visit | Attending: Pulmonary Disease | Admitting: Pulmonary Disease

## 2018-12-30 ENCOUNTER — Telehealth: Payer: Self-pay | Admitting: Cardiovascular Disease

## 2018-12-30 ENCOUNTER — Encounter: Payer: Self-pay | Admitting: Pulmonary Disease

## 2018-12-30 VITALS — BP 116/72 | HR 68 | Ht 72.0 in | Wt 154.6 lb

## 2018-12-30 DIAGNOSIS — I5032 Chronic diastolic (congestive) heart failure: Secondary | ICD-10-CM

## 2018-12-30 DIAGNOSIS — J90 Pleural effusion, not elsewhere classified: Secondary | ICD-10-CM | POA: Diagnosis not present

## 2018-12-30 DIAGNOSIS — I2510 Atherosclerotic heart disease of native coronary artery without angina pectoris: Secondary | ICD-10-CM

## 2018-12-30 MED ORDER — FUROSEMIDE 40 MG PO TABS: 40.0000 mg | ORAL_TABLET | Freq: Every morning | ORAL | 3 refills | Status: AC

## 2018-12-30 MED ORDER — FUROSEMIDE 20 MG PO TABS: 20.0000 mg | ORAL_TABLET | Freq: Every evening | ORAL | 3 refills | Status: AC

## 2018-12-30 NOTE — Telephone Encounter (Signed)
Per Dr. Antionette Char recent OV note, "The patient appears to be euvolemic on exam.  He has a component of chronic edema from venous insufficiency.  His neck veins appear flat today.  I recommended continuing his current furosemide dose."  Furosemide 40 mg qAM and 20 mg qPM called in.

## 2018-12-30 NOTE — Progress Notes (Signed)
   Subjective:    Patient ID: Arthur [name], male    DOB: 08/08/1925, 85 y.o.   MRN: [id number]  HPI   85 y.o.mr FU  of loculated right pleural effusion  PMH - AF on eliquis,  autoimmune hepatitis  on prednisone daily , progressed to cirrhosis,chronic diastolic heart failure and has had pleural effusions bilateral for many years on review of his prior imaging and has undergone thoracentesis in the past. He was initially admitted 12/27 for 2 days for acute on chronic diastolic heart failure, diuresed and discharged on oxygen.  He was again hospitalized 1/5 with fevers and chest x-ray showed increased right effusion with layering.  Thoracentesis was attempted by IR x2 but fluid could not be obtained. CT chest was then obtained which showed loculation of this right effusion  cultures negative -however urine pneumococcal antigen positive-treated with Rocephin And discharged on Omnicef , we completed course of Omnicef for 3 additional weeks after discharge.  He then went to rehab facility  He has improved somewhat, accompanied by his wife and daughter who confirm.  He remains short of breath on walking short distances.  Continues to have some mild pedal edema and occasional wheezing.  Oxygen level stays good  Chest x-ray was reviewed today which shows decrease right loculated effusion    Significant tests/ events reviewed  04/2015 episode of hemoptysis, fiberoptic bronchoscopy was negative. Echo 10/2018 shows normal LV systolic function, moderate aortic stenosis and moderate MR  Labs from 11/30/2018 were reviewed which shows WBC count increased to 16.8. LFTs nml   Review of Systems neg for any significant sore throat, dysphagia, itching, sneezing, nasal congestion or excess/ purulent secretions, fever, chills, sweats, unintended wt loss, pleuritic or exertional cp, hempoptysis, orthopnea pnd or change in chronic leg swelling. Also denies presyncope, palpitations, heartburn, abdominal  pain, nausea, vomiting, diarrhea or change in bowel or urinary habits, dysuria,hematuria, rash, arthralgias, visual complaints, headache, numbness weakness or ataxia.     Objective:   Physical Exam  Gen. Pleasant, elderly,well-nourished, in no distress ENT - no thrush, no pallor/icterus,no post nasal drip Neck: No JVD, no thyromegaly, no carotid bruits Lungs: no use of accessory muscles, no dullness to percussion,decreased RT without rales or rhonchi  Cardiovascular: Rhythm regular, heart sounds  normal, no murmurs or gallops, 2+  peripheral edema Musculoskeletal: No deformities, no cyanosis or clubbing        Assessment & Plan:

## 2018-12-30 NOTE — Assessment & Plan Note (Signed)
Continue Lasix 40 mg a.m. and 20 mg p.m. with weight goal of 160 pounds

## 2018-12-30 NOTE — Patient Instructions (Signed)
Fluid around the lungs has decreased  Repeat chest x-ray and follow-up visit in 6 weeks

## 2018-12-30 NOTE — Telephone Encounter (Signed)
New message   Pt c/o medication issue:  1. Name of Medication: furosemide (LASIX) 40 MG tablet  2. How are you currently taking this medication (dosage and times per day)?1 time daily and 20 mg in afternoon  3. Are you having a reaction (difficulty breathing--STAT)?no   4. What is your medication issue? Per patient's daughter wants to see if he can get a prescription for this medication no refills. [facility] to [facility] [address].

## 2018-12-30 NOTE — Assessment & Plan Note (Signed)
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  There was concern of infection due to loculation also has pneumococcal antigen was positive but he has been treated with antibiotics for 4 to 5 weeks.  Loculation appears to have decreased now and I doubt there is any active infection.  He is significantly improved. We will follow this loculated effusion to resolution/scarring.  I presume that some effusion will always persist based on his prior imaging.  Also he continues to have some degree of pedal edema

## 2018-12-31 ENCOUNTER — Ambulatory Visit: Payer: Medicare Other | Admitting: Pulmonary Disease

## 2019-01-04 ENCOUNTER — Telehealth: Payer: Self-pay

## 2019-01-04 NOTE — Telephone Encounter (Signed)
Will fax again   Copied from [place]. Topic: Referral - Request for Referral >> Dec 28, 2018 12:52 PM Scherrie Gerlach wrote: Sharyn Lull with [facility] calling to request the pt's OV notes from 12/22/2018 be faxed to her to serve as the face to face for the pt. The doctor in rehab that did the original face to face hand wrote the notes and it is illegible. The auditors are saying this will not work, and medicare will not pay.  [facility] requesting these OV notes faxed to  [phone] >> Dec 28, 2018  1:28 PM Peace, Tammy L wrote: Do either of you know anything about this? >> Dec 28, 2018  1:44 PM Lonia Skinner [place] A wrote: I know nothing of this.Marland Kitchen

## 2019-01-04 NOTE — Telephone Encounter (Signed)
Noted  Copied from [place] [id number]. Topic: General - Other >> Jan 04, 2019 12:49 PM Carolyn Stare wrote:  Ander Purpura a OT  with [facility] called to say pt missed a visit 12/31/2018

## 2019-01-07 ENCOUNTER — Telehealth: Payer: Self-pay | Admitting: Internal Medicine

## 2019-01-07 NOTE — Telephone Encounter (Signed)
Copied from [place] [phone]. Topic: General - Other >> Jan 07, 2019  2:04 PM Carolyn Stare wrote:  Nolberto Hanlon with [facility] call to report a OT miss vist     [phone]

## 2019-01-07 NOTE — Telephone Encounter (Signed)
Noted  

## 2019-01-16 NOTE — Progress Notes (Signed)
[name] [address] [place], [place] [address] Phone: [phone] Subjective:    I Arthur [name] am serving as a Education administrator for Dr. Hulan Saas.   I'm seeing this patient by the request  of:  Biagio Borg, MD   CC: Shoulder pain follow-up  YQI:[id number]  KEESHAWN FAKHOURI is a 85 y.o. male coming in with complaint of left shoulder pain. No numbness and tingling noted. Loss of ROM due to pain. States that he hears cracking in his shoulder. Change in atomostpheric pressure gives him the most pain.  Onset- Chronic  Location- anterior, between the elbow and the shoulder Duration-  Character- sharp, dull, achy, throbbing  Aggravating factors- Reliving factors-  Therapies tried- Tylenol  Severity-9 out of 10     Past Medical History:  Diagnosis Date  . Arrhythmia    1995  . Atrial fibrillation ([place])   . CHF (congestive heart failure) ([place]) 10/13/2016  . Coronary artery disease    MI, atherectomy 1993  . DDD (degenerative disc disease)   . Eczema   . Elevated LFTs   . History of gallstones   . History of transient ischemic attack (TIA) 2005   double vision  . Hyperlipidemia   . Hypertension   . Pneumonia    long time ago  . Skin cancer    melanoma in remission  . Stroke [facility])    Past Surgical History:  Procedure Laterality Date  . AMPUTATION Right 05/10/2013   Procedure: RIGHT SECOND TOE AMPUTATION;  Surgeon: Hessie Dibble, MD;  Location: [place];  Service: Orthopedics;  Laterality: Right;  . APPENDECTOMY    . CARDIAC CATHETERIZATION  04/08/99   LAD 70% INTER 40-50% RCA 30-40%  . CHEST TUBE INSERTION Right 02/16/2017   Procedure: INSERTION PLEURAL DRAINAGE CATHETER;  Surgeon: Grace Isaac, MD;  Location: [place];  Service: Thoracic;  Laterality: Right;  . CORONARY ANGIOPLASTY  04/08/99   LAD  . CORONARY ANGIOPLASTY  10/17/97   RCA   . CORONARY ANGIOPLASTY WITH STENT PLACEMENT  04/08/99   LAD  . IR  THORACENTESIS ASP PLEURAL SPACE W/IMG GUIDE  11/22/2018  . MELANOMA EXCISION  2013   lt arm  . MELANOMA EXCISION Left 08/23/2013   Procedure: MELANOMA EXCISION;  Surgeon: Pedro Earls, MD;  [facility];  Service: General;  Laterality: Left;  . REMOVAL OF PLEURAL DRAINAGE CATHETER Right 05/07/2017   Procedure: REMOVAL OF PLEURAL DRAINAGE CATHETER;  Surgeon: Grace Isaac, MD;  Location: [place];  Service: Thoracic;  Laterality: Right;  . TONSILLECTOMY    . VIDEO BRONCHOSCOPY Bilateral 05/07/2015   Procedure: VIDEO BRONCHOSCOPY WITHOUT FLUORO;  Surgeon: Rigoberto Noel, MD;  Location: [place];  Service: Cardiopulmonary;  Laterality: Bilateral;   Social History   Socioeconomic History  . Marital status: Married    Spouse name: Not on file  . Number of children: 6  . Years of education: Not on file  . Highest education level: Not on file  Occupational History  . Not on file  Social Needs  . Financial resource strain: Not hard at all  . Food insecurity:    Worry: Never true    Inability: Never true  . Transportation needs:    Medical: No    Non-medical: No  Tobacco Use  . Smoking status: Former Smoker    Years: 7.00    Types: Cigars, Pipe    Last attempt to quit: 11/18/1967  Years since quitting: 51.2  . Smokeless tobacco: Never Used  . Tobacco comment: 1-2 cigars a day, pipe  Substance and Sexual Activity  . Alcohol use: Yes    Alcohol/week: 1.0 standard drinks    Types: 1 Glasses of wine per week    Comment: 2-3 glasses of wine in the evening  . Drug use: No  . Sexual activity: Not Currently  Lifestyle  . Physical activity:    Days per week: 4 days    Minutes per session: 50 min  . Stress: Only a little  Relationships  . Social connections:    Talks on phone: More than three times a week    Gets together: More than three times a week    Attends religious service: More than 4 times per year    Active member of club or organization: Yes    Attends meetings  of clubs or organizations: More than 4 times per year    Relationship status: Married  Other Topics Concern  . Not on file  Social History Narrative  . Not on file   Allergies  Allergen Reactions  . Penicillins Hives    Has patient had a PCN reaction causing immediate rash, facial/tongue/throat swelling, SOB or lightheadedness with hypotension:Yes Has patient had a PCN reaction causing severe rash involving mucus membranes or skin necrosis: No Has patient had a PCN reaction that required hospitalization: No Has patient had a PCN reaction occurring within the last 10 years: No If all of the above answers are "NO", then may proceed with Cephalosporin use.    Family History  Problem Relation Age of Onset  . Heart attack Mother 28       MI  . Pneumonia Father     Current Outpatient Medications (Endocrine & Metabolic):  .  predniSONE (DELTASONE) 5 MG tablet, Take 2 tablets (10 mg total) by mouth daily with breakfast.  Current Outpatient Medications (Cardiovascular):  .  furosemide (LASIX) 20 MG tablet, Take 1 tablet (20 mg total) by mouth every evening. .  furosemide (LASIX) 40 MG tablet, Take 1 tablet (40 mg total) by mouth every morning. .  metoprolol tartrate (LOPRESSOR) 25 MG tablet, Take 0.5 tablets (12.5 mg total) by mouth 2 (two) times daily.  Current Outpatient Medications (Respiratory):  Marland Kitchen  Albuterol Sulfate (PROAIR RESPICLICK) 782 (90 Base) MCG/ACT AEPB, Inhale 1 puff into the lungs every 4 (four) hours as needed (SOB).   Current Outpatient Medications (Hematological):  .  apixaban (ELIQUIS) 2.5 MG TABS tablet, Take 1 tablet (2.5 mg total) by mouth 2 (two) times daily.  Current Outpatient Medications (Other):  Marland Kitchen  Ascorbic Acid (VITAMIN C PO), Take 1 tablet by mouth daily. .  Cholecalciferol (VITAMIN D3) 5000 units CAPS, Take 5,000 Units by mouth daily. Marland Kitchen  VITAMIN A PO, Take 1 capsule by mouth daily.    Past medical history, social, surgical and family history all  reviewed in electronic medical record.  No pertanent information unless stated regarding to the chief complaint.   Review of Systems:  No headache, visual changes, nausea, vomiting, diarrhea, constipation, dizziness, abdominal pain, skin rash, fevers, chills, night sweats, weight loss, swollen lymph nodes, body aches, joint swelling,  chest pain, shortness of breath, mood changes.  Positive muscle aches  Objective  Blood pressure 130/82, height 6' (1.829 m), weight 159 lb (72.1 kg), SpO2 96 %.    General: No apparent distress alert and oriented x3 mood and affect normal, dressed appropriately.  HEENT: Pupils equal, extraocular  movements intact  Respiratory: Patient's speak in full sentences and does not appear short of breath  Cardiovascular: trace lower extremity edema, non tender, no erythema  Skin: Warm dry intact with no signs of infection or rash on extremities or on axial skeleton.  Abdomen: Soft nontender  Neuro: Cranial nerves II through XII are intact, neurovascularly intact in all extremities with 2+ DTRs and 2+ pulses.  Lymph: No lymphadenopathy of posterior or anterior cervical chain or axillae bilaterally.  Gait antalgic gait walking with the aid of a 4 pronged cane MSK: Severe arthritic changes of multiple joints and patient is cachectic 4-5 strength of all the extremities Shoulder: Left Inspection severe atrophy of the musculature of the shoulder. Palpation diffusely tender Lacks range of motion with severe crepitus with range of motion testing 2+ out of 5 rotator cuff strength Of empty can noted.  Positive painful arc Speeds and Yergason's tests normal. No apprehension sign  Procedure: Real-time Ultrasound Guided Injection of left glenohumeral joint Device: GE Logiq E  Ultrasound guided injection is preferred based studies that show increased duration, increased effect, greater accuracy, decreased procedural pain, increased response rate with ultrasound guided versus  blind injection.  Verbal informed consent obtained.  Time-out conducted.  Noted no overlying erythema, induration, or other signs of local infection.  Skin prepped in a sterile fashion.  Local anesthesia: Topical Ethyl chloride.  With sterile technique and under real time ultrasound guidance:  Joint visualized.  21g 2 inch needle inserted posterior approach. Pictures taken for needle placement. Patient did have injection of 2 cc of 0.5% Marcaine, and 1cc of Kenalog 40 mg/dL. Completed without difficulty  Pain immediately resolved suggesting accurate placement of the medication.  Advised to call if fevers/chills, erythema, induration, drainage, or persistent bleeding.  Images permanently stored and available for review in the ultrasound unit.  Impression: Technically successful ultrasound guided injection.  [id number]; 15 additional minutes spent for Therapeutic exercises as stated in above notes.  This included exercises focusing on stretching, strengthening, with significant focus on eccentric aspects.   Long term goals include an improvement in range of motion, strength, endurance as well as avoiding reinjury. Patient's frequency would include in 1-2 times a day, 3-5 times a week for a duration of 6-12 weeks. Shoulder Exercises that included:  Basic scapular stabilization to include adduction and depression of scapula Scaption, focusing on proper movement and good control Internal and External rotation utilizing a theraband, with elbow tucked at side entire time Rows with theraband    Proper technique shown and discussed handout in great detail with ATC.  All questions were discussed and answered.     Impression and Recommendations:     This case required medical decision making of moderate complexity. The above documentation has been reviewed and is accurate and complete Lyndal Pulley, DO       Note: This dictation was prepared with Dragon dictation along with smaller phrase technology.  Any transcriptional errors that result from this process are unintentional.

## 2019-01-17 ENCOUNTER — Ambulatory Visit (INDEPENDENT_AMBULATORY_CARE_PROVIDER_SITE_OTHER)
Admission: RE | Admit: 2019-01-17 | Discharge: 2019-01-17 | Disposition: A | Discharge status: Home / Self Care | Payer: Medicare Other | Source: Ambulatory Visit | Attending: Family Medicine | Admitting: Family Medicine

## 2019-01-17 ENCOUNTER — Ambulatory Visit: Payer: Self-pay

## 2019-01-17 ENCOUNTER — Encounter: Payer: Self-pay | Admitting: Family Medicine

## 2019-01-17 ENCOUNTER — Ambulatory Visit (INDEPENDENT_AMBULATORY_CARE_PROVIDER_SITE_OTHER): Payer: Medicare Other | Admitting: Family Medicine

## 2019-01-17 VITALS — BP 130/82 | Ht 72.0 in | Wt 159.0 lb

## 2019-01-17 DIAGNOSIS — M25512 Pain in left shoulder: Secondary | ICD-10-CM

## 2019-01-17 DIAGNOSIS — I2510 Atherosclerotic heart disease of native coronary artery without angina pectoris: Secondary | ICD-10-CM

## 2019-01-17 DIAGNOSIS — G8929 Other chronic pain: Secondary | ICD-10-CM

## 2019-01-17 DIAGNOSIS — M12812 Other specific arthropathies, not elsewhere classified, left shoulder: Secondary | ICD-10-CM

## 2019-01-17 NOTE — Patient Instructions (Signed)
Great to meet you  Lots of arthritis  Xray downstairs Ice 20 minutes 2 times daily. Usually after activity and before bed. Can do heat for 10 minutes before activity  pennsaid pinkie amount topically 2 times daily as needed.  Tart cherry extract any dose at night Vitamin D 2000 IU daily  See me again in 2 months

## 2019-01-17 NOTE — Assessment & Plan Note (Signed)
Patient given an injection January 17, 2019. Patient does have severe arthritic changes.  X-rays ordered today.  Patient does have significant deconditioning noted as well.  Discussed which activities to do which wants to avoid.  Follow-up again in8 weeks can repeat injections every 8 weeks if needed.

## 2019-01-18 ENCOUNTER — Telehealth: Payer: Self-pay | Admitting: Internal Medicine

## 2019-01-18 NOTE — Telephone Encounter (Signed)
Verbal orders given  

## 2019-01-18 NOTE — Telephone Encounter (Signed)
Copied from [place] [id number]. Topic: Quick Communication - Home Health Verbal Orders >> Jan 18, 2019 12:56 PM Yvette Rack wrote: Caller/Agency: [place] with [facility] Number: [phone] Requesting OT/PT/Skilled Nursing/Social Work: OT Frequency: 1 time a week for 3 additional weeks

## 2019-01-19 ENCOUNTER — Telehealth: Payer: Self-pay | Admitting: Internal Medicine

## 2019-01-19 NOTE — Telephone Encounter (Signed)
Copied from [place] [id number]. Topic: General - Other >> Jan 19, 2019  4:05 PM Keene Breath wrote: Reason for CRM: Myriam Jacobson with [facility] called to inform the doctor that the patient cancelled his appointment this week.  Will continue next week.  Please advise and call if there are any questions.  CB# [phone]

## 2019-01-21 ENCOUNTER — Telehealth: Payer: Self-pay | Admitting: Cardiovascular Disease

## 2019-01-21 NOTE — Telephone Encounter (Signed)
Spoke with Glenard Haring, the patient's [facility] nurse. She states the patient is completely asymptomatic, but gained 3 pounds overnight. She states the patient was fully clothed and had eaten when he weighed himself (opposed to before that when he was weighing unclothed prior to eating in the morning).  She states that his ankle swelling is pretty similar to when he was evaluated here last, maybe slightly worse. She states she instructed Mr. Grantham and his family to follow a low salt diet, elevate legs while sitting, and to weigh at the same time and under the same circumstances every day.  Called Viann Fish. Left her a message reiterating Angel's recommendations. Informed her they will be called Monday to follow-up.

## 2019-01-21 NOTE — Telephone Encounter (Signed)
° °  Angel from [facility] calling to correct information given regarding swelling. Please call Glenard Haring at [phone] Also requesting call be made to daughter Viann Fish.   Patient weight 154 yesterday. Weight on March 4 was 151

## 2019-01-21 NOTE — Telephone Encounter (Signed)
Pt c/o swelling: STAT is pt has developed SOB within 24 hours  1) How much weight have you gained and in what time span? Wednesday patient has gained 5 pounds  2) If swelling, where is the swelling located? Not sure   3) Are you currently taking a fluid pill? Yes   4) Are you currently SOB? No   5) Do you have a log of your daily weights (if so, list)? No  6) Have you traveled recently? No

## 2019-01-24 NOTE — Telephone Encounter (Signed)
Follow up:     Patient states the nurse wanted patient to record weight for over the weekend. /Saturday 153/Sunday 154/ today 153. Please call patient wife if any questions.

## 2019-01-24 NOTE — Telephone Encounter (Signed)
Spoke with Arthur [name] who reports her dad is doing great. He is doing more cardio and his breathing has improved greatly. She attributes his weight fluctuations with not weighing under the same circumstances daily.  Confirmed Mr. Laur has an appointment with [name] in April. She understands to call prior to that time if she has any questions or concerns.

## 2019-01-25 ENCOUNTER — Encounter: Payer: Self-pay | Admitting: Podiatry

## 2019-01-26 ENCOUNTER — Ambulatory Visit (INDEPENDENT_AMBULATORY_CARE_PROVIDER_SITE_OTHER): Payer: Medicare Other | Admitting: Podiatry

## 2019-01-26 ENCOUNTER — Ambulatory Visit (INDEPENDENT_AMBULATORY_CARE_PROVIDER_SITE_OTHER): Payer: Medicare Other

## 2019-01-26 ENCOUNTER — Encounter: Payer: Self-pay | Admitting: Podiatry

## 2019-01-26 ENCOUNTER — Other Ambulatory Visit: Payer: Self-pay

## 2019-01-26 DIAGNOSIS — M79676 Pain in unspecified toe(s): Secondary | ICD-10-CM

## 2019-01-26 DIAGNOSIS — M2042 Other hammer toe(s) (acquired), left foot: Secondary | ICD-10-CM

## 2019-01-26 DIAGNOSIS — L97519 Non-pressure chronic ulcer of other part of right foot with unspecified severity: Secondary | ICD-10-CM

## 2019-01-26 DIAGNOSIS — M21611 Bunion of right foot: Secondary | ICD-10-CM

## 2019-01-26 DIAGNOSIS — I83005 Varicose veins of unspecified lower extremity with ulcer other part of foot: Secondary | ICD-10-CM | POA: Diagnosis not present

## 2019-01-26 DIAGNOSIS — M21612 Bunion of left foot: Secondary | ICD-10-CM

## 2019-01-26 DIAGNOSIS — B351 Tinea unguium: Secondary | ICD-10-CM | POA: Diagnosis not present

## 2019-01-26 DIAGNOSIS — L97512 Non-pressure chronic ulcer of other part of right foot with fat layer exposed: Secondary | ICD-10-CM

## 2019-01-26 DIAGNOSIS — I2510 Atherosclerotic heart disease of native coronary artery without angina pectoris: Secondary | ICD-10-CM

## 2019-01-26 DIAGNOSIS — L97522 Non-pressure chronic ulcer of other part of left foot with fat layer exposed: Secondary | ICD-10-CM

## 2019-01-26 MED ORDER — DOXYCYCLINE HYCLATE 100 MG PO TABS: 100.0000 mg | ORAL_TABLET | Freq: Two times a day (BID) | ORAL | 0 refills | Status: DC

## 2019-01-26 NOTE — Progress Notes (Signed)
HPI: 85 year old male with a history of multiple comorbidities presents the office today for evaluation of ulcers that have developed over the past few months.  Patient recently underwent rehab where he was active and moving on his feet and issues developed some blisters.  Currently he has been putting medi-honey on the wounds and wearing postoperative surgical shoes that he purchased on his own.  He presents today for further treatment evaluation  Past Medical History:  Diagnosis Date  . Arrhythmia    1995  . Atrial fibrillation ([place])   . CHF (congestive heart failure) ([place]) 10/13/2016  . Coronary artery disease    MI, atherectomy 1993  . DDD (degenerative disc disease)   . Eczema   . Elevated LFTs   . History of gallstones   . History of transient ischemic attack (TIA) 2005   double vision  . Hyperlipidemia   . Hypertension   . Pneumonia    long time ago  . Skin cancer    melanoma in remission  . Stroke [facility])      Physical Exam: General: The patient is alert and oriented x3 in no acute distress.  Dermatology: Ulcer noted to the medial aspect of the 1st MTPJ right foot measuring 0.6 x 0.6 x 0.4 cm (LxWxD).  Ulcer also noted to the dorsal aspect of the 2nd toe left overlying the PIPJ measuring approximately 0.5 x 0.8 x 0.3 cm.   To the noted ulcerations, the ulcers do probe to bone.  There was exposed bone noted to the head of the proximal phalanx of the second digit.  No malodor noted.  There is some mild erythema noted localized around the first MTPJ of the right foot.  Moderate amount of serous drainage noted.  Periwound integrity intact  Vascular: Palpable pedal pulses bilaterally.  Skin is warm to touch.  Moderate edema noted bilateral lower extremities.  Capillary refill within normal limits.  Neurological: Epicritic and protective threshold diminished bilaterally.   Musculoskeletal Exam: History of right second toe amputation > 5 yrs prior.  There is also hammertoe  deformity of the left second toe with dorsal dislocation at the MTPJ.  The second toe overlaps the great toe.  Hallux abductovalgus bunion deformity noted bilateral with hallux abductus greater than 40 degrees  Radiographic Exam:  Normal osseous mineralization given the patient's age.  Although there is exposed bone in the wounds to probe to bone I do not see on x-ray any evidence of acute osteomyelitis or osteolytic destruction to the areas of concern.  Assessment: 1.  Ulcer bilateral feet secondary to rubbing of shoe gear 2.  Venous insufficiency bilateral lower extremities with varicosities noted 3.  Cellulitis right foot 4.  Hammertoe deformity second digit left foot 5.  Bunion deformity bilateral   Plan of Care:  1. Patient evaluated. X-Rays reviewed.  2.  Given the patient's multiple comorbidities and poor health as well as the age of the patient, I am going to pursue conservative treatment initially and avoid taking the patient to the OR unless absolutely necessary 3.  Medically necessary excisional debridement including muscle and deep fascial tissue, as well as portions of bone from the toe ulceration, was performed using a tissue nipper.  Excisional debridement of all the necrotic nonviable tissue down to healthy bleeding viable tissue was performed with post debridement measurement same as pre- 4.  The exposed portion of the head of the proximal phalanx of the second toe was also resected away using a tissue nipper.  This will likely cause change in radiographic exam which could falsely represent osteomyelitis.  We will get follow-up x-rays in 4 weeks 5.  Dry sterile dressing was applied.  Recommend Betadine and dry sterile dressing daily 6.  Postoperative shoes were dispensed bilateral.  Weightbearing as tolerated. 7.  Cultures were taken right foot wound and sent to pathology 8.  Prescription for doxycycline 100 mg #20 twice daily 9.  Also, mechanical debridement of nails 1-5  bilateral was performed using a nail nipper without incident.   10.  Return to clinic in 1 week for follow-up evaluation      Edrick Kins, DPM [facility]  Dr. Edrick Kins, DPM    [address], [place] [address]                Office [phone]  Fax [phone]

## 2019-01-30 LAB — WOUND CULTURE
MICRO NUMBER:: 311872
SPECIMEN QUALITY:: ADEQUATE

## 2019-02-01 ENCOUNTER — Ambulatory Visit: Payer: Medicare Other | Admitting: Sports Medicine

## 2019-02-01 ENCOUNTER — Telehealth: Payer: Self-pay | Admitting: Podiatry

## 2019-02-01 NOTE — Telephone Encounter (Signed)
Dr. Amalia Hailey prescribed an antibiotic that says can be taken on an empty stomach. It is making dad sick on his stomach, so can he take it with food? Please call me back at [phone].

## 2019-02-01 NOTE — Telephone Encounter (Signed)
Informed pt's daughter that it was ok to put something on pt's stomach while taking anti-biotic.

## 2019-02-02 ENCOUNTER — Ambulatory Visit: Payer: Medicare Other | Admitting: Podiatry

## 2019-02-02 ENCOUNTER — Telehealth: Payer: Self-pay | Admitting: Podiatry

## 2019-02-02 ENCOUNTER — Encounter: Payer: Self-pay | Admitting: Podiatry

## 2019-02-02 NOTE — Telephone Encounter (Signed)
My father has an appointment scheduled for 2:15 pm today. He is concerned about coming in due to the coronavirus. I was wondering is it important that he come to this appointment or can he show Dr. Amalia Hailey' over his iPhone what his feet looks like right now. If you would please call me back at [phone].

## 2019-02-02 NOTE — Telephone Encounter (Signed)
Called pt, spoke to daughter and informed per Dr. Amalia Hailey, that it was ok to send picture via MyChart. Also informed to continue to take care of wound as directed.

## 2019-02-02 NOTE — Telephone Encounter (Signed)
Pt daughter called and wanted to know if it was necessary for her father to come in with all is going on. She wants to speak to a nurse about his symptoms. Please call Arthur [name] back before his appointment today at 2:15 with Dr. Paulla Dolly

## 2019-02-04 ENCOUNTER — Telehealth: Payer: Self-pay | Admitting: Nurse Practitioner

## 2019-02-04 ENCOUNTER — Ambulatory Visit: Payer: Self-pay | Admitting: *Deleted

## 2019-02-04 NOTE — Telephone Encounter (Signed)
'  Zoila Shutter' RN with [facility] with pt on home visit. States pt has audible wheezing, HR 31-40. RN states HR usually   high 70's - low 80's. Reports pt's BP 122/78 O2 Sat 95%. States pt with increased fatigue. States she has be seeing the pt for quite some time and "He seems a lot different." Also reports "Mild" headache. Pt with significant history.  Instructed pt to go to ED. HH RN states she will arrange transport.   Reason for Disposition . Heart beating very slowly (e.g., < 50 / minute)  (Exception: athlete)  Answer Assessment - Initial Assessment Questions 1. DESCRIPTION: "Please describe your heart rate or heart beat that you are having" (e.g., fast/slow, regular/irregular, skipped or extra beats, "palpitations")    31-40 2. ONSET: "When did it start?" (Minutes, hours or days)      Unsure. [place] RN at home visit 3. DURATION: "How long does it last" (e.g., seconds, minutes, hours)     presently 4. PATTERN "Does it come and go, or has it been constant since it started?"  "Does it get worse with exertion?"   "Are you feeling it now?"     5. TAP: "Using your hand, can you tap out what you are feeling on a chair or table in front of you, so that I can hear?" (Note: not all patients can do this)       6. HEART RATE: "Can you tell me your heart rate?" "How many beats in 15 seconds?"  (Note: not all patients can do this)      31-40 7. RECURRENT SYMPTOM: "Have you ever had this before?" If so, ask: "When was the last time?" and "What happened that time?"       8. CAUSE: "What do you think is causing the palpitations?"     9. CARDIAC HISTORY: "Do you have any history of heart disease?" (e.g., heart attack, angina, bypass surgery, angioplasty, arrhythmia)     A fib AAA CHF MI  DVT 10. OTHER SYMPTOMS: "Do you have any other symptoms?" (e.g., dizziness, chest pain, sweating, difficulty breathing)      Audible wheezing, SOB, increased fatigue  Protocols used: HEART RATE AND HEARTBEAT  QUESTIONS-A-AH

## 2019-02-04 NOTE — Telephone Encounter (Signed)
Noted that fire service & EMS was at the household - I live 2 doors down. EMS asked me to see and daughter agreed.   EMS called due to concern for bradycardia. HR per pulse ox in the 40's per HHN. She also noted she checked radial and carotid pulse as well.   Tag is totally asymptomatic - denies chest pain, shortness of breath, palpitations or dizziness. Currently on antibiotics for foot infection. Has chronic swelling of his lower extremities. He has had no syncope. No fever or cough reported as well. He says he has been taking his medicines as prescribed.   EMS arrived - monitor shows AF with PVCs - BP 114/62 to 120/82.   He has been eating ok - little upset stomach with recent addition of Doxycycline - otherwise he states he feels fine. He did not wish to be transferred to the ER.   I have spoken to the [facility] present - have given verbal order for BMET, MG level and CBC to be drawn stat. For now, will continue with observation. His family is present. They know that he is has symptoms of chest pain, shortness of breath, syncope, then would proceed to the ER.   [name], RN, [place] [address] [place], [place]  [address] [phone]

## 2019-02-05 ENCOUNTER — Telehealth: Payer: Self-pay | Admitting: Nurse Practitioner

## 2019-02-05 NOTE — Telephone Encounter (Signed)
Follow up with daughter - Santiago Glad - regarding yesterday.   Labs were not able to be obtained.   Another nurse was sent to the home today to obtain labs.   HR today is 68 and oxyghen sat is 96%.   As of 8:50PM - I do not see any labs in Epic.    He is currently feeling well with no issues noted.    [name], RN, [place] [address] [place], [place]  [address] [phone]

## 2019-02-07 ENCOUNTER — Other Ambulatory Visit: Payer: Self-pay

## 2019-02-07 ENCOUNTER — Other Ambulatory Visit: Payer: Medicare Other | Admitting: *Deleted

## 2019-02-07 ENCOUNTER — Other Ambulatory Visit: Payer: Self-pay | Admitting: *Deleted

## 2019-02-07 ENCOUNTER — Telehealth: Payer: Self-pay | Admitting: Nurse Practitioner

## 2019-02-07 DIAGNOSIS — E875 Hyperkalemia: Secondary | ICD-10-CM

## 2019-02-07 LAB — BASIC METABOLIC PANEL
BUN/Creatinine Ratio: 30 — ABNORMAL HIGH (ref 10–24)
BUN: 37 mg/dL — ABNORMAL HIGH (ref 10–36)
CO2: 27 mmol/L (ref 20–29)
Calcium: 9.4 mg/dL (ref 8.6–10.2)
Chloride: 99 mmol/L (ref 96–106)
Creatinine, Ser: 1.24 mg/dL (ref 0.76–1.27)
GFR calc Af Amer: 58 mL/min/{1.73_m2} — ABNORMAL LOW (ref >59)
GFR calc non Af Amer: 51 mL/min/{1.73_m2} — ABNORMAL LOW (ref >59)
Glucose: 111 mg/dL — ABNORMAL HIGH (ref 65–99)
Potassium: 4.8 mmol/L (ref 3.5–5.2)
Sodium: 139 mmol/L (ref 134–144)

## 2019-02-07 NOTE — Telephone Encounter (Signed)
I received a text and subsequent phone call from Mr. Buchinger's daughter Santiago Glad at 12 midnight last night.   His blood was drawn on Saturday afternoon. No results that I can see in Epic.   She informed me that results were called to her by [facility] at that his potassium was 8.1 - she had been instructed to take him to the ER.   We had phone discussion about how to proceed. Mr. Cordts had had a good day on Sunday - no symptoms whatsoever.   They elected to come to our office here this morning for a stat BMET.  [name], RN, [place] [address] [place], [place]  [address] [phone]

## 2019-02-10 ENCOUNTER — Encounter: Payer: Self-pay | Admitting: Adult Health

## 2019-02-10 ENCOUNTER — Ambulatory Visit: Payer: Medicare Other | Admitting: Adult Health

## 2019-02-10 ENCOUNTER — Ambulatory Visit (INDEPENDENT_AMBULATORY_CARE_PROVIDER_SITE_OTHER): Payer: Medicare Other | Admitting: Adult Health

## 2019-02-10 ENCOUNTER — Other Ambulatory Visit: Payer: Self-pay

## 2019-02-10 DIAGNOSIS — J90 Pleural effusion, not elsewhere classified: Secondary | ICD-10-CM | POA: Diagnosis not present

## 2019-02-10 NOTE — Patient Instructions (Addendum)
Continue on current regimen  Follow up with Breckyn Troyer NP in 6 weeks with chest xray .

## 2019-02-10 NOTE — Progress Notes (Signed)
Virtual Visit via Telephone Note  I connected with Arthur [name] on 02/10/19 at 11:00 AM EDT by telephone and verified that I am speaking with the correct person using two identifiers.   I discussed the limitations, risks, security and privacy concerns of performing an evaluation and management service by telephone and the availability of in person appointments. I also discussed with the patient that there may be a patient responsible charge related to this service. The patient expressed understanding and agreed to proceed.   History of Present Illness: 85 year old male followed for loculated right pleural effusion.  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  He has chronic diastolic heart failure and has had bilateral pleural effusions for many years.  In December patient had decompensated congestive heart failure.  And had an increased right effusion.  Thoracentesis was attempted but unsuccessful.  CT chest showed a loculated right effusion.  Cultures were negative however his urine pneumococcal antigen was positive and he was treated with Rocephin and discharged on [place] for 3 weeks.  Patient was seen in the office last month.  He was clinically improved.  And chest x-ray showed a decreased right loculated effusion. Since last visit patient is feeling better. No increased cough or congestion  Eating well. No n/v.d.  No bleeding .    Observations/Objective: 04/2015 episode of hemoptysis, fiberoptic bronchoscopy was negative. Echo 10/2018 shows normal LV systolic function, moderate aortic stenosis and moderate MR  Labs from 11/30/2018 were reviewed which shows WBC count increased to 16.8. 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  Completed prolonged course of antibiotics.  Appears to be clinically improving.  He has no acute symptoms and has not  decompensated since last visit.  We will continue to monitor clinically.  Hold off on bringing patient into the office currently.  He will follow-up in 6 weeks with a chest x-ray pending COVID precautions are allowing.   Follow Up Instructions: Continue current regimen. Follow-up in 6 weeks with chest x-ray    I discussed the assessment and treatment plan with the patient. The patient was provided an opportunity to ask questions and all were answered. The patient agreed with the plan and demonstrated an understanding of the instructions.   The patient was advised to call back or seek an in-person evaluation if the symptoms worsen or if the condition fails to improve as anticipated.  I provided 8  minutes of non-face-to-face time during this encounter. Patient and wife present on phone in their home for visit.  Myself present at office  Call ended at 11:39   Rexene Edison, NP
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
## 2019-02-23 ENCOUNTER — Encounter: Payer: Self-pay | Admitting: Nurse Practitioner

## 2019-02-23 ENCOUNTER — Telehealth (INDEPENDENT_AMBULATORY_CARE_PROVIDER_SITE_OTHER): Payer: Medicare Other | Admitting: Nurse Practitioner

## 2019-02-23 ENCOUNTER — Other Ambulatory Visit: Payer: Self-pay

## 2019-02-23 VITALS — BP 125/80 | Wt 155.0 lb

## 2019-02-23 DIAGNOSIS — I4821 Permanent atrial fibrillation: Secondary | ICD-10-CM

## 2019-02-23 DIAGNOSIS — I2510 Atherosclerotic heart disease of native coronary artery without angina pectoris: Secondary | ICD-10-CM

## 2019-02-23 DIAGNOSIS — W19XXXD Unspecified fall, subsequent encounter: Secondary | ICD-10-CM

## 2019-02-23 DIAGNOSIS — Z79899 Other long term (current) drug therapy: Secondary | ICD-10-CM

## 2019-02-23 DIAGNOSIS — I5032 Chronic diastolic (congestive) heart failure: Secondary | ICD-10-CM

## 2019-02-23 NOTE — Patient Instructions (Addendum)
After Visit Summary:  We will be checking the following labs today - NONE   Medication Instructions:    Continue with your current medicines.    If you need a refill on your cardiac medications before your next appointment, please call your pharmacy.     Testing/Procedures To Be Arranged:  N/A  Follow-Up:   See me in 4 months in August.     At [facility], you and your health needs are our priority.  As part of our continuing mission to provide you with exceptional heart care, we have created designated Provider Care Teams.  These Care Teams include your primary Cardiologist (physician) and Advanced Practice Providers (APPs -  Physician Assistants and Nurse Practitioners) who all work together to provide you with the care you need, when you need it.  Special Instructions:  . Stay safe, stay home and wash your hands for at least 20 seconds! . It was good to talk with you today.  . Be on the lookout for any headache, nausea or feeling like you are too sleepy - this may indicate that we need to get a scan on your head.  Allegra Grana your legs as best you can   Call the [place] office at [phone] if you have any questions, problems or concerns.

## 2019-02-24 ENCOUNTER — Encounter: Payer: Self-pay | Admitting: Podiatry

## 2019-03-02 ENCOUNTER — Telehealth: Payer: Self-pay | Admitting: Nurse Practitioner

## 2019-03-02 NOTE — Telephone Encounter (Signed)
Daughter reaching out with several concerns as they move forward with Jace's care and [name]'s recent death.   I am referring him to [facility] - to have nurse and social worker if needed. Sounds like some assistive care is in the works as well. He is having some wound issues - will get that addressed.   Contact person will be the daughter Santiago Glad - [phone].  She was very appreciative for the help.   [name], RN, [place] [address] [place], [place]  [phone]

## 2019-03-03 ENCOUNTER — Encounter

## 2019-03-08 ENCOUNTER — Telehealth: Payer: Self-pay | Admitting: Nurse Practitioner

## 2019-03-08 NOTE — Telephone Encounter (Signed)
lvm with verbal order for Arthur [name] with [facility],  pt to have PT and foam for skin care.  Also left Lori's NPI number and phone number if needed.

## 2019-03-08 NOTE — Telephone Encounter (Signed)
New Message   Deanna with [facility] is calling on behalf of patient. They would like to add orders for physical therapy.  Also for his skin care would like to add zero foam and telfa. Please call to advise.

## 2019-03-11 ENCOUNTER — Telehealth: Payer: Self-pay | Admitting: Nurse Practitioner

## 2019-03-11 NOTE — Telephone Encounter (Signed)
Spoke with Deana with [facility] nurses and she is asking for an [place] for the pt to increase his ambulation and comfort with swelling.. I advised her [name] is out of the office this afternoon and she says it can wait until next week... she would not be able to get it and to him before then anyways.  Will forward to Truitt Merle NP/ Danielle for the order if [name] approves.   Her # [phone]

## 2019-03-11 NOTE — Telephone Encounter (Signed)
  Arthur [name] is calling to see if she can do a weekly una boot for edema management. Please let her know

## 2019-03-11 NOTE — Telephone Encounter (Signed)
Yes this will be fine

## 2019-03-14 ENCOUNTER — Ambulatory Visit (INDEPENDENT_AMBULATORY_CARE_PROVIDER_SITE_OTHER): Payer: Medicare Other | Admitting: Podiatry

## 2019-03-14 ENCOUNTER — Other Ambulatory Visit: Payer: Self-pay

## 2019-03-14 ENCOUNTER — Ambulatory Visit (INDEPENDENT_AMBULATORY_CARE_PROVIDER_SITE_OTHER): Payer: Medicare Other

## 2019-03-14 ENCOUNTER — Encounter: Payer: Self-pay | Admitting: Podiatry

## 2019-03-14 VITALS — Temp 97.2°F

## 2019-03-14 DIAGNOSIS — L97522 Non-pressure chronic ulcer of other part of left foot with fat layer exposed: Secondary | ICD-10-CM

## 2019-03-14 DIAGNOSIS — M21612 Bunion of left foot: Secondary | ICD-10-CM

## 2019-03-14 DIAGNOSIS — M21611 Bunion of right foot: Secondary | ICD-10-CM

## 2019-03-14 DIAGNOSIS — M2042 Other hammer toe(s) (acquired), left foot: Secondary | ICD-10-CM

## 2019-03-14 DIAGNOSIS — I83005 Varicose veins of unspecified lower extremity with ulcer other part of foot: Secondary | ICD-10-CM

## 2019-03-14 NOTE — Telephone Encounter (Signed)
Deana, nurse from [place] is aware RNs  recommendation's were OK'd by [name].

## 2019-03-15 ENCOUNTER — Telehealth: Payer: Self-pay | Admitting: Cardiovascular Disease

## 2019-03-15 NOTE — Telephone Encounter (Signed)
[name] has written script for walker and will give to pt's daughter.

## 2019-03-15 NOTE — Telephone Encounter (Signed)
New Message   Need a rx for a four wheel walker with seat and breaks to be sent to [facility]

## 2019-03-17 ENCOUNTER — Other Ambulatory Visit: Payer: Self-pay

## 2019-03-17 ENCOUNTER — Ambulatory Visit (INDEPENDENT_AMBULATORY_CARE_PROVIDER_SITE_OTHER): Payer: Medicare Other | Admitting: Adult Health

## 2019-03-17 ENCOUNTER — Ambulatory Visit (INDEPENDENT_AMBULATORY_CARE_PROVIDER_SITE_OTHER): Payer: Medicare Other

## 2019-03-17 ENCOUNTER — Encounter: Payer: Self-pay | Admitting: Adult Health

## 2019-03-17 VITALS — BP 116/68 | HR 64 | Ht 74.0 in | Wt 160.0 lb

## 2019-03-17 DIAGNOSIS — J90 Pleural effusion, not elsewhere classified: Secondary | ICD-10-CM | POA: Diagnosis not present

## 2019-03-17 DIAGNOSIS — I5032 Chronic diastolic (congestive) heart failure: Secondary | ICD-10-CM | POA: Diagnosis not present

## 2019-03-17 DIAGNOSIS — I2510 Atherosclerotic heart disease of native coronary artery without angina pectoris: Secondary | ICD-10-CM | POA: Diagnosis not present

## 2019-03-17 NOTE — Assessment & Plan Note (Signed)
Bilateral pleural effusions right greater than left.  Patient had a significant pneumonia in December 2019.  He is much improved after antibiotics.  Patient has had ongoing chronic effusions for some time now felt secondary to diastolic heart failure and cirrhosis..  Looking over his x-rays going back these appear stable.  Possibly just slightly larger than 2 months ago however he is clinically improved. Recommend patient advance activity as tolerated.  Work on a healthy diet.  Low-salt intake. We will follow-up in 3 months with a follow-up chest x-ray.

## 2019-03-17 NOTE — Assessment & Plan Note (Signed)
Appears stable.  Continue follow-up with cardiology.

## 2019-03-17 NOTE — Patient Instructions (Signed)
Continue on current regimen  Follow up with Dr. Elsworth Soho  In 3 months and As needed  With chest xray .

## 2019-03-17 NOTE — Progress Notes (Signed)
@Patient  ID: Arthur [name], male    DOB: 1924-11-19, 85 y.o.   MRN: [id number]  Chief Complaint  Patient presents with  . Follow-up    Pleural Effusion     Referring provider: Biagio Borg, MD  HPI: 85 year old male followed for loculated right pleural effusion.  Patient has autoimmune hepatitis on daily prednisone.  Has chronic diastolic heart failure with chronic  bilateral pleural effusions.   TEST/EVENTS :  04/2015 episode of hemoptysis, fiberoptic bronchoscopy was negative. Echo 10/2018 shows normal LV systolic function, moderate aortic stenosis and moderate MR  Labs from 11/30/2018 were reviewed which shows WBC count increased to 16.8. LFTs nml  03/17/2019 Follow up : Pleural effusion  Patient returns for a one-month follow-up.  Patient has a known loculated right pleural effusion.  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  He has clinically been improving after antibiotics.  In December 2019 patient had decompensated congestive heart failure.  With a notable increased right effusion.  He underwent a thoracentesis but that was unsuccessful.  CT chest showed a loculated right effusion.  Cultures during this admission were negative however his urine pneumococcal antigen was positive and he was treated with Rocephin and discharged on Omnicef for 3 weeks.  Last month he was clinically improving.  Today a chest x-ray shows stable small bilateral effusion Right greater than left. Much improved from December 2019 .  Patient is accompanied by his family member today.  Patient that he is slowly getting better.  Is starting to do physical therapy at home.  He still remains very weak. Patient's wife died a few weeks ago.  They have been married over 52 years.  Support was provided.  Patient denies any increased cough or congestion.  Has chronic lower extremity swelling.  Says it is been stable.  He is currently getting wraps at  home.  Patient is followed by cardiology.. Weight is improved some he is up 5 pounds.  We discussed a healthy protein diet.   Allergies  Allergen Reactions  . Penicillins Hives    Has patient had a PCN reaction causing immediate rash, facial/tongue/throat swelling, SOB or lightheadedness with hypotension:Yes Has patient had a PCN reaction causing severe rash involving mucus membranes or skin necrosis: No Has patient had a PCN reaction that required hospitalization: No Has patient had a PCN reaction occurring within the last 10 years: No If all of the above answers are "NO", then may proceed with Cephalosporin use.     Immunization History  Administered Date(s) Administered  . Influenza Whole 09/01/2008, 11/05/2010  . Influenza, High Dose Seasonal PF 11/09/2015, 09/12/2017, 08/31/2018  . Influenza,inj,Quad PF,6+ Mos 09/16/2013, 12/12/2014, 08/21/2016  . Influenza-Unspecified 08/17/2014  . Pneumococcal Conjugate-13 07/01/2018    Past Medical History:  Diagnosis Date  . Arrhythmia    1995  . Atrial fibrillation ([place])   . CHF (congestive heart failure) ([place]) 10/13/2016  . Coronary artery disease    MI, atherectomy 1993  . DDD (degenerative disc disease)   . Eczema   . Elevated LFTs   . History of gallstones   . History of transient ischemic attack (TIA) 2005   double vision  . Hyperlipidemia   . Hypertension   . Pneumonia    long time ago  . Skin cancer    melanoma in remission  . Stroke [facility])     Tobacco History: Social History   Tobacco Use  Smoking Status Former Smoker  .  Years: 7.00  . Types: Cigars, Pipe  . Last attempt to quit: 11/18/1967  . Years since quitting: 51.3  Smokeless Tobacco Never Used  Tobacco Comment   1-2 cigars a day, pipe   Counseling given: Not Answered Comment: 1-2 cigars a day, pipe   Outpatient Medications Prior to Visit  Medication Sig Dispense Refill  . Albuterol Sulfate (PROAIR RESPICLICK) 086 (90 Base) MCG/ACT AEPB Inhale 1  puff into the lungs every 4 (four) hours as needed (SOB). 30 each 0  . apixaban (ELIQUIS) 2.5 MG TABS tablet Take 1 tablet (2.5 mg total) by mouth 2 (two) times daily. 60 tablet 5  . Ascorbic Acid (VITAMIN C PO) Take 1 tablet by mouth daily.    . Cholecalciferol (VITAMIN D3) 5000 units CAPS Take 5,000 Units by mouth daily.    . furosemide (LASIX) 20 MG tablet Take 1 tablet (20 mg total) by mouth every evening. 90 tablet 3  . furosemide (LASIX) 40 MG tablet Take 1 tablet (40 mg total) by mouth every morning. 90 tablet 3  . metoprolol tartrate (LOPRESSOR) 25 MG tablet Take 0.5 tablets (12.5 mg total) by mouth 2 (two) times daily. 60 tablet 0  . predniSONE (DELTASONE) 5 MG tablet Take 2 tablets (10 mg total) by mouth daily with breakfast. 100 tablet 3  . UNABLE TO FIND Med Name: [employer] Tart daily    . VITAMIN A PO Take 1 capsule by mouth daily.     No facility-administered medications prior to visit.      Review of Systems:   Constitutional:   No  weight loss, night sweats,  Fevers, chills, + fatigue, or  lassitude.  HEENT:   No headaches,  Difficulty swallowing,  Tooth/dental problems, or  Sore throat,                No sneezing, itching, ear ache, nasal congestion, post nasal drip,   CV:  No chest pain,  Orthopnea, PND, +swelling in lower extremities,  No anasarca, dizziness, palpitations, syncope.   GI  No heartburn, indigestion, abdominal pain, nausea, vomiting, diarrhea, change in bowel habits, loss of appetite, bloody stools.   Resp:  .  No chest wall deformity  Skin: no rash or lesions.  GU: no dysuria, change in color of urine, no urgency or frequency.  No flank pain, no hematuria   MS:  No joint pain or swelling.  No decreased range of motion.  No back pain.    Physical Exam  BP 116/68 (BP Location: Left Arm, Cuff Size: Normal)   Pulse 64   Ht 6\' 2"  (1.88 m)   Wt 160 lb (72.6 kg)   SpO2 100%   BMI 20.54 kg/m   GEN: A/Ox3; pleasant , NAD, elderly, wheelchair   HEENT:  [place]/AT,  EACs-clear, TMs-wnl, NOSE-clear, THROAT-clear, no lesions, no postnasal drip or exudate noted.   NECK:  Supple w/ fair ROM; no JVD; normal carotid impulses w/o bruits; no thyromegaly or nodules palpated; no lymphadenopathy.    RESP  Clear  P & A; w/o, wheezes/ rales/ or rhonchi. no accessory muscle use, no dullness to percussion  CARD:  RRR, no m/r/g, 1+ peripheral edema-leg wraps, pulses intact, no cyanosis or clubbing.  GI:   Soft & nt; nml bowel sounds; no organomegaly or masses detected.   Musco: Warm bil, no deformities or joint swelling noted.    Neuro: alert, no focal deficits noted.    Skin: Warm, no lesions or rashes    Lab  Results:  CBC  BMET  BNP  Imaging: Dg Chest 2 View  Result Date: 03/17/2019 CLINICAL DATA:  Follow-up bilateral pleural effusions EXAM: CHEST - 2 VIEW COMPARISON:  12/30/2018 FINDINGS: Cardiomegaly persists. Aortic atherosclerosis as seen previously. There has been slight enlargement of bilateral pleural effusions compared to the previous study. Atelectasis remains evident at the lung bases in association with those effusions, right more than left. Incidental note is made of calcified gallstones in the right upper quadrant. No acute or significant bone finding. IMPRESSION: Cardiomegaly and aortic atherosclerosis as seen previously. Persistent and slightly enlarged bilateral pleural effusions with basilar atelectasis right more than left. Electronically Signed   By: Nelson Chimes M.D.   On: 03/17/2019 16:08   Dg Foot Complete Left  Result Date: 03/14/2019 Please see detailed radiograph report in office note.     No flowsheet data found.  No results found for: NITRICOXIDE      Assessment & Plan:   Pleural effusion, bilateral Bilateral pleural effusions right greater than left.  Patient had a significant pneumonia in December 2019.  He is much improved after antibiotics.  Patient has had ongoing chronic effusions for some time  now felt secondary to diastolic heart failure and cirrhosis..  Looking over his x-rays going back these appear stable.  Possibly just slightly larger than 2 months ago however he is clinically improved. Recommend patient advance activity as tolerated.  Work on a healthy diet.  Low-salt intake. We will follow-up in 3 months with a follow-up chest x-ray.  Chronic diastolic heart failure (HCC) Appears stable.  Continue follow-up with cardiology.     Rexene Edison, NP 03/17/2019

## 2019-03-18 DEATH — deceased

## 2019-03-20 NOTE — Progress Notes (Signed)
[name] [place] Arthur [name], [place] [address] Phone: [phone] Subjective:   I Arthur [name] am serving as a Education administrator for Dr. Hulan Saas.   CC: Left shoulder pain follow-up  [id number]   01/17/2019 atient given an injection January 17, 2019. Patient does have severe arthritic changes.  X-rays ordered today.  Patient does have significant deconditioning noted as well.  Discussed which activities to do which wants to avoid.  Follow-up again in8 weeks can repeat injections every 8 weeks if needed.  03/21/2019 Arthur [name] is a 85 y.o. male coming in with complaint of left shoulder pain.  Found to have glenohumeral arthritis.  Given an injection in March 2020. States that he is in pain today.  Patient has severe arthritis of the left shoulder noted.  Advanced bone-on-bone arthritis.     Past Medical History:  Diagnosis Date  . Arrhythmia    1995  . Atrial fibrillation ([place])   . CHF (congestive heart failure) ([place]) 10/13/2016  . Coronary artery disease    MI, atherectomy 1993  . DDD (degenerative disc disease)   . Eczema   . Elevated LFTs   . History of gallstones   . History of transient ischemic attack (TIA) 2005   double vision  . Hyperlipidemia   . Hypertension   . Pneumonia    long time ago  . Skin cancer    melanoma in remission  . Stroke [facility])    Past Surgical History:  Procedure Laterality Date  . AMPUTATION Right 05/10/2013   Procedure: RIGHT SECOND TOE AMPUTATION;  Surgeon: Hessie Dibble, MD;  Location: [place];  Service: Orthopedics;  Laterality: Right;  . APPENDECTOMY    . CARDIAC CATHETERIZATION  04/08/99   LAD 70% INTER 40-50% RCA 30-40%  . CHEST TUBE INSERTION Right 02/16/2017   Procedure: INSERTION PLEURAL DRAINAGE CATHETER;  Surgeon: Grace Isaac, MD;  Location: [place];  Service: Thoracic;  Laterality: Right;  . CORONARY ANGIOPLASTY  04/08/99   LAD  . CORONARY ANGIOPLASTY  10/17/97   RCA    . CORONARY ANGIOPLASTY WITH STENT PLACEMENT  04/08/99   LAD  . IR THORACENTESIS ASP PLEURAL SPACE W/IMG GUIDE  11/22/2018  . MELANOMA EXCISION  2013   lt arm  . MELANOMA EXCISION Left 08/23/2013   Procedure: MELANOMA EXCISION;  Surgeon: Pedro Earls, MD;  [facility];  Service: General;  Laterality: Left;  . REMOVAL OF PLEURAL DRAINAGE CATHETER Right 05/07/2017   Procedure: REMOVAL OF PLEURAL DRAINAGE CATHETER;  Surgeon: Grace Isaac, MD;  Location: [place];  Service: Thoracic;  Laterality: Right;  . TONSILLECTOMY    . VIDEO BRONCHOSCOPY Bilateral 05/07/2015   Procedure: VIDEO BRONCHOSCOPY WITHOUT FLUORO;  Surgeon: Rigoberto Noel, MD;  Location: [place];  Service: Cardiopulmonary;  Laterality: Bilateral;   Social History   Socioeconomic History  . Marital status: Married    Spouse name: Not on file  . Number of children: 6  . Years of education: Not on file  . Highest education level: Not on file  Occupational History  . Not on file  Social Needs  . Financial resource strain: Not hard at all  . Food insecurity:    Worry: Never true    Inability: Never true  . Transportation needs:    Medical: No    Non-medical: No  Tobacco Use  . Smoking status: Former Smoker    Years: 7.00    Types: Cigars,  Pipe    Last attempt to quit: 11/18/1967    Years since quitting: 51.3  . Smokeless tobacco: Never Used  . Tobacco comment: 1-2 cigars a day, pipe  Substance and Sexual Activity  . Alcohol use: Yes    Alcohol/week: 1.0 standard drinks    Types: 1 Glasses of wine per week    Comment: 2-3 glasses of wine in the evening  . Drug use: No  . Sexual activity: Not Currently  Lifestyle  . Physical activity:    Days per week: 4 days    Minutes per session: 50 min  . Stress: Only a little  Relationships  . Social connections:    Talks on phone: More than three times a week    Gets together: More than three times a week    Attends religious service: More than 4 times per year     Active member of club or organization: Yes    Attends meetings of clubs or organizations: More than 4 times per year    Relationship status: Married  Other Topics Concern  . Not on file  Social History Narrative  . Not on file   Allergies  Allergen Reactions  . Penicillins Hives    Has patient had a PCN reaction causing immediate rash, facial/tongue/throat swelling, SOB or lightheadedness with hypotension:Yes Has patient had a PCN reaction causing severe rash involving mucus membranes or skin necrosis: No Has patient had a PCN reaction that required hospitalization: No Has patient had a PCN reaction occurring within the last 10 years: No If all of the above answers are "NO", then may proceed with Cephalosporin use.    Family History  Problem Relation Age of Onset  . Heart attack Mother 26       MI  . Pneumonia Father     Current Outpatient Medications (Endocrine & Metabolic):  .  predniSONE (DELTASONE) 5 MG tablet, Take 2 tablets (10 mg total) by mouth daily with breakfast.  Current Outpatient Medications (Cardiovascular):  .  furosemide (LASIX) 20 MG tablet, Take 1 tablet (20 mg total) by mouth every evening. .  furosemide (LASIX) 40 MG tablet, Take 1 tablet (40 mg total) by mouth every morning. .  metoprolol tartrate (LOPRESSOR) 25 MG tablet, Take 0.5 tablets (12.5 mg total) by mouth 2 (two) times daily.  Current Outpatient Medications (Respiratory):  Marland Kitchen  Albuterol Sulfate (PROAIR RESPICLICK) 443 (90 Base) MCG/ACT AEPB, Inhale 1 puff into the lungs every 4 (four) hours as needed (SOB).   Current Outpatient Medications (Hematological):  .  apixaban (ELIQUIS) 2.5 MG TABS tablet, Take 1 tablet (2.5 mg total) by mouth 2 (two) times daily.  Current Outpatient Medications (Other):  Marland Kitchen  Ascorbic Acid (VITAMIN C PO), Take 1 tablet by mouth daily. .  Cholecalciferol (VITAMIN D3) 5000 units CAPS, Take 5,000 Units by mouth daily. Marland Kitchen  UNABLE TO FIND, Med Name: [employer] Tart daily  .  VITAMIN A PO, Take 1 capsule by mouth daily.    Past medical history, social, surgical and family history all reviewed in electronic medical record.  No pertanent information unless stated regarding to the chief complaint.   Review of Systems:  No headache, visual changes, nausea, vomiting, diarrhea, constipation, dizziness, abdominal pain, skin rash, fevers, chills, night sweats, weight loss, swollen lymph nodes, body aches, joint swelling,  chest pain, shortness of breath, mood changes.  Positive muscle aches  Objective  Blood pressure 138/90, pulse (!) 54, height 6\' 2"  (1.88 m), weight 167 lb (  75.8 kg), SpO2 96 %.    General: No apparent distress alert and oriented x3 mood and affect normal, dressed appropriately.  HEENT: Pupils equal, extraocular movements intact  Respiratory: Patient's speak in full sentences and does not appear short of breath  Cardiovascular: No lower extremity edema, non tender, no erythema  Skin: Warm dry intact with no signs of infection or rash on extremities or on axial skeleton.  Abdomen: Soft nontender  Neuro: Cranial nerves II through XII are intact, neurovascularly intact in all extremities with 2+ DTRs and 2+ pulses.  Lymph: No lymphadenopathy of posterior or anterior cervical chain or axillae bilaterally.  Gait antalgic MSK: Severe arthritic changes. Shoulder: Left Inspection shows severe atrophy of the shoulder girdle. Palpation is normal with no tenderness over AC joint or bicipital groove. Range of motion in all planes with significant amount of pain.  Crepitus noted as well.  2 out of 5 strength of the rotator cuff neurovascular intact distally After informed written and verbal consent, patient was seated on exam table. Left shoulder was prepped with alcohol swab and utilizing posterior approach, patient's right glenohumeral space was injected with 4:1  marcaine 0.5%: Kenalog 40mg /dL. Patient tolerated the procedure well without immediate  complications.  After informed written and verbal consent, patient was seated on exam table. Left shoulder was prepped with alcohol swab and utilizing posterior approach, patient's right glenohumeral space was injected with 4:1  marcaine 0.5%: Kenalog 40mg /dL. Patient tolerated the procedure well without immediate complications.   Impression and Recommendations:     This case required medical decision making of moderate complexity. The above documentation has been reviewed and is accurate and complete Lyndal Pulley, DO       Note: This dictation was prepared with Dragon dictation along with smaller phrase technology. Any transcriptional errors that result from this process are unintentional.

## 2019-03-21 ENCOUNTER — Encounter: Payer: Self-pay | Admitting: Family Medicine

## 2019-03-21 ENCOUNTER — Other Ambulatory Visit: Payer: Self-pay

## 2019-03-21 ENCOUNTER — Encounter: Payer: Self-pay | Admitting: Internal Medicine

## 2019-03-21 ENCOUNTER — Ambulatory Visit (INDEPENDENT_AMBULATORY_CARE_PROVIDER_SITE_OTHER): Payer: Medicare Other | Admitting: Internal Medicine

## 2019-03-21 ENCOUNTER — Ambulatory Visit (INDEPENDENT_AMBULATORY_CARE_PROVIDER_SITE_OTHER): Payer: Medicare Other | Admitting: Family Medicine

## 2019-03-21 ENCOUNTER — Encounter: Payer: Self-pay | Admitting: Podiatry

## 2019-03-21 ENCOUNTER — Ambulatory Visit (INDEPENDENT_AMBULATORY_CARE_PROVIDER_SITE_OTHER): Payer: Medicare Other | Admitting: Podiatry

## 2019-03-21 VITALS — BP 138/90 | HR 54 | Temp 97.6°F | Ht 74.0 in | Wt 167.0 lb

## 2019-03-21 VITALS — Temp 97.3°F

## 2019-03-21 DIAGNOSIS — I2510 Atherosclerotic heart disease of native coronary artery without angina pectoris: Secondary | ICD-10-CM

## 2019-03-21 DIAGNOSIS — M12812 Other specific arthropathies, not elsewhere classified, left shoulder: Secondary | ICD-10-CM

## 2019-03-21 DIAGNOSIS — L97522 Non-pressure chronic ulcer of other part of left foot with fat layer exposed: Secondary | ICD-10-CM

## 2019-03-21 DIAGNOSIS — I10 Essential (primary) hypertension: Secondary | ICD-10-CM

## 2019-03-21 DIAGNOSIS — N183 Chronic kidney disease, stage 3 unspecified: Secondary | ICD-10-CM

## 2019-03-21 DIAGNOSIS — M21612 Bunion of left foot: Secondary | ICD-10-CM

## 2019-03-21 DIAGNOSIS — L97512 Non-pressure chronic ulcer of other part of right foot with fat layer exposed: Secondary | ICD-10-CM

## 2019-03-21 DIAGNOSIS — R739 Hyperglycemia, unspecified: Secondary | ICD-10-CM | POA: Diagnosis not present

## 2019-03-21 DIAGNOSIS — M21611 Bunion of right foot: Secondary | ICD-10-CM

## 2019-03-21 DIAGNOSIS — I83005 Varicose veins of unspecified lower extremity with ulcer other part of foot: Secondary | ICD-10-CM

## 2019-03-21 DIAGNOSIS — M2042 Other hammer toe(s) (acquired), left foot: Secondary | ICD-10-CM

## 2019-03-21 NOTE — Assessment & Plan Note (Signed)
stable overall by history and exam, recent data reviewed with pt, and pt to continue medical treatment as before,  to f/u any worsening symptoms or concerns, declines further lab today

## 2019-03-21 NOTE — Assessment & Plan Note (Signed)
stable overall by history and exam, recent data reviewed with pt, and pt to continue medical treatment as before,  to f/u any worsening symptoms or concerns, declines f/u lab today

## 2019-03-21 NOTE — Assessment & Plan Note (Signed)
Patient given a repeat injection.  Discussed icing regimen and home exercise.  Discussed which activities to do which will still avoid.  Follow-up again in 4 to 6 weeks

## 2019-03-21 NOTE — Patient Instructions (Signed)
Please continue all other medications as before, and refills have been done if requested.  Please have the pharmacy call with any other refills you may need.  Please continue your efforts at being more active, low cholesterol diet, and weight control.  Please keep your appointments with your specialists as you may have planned  Please see Dr Tamala Julian in the office next  Please return in 4 months, or sooner if needed

## 2019-03-21 NOTE — Progress Notes (Signed)
Subjective:    Patient ID: Arthur [name], male    DOB: [birth year], 85 y.o.   MRN: [id number]  HPI  Here to f/u with daughter; overall doing ok,  Pt denies chest pain, increasing sob or doe, wheezing, orthopnea, PND, increased LE swelling, palpitations, dizziness or syncope.  Pt denies new neurological symptoms such as new headache, or facial or extremity weakness or numbness.  Pt denies polydipsia, polyuria, Trying to drink 4-6 glasses water per day   Pt states overall good compliance with meds, mostly trying to follow appropriate diet, with wt overall stable on current diuretic,  Has RN and PT to home on regular basis, most recently had a right leg bleb rupture with LE swelling with lesion now healed, and having wraps to both legs changed weekly.  No new complaints. To see Dr Tamala Julian later this am BP Readings from Last 3 Encounters:  03/21/19 138/90  03/17/19 116/68  02/23/19 125/80   Wt Readings from Last 3 Encounters:  03/21/19 167 lb (75.8 kg)  03/17/19 160 lb (72.6 kg)  02/23/19 155 lb (70.3 kg)   Past Medical History:  Diagnosis Date  . Arrhythmia    1995  . Atrial fibrillation ([place])   . CHF (congestive heart failure) ([place]) 10/13/2016  . Coronary artery disease    MI, atherectomy 1993  . DDD (degenerative disc disease)   . Eczema   . Elevated LFTs   . History of gallstones   . History of transient ischemic attack (TIA) 2005   double vision  . Hyperlipidemia   . Hypertension   . Pneumonia    long time ago  . Skin cancer    melanoma in remission  . Stroke [facility])    Past Surgical History:  Procedure Laterality Date  . AMPUTATION Right 05/10/2013   Procedure: RIGHT SECOND TOE AMPUTATION;  Surgeon: Hessie Dibble, MD;  Location: [place];  Service: Orthopedics;  Laterality: Right;  . APPENDECTOMY    . CARDIAC CATHETERIZATION  04/08/99   LAD 70% INTER 40-50% RCA 30-40%  . CHEST TUBE INSERTION Right 02/16/2017   Procedure: INSERTION PLEURAL DRAINAGE  CATHETER;  Surgeon: Grace Isaac, MD;  Location: [place];  Service: Thoracic;  Laterality: Right;  . CORONARY ANGIOPLASTY  04/08/99   LAD  . CORONARY ANGIOPLASTY  10/17/97   RCA   . CORONARY ANGIOPLASTY WITH STENT PLACEMENT  04/08/99   LAD  . IR THORACENTESIS ASP PLEURAL SPACE W/IMG GUIDE  11/22/2018  . MELANOMA EXCISION  2013   lt arm  . MELANOMA EXCISION Left 08/23/2013   Procedure: MELANOMA EXCISION;  Surgeon: Pedro Earls, MD;  [facility];  Service: General;  Laterality: Left;  . REMOVAL OF PLEURAL DRAINAGE CATHETER Right 05/07/2017   Procedure: REMOVAL OF PLEURAL DRAINAGE CATHETER;  Surgeon: Grace Isaac, MD;  Location: [place];  Service: Thoracic;  Laterality: Right;  . TONSILLECTOMY    . VIDEO BRONCHOSCOPY Bilateral 05/07/2015   Procedure: VIDEO BRONCHOSCOPY WITHOUT FLUORO;  Surgeon: Rigoberto Noel, MD;  Location: [place];  Service: Cardiopulmonary;  Laterality: Bilateral;    reports that he quit smoking about 51 years ago. His smoking use included cigars and pipe. He quit after 7.00 years of use. He has never used smokeless tobacco. He reports current alcohol use of about 1.0 standard drinks of alcohol per week. He reports that he does not use drugs. family history includes Heart attack (age of onset: 70) in his mother; Pneumonia in his  father. Allergies  Allergen Reactions  . Penicillins Hives    Has patient had a PCN reaction causing immediate rash, facial/tongue/throat swelling, SOB or lightheadedness with hypotension:Yes Has patient had a PCN reaction causing severe rash involving mucus membranes or skin necrosis: No Has patient had a PCN reaction that required hospitalization: No Has patient had a PCN reaction occurring within the last 10 years: No If all of the above answers are "NO", then may proceed with Cephalosporin use.    Current Outpatient Medications on File Prior to Visit  Medication Sig Dispense Refill  . Albuterol Sulfate (PROAIR RESPICLICK)  564 (90 Base) MCG/ACT AEPB Inhale 1 puff into the lungs every 4 (four) hours as needed (SOB). 30 each 0  . apixaban (ELIQUIS) 2.5 MG TABS tablet Take 1 tablet (2.5 mg total) by mouth 2 (two) times daily. 60 tablet 5  . Ascorbic Acid (VITAMIN C PO) Take 1 tablet by mouth daily.    . Cholecalciferol (VITAMIN D3) 5000 units CAPS Take 5,000 Units by mouth daily.    . furosemide (LASIX) 20 MG tablet Take 1 tablet (20 mg total) by mouth every evening. 90 tablet 3  . furosemide (LASIX) 40 MG tablet Take 1 tablet (40 mg total) by mouth every morning. 90 tablet 3  . metoprolol tartrate (LOPRESSOR) 25 MG tablet Take 0.5 tablets (12.5 mg total) by mouth 2 (two) times daily. 60 tablet 0  . predniSONE (DELTASONE) 5 MG tablet Take 2 tablets (10 mg total) by mouth daily with breakfast. 100 tablet 3  . UNABLE TO FIND Med Name: [employer] Tart daily    . VITAMIN A PO Take 1 capsule by mouth daily.     No current facility-administered medications on file prior to visit.    Review of Systems  Constitutional: Negative for other unusual diaphoresis or sweats HENT: Negative for ear discharge or swelling Eyes: Negative for other worsening visual disturbances Respiratory: Negative for stridor or other swelling  Gastrointestinal: Negative for worsening distension or other blood Genitourinary: Negative for retention or other urinary change Musculoskeletal: Negative for other MSK pain or swelling Skin: Negative for color change or other new lesions Neurological: Negative for worsening tremors and other numbness  Psychiatric/Behavioral: Negative for worsening agitation or other fatigue All other system neg per pt    Objective:   Physical Exam BP 138/90 (BP Location: Left Arm, Patient Position: Sitting, Cuff Size: Normal)   Pulse (!) 54   Temp 97.6 F (36.4 C) (Oral)   Ht 6\' 2"  (1.88 m)   Wt 167 lb (75.8 kg)   SpO2 96%   BMI 21.44 kg/m  VS noted, not ill appearing Constitutional: Pt appears in NAD HENT:  Head: NCAT.  Right Ear: External ear normal.  Left Ear: External ear normal.  Eyes: . Pupils are equal, round, and reactive to light. Conjunctivae and EOM are normal Nose: without d/c or deformity Neck: Neck supple. Gross normal ROM Cardiovascular: Normal rate and irregular rhythm.   Pulmonary/Chest: Effort normal and breath sounds without rales or wheezing.  Neurological: Pt is alert. At baseline orientation, motor grossly intact Skin: Skin is warm. No rashes, other new lesions, has I suspect 1-2+ RLE edema and possibly trace LLE edema but not clear as legs are well wrapped Psychiatric: Pt behavior is normal without agitation  No other exam findings  Lab Results  Component Value Date   WBC 16.8 (H) 11/30/2018   HGB 13.4 11/30/2018   HCT 40.8 11/30/2018   PLT 313.0 11/30/2018  GLUCOSE 111 (H) 02/07/2019   CHOL 192 08/18/2016   TRIG 93.0 08/18/2016   HDL 58.10 08/18/2016   LDLCALC 115 (H) 08/18/2016   ALT 20 11/30/2018   AST 27 11/30/2018   NA 139 02/07/2019   K 4.8 02/07/2019   CL 99 02/07/2019   CREATININE 1.24 02/07/2019   BUN 37 (H) 02/07/2019   CO2 27 02/07/2019   TSH 1.45 08/18/2016   PSA 5.69 (H) 10/09/2009   INR 1.38 11/21/2018   HGBA1C 5.9 (H) 02/01/2016        Assessment & Plan:

## 2019-03-21 NOTE — Assessment & Plan Note (Signed)
stable overall by history and exam, recent data reviewed with pt, and pt to continue medical treatment as before,  to f/u any worsening symptoms or concerns  

## 2019-03-21 NOTE — Patient Instructions (Signed)
God to see you  Ice is your friend pennsaid pinkie amount topically 2 times daily as needed.  Stay safe  See me again in 2 months

## 2019-03-22 ENCOUNTER — Ambulatory Visit: Payer: Medicare Other | Admitting: Internal Medicine

## 2019-03-24 ENCOUNTER — Telehealth: Payer: Self-pay | Admitting: Nurse Practitioner

## 2019-03-24 NOTE — Progress Notes (Signed)
HPI: 85 year old male with a history of multiple comorbidities presents the office today for follow-up evaluation of ulcers to the bilateral feet.  Last visit on 03/14/2019 the ulcer to the right first MTPJ had healed.  Debridement was performed to the left second toe with instructions to keep it clean dry and intact x1 week.  He has done so and is been weightbearing in a postoperative shoe.  He presents today for further treatment evaluation  Past Medical History:  Diagnosis Date  . Arrhythmia    1995  . Atrial fibrillation ([place])   . CHF (congestive heart failure) ([place]) 10/13/2016  . Coronary artery disease    MI, atherectomy 1993  . DDD (degenerative disc disease)   . Eczema   . Elevated LFTs   . History of gallstones   . History of transient ischemic attack (TIA) 2005   double vision  . Hyperlipidemia   . Hypertension   . Pneumonia    long time ago  . Skin cancer    melanoma in remission  . Stroke [facility])      Physical Exam: General: The patient is alert and oriented x3 in no acute distress.  Dermatology: Ulcer noted to the medial aspect of the first MPJ right foot has recurred.  There is some mild erythema noted to the first MTPJ.  Ulcer also noted to the dorsal aspect of the 2nd toe left overlying the PIPJ measuring approximately 0.40.6 x 0.3 cm.   To the noted ulceration to the second toe left foot, the ulcer does probe to bone.  There was exposed bone noted to the head of the proximal phalanx of the second digit.  No malodor noted.  Erythema localized around the first MPJ of the right foot appears to be resolved.  Moderate amount of serous drainage noted.  Periwound integrity intact  Ulcer noted to the right first MTPJ measures approximately 0.5 x 0.5 x 0.2 cm.  There is some mild erythema localized around the area.  Negative for malodor.  Wound base and granulation tissue noted with minimal fibrotic tissue.  Vascular: Palpable pedal pulses bilaterally.  Skin is warm to  touch.  Moderate edema noted bilateral lower extremities.  Capillary refill within normal limits.  Neurological: Epicritic and protective threshold diminished bilaterally.   Musculoskeletal Exam: History of right second toe amputation > 5 yrs prior.  There is also hammertoe deformity of the left second toe with dorsal dislocation at the MTPJ.  The second toe overlaps the great toe.  Hallux abductovalgus bunion deformity noted bilateral with hallux abductus greater than 40 degrees  Assessment: 1.  Ulcer bilateral feet with fat layer exposed  2.  Venous insufficiency bilateral lower extremities with varicosities noted 3.  Cellulitis right foot- improved 4.  Hammertoe deformity second digit left foot 5.  Bunion deformity bilateral   Plan of Care:  1. Patient evaluated. 2.  Medically necessary excisional debridement including muscle and deep fascial tissue was performed using a tissue nipper to the first MPJ of the right foot as well as the second digit of the left foot.  Excisional debridement of all necrotic nonviable tissue down to healthy bleeding viable tissue was performed with post debridement measurement same as pre-.  Additional bone resection was also performed using a tissue nipper to the ulceration to the left second toe. 3.  Today we decided to perform delayed primarily close the ulceration to the left second toe.  This may accelerate healing and prevent continued exposed bone from  getting infected.  No local anesthesia was utilized.  Patient is somewhat neuropathic and 5-0 nylon suture was utilized.  Primary closure obtained after the toe was prepped in aseptic manner.  Dry sterile dressing applied with instructions to keep clean dry and intact x1 week 4.  Continue postoperative shoes bilateral 5.  The patient has weekly multilayer compression wraps ordered by his cardiologist for lower extremity edema.  Wraps left intact today 6.  Return to clinic in 1 week   Edrick Kins, DPM  [facility]  Dr. Edrick Kins, DPM    [address], [place] [address]                Office [phone]  Fax [phone]

## 2019-03-24 NOTE — Telephone Encounter (Signed)
Daughter Santiago Glad has reached out to me this morning - Rainn is having more shortness of breath with lying down at night. Probably getting more salt that he typically does. Family bringing more outside food in light of wife's death a month ago. Weight is unchanged. No chest pain reported. He has also been very upset due to the whole COVID issue and a good friend of his died yesterday as well.   Increasing Lasix to 40 mg BID for 3 days - then back to his 40 mg in the am and 20 mg in the PM. Home health is coming back out on Tuesday next week - we will ask for a BMET at that time. I will leave a RX for the lab in his mailbox.   [name], RN, [place] [address] [place], [place]  [phone]

## 2019-03-24 NOTE — Progress Notes (Signed)
HPI: 85 year old male with a history of multiple comorbidities presents the office today for follow-up evaluation of bilateral foot ulcers.  Patient has been wearing postoperative shoes as directed.  He is also almost completed his oral antibiotic doxycycline that was prescribed last visit.  He presents today for follow-up treatment and evaluation regarding ulcerations  Past Medical History:  Diagnosis Date  . Arrhythmia    1995  . Atrial fibrillation ([place])   . CHF (congestive heart failure) ([place]) 10/13/2016  . Coronary artery disease    MI, atherectomy 1993  . DDD (degenerative disc disease)   . Eczema   . Elevated LFTs   . History of gallstones   . History of transient ischemic attack (TIA) 2005   double vision  . Hyperlipidemia   . Hypertension   . Pneumonia    long time ago  . Skin cancer    melanoma in remission  . Stroke [facility])      Physical Exam: General: The patient is alert and oriented x3 in no acute distress.  Dermatology: Ulcer noted to the medial aspect of the first MPJ right foot has healed.  Complete reepithelialization appears to have occurred.  Ulcer also noted to the dorsal aspect of the 2nd toe left overlying the PIPJ measuring approximately 0.40.6 x 0.3 cm.   To the noted ulceration, the ulcer does probe to bone.  There was exposed bone noted to the head of the proximal phalanx of the second digit.  No malodor noted.  Erythema localized around the first MPJ of the right foot appears to be resolved.  Moderate amount of serous drainage noted.  Periwound integrity intact  Vascular: Palpable pedal pulses bilaterally.  Skin is warm to touch.  Moderate edema noted bilateral lower extremities.  Capillary refill within normal limits.  Neurological: Epicritic and protective threshold diminished bilaterally.   Musculoskeletal Exam: History of right second toe amputation > 5 yrs prior.  There is also hammertoe deformity of the left second toe with dorsal dislocation  at the MTPJ.  The second toe overlaps the great toe.  Hallux abductovalgus bunion deformity noted bilateral with hallux abductus greater than 40 degrees  Radiographic Exam:  Normal osseous mineralization given the patient's age.  Although there is exposed bone in the wounds to probe to bone I do not see on x-ray any evidence of acute osteomyelitis or osteolytic destruction to the areas of concern.  Assessment: 1.  Ulcer bilateral feet secondary to rubbing of shoe gear 2.  Venous insufficiency bilateral lower extremities with varicosities noted 3.  Cellulitis right foot 4.  Hammertoe deformity second digit left foot 5.  Bunion deformity bilateral   Plan of Care:  1. Patient evaluated. X-Rays reviewed.  2.  Given the patient's multiple comorbidities and poor health as well as the age of the patient, I am going to pursue conservative treatment initially and avoid taking the patient to the OR unless absolutely necessary 3.  Medically necessary excisional debridement including muscle and deep fascial tissue, as well as portions of bone from the toe ulceration, was performed using a tissue nipper.  Excisional debridement of all the necrotic nonviable tissue down to healthy bleeding viable tissue was performed with post debridement measurement same as pre- 4.  Dry sterile dressing was applied.  Recommend that the patient keep the dressing clean dry and intact x1 week  5.  Continue postoperative shoe left foot 7.  Finish oral doxycycline 100 mg as prescribed 8.  Return to clinic in  1 week     Edrick Kins, DPM [facility]  Dr. Edrick Kins, DPM    [address], [place] [address]                Office [phone]  Fax [phone]

## 2019-03-28 ENCOUNTER — Encounter: Payer: Self-pay | Admitting: Podiatry

## 2019-03-28 ENCOUNTER — Ambulatory Visit (INDEPENDENT_AMBULATORY_CARE_PROVIDER_SITE_OTHER): Payer: Medicare Other | Admitting: Podiatry

## 2019-03-28 ENCOUNTER — Other Ambulatory Visit: Payer: Self-pay

## 2019-03-28 VITALS — Temp 97.3°F

## 2019-03-28 DIAGNOSIS — I83005 Varicose veins of unspecified lower extremity with ulcer other part of foot: Secondary | ICD-10-CM

## 2019-03-28 DIAGNOSIS — M2042 Other hammer toe(s) (acquired), left foot: Secondary | ICD-10-CM

## 2019-03-28 DIAGNOSIS — M21612 Bunion of left foot: Secondary | ICD-10-CM

## 2019-03-28 DIAGNOSIS — L97512 Non-pressure chronic ulcer of other part of right foot with fat layer exposed: Secondary | ICD-10-CM

## 2019-03-28 DIAGNOSIS — M21611 Bunion of right foot: Secondary | ICD-10-CM

## 2019-03-28 DIAGNOSIS — L97522 Non-pressure chronic ulcer of other part of left foot with fat layer exposed: Secondary | ICD-10-CM

## 2019-03-28 NOTE — Progress Notes (Signed)
HPI: 85 year old male with a history of multiple comorbidities presents the office today for follow-up evaluation of ulcers to the bilateral feet.  Last visit on 03/21/2019 primary closure was obtained using 4-0 suture to the left second toe.  Patient has been keeping the areas covered with a Band-Aid.  He presents for further treatment and evaluation  Past Medical History:  Diagnosis Date  . Arrhythmia    1995  . Atrial fibrillation ([place])   . CHF (congestive heart failure) ([place]) 10/13/2016  . Coronary artery disease    MI, atherectomy 1993  . DDD (degenerative disc disease)   . Eczema   . Elevated LFTs   . History of gallstones   . History of transient ischemic attack (TIA) 2005   double vision  . Hyperlipidemia   . Hypertension   . Pneumonia    long time ago  . Skin cancer    melanoma in remission  . Stroke [facility])      Physical Exam: General: The patient is alert and oriented x3 in no acute distress.  Dermatology: Ulcer noted the right first MTPJ measuring approximately 0.3 x 0.3 x 0.2 cm  Ulcer noted to the left second toe with suture intact.  Skin edges are slightly broken apart however everything appears to be somewhat intact.  Very mild erythema around the ulceration site  To the noted ulceration to the second toe left foot, the ulcer does probe to bone.  There was exposed bone noted to the head of the proximal phalanx of the second digit.  No malodor noted.  Erythema localized around the first MPJ of the right foot appears to be resolved.  Moderate amount of serous drainage noted.  Periwound integrity intact   There is some mild erythema localized around the area of the right first MTPJ.  Negative for malodor.  Wound base and granulation tissue noted with minimal fibrotic tissue.  Vascular: Palpable pedal pulses bilaterally.  Skin is warm to touch.  Moderate edema noted bilateral lower extremities.  Capillary refill within normal limits.  Neurological: Epicritic and  protective threshold diminished bilaterally.   Musculoskeletal Exam: History of right second toe amputation > 5 yrs prior.  There is also hammertoe deformity of the left second toe with dorsal dislocation at the MTPJ.  The second toe overlaps the great toe.  Hallux abductovalgus bunion deformity noted bilateral with hallux abductus greater than 40 degrees  Assessment: 1.  Ulcer bilateral feet with fat layer exposed  2.  Venous insufficiency bilateral lower extremities with varicosities noted 3.  Cellulitis right foot- improved 4.  Hammertoe deformity second digit left foot 5.  Bunion deformity bilateral   Plan of Care:  1. Patient evaluated. 2.  Medically necessary excisional debridement including muscle and deep fascial tissue was performed using a tissue nipper to the first MPJ of the right foot as well as the second digit of the left foot.  Excisional debridement of all necrotic nonviable tissue down to healthy bleeding viable tissue was performed with post debridement measurement same as pre-.  Additional bone resection was also performed using a tissue nipper to the ulceration to the left second toe. 3.  Resume good supportive sneakers.  Patient may discontinue postoperative shoes due to risk of falling which outweighs the benefit of the postoperative shoes. 4.  Resume antibiotic ointment and a Band-Aid daily to the bilateral areas 5.  Return to clinic in 1 week to remove the suture of the left second toe   Ruby Cola  Carver Fila, DPM [facility]  Dr. Edrick Kins, DPM    [address], [place] [address]                Office [phone]  Fax [phone]

## 2019-04-04 ENCOUNTER — Encounter: Payer: Self-pay | Admitting: Podiatry

## 2019-04-04 ENCOUNTER — Other Ambulatory Visit: Payer: Self-pay

## 2019-04-04 ENCOUNTER — Ambulatory Visit (INDEPENDENT_AMBULATORY_CARE_PROVIDER_SITE_OTHER): Payer: Medicare Other | Admitting: Podiatry

## 2019-04-04 VITALS — Temp 97.2°F

## 2019-04-04 DIAGNOSIS — M21612 Bunion of left foot: Secondary | ICD-10-CM

## 2019-04-04 DIAGNOSIS — L97522 Non-pressure chronic ulcer of other part of left foot with fat layer exposed: Secondary | ICD-10-CM

## 2019-04-04 DIAGNOSIS — I83005 Varicose veins of unspecified lower extremity with ulcer other part of foot: Secondary | ICD-10-CM

## 2019-04-04 DIAGNOSIS — R600 Localized edema: Secondary | ICD-10-CM

## 2019-04-04 DIAGNOSIS — M2042 Other hammer toe(s) (acquired), left foot: Secondary | ICD-10-CM

## 2019-04-04 DIAGNOSIS — M21611 Bunion of right foot: Secondary | ICD-10-CM

## 2019-04-05 ENCOUNTER — Telehealth: Payer: Self-pay | Admitting: *Deleted

## 2019-04-05 ENCOUNTER — Telehealth: Payer: Self-pay | Admitting: Podiatry

## 2019-04-05 ENCOUNTER — Encounter: Payer: Self-pay | Admitting: Podiatry

## 2019-04-05 NOTE — Telephone Encounter (Signed)
Extended orders for [facility].

## 2019-04-05 NOTE — Telephone Encounter (Signed)
Pt daughter wants to know can her father wear closed toe compression socks instead of open toe. Please call Arthur [name] with details

## 2019-04-05 NOTE — Telephone Encounter (Signed)
Closed toe is okay. Dr. Amalia Hailey

## 2019-04-05 NOTE — Telephone Encounter (Signed)
S/w HH nurse wanted to know if pt can stop wearing una boots and start wearing compression hose. Pt does have  Fluid blister on right calf and using zarafoam and a dry dressing.  Is the dry dressing ok with the compression hose. Sent to [place] to advise. Will call nurse back when recommendations are confirmed. Arthur [name] stated ok to try and if doesn't work out can remove.

## 2019-04-06 NOTE — Telephone Encounter (Signed)
Dr. Amalia Hailey recommendation sent to pt in [place].

## 2019-04-06 NOTE — Progress Notes (Signed)
HPI: 85 year old male with a history of multiple comorbidities presents the office today for follow-up evaluation of ulcers to the bilateral feet. HE reports some redness around the incision site of the left 2nd toe. He has been using Betadine daily with a bandage as well as wearing the post op shoes. There are no modifying factors noted. Patient is here for further evaluation and treatment.   Past Medical History:  Diagnosis Date  . Arrhythmia    1995  . Atrial fibrillation ([place])   . CHF (congestive heart failure) ([place]) 10/13/2016  . Coronary artery disease    MI, atherectomy 1993  . DDD (degenerative disc disease)   . Eczema   . Elevated LFTs   . History of gallstones   . History of transient ischemic attack (TIA) 2005   double vision  . Hyperlipidemia   . Hypertension   . Pneumonia    long time ago  . Skin cancer    melanoma in remission  . Stroke [facility])      Physical Exam: General: The patient is alert and oriented x3 in no acute distress.  Dermatology: Ulcer noted the right first MTPJ measuring approximately 0.3 x 0.3 x 0.2 cm  Ulcer noted to the left second toe with suture intact.  Skin edges are slightly broken apart however everything appears to be somewhat intact.  Very mild erythema around the ulceration site  To the noted ulceration to the second toe left foot, the ulcer does probe to bone.  There was exposed bone noted to the head of the proximal phalanx of the second digit.  No malodor noted.  Erythema localized around the first MPJ of the right foot appears to be resolved.  Moderate amount of serous drainage noted.  Periwound integrity intact   There is some mild erythema localized around the area of the right first MTPJ.  Negative for malodor.  Wound base and granulation tissue noted with minimal fibrotic tissue.  Vascular: Palpable pedal pulses bilaterally.  Skin is warm to touch.  Moderate edema noted bilateral lower extremities.  Capillary refill within  normal limits.  Neurological: Epicritic and protective threshold diminished bilaterally.   Musculoskeletal Exam: History of right second toe amputation > 5 yrs prior.  There is also hammertoe deformity of the left second toe with dorsal dislocation at the MTPJ.  The second toe overlaps the great toe.  Hallux abductovalgus bunion deformity noted bilateral with hallux abductus greater than 40 degrees  Assessment: 1.  Ulcer bilateral feet with fat layer exposed  2.  Venous insufficiency bilateral lower extremities with varicosities noted 3.  Cellulitis right foot- improved 4.  Hammertoe deformity second digit left foot 5.  Bunion deformity bilateral   Plan of Care:  1. Patient evaluated. 2. 2. Medically necessary excisional debridement including subcutaneous tissue was performed using a tissue nipper and a chisel blade. Excisional debridement of all the necrotic nonviable tissue down to healthy bleeding viable tissue was performed with post-debridement measurements same as pre-. 3. The wound was cleansed and dry sterile dressing applied. 4. Continue using Betadine daily with a bandage.  5. Continue using post op shoes bilaterally.  6. Return to clinic in 3 weeks.    Edrick Kins, DPM [facility]  Dr. Edrick Kins, DPM    [address]  [place], [place] [address]                Office [phone]  Fax [phone]

## 2019-04-08 ENCOUNTER — Other Ambulatory Visit: Payer: Self-pay

## 2019-04-08 MED ORDER — PREDNISONE 5 MG PO TABS: 10.0000 mg | ORAL_TABLET | Freq: Every day | ORAL | 0 refills | Status: DC

## 2019-04-12 ENCOUNTER — Ambulatory Visit (INDEPENDENT_AMBULATORY_CARE_PROVIDER_SITE_OTHER): Payer: Medicare Other | Admitting: Internal Medicine

## 2019-04-12 ENCOUNTER — Telehealth: Payer: Self-pay | Admitting: *Deleted

## 2019-04-12 ENCOUNTER — Telehealth: Payer: Self-pay | Admitting: Nurse Practitioner

## 2019-04-12 ENCOUNTER — Encounter: Payer: Self-pay | Admitting: Internal Medicine

## 2019-04-12 ENCOUNTER — Telehealth: Payer: Self-pay | Admitting: Emergency Medicine

## 2019-04-12 ENCOUNTER — Other Ambulatory Visit: Payer: Self-pay

## 2019-04-12 VITALS — BP 140/80 | Temp 97.5°F | Ht 74.0 in | Wt 169.0 lb

## 2019-04-12 DIAGNOSIS — M7989 Other specified soft tissue disorders: Secondary | ICD-10-CM | POA: Diagnosis not present

## 2019-04-12 DIAGNOSIS — R0902 Hypoxemia: Secondary | ICD-10-CM | POA: Insufficient documentation

## 2019-04-12 DIAGNOSIS — R739 Hyperglycemia, unspecified: Secondary | ICD-10-CM | POA: Diagnosis not present

## 2019-04-12 DIAGNOSIS — I2510 Atherosclerotic heart disease of native coronary artery without angina pectoris: Secondary | ICD-10-CM

## 2019-04-12 DIAGNOSIS — F4321 Adjustment disorder with depressed mood: Secondary | ICD-10-CM

## 2019-04-12 DIAGNOSIS — R410 Disorientation, unspecified: Secondary | ICD-10-CM | POA: Insufficient documentation

## 2019-04-12 NOTE — Assessment & Plan Note (Signed)
Afeb, has non prod cough, exam benign but cant r/o TME for some reason as well as the hypoxia

## 2019-04-12 NOTE — Telephone Encounter (Signed)
I would probably recommend that he wait and see Dr. Jenny Reichmann at 4pm. If he is so lethargic or sick that he cannot wait 2 hours he may need evaluation in ER instead.

## 2019-04-12 NOTE — Telephone Encounter (Signed)
Received text from daughter Santiago Glad this past Sunday - concern regarding [place] is not sleeping at night - getting confused in the afternoon and then getting so sleepy it is difficult to stay awake. Oxygen sats are in the mid 90's. Does not seem to have worsening shortness of breath.   Would need to be concerned for possible infection - I.e. UTI - but suspect most of this may be "sundowning".   Have encouraged them to reach out to primary care for further discussion.   [name], RN, [place] [address] [place], [place]  [address] [phone]

## 2019-04-12 NOTE — Telephone Encounter (Signed)
Received call from [place]. States pt does have venous insufficiency but the highest O2 reading the nurse could get was 88%. Pt is also having Confusion and tiredness. Pt was to come in this afternoon at 4 to see Dr Jenny Reichmann, pt's daughter would like pt to be see early. Dr Jenny Reichmann is full for the afternoon. Are you okay with seeing pt?

## 2019-04-12 NOTE — Patient Instructions (Signed)
OK to go to the ED at [facility] for further low oxygen, confusion and leg swelling evaluation  Please continue all other medications as before, and refills have been done if requested.  Please have the pharmacy call with any other refills you may need.  Please keep your appointments with your specialists as you may have planned

## 2019-04-12 NOTE — Telephone Encounter (Signed)
  Daughter is calling and would like to speak with Andee Poles again

## 2019-04-12 NOTE — Telephone Encounter (Signed)
S/w pt's daughter per (DPR). Wanted to get advice due to [facility] being at pt's house today. HH could not get pt's 02 sats today and when finally got the sats pt was at 86.   After awhile pt's 02 went up to 92.HH suggested that pt go to ER and HH also pt's stated pt looked blue,  daughter wanted [name] to  advise.  . Daughter stated did not see that pt was blue and HH was making daughter nervous.  Pt has appt today with Dr. Gwynn Burly office and will keep this appt. Stated to make sure you tell office what [place] stated today.  Advised with [name] for pt to keep PCP appt today.  Will send to [place] to [place].

## 2019-04-12 NOTE — Telephone Encounter (Signed)
Noted  

## 2019-04-12 NOTE — Assessment & Plan Note (Signed)
Clearly worse but wieghts appear unreliable; suspect likely chf , would recommend BNP with labs

## 2019-04-12 NOTE — Telephone Encounter (Signed)
Noted - agree with seeing PCP today. Can have pulse ox rechecked there.   [name]

## 2019-04-12 NOTE — Assessment & Plan Note (Addendum)
Sat monitor variable and seems unreliable, and as low as 61 here in the office; will need to refer to [facility] ED, will likely need ABG, cxr and lab evaluation  Note:  Total time for pt hx, exam, review of record with pt in the room, determination of diagnoses and plan for further eval and tx is > 40 min, with over 50% spent in coordination and counseling of patient including the differential dx, tx, further evaluation and other management of hypoxia, confusion, leg swelling, hyperglycemia, grief

## 2019-04-12 NOTE — Telephone Encounter (Signed)
Follow up    Per the previous messages patient's daughter has questions about going to the er. Please call.

## 2019-04-12 NOTE — Telephone Encounter (Signed)
S/w pt's daughter per (DPR).  [name] wanted to check to see if pt got appt with Dr.[name] per last conversation.  Pt's daughter will call PCP this am to get pt appt this week.  Will send to [place] to [place].

## 2019-04-12 NOTE — Telephone Encounter (Signed)
Spoke with pt's daughter and she is okay with waiting until 4. State they were able to get a better reading of 92% O2 sat.

## 2019-04-12 NOTE — Progress Notes (Signed)
Subjective:    Patient ID: Arthur [name], male    DOB: 05-Dec-1924, 85 y.o.   MRN: [id number]  HPI  Here with daughter with c/o worsening confusion x 2 wks and low o2 sat for last 2 days; low sat is corroborated by todays values though there is some difficulty with obtaining a stable accurate figure.  Pt denies chest pain, increased sob or doe, wheezing, orthopnea, PND, increased LE swelling, palpitations, dizziness or syncope.  Pt denies new neurological symptoms such as new headache, or facial or extremity weakness or numbness.   Pt denies polydipsia, polyuria.  Has ongoing issue with sleeping difficulty and grief after wife died Mar 06, 2023 with MI Wt Readings from Last 3 Encounters:  04/12/19 169 lb (76.7 kg)  03/21/19 167 lb (75.8 kg)  03/21/19 167 lb (75.8 kg)   Past Medical History:  Diagnosis Date  . Arrhythmia    1995  . Atrial fibrillation ([place])   . CHF (congestive heart failure) ([place]) 10/13/2016  . Coronary artery disease    MI, atherectomy 1993  . DDD (degenerative disc disease)   . Eczema   . Elevated LFTs   . History of gallstones   . History of transient ischemic attack (TIA) 2005   double vision  . Hyperlipidemia   . Hypertension   . Pneumonia    long time ago  . Skin cancer    melanoma in remission  . Stroke [facility])    Past Surgical History:  Procedure Laterality Date  . AMPUTATION Right 05/10/2013   Procedure: RIGHT SECOND TOE AMPUTATION;  Surgeon: Hessie Dibble, MD;  Location: [place];  Service: Orthopedics;  Laterality: Right;  . APPENDECTOMY    . CARDIAC CATHETERIZATION  04/08/99   LAD 70% INTER 40-50% RCA 30-40%  . CHEST TUBE INSERTION Right 02/16/2017   Procedure: INSERTION PLEURAL DRAINAGE CATHETER;  Surgeon: Grace Isaac, MD;  Location: [place];  Service: Thoracic;  Laterality: Right;  . CORONARY ANGIOPLASTY  04/08/99   LAD  . CORONARY ANGIOPLASTY  10/17/97   RCA   . CORONARY ANGIOPLASTY WITH STENT PLACEMENT  04/08/99   LAD  . IR  THORACENTESIS ASP PLEURAL SPACE W/IMG GUIDE  11/22/2018  . MELANOMA EXCISION  2013   lt arm  . MELANOMA EXCISION Left 08/23/2013   Procedure: MELANOMA EXCISION;  Surgeon: Pedro Earls, MD;  Location: [facility];  Service: General;  Laterality: Left;  . REMOVAL OF PLEURAL DRAINAGE CATHETER Right 05/07/2017   Procedure: REMOVAL OF PLEURAL DRAINAGE CATHETER;  Surgeon: Grace Isaac, MD;  Location: [place];  Service: Thoracic;  Laterality: Right;  . TONSILLECTOMY    . VIDEO BRONCHOSCOPY Bilateral 05/07/2015   Procedure: VIDEO BRONCHOSCOPY WITHOUT FLUORO;  Surgeon: Rigoberto Noel, MD;  Location: [place];  Service: Cardiopulmonary;  Laterality: Bilateral;    reports that he quit smoking about 51 years ago. His smoking use included cigars and pipe. He quit after 7.00 years of use. He has never used smokeless tobacco. He reports current alcohol use of about 1.0 standard drinks of alcohol per week. He reports that he does not use drugs. family history includes Heart attack (age of onset: 50) in his mother; Pneumonia in his father. Allergies  Allergen Reactions  . Penicillins Hives    Has patient had a PCN reaction causing immediate rash, facial/tongue/throat swelling, SOB or lightheadedness with hypotension:Yes Has patient had a PCN reaction causing severe rash involving mucus membranes or skin necrosis: No Has patient had  a PCN reaction that required hospitalization: No Has patient had a PCN reaction occurring within the last 10 years: No If all of the above answers are "NO", then may proceed with Cephalosporin use.    Current Outpatient Medications on File Prior to Visit  Medication Sig Dispense Refill  . Albuterol Sulfate (PROAIR RESPICLICK) 366 (90 Base) MCG/ACT AEPB Inhale 1 puff into the lungs every 4 (four) hours as needed (SOB). 30 each 0  . apixaban (ELIQUIS) 2.5 MG TABS tablet Take 1 tablet (2.5 mg total) by mouth 2 (two) times daily. 60 tablet 5  . Ascorbic Acid (VITAMIN C PO) Take 1  tablet by mouth daily.    . Cholecalciferol (VITAMIN D3) 5000 units CAPS Take 5,000 Units by mouth daily.    . furosemide (LASIX) 20 MG tablet Take 1 tablet (20 mg total) by mouth every evening. 90 tablet 3  . furosemide (LASIX) 40 MG tablet Take 1 tablet (40 mg total) by mouth every morning. 90 tablet 3  . metoprolol tartrate (LOPRESSOR) 25 MG tablet Take 0.5 tablets (12.5 mg total) by mouth 2 (two) times daily. 60 tablet 0  . UNABLE TO FIND Med Name: [employer] Tart daily    . VITAMIN A PO Take 1 capsule by mouth daily.     No current facility-administered medications on file prior to visit.    Review of Systems  Constitutional: Negative for other unusual diaphoresis or sweats HENT: Negative for ear discharge or swelling Eyes: Negative for other worsening visual disturbances Respiratory: Negative for stridor or other swelling  Gastrointestinal: Negative for worsening distension or other blood Genitourinary: Negative for retention or other urinary change Musculoskeletal: Negative for other MSK pain or swelling Skin: Negative for color change or other new lesions Neurological: Negative for worsening tremors and other numbness  Psychiatric/Behavioral: Negative for worsening agitation or other fatigue All other system neg per pt    Objective:   Physical Exam BP 140/80 (BP Location: Left Arm, Patient Position: Sitting, Cuff Size: Normal)   Temp (!) 97.5 F (36.4 C) (Oral)   Ht 6\' 2"  (1.88 m)   Wt 169 lb (76.7 kg)   BMI 21.70 kg/m , O2 sat on RA at rest - 86% VS noted,  Constitutional: Pt appears in NAD HENT: Head: NCAT.  Right Ear: External ear normal.  Left Ear: External ear normal.  Eyes: . Pupils are equal, round, and reactive to light. Conjunctivae and EOM are normal Nose: without d/c or deformity Neck: Neck supple. Gross normal ROM Cardiovascular: Normal rate and regular rhythm.   Pulmonary/Chest: Effort normal and breath sounds without rales or wheezing.  Abd:  Soft, NT,  ND, +BS Neurological: Pt is alert. Mild confused with baseine difficult, motor grossly intact Skin: Skin is warm. No rashes, other new lesions, 2+ bilat LE edema Psychiatric: Pt behavior is normal without agitation but slowed mentation and moveements, mild dysphoric, flat affect No other exam findings Lab Results  Component Value Date   WBC 9.4 04/13/2019   HGB 12.6 (L) 04/13/2019   HCT 38.1 04/13/2019   PLT 168 04/13/2019   GLUCOSE 124 (H) 04/13/2019   CHOL 192 08/18/2016   TRIG 93.0 08/18/2016   HDL 58.10 08/18/2016   LDLCALC 115 (H) 08/18/2016   ALT 20 11/30/2018   AST 27 11/30/2018   NA 141 04/13/2019   K 4.3 04/13/2019   CL 96 04/13/2019   CREATININE 1.48 (H) 04/13/2019   BUN 34 04/13/2019   CO2 28 04/13/2019  TSH 1.45 08/18/2016   PSA 5.69 (H) 10/09/2009   INR 1.38 11/21/2018   HGBA1C 5.9 (H) 02/01/2016      Assessment & Plan:

## 2019-04-13 ENCOUNTER — Telehealth: Payer: Self-pay | Admitting: *Deleted

## 2019-04-13 ENCOUNTER — Other Ambulatory Visit: Payer: Self-pay | Admitting: Nurse Practitioner

## 2019-04-13 ENCOUNTER — Telehealth: Payer: Self-pay | Admitting: Nurse Practitioner

## 2019-04-13 ENCOUNTER — Ambulatory Visit
Admission: RE | Admit: 2019-04-13 | Discharge: 2019-04-13 | Disposition: A | Discharge status: Home / Self Care | Payer: Medicare Other | Source: Ambulatory Visit | Attending: Nurse Practitioner | Admitting: Nurse Practitioner

## 2019-04-13 ENCOUNTER — Other Ambulatory Visit: Payer: Medicare Other | Admitting: *Deleted

## 2019-04-13 DIAGNOSIS — R410 Disorientation, unspecified: Secondary | ICD-10-CM

## 2019-04-13 DIAGNOSIS — R0602 Shortness of breath: Secondary | ICD-10-CM

## 2019-04-13 LAB — URINALYSIS
Bilirubin, UA: NEGATIVE
Glucose, UA: NEGATIVE
Ketones, UA: NEGATIVE
Leukocytes,UA: NEGATIVE
Nitrite, UA: NEGATIVE
RBC, UA: NEGATIVE
Specific Gravity, UA: 1.018 (ref 1.005–1.030)
Urobilinogen, Ur: 1 mg/dL (ref 0.2–1.0)
pH, UA: 5.5 (ref 5.0–7.5)

## 2019-04-13 MED ORDER — DOXYCYCLINE HYCLATE 100 MG PO CAPS: 100.0000 mg | ORAL_CAPSULE | Freq: Two times a day (BID) | ORAL | 0 refills | Status: AC

## 2019-04-13 NOTE — Progress Notes (Signed)
See CXR note

## 2019-04-13 NOTE — Telephone Encounter (Signed)
Noted  

## 2019-04-13 NOTE — Telephone Encounter (Signed)
They wish to stay out of the ER if at all possible. I will facilitate getting labs, UA and CXR.   [name], RN, [place] [address] [place], [place]  [address] [phone]

## 2019-04-13 NOTE — Telephone Encounter (Signed)
S/w pt's daughter per [facility]) is aware of Lori's suggestions,  pt needs to go to ER to be worked up; labs, cxr, and ua for confusion and hypoxia, [name]  prefers the hospital on  68 but explained to daughter if pt gets admitted pt will be transported by ambulance to [facility]. Daughter stated pt is sleeping but will take pt to hospital today. Will send to [place] to [place].

## 2019-04-13 NOTE — Telephone Encounter (Signed)
S/w [name] about pt and SATS that were all over the place, daughter stated pt's SATS if warms up hand is in the 90's.  Changed treatment plans, pt will now go into office and have bmet/cbc/BNP/UA/ urine culture. Pt will go to [facility] for a CXR.  Pt's daughter and pt were relieved.  All orders placed in system, appt made and linked.  Will send to [place] to [place].

## 2019-04-13 NOTE — Telephone Encounter (Signed)
thx

## 2019-04-13 NOTE — Telephone Encounter (Signed)
Patient daughter called again, please call her at above number.

## 2019-04-14 ENCOUNTER — Other Ambulatory Visit: Payer: Self-pay

## 2019-04-14 LAB — CBC
Hematocrit: 38.1 % (ref 37.5–51.0)
Hemoglobin: 12.6 g/dL — ABNORMAL LOW (ref 13.0–17.7)
MCH: 32.6 pg (ref 26.6–33.0)
MCHC: 33.1 g/dL (ref 31.5–35.7)
MCV: 99 fL — ABNORMAL HIGH (ref 79–97)
Platelets: 168 10*3/uL (ref 150–450)
RBC: 3.86 x10E6/uL — ABNORMAL LOW (ref 4.14–5.80)
RDW: 14.2 % (ref 11.6–15.4)
WBC: 9.4 10*3/uL (ref 3.4–10.8)

## 2019-04-14 LAB — BASIC METABOLIC PANEL
BUN/Creatinine Ratio: 23 (ref 10–24)
BUN: 34 mg/dL (ref 10–36)
CO2: 28 mmol/L (ref 20–29)
Calcium: 9.3 mg/dL (ref 8.6–10.2)
Chloride: 96 mmol/L (ref 96–106)
Creatinine, Ser: 1.48 mg/dL — ABNORMAL HIGH (ref 0.76–1.27)
GFR calc Af Amer: 47 mL/min/{1.73_m2} — ABNORMAL LOW (ref >59)
GFR calc non Af Amer: 41 mL/min/{1.73_m2} — ABNORMAL LOW (ref >59)
Glucose: 124 mg/dL — ABNORMAL HIGH (ref 65–99)
Potassium: 4.3 mmol/L (ref 3.5–5.2)
Sodium: 141 mmol/L (ref 134–144)

## 2019-04-14 LAB — PRO B NATRIURETIC PEPTIDE: NT-Pro BNP: 6505 pg/mL — ABNORMAL HIGH (ref 0–486)

## 2019-04-14 MED ORDER — PREDNISONE 5 MG PO TABS: 10.0000 mg | ORAL_TABLET | Freq: Every day | ORAL | 0 refills | Status: AC

## 2019-04-14 NOTE — Telephone Encounter (Signed)
Pt called back and wanted to speak with Andee Poles

## 2019-04-14 NOTE — Telephone Encounter (Signed)
thanks

## 2019-04-15 LAB — URINE CULTURE

## 2019-04-15 NOTE — Telephone Encounter (Signed)
Follow Up;    Please call, she needs to talk tou about pt's condition.

## 2019-04-15 NOTE — Telephone Encounter (Signed)
S/w pts daughter per (DPR). Is concern about pts wound on left calf that is the size of a eraser and weeping. T/w Adela Lank, RN with [facility] yesterday to send nurse out to address pts' wound.  Daughter stated PT came out to assess wound due to being short of staff.  Gala Murdoch from [facility] did come out today and advised with [name]  a verbal order pt was given, will place collagen in the depth of the wound and will use foam dressing on both legs. This material has to be ordered.  Daughter also wanted to apologize for the mychart message that was sent by one of the siblings. Also stated pt was very agitated with the healing process of pt's legs and wanted a telehealth visit next week with Truitt Merle, Np.  Will call daughter on Monday to schedule.

## 2019-04-17 ENCOUNTER — Encounter: Payer: Self-pay | Admitting: Internal Medicine

## 2019-04-17 DIAGNOSIS — F4321 Adjustment disorder with depressed mood: Secondary | ICD-10-CM | POA: Insufficient documentation

## 2019-04-17 NOTE — Assessment & Plan Note (Signed)
stable overall by history and exam, recent data reviewed with pt, and pt to continue medical treatment as before,  to f/u any worsening symptoms or concerns  

## 2019-04-17 NOTE — Assessment & Plan Note (Signed)
D/w pt, declines counseling at this time due to other comorbids

## 2019-04-18 NOTE — Telephone Encounter (Signed)
S/w daughter stated pt was better yesterday due to fluid decreasing.  Pt was happy able to get shoes on. Will have a telehealth visit tomorrow due to pt's request.
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
## 2019-04-18 NOTE — Telephone Encounter (Signed)
Virtual Visit Pre-Appointment Phone Call  "(Name), I am calling you today to discuss your upcoming appointment. We are currently trying to limit exposure to the virus that causes COVID-19 by seeing patients at home rather than in the office."  1. "What is the BEST phone number to call the day of the visit?" - include this in appointment notes  2. "Do you have or have access to (through a family member/friend) a smartphone with video capability that we can use for your visit?" a. If yes - list this number in appt notes as "cell" (if different from BEST phone #) and list the appointment type as a VIDEO visit in appointment notes b. If no - list the appointment type as a PHONE visit in appointment notes  3. Confirm consent - "In the setting of the current Covid19 crisis, you are scheduled for a (phone or video) visit with your provider on (Tuesday, June 2) at (12:30 pm ).  Just as we do with many in-office visits, in order for you to participate in this visit, we must obtain consent.  If you'd like, I can send this to your mychart (if signed up) or email for you to review.  Otherwise, I can obtain your verbal consent now.  All virtual visits are billed to your insurance company just like a normal visit would be.  By agreeing to a virtual visit, we'd like you to understand that the technology does not allow for your provider to perform an examination, and thus may limit your provider's ability to fully assess your condition. If your provider identifies any concerns that need to be evaluated in person, we will make arrangements to do so.  Finally, though the technology is pretty good, we cannot assure that it will always work on either your or our end, and in the setting of a video visit, we may have to convert it to a phone-only visit.  In either situation, we cannot ensure that we have a secure connection.  Are you willing to proceed?" STAFF: Did the patient verbally acknowledge consent to telehealth  visit? Document YES/NO here: YES  4. Advise patient to be prepared - "Two hours prior to your appointment, go ahead and check your blood pressure, pulse, oxygen saturation, and your weight (if you have the equipment to check those) and write them all down. When your visit starts, your provider will ask you for this information. If you have an Apple Watch or Kardia device, please plan to have heart rate information ready on the day of your appointment. Please have a pen and paper handy nearby the day of the visit as well."  5. Give patient instructions for MyChart download to smartphone OR Doximity/Doxy.me as below if video visit (depending on what platform provider is using)  6. Inform patient they will receive a phone call 15 minutes prior to their appointment time (may be from unknown caller ID) so they should be prepared to answer    TELEPHONE CALL NOTE  [name] has been deemed a candidate for a follow-up tele-health visit to limit community exposure during the Covid-19 pandemic. I spoke with the patient via phone to ensure availability of phone/video source, confirm preferred email & phone number, and discuss instructions and expectations.  I reminded [facility] [name] to be prepared with any vital sign and/or heart rhythm information that could potentially be obtained via home monitoring, at the time of his visit. I reminded [name] to expect a  phone call prior to his visit.  [name] Arthur [name] 04/18/2019 8:45 AM   INSTRUCTIONS FOR DOWNLOADING THE MYCHART APP TO SMARTPHONE  - The patient must first make sure to have activated MyChart and know their login information - If Apple, go to [employer] and type in MyChart in the search bar and download the app. If Android, ask patient to go to [employer] and type in [place] in the search bar and download the app. The app is free but as with any other app downloads, their phone may require them to verify saved payment  information or Apple/Android password.  - The patient will need to then log into the app with their MyChart username and password, and select [facility] as their healthcare provider to link the account. When it is time for your visit, go to the MyChart app, find appointments, and click Begin Video Visit. Be sure to Select Allow for your device to access the Microphone and Camera for your visit. You will then be connected, and your provider will be with you shortly.  **If they have any issues connecting, or need assistance please contact MyChart service desk [phone])**  **If using a computer, in order to ensure the best quality for their visit they will need to use either of the following Internet Browsers: [employer], or Google Chrome**  IF USING DOXIMITY or DOXY.ME - The patient will receive a link just prior to their visit by text.     FULL LENGTH CONSENT FOR TELE-HEALTH VISIT   I hereby voluntarily request, consent and authorize [facility] and its employed or contracted physicians, physician assistants, nurse practitioners or other licensed health care professionals (the Practitioner), to provide me with telemedicine health care services (the "Services") as deemed necessary by the treating Practitioner. I acknowledge and consent to receive the Services by the Practitioner via telemedicine. I understand that the telemedicine visit will involve communicating with the Practitioner through live audiovisual communication technology and the disclosure of certain medical information by electronic transmission. I acknowledge that I have been given the opportunity to request an in-person assessment or other available alternative prior to the telemedicine visit and am voluntarily participating in the telemedicine visit.  I understand that I have the right to withhold or withdraw my consent to the use of telemedicine in the course of my care at any time, without affecting my right  to future care or treatment, and that the Practitioner or I may terminate the telemedicine visit at any time. I understand that I have the right to inspect all information obtained and/or recorded in the course of the telemedicine visit and may receive copies of available information for a reasonable fee.  I understand that some of the potential risks of receiving the Services via telemedicine include:  Marland Kitchen Delay or interruption in medical evaluation due to technological equipment failure or disruption; . Information transmitted may not be sufficient (e.g. poor resolution of images) to allow for appropriate medical decision making by the Practitioner; and/or  . In rare instances, security protocols could fail, causing a breach of personal health information.  Furthermore, I acknowledge that it is my responsibility to provide information about my medical history, conditions and care that is complete and accurate to the best of my ability. I acknowledge that Practitioner's advice, recommendations, and/or decision may be based on factors not within their control, such as incomplete or inaccurate data provided by me or distortions of diagnostic images or specimens that may result  from electronic transmissions. I understand that the practice of medicine is not an exact science and that Practitioner makes no warranties or guarantees regarding treatment outcomes. I acknowledge that I will receive a copy of this consent concurrently upon execution via email to the email address I last provided but may also request a printed copy by calling the office of [facility].    I understand that my insurance will be billed for this visit.   I have read or had this consent read to me. . I understand the contents of this consent, which adequately explains the benefits and risks of the Services being provided via telemedicine.  . I have been provided ample opportunity to ask questions regarding this consent and the Services  and have had my questions answered to my satisfaction. . I give my informed consent for the services to be provided through the use of telemedicine in my medical care  By participating in this telemedicine visit I agree to the above.

## 2019-04-19 ENCOUNTER — Other Ambulatory Visit: Payer: Self-pay | Admitting: Nurse Practitioner

## 2019-04-19 ENCOUNTER — Observation Stay (HOSPITAL_COMMUNITY)
Admission: EM | Admit: 2019-04-19 | Discharge: 2019-04-20 | Discharge status: Against Medical Advice | Payer: Medicare Other | Attending: Internal Medicine | Admitting: Internal Medicine

## 2019-04-19 ENCOUNTER — Encounter: Payer: Self-pay | Admitting: Nurse Practitioner

## 2019-04-19 ENCOUNTER — Other Ambulatory Visit: Payer: Self-pay

## 2019-04-19 ENCOUNTER — Telehealth (INDEPENDENT_AMBULATORY_CARE_PROVIDER_SITE_OTHER): Payer: Medicare Other | Admitting: Nurse Practitioner

## 2019-04-19 VITALS — Ht 72.0 in | Wt 160.0 lb

## 2019-04-19 DIAGNOSIS — Z89421 Acquired absence of other right toe(s): Secondary | ICD-10-CM | POA: Diagnosis not present

## 2019-04-19 DIAGNOSIS — Z1159 Encounter for screening for other viral diseases: Secondary | ICD-10-CM | POA: Diagnosis not present

## 2019-04-19 DIAGNOSIS — R441 Visual hallucinations: Secondary | ICD-10-CM | POA: Diagnosis not present

## 2019-04-19 DIAGNOSIS — R0602 Shortness of breath: Secondary | ICD-10-CM

## 2019-04-19 DIAGNOSIS — Z85820 Personal history of malignant melanoma of skin: Secondary | ICD-10-CM | POA: Insufficient documentation

## 2019-04-19 DIAGNOSIS — R410 Disorientation, unspecified: Secondary | ICD-10-CM

## 2019-04-19 DIAGNOSIS — N183 Chronic kidney disease, stage 3 unspecified: Secondary | ICD-10-CM | POA: Diagnosis present

## 2019-04-19 DIAGNOSIS — Z79899 Other long term (current) drug therapy: Secondary | ICD-10-CM

## 2019-04-19 DIAGNOSIS — M7989 Other specified soft tissue disorders: Secondary | ICD-10-CM | POA: Insufficient documentation

## 2019-04-19 DIAGNOSIS — I2510 Atherosclerotic heart disease of native coronary artery without angina pectoris: Secondary | ICD-10-CM | POA: Insufficient documentation

## 2019-04-19 DIAGNOSIS — Z7901 Long term (current) use of anticoagulants: Secondary | ICD-10-CM | POA: Insufficient documentation

## 2019-04-19 DIAGNOSIS — I5032 Chronic diastolic (congestive) heart failure: Secondary | ICD-10-CM | POA: Diagnosis not present

## 2019-04-19 DIAGNOSIS — Z7189 Other specified counseling: Secondary | ICD-10-CM

## 2019-04-19 DIAGNOSIS — Z8249 Family history of ischemic heart disease and other diseases of the circulatory system: Secondary | ICD-10-CM | POA: Insufficient documentation

## 2019-04-19 DIAGNOSIS — N179 Acute kidney failure, unspecified: Secondary | ICD-10-CM | POA: Diagnosis present

## 2019-04-19 DIAGNOSIS — R7989 Other specified abnormal findings of blood chemistry: Secondary | ICD-10-CM | POA: Diagnosis present

## 2019-04-19 DIAGNOSIS — J449 Chronic obstructive pulmonary disease, unspecified: Secondary | ICD-10-CM | POA: Insufficient documentation

## 2019-04-19 DIAGNOSIS — Z7951 Long term (current) use of inhaled steroids: Secondary | ICD-10-CM | POA: Insufficient documentation

## 2019-04-19 DIAGNOSIS — Z7952 Long term (current) use of systemic steroids: Secondary | ICD-10-CM | POA: Insufficient documentation

## 2019-04-19 DIAGNOSIS — E785 Hyperlipidemia, unspecified: Secondary | ICD-10-CM | POA: Diagnosis not present

## 2019-04-19 DIAGNOSIS — I130 Hypertensive heart and chronic kidney disease with heart failure and stage 1 through stage 4 chronic kidney disease, or unspecified chronic kidney disease: Principal | ICD-10-CM | POA: Insufficient documentation

## 2019-04-19 DIAGNOSIS — Z87891 Personal history of nicotine dependence: Secondary | ICD-10-CM | POA: Diagnosis not present

## 2019-04-19 DIAGNOSIS — Z955 Presence of coronary angioplasty implant and graft: Secondary | ICD-10-CM | POA: Insufficient documentation

## 2019-04-19 DIAGNOSIS — K754 Autoimmune hepatitis: Secondary | ICD-10-CM | POA: Insufficient documentation

## 2019-04-19 DIAGNOSIS — R778 Other specified abnormalities of plasma proteins: Secondary | ICD-10-CM | POA: Diagnosis present

## 2019-04-19 DIAGNOSIS — Z8673 Personal history of transient ischemic attack (TIA), and cerebral infarction without residual deficits: Secondary | ICD-10-CM | POA: Diagnosis not present

## 2019-04-19 DIAGNOSIS — Z880 Allergy status to penicillin: Secondary | ICD-10-CM | POA: Insufficient documentation

## 2019-04-19 DIAGNOSIS — I493 Ventricular premature depolarization: Secondary | ICD-10-CM | POA: Diagnosis not present

## 2019-04-19 DIAGNOSIS — I5033 Acute on chronic diastolic (congestive) heart failure: Secondary | ICD-10-CM | POA: Diagnosis present

## 2019-04-19 DIAGNOSIS — L97919 Non-pressure chronic ulcer of unspecified part of right lower leg with unspecified severity: Secondary | ICD-10-CM | POA: Diagnosis present

## 2019-04-19 DIAGNOSIS — I4821 Permanent atrial fibrillation: Secondary | ICD-10-CM

## 2019-04-19 DIAGNOSIS — R440 Auditory hallucinations: Secondary | ICD-10-CM | POA: Insufficient documentation

## 2019-04-19 DIAGNOSIS — I447 Left bundle-branch block, unspecified: Secondary | ICD-10-CM | POA: Diagnosis not present

## 2019-04-19 DIAGNOSIS — L97929 Non-pressure chronic ulcer of unspecified part of left lower leg with unspecified severity: Secondary | ICD-10-CM | POA: Diagnosis present

## 2019-04-19 DIAGNOSIS — I10 Essential (primary) hypertension: Secondary | ICD-10-CM | POA: Diagnosis present

## 2019-04-19 DIAGNOSIS — I4891 Unspecified atrial fibrillation: Secondary | ICD-10-CM | POA: Diagnosis not present

## 2019-04-19 MED ORDER — METOPROLOL TARTRATE 25 MG PO TABS: 12.5000 mg | ORAL_TABLET | Freq: Two times a day (BID) | ORAL | 3 refills | Status: AC

## 2019-04-19 NOTE — ED Triage Notes (Signed)
Daughter stated. For the last week he has had some confusion and some hallucinations. A couple months he fell and did not see anyone medically. He takes Blood thinners.  6 weeks ago he lost his wife.

## 2019-04-19 NOTE — Progress Notes (Signed)
See OV note from today.

## 2019-04-19 NOTE — Patient Instructions (Addendum)
After Visit Summary:  We will be checking the following labs today - NONE  We will check BMET/CBC when you go for your CT scan   Medication Instructions:    Continue with your current medicines for now.    If you need a refill on your cardiac medications before your next appointment, please call your pharmacy.     Testing/Procedures To Be Arranged:  CT of the head - today or tomorrow with labs also  Follow-Up:   See me towards the end of June - most likely telehealth visit.     At [facility], you and your health needs are our priority.  As part of our continuing mission to provide you with exceptional heart care, we have created designated Provider Care Teams.  These Care Teams include your primary Cardiologist (physician) and Advanced Practice Providers (APPs -  Physician Assistants and Nurse Practitioners) who all work together to provide you with the care you need, when you need it.  Special Instructions:  . Stay safe, stay home, wash your hands for at least 20 seconds and wear a mask when out in public.  . It was good to talk with all of you today.    Call the [place] office at [phone] if you have any questions, problems or concerns.

## 2019-04-20 ENCOUNTER — Encounter (HOSPITAL_COMMUNITY): Payer: Self-pay | Admitting: Family Medicine

## 2019-04-20 ENCOUNTER — Telehealth: Payer: Self-pay | Admitting: Internal Medicine

## 2019-04-20 ENCOUNTER — Observation Stay (HOSPITAL_COMMUNITY): Payer: Medicare Other

## 2019-04-20 ENCOUNTER — Emergency Department (HOSPITAL_COMMUNITY): Payer: Medicare Other

## 2019-04-20 ENCOUNTER — Other Ambulatory Visit: Payer: Medicare Other

## 2019-04-20 DIAGNOSIS — L97919 Non-pressure chronic ulcer of unspecified part of right lower leg with unspecified severity: Secondary | ICD-10-CM

## 2019-04-20 DIAGNOSIS — L97929 Non-pressure chronic ulcer of unspecified part of left lower leg with unspecified severity: Secondary | ICD-10-CM

## 2019-04-20 DIAGNOSIS — R441 Visual hallucinations: Secondary | ICD-10-CM | POA: Diagnosis present

## 2019-04-20 DIAGNOSIS — I5033 Acute on chronic diastolic (congestive) heart failure: Secondary | ICD-10-CM

## 2019-04-20 DIAGNOSIS — N183 Chronic kidney disease, stage 3 unspecified: Secondary | ICD-10-CM | POA: Diagnosis present

## 2019-04-20 DIAGNOSIS — N179 Acute kidney failure, unspecified: Secondary | ICD-10-CM

## 2019-04-20 DIAGNOSIS — I10 Essential (primary) hypertension: Secondary | ICD-10-CM

## 2019-04-20 DIAGNOSIS — R7989 Other specified abnormal findings of blood chemistry: Secondary | ICD-10-CM | POA: Diagnosis present

## 2019-04-20 DIAGNOSIS — I130 Hypertensive heart and chronic kidney disease with heart failure and stage 1 through stage 4 chronic kidney disease, or unspecified chronic kidney disease: Secondary | ICD-10-CM | POA: Diagnosis not present

## 2019-04-20 DIAGNOSIS — I482 Chronic atrial fibrillation, unspecified: Secondary | ICD-10-CM

## 2019-04-20 DIAGNOSIS — R778 Other specified abnormalities of plasma proteins: Secondary | ICD-10-CM | POA: Diagnosis present

## 2019-04-20 DIAGNOSIS — I872 Venous insufficiency (chronic) (peripheral): Secondary | ICD-10-CM

## 2019-04-20 DIAGNOSIS — K754 Autoimmune hepatitis: Secondary | ICD-10-CM | POA: Diagnosis not present

## 2019-04-20 LAB — TSH: TSH: 2.415 u[IU]/mL (ref 0.350–4.500)

## 2019-04-20 LAB — BASIC METABOLIC PANEL
Anion gap: 12 (ref 5–15)
BUN: 45 mg/dL — ABNORMAL HIGH (ref 8–23)
CO2: 30 mmol/L (ref 22–32)
Calcium: 9.4 mg/dL (ref 8.9–10.3)
Chloride: 100 mmol/L (ref 98–111)
Creatinine, Ser: 1.68 mg/dL — ABNORMAL HIGH (ref 0.61–1.24)
GFR calc Af Amer: 41 mL/min — ABNORMAL LOW (ref >60)
GFR calc non Af Amer: 35 mL/min — ABNORMAL LOW (ref >60)
Glucose, Bld: 140 mg/dL — ABNORMAL HIGH (ref 70–99)
Potassium: 3.6 mmol/L (ref 3.5–5.1)
Sodium: 142 mmol/L (ref 135–145)

## 2019-04-20 LAB — RPR: RPR Ser Ql: NONREACTIVE

## 2019-04-20 LAB — URINALYSIS, COMPLETE (UACMP) WITH MICROSCOPIC
Bacteria, UA: NONE SEEN
Bilirubin Urine: NEGATIVE
Glucose, UA: NEGATIVE mg/dL
Hgb urine dipstick: NEGATIVE
Ketones, ur: NEGATIVE mg/dL
Leukocytes,Ua: NEGATIVE
Nitrite: NEGATIVE
Protein, ur: NEGATIVE mg/dL
Specific Gravity, Urine: 1.016 (ref 1.005–1.030)
pH: 5 (ref 5.0–8.0)

## 2019-04-20 LAB — CBC WITH DIFFERENTIAL/PLATELET
Abs Immature Granulocytes: 0.06 10*3/uL (ref 0.00–0.07)
Basophils Absolute: 0 10*3/uL (ref 0.0–0.1)
Basophils Relative: 0 %
Eosinophils Absolute: 0 10*3/uL (ref 0.0–0.5)
Eosinophils Relative: 1 %
HCT: 43.5 % (ref 39.0–52.0)
Hemoglobin: 13.6 g/dL (ref 13.0–17.0)
Immature Granulocytes: 1 %
Lymphocytes Relative: 32 %
Lymphs Abs: 2.7 10*3/uL (ref 0.7–4.0)
MCH: 32 pg (ref 26.0–34.0)
MCHC: 31.3 g/dL (ref 30.0–36.0)
MCV: 102.4 fL — ABNORMAL HIGH (ref 80.0–100.0)
Monocytes Absolute: 1 10*3/uL (ref 0.1–1.0)
Monocytes Relative: 12 %
Neutro Abs: 4.5 10*3/uL (ref 1.7–7.7)
Neutrophils Relative %: 54 %
Platelets: 174 10*3/uL (ref 150–400)
RBC: 4.25 MIL/uL (ref 4.22–5.81)
RDW: 16.2 % — ABNORMAL HIGH (ref 11.5–15.5)
WBC: 8.3 10*3/uL (ref 4.0–10.5)
nRBC: 0.2 % (ref 0.0–0.2)

## 2019-04-20 LAB — COMPREHENSIVE METABOLIC PANEL
ALT: 18 U/L (ref 0–44)
AST: 30 U/L (ref 15–41)
Albumin: 3.4 g/dL — ABNORMAL LOW (ref 3.5–5.0)
Alkaline Phosphatase: 82 U/L (ref 38–126)
Anion gap: 16 — ABNORMAL HIGH (ref 5–15)
BUN: 51 mg/dL — ABNORMAL HIGH (ref 8–23)
CO2: 29 mmol/L (ref 22–32)
Calcium: 9.5 mg/dL (ref 8.9–10.3)
Chloride: 98 mmol/L (ref 98–111)
Creatinine, Ser: 2.39 mg/dL — ABNORMAL HIGH (ref 0.61–1.24)
GFR calc Af Amer: 26 mL/min — ABNORMAL LOW (ref >60)
GFR calc non Af Amer: 23 mL/min — ABNORMAL LOW (ref >60)
Glucose, Bld: 104 mg/dL — ABNORMAL HIGH (ref 70–99)
Potassium: 4.2 mmol/L (ref 3.5–5.1)
Sodium: 143 mmol/L (ref 135–145)
Total Bilirubin: 1.1 mg/dL (ref 0.3–1.2)
Total Protein: 7.2 g/dL (ref 6.5–8.1)

## 2019-04-20 LAB — CREATININE, URINE, RANDOM: Creatinine, Urine: 90.89 mg/dL

## 2019-04-20 LAB — HIV ANTIBODY (ROUTINE TESTING W REFLEX): HIV Screen 4th Generation wRfx: NONREACTIVE

## 2019-04-20 LAB — VITAMIN B12: Vitamin B-12: 2432 pg/mL — ABNORMAL HIGH (ref 180–914)

## 2019-04-20 LAB — T4, FREE: Free T4: 1.24 ng/dL (ref 0.82–1.77)

## 2019-04-20 LAB — SARS CORONAVIRUS 2 BY RT PCR (HOSPITAL ORDER, PERFORMED IN ~~LOC~~ HOSPITAL LAB): SARS Coronavirus 2: NEGATIVE

## 2019-04-20 LAB — TROPONIN I: Troponin I: 0.1 ng/mL (ref <0.03)

## 2019-04-20 LAB — SODIUM, URINE, RANDOM: Sodium, Ur: 13 mmol/L

## 2019-04-20 LAB — BRAIN NATRIURETIC PEPTIDE: B Natriuretic Peptide: 636 pg/mL — ABNORMAL HIGH (ref 0.0–100.0)

## 2019-04-20 LAB — AMMONIA: Ammonia: 19 umol/L (ref 9–35)

## 2019-04-20 MED ORDER — PREDNISONE 20 MG PO TABS
10.0000 mg | ORAL_TABLET | Freq: Every day | ORAL | Status: DC
  Administered 2019-04-20: 10 mg via ORAL
  Filled 2019-04-20: qty 1

## 2019-04-20 MED ORDER — ONDANSETRON HCL 4 MG PO TABS: 4.0000 mg | ORAL_TABLET | Freq: Four times a day (QID) | ORAL | Status: DC | PRN

## 2019-04-20 MED ORDER — QUETIAPINE FUMARATE 25 MG PO TABS: 25.0000 mg | ORAL_TABLET | Freq: Two times a day (BID) | ORAL | 5 refills | Status: AC

## 2019-04-20 MED ORDER — POLYETHYLENE GLYCOL 3350 17 G PO PACK: 17.0000 g | PACK | Freq: Every day | ORAL | Status: DC | PRN

## 2019-04-20 MED ORDER — SODIUM CHLORIDE 0.9 % IV SOLN: 250.0000 mL | INTRAVENOUS | Status: DC | PRN

## 2019-04-20 MED ORDER — ACETAMINOPHEN 325 MG PO TABS: 650.0000 mg | ORAL_TABLET | Freq: Four times a day (QID) | ORAL | Status: DC | PRN

## 2019-04-20 MED ORDER — ALBUTEROL SULFATE (2.5 MG/3ML) 0.083% IN NEBU: 3.0000 mL | INHALATION_SOLUTION | Freq: Four times a day (QID) | RESPIRATORY_TRACT | Status: DC

## 2019-04-20 MED ORDER — SODIUM CHLORIDE 0.9% FLUSH: 3.0000 mL | Freq: Two times a day (BID) | INTRAVENOUS | Status: DC

## 2019-04-20 MED ORDER — ONDANSETRON HCL 4 MG/2ML IJ SOLN: 4.0000 mg | Freq: Four times a day (QID) | INTRAMUSCULAR | Status: DC | PRN

## 2019-04-20 MED ORDER — ALBUTEROL SULFATE (2.5 MG/3ML) 0.083% IN NEBU
3.0000 mL | INHALATION_SOLUTION | RESPIRATORY_TRACT | Status: DC | PRN
  Filled 2019-04-20: qty 3

## 2019-04-20 MED ORDER — METOPROLOL TARTRATE 25 MG PO TABS
12.5000 mg | ORAL_TABLET | Freq: Two times a day (BID) | ORAL | Status: DC
  Administered 2019-04-20: 12:00:00 12.5 mg via ORAL
  Filled 2019-04-20 (×2): qty 1

## 2019-04-20 MED ORDER — HYDROCODONE-ACETAMINOPHEN 5-325 MG PO TABS: 1.0000 | ORAL_TABLET | ORAL | Status: DC | PRN

## 2019-04-20 MED ORDER — FUROSEMIDE 20 MG PO TABS
80.0000 mg | ORAL_TABLET | Freq: Once | ORAL | Status: AC
  Administered 2019-04-20: 80 mg via ORAL
  Filled 2019-04-20: qty 4

## 2019-04-20 MED ORDER — FUROSEMIDE 10 MG/ML IJ SOLN: 40.0000 mg | Freq: Two times a day (BID) | INTRAMUSCULAR | Status: DC

## 2019-04-20 MED ORDER — SODIUM CHLORIDE 0.9% FLUSH: 3.0000 mL | INTRAVENOUS | Status: DC | PRN

## 2019-04-20 MED ORDER — APIXABAN 2.5 MG PO TABS
2.5000 mg | ORAL_TABLET | Freq: Two times a day (BID) | ORAL | Status: DC
  Administered 2019-04-20: 2.5 mg via ORAL
  Filled 2019-04-20 (×3): qty 1

## 2019-04-20 MED ORDER — ACETAMINOPHEN 650 MG RE SUPP: 650.0000 mg | Freq: Four times a day (QID) | RECTAL | Status: DC | PRN

## 2019-04-20 MED ORDER — ALBUTEROL SULFATE (2.5 MG/3ML) 0.083% IN NEBU: 3.0000 mL | INHALATION_SOLUTION | RESPIRATORY_TRACT | Status: DC | PRN

## 2019-04-20 NOTE — ED Notes (Signed)
RN reviewed with dtrs what meds given here

## 2019-04-20 NOTE — ED Notes (Signed)
RT has evaluated pt as well.   Possibly with daughter history pt has had silent aspiration, will arrange ST.

## 2019-04-20 NOTE — Telephone Encounter (Signed)
Pt's daughter, Santiago Glad, has been informed and expressed understanding. They will proceed with the seroquel 25mg  dose.

## 2019-04-20 NOTE — Telephone Encounter (Signed)
shirron to contact duaghter/family - we see he was seen with hallucinations in ED again, and has been having more confusion as well.    We try to avoid sedatives as this can sometimes make it worse in some persons  We should be ok to start seroquel 25 mg bid to help with the hallucinations and confusion and agitation if this is an issue.  I have sent the prescription.  A psychiatric referral can be done if needed

## 2019-04-20 NOTE — ED Notes (Signed)
Admitting Provider at bedside. 

## 2019-04-20 NOTE — ED Notes (Signed)
Pt daughter- Seth Bake updated prior to going home [phone]

## 2019-04-20 NOTE — ED Notes (Signed)
Patient transported to CT 

## 2019-04-20 NOTE — ED Notes (Signed)
ED TO INPATIENT HANDOFF REPORT  ED Nurse Name and Phone #:  [phone]  S Name/Age/Gender Arthur [name] 85 y.o. male Room/Bed: 023C/023C  Code Status   Code Status: Full Code  Home/SNF/Other Home Patient oriented to: self, place and time Is this baseline? Yes   Triage Complete: Triage complete  Chief Complaint  Fall on thinners, hallucinations  Triage Note Daughter stated. For the last week he has had some confusion and some hallucinations. A couple months he fell and did not see anyone medically. He takes Blood thinners.  6 weeks ago he lost his wife.   Allergies Allergies  Allergen Reactions  . Penicillins Hives    Has patient had a PCN reaction causing immediate rash, facial/tongue/throat swelling, SOB or lightheadedness with hypotension:Yes Has patient had a PCN reaction causing severe rash involving mucus membranes or skin necrosis: No Has patient had a PCN reaction that required hospitalization: No Has patient had a PCN reaction occurring within the last 10 years: No If all of the above answers are "NO", then may proceed with Cephalosporin use.     Level of Care/Admitting Diagnosis ED Disposition    ED Disposition Condition [facility] Area: [place] [[id number]]  Level of Care: Telemetry Cardiac [103]  I expect the patient will be discharged within 24 hours: Yes  LOW acuity---Tx typically complete <24 hrs---ACUTE conditions typically can be evaluated <24 hours---LABS likely to return to acceptable levels <24 hours---IS near functional baseline---EXPECTED to return to current living arrangement---NOT newly hypoxic: Does not meet criteria for 5C-Observation unit  Covid Evaluation: Screening Protocol (No Symptoms)  Diagnosis: Acute renal failure superimposed on stage 3 chronic kidney disease [facility]) [[id number]]  Admitting Physician: Vianne Bulls [[id number]]  Attending Physician: Vianne Bulls [[id number]]  PT Class (Do Not Modify):  Observation [104]  PT Acc Code (Do Not Modify): Observation [10022]       B Medical/Surgery History Past Medical History:  Diagnosis Date  . Arrhythmia    1995  . Atrial fibrillation ([place])   . CHF (congestive heart failure) ([place]) 10/13/2016  . Coronary artery disease    MI, atherectomy 1993  . DDD (degenerative disc disease)   . Eczema   . Elevated LFTs   . History of gallstones   . History of transient ischemic attack (TIA) 2005   double vision  . Hyperlipidemia   . Hypertension   . Pneumonia    long time ago  . Skin cancer    melanoma in remission  . Stroke [facility])    Past Surgical History:  Procedure Laterality Date  . AMPUTATION Right 05/10/2013   Procedure: RIGHT SECOND TOE AMPUTATION;  Surgeon: Hessie Dibble, MD;  Location: [place];  Service: Orthopedics;  Laterality: Right;  . APPENDECTOMY    . CARDIAC CATHETERIZATION  04/08/99   LAD 70% INTER 40-50% RCA 30-40%  . CHEST TUBE INSERTION Right 02/16/2017   Procedure: INSERTION PLEURAL DRAINAGE CATHETER;  Surgeon: Grace Isaac, MD;  Location: [place];  Service: Thoracic;  Laterality: Right;  . CORONARY ANGIOPLASTY  04/08/99   LAD  . CORONARY ANGIOPLASTY  10/17/97   RCA   . CORONARY ANGIOPLASTY WITH STENT PLACEMENT  04/08/99   LAD  . IR THORACENTESIS ASP PLEURAL SPACE W/IMG GUIDE  11/22/2018  . MELANOMA EXCISION  2013   lt arm  . MELANOMA EXCISION Left 08/23/2013   Procedure: MELANOMA EXCISION;  Surgeon: Pedro Earls, MD;  Location: [facility];  Service: General;  Laterality: Left;  . REMOVAL OF PLEURAL DRAINAGE CATHETER Right 05/07/2017   Procedure: REMOVAL OF PLEURAL DRAINAGE CATHETER;  Surgeon: Grace Isaac, MD;  Location: [place];  Service: Thoracic;  Laterality: Right;  . TONSILLECTOMY    . VIDEO BRONCHOSCOPY Bilateral 05/07/2015   Procedure: VIDEO BRONCHOSCOPY WITHOUT FLUORO;  Surgeon: Rigoberto Noel, MD;  Location: [place];  Service: Cardiopulmonary;  Laterality: Bilateral;      A IV Location/Drains/Wounds Patient Lines/Drains/Airways Status   Active Line/Drains/Airways    Name:   Placement date:   Placement time:   Site:   Days:   Peripheral IV 04/20/19 Right Forearm   04/20/19    0054    Forearm   less than 1   Chest Tube 1 Right Pleural 15.5 Fr.   02/16/17    1236    Pleural   793   External Urinary Catheter   11/21/18    2000    -   150   Incision (Closed) 02/01/16 Leg Right   02/01/16    0031     1174   Incision (Closed) 02/16/17 Chest   02/16/17    1231     793   Wound / Incision (Open or Dehisced) 10/28/15  Arm Left skintear elbow   10/28/15    1700    Arm   1270   Wound / Incision (Open or Dehisced) 10/28/15  Elbow   10/28/15    1700    Elbow   1270   Wound / Incision (Open or Dehisced) 11/12/18 Non-pressure wound Toe (Comment  which one) Left   11/12/18    2203    Toe (Comment  which one)   159   Wound / Incision (Open or Dehisced) 11/21/18 Non-pressure wound Elbow Right Skin Tear/Abrasion    11/21/18    2200    Elbow   150   Wound / Incision (Open or Dehisced) 11/21/18 Other (Comment) Coccyx Posterior;Mid Stage 1   11/21/18    2202    Coccyx   150   Wound / Incision (Open or Dehisced) 11/21/18 Other (Comment) Wrist Right;Distal   11/21/18    2206    Wrist   150          Intake/Output Last 24 hours No intake or output data in the 24 hours ending 04/20/19 0650  Labs/Imaging Results for orders placed or performed during the hospital encounter of 04/19/19 (from the past 48 hour(s))  CBC with Differential     Status: Abnormal   Collection Time: 04/20/19 12:55 AM  Result Value Ref Range   WBC 8.3 4.0 - 10.5 K/uL   RBC 4.25 4.22 - 5.81 MIL/uL   Hemoglobin 13.6 13.0 - 17.0 g/dL   HCT 43.5 39.0 - 52.0 %   MCV 102.4 (H) 80.0 - 100.0 fL   MCH 32.0 26.0 - 34.0 pg   MCHC 31.3 30.0 - 36.0 g/dL   RDW 16.2 (H) 11.5 - 15.5 %   Platelets 174 150 - 400 K/uL   nRBC 0.2 0.0 - 0.2 %   Neutrophils Relative % 54 %   Neutro Abs 4.5 1.7 - 7.7 K/uL   Lymphocytes  Relative 32 %   Lymphs Abs 2.7 0.7 - 4.0 K/uL   Monocytes Relative 12 %   Monocytes Absolute 1.0 0.1 - 1.0 K/uL   Eosinophils Relative 1 %   Eosinophils Absolute 0.0 0.0 - 0.5 K/uL   Basophils Relative 0 %   Basophils  Absolute 0.0 0.0 - 0.1 K/uL   Immature Granulocytes 1 %   Abs Immature Granulocytes 0.06 0.00 - 0.07 K/uL    Comment: Performed at [facility], [place] [address], [place], [place] [address]  Comprehensive metabolic panel     Status: Abnormal   Collection Time: 04/20/19 12:55 AM  Result Value Ref Range   Sodium 143 135 - 145 mmol/L   Potassium 4.2 3.5 - 5.1 mmol/L   Chloride 98 98 - 111 mmol/L   CO2 29 22 - 32 mmol/L   Glucose, Bld 104 (H) 70 - 99 mg/dL   BUN 51 (H) 8 - 23 mg/dL   Creatinine, Ser 2.39 (H) 0.61 - 1.24 mg/dL   Calcium 9.5 8.9 - 10.3 mg/dL   Total Protein 7.2 6.5 - 8.1 g/dL   Albumin 3.4 (L) 3.5 - 5.0 g/dL   AST 30 15 - 41 U/L   ALT 18 0 - 44 U/L   Alkaline Phosphatase 82 38 - 126 U/L   Total Bilirubin 1.1 0.3 - 1.2 mg/dL   GFR calc non Af Amer 23 (L) >60 mL/min   GFR calc Af Amer 26 (L) >60 mL/min   Anion gap 16 (H) 5 - 15    Comment: Performed at [facility], [place] [address] [place], [place] [address]  Brain natriuretic peptide     Status: Abnormal   Collection Time: 04/20/19 12:55 AM  Result Value Ref Range   B Natriuretic Peptide 636.0 (H) 0.0 - 100.0 pg/mL    Comment: Performed at [facility] [address] [place], [place] [address]  Troponin I - ONCE - STAT     Status: Abnormal   Collection Time: 04/20/19 12:55 AM  Result Value Ref Range   Troponin I 0.10 (HH) <0.03 ng/mL    Comment: CRITICAL RESULT CALLED TO, READ BACK BY AND VERIFIED WITH: PEREZ,P RN 04/20/2019 0238 JORDANS Performed at [facility], [place] [address], [place], [place] [address]   TSH     Status: None   Collection Time: 04/20/19 12:55 AM  Result Value Ref Range   TSH 2.415 0.350 - 4.500 uIU/mL    Comment: Performed by a 3rd Generation assay  with a functional sensitivity of <=0.01 uIU/mL. Performed at [facility], [place] [address], [place], [place] [address]   T4, free     Status: None   Collection Time: 04/20/19 12:55 AM  Result Value Ref Range   Free T4 1.24 0.82 - 1.77 ng/dL    Comment: (NOTE) Biotin ingestion may interfere with free T4 tests. If the results are inconsistent with the TSH level, previous test results, or the clinical presentation, then consider biotin interference. If needed, order repeat testing after stopping biotin. Performed at [facility], [place] [address], [place], [place] [address]   SARS Coronavirus 2 (CEPHEID - Performed in [place] hospital lab), Hosp Order     Status: None   Collection Time: 04/20/19  3:52 AM  Result Value Ref Range   SARS Coronavirus 2 NEGATIVE NEGATIVE    Comment: (NOTE) If result is NEGATIVE SARS-CoV-2 target nucleic acids are NOT DETECTED. The SARS-CoV-2 RNA is generally detectable in upper and lower  respiratory specimens during the acute phase of infection. The lowest  concentration of SARS-CoV-2 viral copies this assay can detect is 250  copies / mL. A negative result does not preclude SARS-CoV-2 infection  and should not be used as the sole  basis for treatment or other  patient management decisions.  A negative result may occur with  improper specimen collection / handling, submission of specimen other  than nasopharyngeal swab, presence of viral mutation(s) within the  areas targeted by this assay, and inadequate number of viral copies  (<250 copies / mL). A negative result must be combined with clinical  observations, patient history, and epidemiological information. If result is POSITIVE SARS-CoV-2 target nucleic acids are DETECTED. The SARS-CoV-2 RNA is generally detectable in upper and lower  respiratory specimens dur ing the acute phase of infection.  Positive  results are indicative of active infection with SARS-CoV-2.  Clinical  correlation  with patient history and other diagnostic information is  necessary to determine patient infection status.  Positive results do  not rule out bacterial infection or co-infection with other viruses. If result is PRESUMPTIVE POSTIVE SARS-CoV-2 nucleic acids MAY BE PRESENT.   A presumptive positive result was obtained on the submitted specimen  and confirmed on repeat testing.  While 2019 novel coronavirus  (SARS-CoV-2) nucleic acids may be present in the submitted sample  additional confirmatory testing may be necessary for epidemiological  and / or clinical management purposes  to differentiate between  SARS-CoV-2 and other Sarbecovirus currently known to infect humans.  If clinically indicated additional testing with an alternate test  methodology [id number]) is advised. The SARS-CoV-2 RNA is generally  detectable in upper and lower respiratory sp ecimens during the acute  phase of infection. The expected result is Negative. Fact Sheet for Patients:  StrictlyIdeas.no Fact Sheet for Healthcare Providers: BankingDealers.co.za This test is not yet approved or cleared by the [place] FDA and has been authorized for detection and/or diagnosis of SARS-CoV-2 by FDA under an Emergency Use Authorization (EUA).  This EUA will remain in effect (meaning this test can be used) for the duration of the COVID-19 declaration under Section 564(b)(1) of the Act, 21 U.S.C. section 360bbb-3(b)(1), unless the authorization is terminated or revoked sooner. Performed at [facility], [place] [address] [place], [place] [address]    *Note: Due to a large number of results and/or encounters for the requested time period, some results have not been displayed. A complete set of results can be found in Results Review.   Dg Chest 2 View  Result Date: 04/20/2019 CLINICAL DATA:  Shortness of breath EXAM: CHEST - 2 VIEW COMPARISON:  04/13/2019 FINDINGS: The heart  size is significantly enlarged. There are small bilateral pleural effusions. There is mild generalized volume overload. There is no acute osseous abnormality. There are chronic degenerative changes of both shoulders. There is no pneumothorax. No large area of consolidation. Bibasilar airspace opacities are again noted and are favored to represent atelectasis. IMPRESSION: 1. No acute cardiopulmonary process. 2. Persistent cardiomegaly with stable bilateral pleural effusions. Electronically Signed   By: Constance Holster M.D.   On: 04/20/2019 02:01   Ct Head Wo Contrast  Result Date: 04/20/2019 CLINICAL DATA:  85 year old male with hallucination. EXAM: CT HEAD WITHOUT CONTRAST TECHNIQUE: Contiguous axial images were obtained from the base of the skull through the vertex without intravenous contrast. COMPARISON:  Head CT dated 01/31/2016 FINDINGS: Brain: Moderate age-related atrophy and chronic microvascular ischemic changes. There is no acute intracranial hemorrhage. No mass effect or midline shift. No extra-axial fluid collection. Old right cerebellar infarct as well as a smaller left cerebral old infarct noted. There is a partially calcified left posterior parietal meningioma measuring approximately 20 x 20 x 9 mm similar  to prior CT. Vascular: No hyperdense vessel or unexpected calcification. Skull: Normal. Negative for fracture or focal lesion. Sinuses/Orbits: No acute finding. Other: Bilateral cataract surgeries. IMPRESSION: 1. No acute intracranial hemorrhage. 2. Age-related atrophy and chronic microvascular ischemic changes. Old cerebellar infarcts. 3. Stable left posterior parietal meningioma. Electronically Signed   By: Anner Crete M.D.   On: 04/20/2019 02:03    Pending Labs Unresulted Labs (From admission, onward)    Start     Ordered   04/20/19 0644  Troponin I - Add-On to previous collection  Add-on,   R     04/20/19 0643   04/20/19 9381  Basic metabolic panel  Daily,   R     04/20/19  0527   04/20/19 0527  Ammonia  Once,   R     04/20/19 0527   04/20/19 0527  Vitamin B12  Once,   R     04/20/19 0527   04/20/19 0527  Folate RBC  Once,   R     04/20/19 0527   04/20/19 0527  RPR  Once,   R     04/20/19 0527   04/20/19 0527  HIV Antibody (routine testing w rflx)  Once,   R     04/20/19 0527   04/20/19 0527  Urinalysis, Complete w Microscopic  Once,   R     04/20/19 0527   04/20/19 0527  Sodium, urine, random  Once,   R     04/20/19 0527   04/20/19 0527  Creatinine, urine, random  Once,   R     04/20/19 0527   04/20/19 0527  Urea nitrogen, urine  Once,   R     04/20/19 0527          Vitals/Pain Today's Vitals   04/20/19 0330 04/20/19 0430 04/20/19 0500 04/20/19 0548  BP: (!) 132/91 (!) 149/100 140/88   Pulse: 64 61 80   Resp: (!) 29 (!) 26 20   Temp:      TempSrc:      SpO2: 91% 97% 91%   PainSc:    0-No pain    Isolation Precautions No active isolations  Medications Medications  metoprolol tartrate (LOPRESSOR) tablet 12.5 mg (has no administration in time range)  predniSONE (DELTASONE) tablet 10 mg (has no administration in time range)  apixaban (ELIQUIS) tablet 2.5 mg (has no administration in time range)  Albuterol Sulfate AEPB 1 puff (has no administration in time range)  sodium chloride flush (NS) 0.9 % injection 3 mL (has no administration in time range)  sodium chloride flush (NS) 0.9 % injection 3 mL (has no administration in time range)  sodium chloride flush (NS) 0.9 % injection 3 mL (has no administration in time range)  0.9 %  sodium chloride infusion (has no administration in time range)  acetaminophen (TYLENOL) tablet 650 mg (has no administration in time range)    Or  acetaminophen (TYLENOL) suppository 650 mg (has no administration in time range)  HYDROcodone-acetaminophen (NORCO/VICODIN) 5-325 MG per tablet 1 tablet (has no administration in time range)  polyethylene glycol (MIRALAX / GLYCOLAX) packet 17 g (has no administration in  time range)  ondansetron (ZOFRAN) tablet 4 mg (has no administration in time range)    Or  ondansetron (ZOFRAN) injection 4 mg (has no administration in time range)    Mobility walks with device Moderate fall risk   Focused Assessments Cardiac Assessment Handoff:  Cardiac Rhythm: Atrial fibrillation Lab Results  Component Value Date  CKTOTAL 48 01/12/2013   TROPONINI 0.10 ([place]) 04/20/2019   No results found for: DDIMER Does the Patient currently have chest pain? No     R Recommendations: See Admitting Provider Note  Report given to:   Additional Notes:  Pt's wife passed away 6 weeks ago. A&O x 3, but gets easily agitated.

## 2019-04-20 NOTE — ED Notes (Addendum)
DTR here to pick up patient. RN reviewed meds given here and instructed to follow up with PCP and cards today. She states she already has. She states other sister will also be in town for few days to get him settled and Alvis Lemmings will be providing 24 hour care. RN showed her wound on forearm. She is appreciative. States brief was soaked when she brought clothes. He does appear less SOB at this time. PT and dtr aware he is leaving against medical advice.

## 2019-04-20 NOTE — ED Notes (Addendum)
Pt alert and oriented x3. Pt states he has hallucinations. States that he has had none this morning. However patient states "my wife is upstairs taking a nap and I am just finishing mine now."

## 2019-04-20 NOTE — ED Notes (Signed)
RN called dtr to discuss plan of care. RN discussed that pt is not deemed incompetent and cannot be held against his will.

## 2019-04-20 NOTE — ED Notes (Signed)
Breakfast ordered 

## 2019-04-20 NOTE — ED Notes (Signed)
Pt reports falling backwards ~ 7 weeks ago. He hit the back of his head and his head went through the wall. He was not evaluated for the fall.  Recently, the pt began having visual hallucinations. He is seeing people as well as shadows.

## 2019-04-20 NOTE — Progress Notes (Signed)
Patient admitted after midnight for aki superimposed on CKD and acute on chronic diastolic heart failure as well as confusion and hallucinations. Plan of care discussed with patient and daughter. At baseline this am. Patient then decided he wanted to leave. Nursing staff discussed with daughter who agreed to take patient home ama. IV lasix ordered but patient pulled IV out. Changed to po. PCP noted serequel called in to pharmacy. 24/7 care being arranged.   Recommend OP follow up on silent aspiration and volume status as well as risk/benefit of eliquis. Consider palliative care referral for goals of care.     Patient left ama   Dyanne Carrel, NP

## 2019-04-20 NOTE — Evaluation (Signed)
Clinical/Bedside Swallow Evaluation Patient Details  Name: Arthur [name] MRN: [id number] Date of Birth: 1925/10/28  Today's Date: 04/20/2019 Time: SLP Start Time (ACUTE ONLY): 1211 SLP Stop Time (ACUTE ONLY): 1235 SLP Time Calculation (min) (ACUTE ONLY): 24 min  Past Medical History:  Past Medical History:  Diagnosis Date  . Arrhythmia    1995  . Atrial fibrillation ([place])   . CHF (congestive heart failure) ([place]) 10/13/2016  . Coronary artery disease    MI, atherectomy 1993  . DDD (degenerative disc disease)   . Eczema   . Elevated LFTs   . History of gallstones   . History of transient ischemic attack (TIA) 2005   double vision  . Hyperlipidemia   . Hypertension   . Pneumonia    long time ago  . Skin cancer    melanoma in remission  . Stroke [facility])    Past Surgical History:  Past Surgical History:  Procedure Laterality Date  . AMPUTATION Right 05/10/2013   Procedure: RIGHT SECOND TOE AMPUTATION;  Surgeon: Hessie Dibble, MD;  Location: [place];  Service: Orthopedics;  Laterality: Right;  . APPENDECTOMY    . CARDIAC CATHETERIZATION  04/08/99   LAD 70% INTER 40-50% RCA 30-40%  . CHEST TUBE INSERTION Right 02/16/2017   Procedure: INSERTION PLEURAL DRAINAGE CATHETER;  Surgeon: Grace Isaac, MD;  Location: [place];  Service: Thoracic;  Laterality: Right;  . CORONARY ANGIOPLASTY  04/08/99   LAD  . CORONARY ANGIOPLASTY  10/17/97   RCA   . CORONARY ANGIOPLASTY WITH STENT PLACEMENT  04/08/99   LAD  . IR THORACENTESIS ASP PLEURAL SPACE W/IMG GUIDE  11/22/2018  . MELANOMA EXCISION  2013   lt arm  . MELANOMA EXCISION Left 08/23/2013   Procedure: MELANOMA EXCISION;  Surgeon: Pedro Earls, MD;  [facility];  Service: General;  Laterality: Left;  . REMOVAL OF PLEURAL DRAINAGE CATHETER Right 05/07/2017   Procedure: REMOVAL OF PLEURAL DRAINAGE CATHETER;  Surgeon: Grace Isaac, MD;  Location: [place];  Service: Thoracic;  Laterality: Right;  .  TONSILLECTOMY    . VIDEO BRONCHOSCOPY Bilateral 05/07/2015   Procedure: VIDEO BRONCHOSCOPY WITHOUT FLUORO;  Surgeon: Rigoberto Noel, MD;  Location: [place];  Service: Cardiopulmonary;  Laterality: Bilateral;   HPI:  Arthur [name] is a 85 y.o. male with medical history significant for coronary artery disease, atrial fibrillation on Eliquis, autoimmune hepatitis on prednisone chronically, chronic kidney disease stage III, COPD, and chronic diastolic CHF, now presenting to the emergency department for evaluation of confusion, hallucinations, and swelling.  Per chart wife died approximately 1 month ago and has been suffering with visual hallucinations, intermittent confusion, and insomnia since shortly after that. Chest x-ray is negative for acute cardiopulmonary disease.  Head CT is negative for acute intracranial hemorrhage. Troponin is elevated to 0.10. MBS 3/17 aspiration with large sip thin via straw, immediate cough. Regulat/thin rec'd.   Assessment / Plan / Recommendation Clinical Impression  Pt is being discharged from ED and daughter on the way to pick him up. Signs of aspiration demonstrated when consumed 5-6 small pills, head extended with water (immediate cough). One delayed coughed with straw which may have residual from previous suspected airway intrusion; wet vocal quality. Subsequent cup sips thin appeared safer. Pt declined puree or solid texture. He has adequate dentition and denies problems masticating. Pt reported taking 3 large vitamins at once with water but "doesn't take the others with them." He inquired as to what  my impression was re: his swallowing. Discussed he may be have intermittent episodes of penetration/aspiration and recommendations are as follows: 1) safest most efficient for pills one at a time with puree if large or several smaller, 2) cups preferred over straws, 3) try to hold head in neutral vs extending neck, 4) regular, thin liquids. The more mobile he is the  better his lungs can handle trace transient aspiration.    SLP Visit Diagnosis: Dysphagia, unspecified (R13.10)    Aspiration Risk  Moderate aspiration risk    Diet Recommendation Thin liquid;Regular   Liquid Administration via: Cup;No straw Medication Administration: Whole meds with puree Supervision: Patient able to self feed Compensations: Slow rate;Small sips/bites Postural Changes: Seated upright at 90 degrees    Other  Recommendations Oral Care Recommendations: Oral care BID   Follow up Recommendations None      Frequency and Duration            Prognosis        Swallow Study   General HPI: Arthur [name] is a 85 y.o. male with medical history significant for coronary artery disease, atrial fibrillation on Eliquis, autoimmune hepatitis on prednisone chronically, chronic kidney disease stage III, COPD, and chronic diastolic CHF, now presenting to the emergency department for evaluation of confusion, hallucinations, and swelling.  Per chart wife died approximately 1 month ago and has been suffering with visual hallucinations, intermittent confusion, and insomnia since shortly after that. Chest x-ray is negative for acute cardiopulmonary disease.  Head CT is negative for acute intracranial hemorrhage. Troponin is elevated to 0.10. MBS 3/17 aspiration with large sip thin via straw, immediate cough. Regulat/thin rec'd. Type of Study: Bedside Swallow Evaluation Previous Swallow Assessment: (see HPI) Diet Prior to this Study: Regular;Thin liquids;Other (Comment)(NPO in ED) Temperature Spikes Noted: No Respiratory Status: Room air History of Recent Intubation: No Behavior/Cognition: Alert;Cooperative Oral Cavity Assessment: Within Functional Limits Oral Care Completed by SLP: No Oral Cavity - Dentition: Adequate natural dentition Vision: Functional for self-feeding Self-Feeding Abilities: Able to feed self Patient Positioning: Upright in chair Baseline Vocal Quality:  Normal Volitional Cough: Strong Volitional Swallow: Able to elicit    Oral/Motor/Sensory Function Overall Oral Motor/Sensory Function: Within functional limits   Ice Chips Ice chips: Not tested   Thin Liquid Thin Liquid: Impaired Presentation: Straw;Cup Pharyngeal  Phase Impairments: Cough - Immediate    Nectar Thick Nectar Thick Liquid: Not tested   Honey Thick Honey Thick Liquid: Not tested   Puree Puree: (Pt declined)   Solid     Solid: (pt declined)      Houston Siren 04/20/2019,1:51 PM   Orbie Pyo [place].Ed Risk analyst [phone] Office [phone]

## 2019-04-20 NOTE — Discharge Instructions (Signed)

## 2019-04-20 NOTE — ED Provider Notes (Signed)
[facility] EMERGENCY DEPARTMENT Provider Note  CSN: [id number] Arrival date & time: 04/19/19 1632  Chief Complaint(s) Altered Mental Status  HPI Arthur [name] is a 85 y.o. male with extensive past medical history listed below including atrial fibrillation on Eliquis, diastolic heart failure on Lasix, prior stroke who presents to the emergency department with 2 to 3 weeks of visual hallucinations.  Patient reports that he intermittently sees a person around.  At times there are blurry or shadow and at times they are very clear.  He does not recognize this person.  Whenever he calls out to the person or tries to go near the hallucination it goes away.  He also notices that the hallucinations come on when he is hungry or thirsty and they go away after eating.  They report that the hallucinations have been gradually worsening over the past 1 to 2 weeks.  They are unsure of inciting factors but reports that approximately 7 weeks ago the patient had a mechanical fall resulting in head trauma and was never evaluated.  In addition patient reports that he lost his wife to a heart attack 3 to 4 weeks ago and has been dealing with some adjustment issues and depression.  In addition the patient is currently on prednisone for psoriasis, which he has been on for several years.  They deny any recent fevers or infections.  No headache.  No focal deficits.  No visual disturbances, aphasia, dysarthria or trouble swallowing.  Patient and family do report that his peripheral edema from heart failure has been gradually improving over the past week since his lasix was increased by cardiology last week.  He is compliant with his medication.  HPI  Past Medical History Past Medical History:  Diagnosis Date  . Arrhythmia    1995  . Atrial fibrillation ([place])   . CHF (congestive heart failure) ([place]) 10/13/2016  . Coronary artery disease    MI, atherectomy 1993  . DDD (degenerative disc disease)   .  Eczema   . Elevated LFTs   . History of gallstones   . History of transient ischemic attack (TIA) 2005   double vision  . Hyperlipidemia   . Hypertension   . Pneumonia    long time ago  . Skin cancer    melanoma in remission  . Stroke [facility])    Patient Active Problem List   Diagnosis Date Noted  . Grief 04/17/2019  . Hypoxia 04/12/2019  . Confusion 04/12/2019  . Leg swelling 04/12/2019  . Rotator cuff arthropathy of left shoulder 01/17/2019  . Insomnia 11/30/2018  . Constipation 11/30/2018  . Aortic stenosis 11/12/2018  . AKI (acute kidney injury) ([place]) 11/12/2018  . Chronic diastolic heart failure ([place]) 11/12/2018  . Abnormal breath sounds 08/02/2018  . Pleural effusion, bilateral 04/14/2017  . Open leg wound, unspecified laterality, subsequent encounter 02/08/2017  . CHF (congestive heart failure) ([place]) 10/13/2016  . Encounter for well adult exam with abnormal findings 08/21/2016  . Hyperglycemia 08/21/2016  . Amaurosis fugax 07/08/2016  . Cerebrovascular accident (CVA) due to embolism of left middle cerebral artery ([place]) 03/21/2016  . Chronic anticoagulation 03/21/2016  . Ascending aortic aneurysm ([place]) 03/21/2016  . Stroke (cerebrum) ([place]) 02/01/2016  . Stroke due to embolism of left middle cerebral artery ([place]) 01/31/2016  . Varicose veins of right lower extremity with complications 38/25/0539  . CKD (chronic kidney disease) stage 3, GFR 30-59 ml/min (H[facility]) 10/31/2015  . CAP (community acquired pneumonia) 10/28/2015  . Acute renal  insufficiency 10/28/2015  . Lower back pain 10/03/2015  . Dyspnea 10/03/2015  . Abnormal bruising 10/03/2015  . Varicose veins of leg with complications 28/36/6294  . DVT (deep venous thrombosis) ([place]) 06/07/2015  . Venous ulcers of both lower extremities ([place]) 05/22/2015  . AAA (abdominal aortic aneurysm) without rupture- 4.6 05/07/2015  . Chronic cutaneous venous stasis ulcer ([place]) 05/05/2015  . Autoimmune hepatitis treated with  steroids ([place]) 05/05/2015  . 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 Melanoma of skin ([place]) 07/27/2013  . Osteomyelitis of toe of right foot 04/20/2013  . Current use of long term anticoagulation 03/04/2013  . PSA, INCREASED 10/18/2009  . Coronary atherosclerosis 10/16/2008  . ECZEMA 10/16/2008  . HLD (hyperlipidemia) 09/02/2007  . Essential hypertension 09/02/2007  . MYOCARDIAL INFARCTION, HX OF 09/02/2007  . Atrial fibrillation ([place]) 09/02/2007   Home Medication(s) Prior to Admission medications   Medication Sig Start Date End Date Taking? Authorizing Provider  acetaminophen (TYLENOL) 500 MG tablet Take 500 mg by mouth daily.    [provider]  Albuterol Sulfate (PROAIR RESPICLICK) 765 (90 Base) MCG/ACT AEPB Inhale 1 puff into the lungs every 4 (four) hours as needed (SOB). 11/25/18   Ghimire, Henreitta Leber, MD  apixaban (ELIQUIS) 2.5 MG TABS tablet Take 1 tablet (2.5 mg total) by mouth 2 (two) times daily. 11/14/18   Shelly Coss, MD  Ascorbic Acid (VITAMIN C PO) Take 1 tablet by mouth daily.    [provider]  Cholecalciferol (VITAMIN D3) 5000 units CAPS Take 5,000 Units by mouth daily.    [provider]  doxycycline (VIBRAMYCIN) 100 MG capsule Take 1 capsule (100 mg total) by mouth 2 (two) times daily. 04/13/19   [name], NP  furosemide (LASIX) 20 MG tablet Take 1 tablet (20 mg total) by mouth every evening. 12/30/18 12/25/19  Sherren Mocha, MD  furosemide (LASIX) 40 MG tablet Take 1 tablet (40 mg total) by mouth every morning. 12/30/18 12/25/19  Sherren Mocha, MD  metoprolol tartrate (LOPRESSOR) 25 MG tablet Take 0.5 tablets (12.5 mg total) by mouth 2 (two) times daily. 04/19/19   [name], NP  predniSONE (DELTASONE) 5 MG tablet Take 2 tablets (10 mg total) by mouth daily with breakfast. NEEDS APPT 04/14/19   Irene Shipper, MD  VITAMIN A PO Take 1 capsule by mouth daily.    [provider]                                                                                                                                     Past Surgical History Past Surgical History:  Procedure Laterality Date  . AMPUTATION Right 05/10/2013   Procedure: RIGHT SECOND TOE AMPUTATION;  Surgeon: Hessie Dibble, MD;  Location: [place];  Service: Orthopedics;  Laterality: Right;  . APPENDECTOMY    . CARDIAC CATHETERIZATION  04/08/99   LAD 70% INTER 40-50%  RCA 30-40%  . CHEST TUBE INSERTION Right 02/16/2017   Procedure: INSERTION PLEURAL DRAINAGE CATHETER;  Surgeon: Grace Isaac, MD;  Location: [place];  Service: Thoracic;  Laterality: Right;  . CORONARY ANGIOPLASTY  04/08/99   LAD  . CORONARY ANGIOPLASTY  10/17/97   RCA   . CORONARY ANGIOPLASTY WITH STENT PLACEMENT  04/08/99   LAD  . IR THORACENTESIS ASP PLEURAL SPACE W/IMG GUIDE  11/22/2018  . MELANOMA EXCISION  2013   lt arm  . MELANOMA EXCISION Left 08/23/2013   Procedure: MELANOMA EXCISION;  Surgeon:  Earls, MD;  [facility];  Service: General;  Laterality: Left;  . REMOVAL OF PLEURAL DRAINAGE CATHETER Right 05/07/2017   Procedure: REMOVAL OF PLEURAL DRAINAGE CATHETER;  Surgeon: Grace Isaac, MD;  Location: [place];  Service: Thoracic;  Laterality: Right;  . TONSILLECTOMY    . VIDEO BRONCHOSCOPY Bilateral 05/07/2015   Procedure: VIDEO BRONCHOSCOPY WITHOUT FLUORO;  Surgeon: Rigoberto Noel, MD;  Location: [place];  Service: Cardiopulmonary;  Laterality: Bilateral;   Family History Family History  Problem Relation Age of Onset  . Heart attack Mother 2       MI  . Pneumonia Father     Social History Social History   Tobacco Use  . Smoking status: Former Smoker    Years: 7.00    Types: Cigars, Pipe    Last attempt to quit: 11/18/1967    Years since quitting: 51.4  . Smokeless tobacco: Never Used  . Tobacco comment: 1-2 cigars a day, pipe  Substance Use Topics  . Alcohol use: Yes    Alcohol/week: 1.0 standard drinks    Types: 1  Glasses of wine per week    Comment: 2-3 glasses of wine in the evening  . Drug use: No   Allergies Penicillins  Review of Systems Review of Systems  Respiratory: Positive for shortness of breath.   Cardiovascular: Positive for leg swelling.  Psychiatric/Behavioral: Positive for hallucinations. Negative for agitation, behavioral problems and confusion.   All other systems are reviewed and are negative for acute change except as noted in the HPI  Physical Exam Vital Signs  I have reviewed the triage vital signs BP (!) 164/98 (BP Location: Right Arm)   Pulse 70   Temp 97.8 F (36.6 C) (Oral)   Resp (!) 30   SpO2 96%   Physical Exam Vitals signs reviewed.  Constitutional:      General: He is not in acute distress.    Appearance: He is well-developed. He is not diaphoretic.  HENT:     Head: Normocephalic and atraumatic.     Nose: Nose normal.  Eyes:     General: No scleral icterus.       Right eye: No discharge.        Left eye: No discharge.     Conjunctiva/sclera: Conjunctivae normal.     Pupils: Pupils are equal, round, and reactive to light.  Neck:     Musculoskeletal: Normal range of motion and neck supple.  Cardiovascular:     Rate and Rhythm: Normal rate and regular rhythm.     Heart sounds: Murmur present. Systolic murmur present with a grade of 4/6. No friction rub. No gallop.   Pulmonary:     Effort: Pulmonary effort is normal. Tachypnea present. No respiratory distress.     Breath sounds: No stridor. Examination of the right-lower field reveals decreased breath sounds. Examination of the left-lower field reveals decreased breath sounds. Decreased breath  sounds present. No rales.     Comments: Patient becomes winded while speaking. Abdominal:     General: There is no distension.     Palpations: Abdomen is soft.     Tenderness: There is no abdominal tenderness.  Musculoskeletal:        General: No tenderness.     Right lower leg: 2+ Pitting Edema present.      Left lower leg: 2+ Pitting Edema present.  Skin:    General: Skin is warm and dry.     Findings: No erythema or rash.  Neurological:     Mental Status: He is alert and oriented to person, place, and time.     ED Results and Treatments Labs (all labs ordered are listed, but only abnormal results are displayed) Labs Reviewed  CBC WITH DIFFERENTIAL/PLATELET - Abnormal; Notable for the following components:      Result Value   MCV 102.4 (*)    RDW 16.2 (*)    All other components within normal limits  COMPREHENSIVE METABOLIC PANEL - Abnormal; Notable for the following components:   Glucose, Bld 104 (*)    BUN 51 (*)    Creatinine, Ser 2.39 (*)    Albumin 3.4 (*)    GFR calc non Af Amer 23 (*)    GFR calc Af Amer 26 (*)    Anion gap 16 (*)    All other components within normal limits  BRAIN NATRIURETIC PEPTIDE - Abnormal; Notable for the following components:   B Natriuretic Peptide 636.0 (*)    All other components within normal limits  TROPONIN I - Abnormal; Notable for the following components:   Troponin I 0.10 (*)    All other components within normal limits  SARS CORONAVIRUS 2 (HOSPITAL ORDER, [facility])  TSH  T4, FREE  AMMONIA  VITAMIN B12  FOLATE RBC  RPR  HIV ANTIBODY (ROUTINE TESTING W REFLEX)  URINALYSIS, COMPLETE (UACMP) WITH MICROSCOPIC  SODIUM, URINE, RANDOM  CREATININE, URINE, RANDOM  UREA NITROGEN, URINE  BASIC METABOLIC PANEL                                                                                                                         EKG  EKG Interpretation  Date/Time:  Wednesday April 20 2019 01:02:38 EDT Ventricular Rate:  62 PR Interval:    QRS Duration: 123 QT Interval:  454 QTC Calculation: 462 R Axis:   -65 Text Interpretation:  Atrial fibrillation Multiple ventricular premature complexes Left bundle branch block No significant change since last tracing Confirmed by Addison Lank [phone]) on 04/20/2019  2:29:50 AM      Radiology Dg Chest 2 View  Result Date: 04/20/2019 CLINICAL DATA:  Shortness of breath EXAM: CHEST - 2 VIEW COMPARISON:  04/13/2019 FINDINGS: The heart size is significantly enlarged. There are small bilateral pleural effusions. There is mild generalized volume overload. There is no acute osseous abnormality. There are chronic degenerative changes of both  shoulders. There is no pneumothorax. No large area of consolidation. Bibasilar airspace opacities are again noted and are favored to represent atelectasis. IMPRESSION: 1. No acute cardiopulmonary process. 2. Persistent cardiomegaly with stable bilateral pleural effusions. Electronically Signed   By: Constance Holster M.D.   On: 04/20/2019 02:01   Ct Head Wo Contrast  Result Date: 04/20/2019 CLINICAL DATA:  85 year old male with hallucination. EXAM: CT HEAD WITHOUT CONTRAST TECHNIQUE: Contiguous axial images were obtained from the base of the skull through the vertex without intravenous contrast. COMPARISON:  Head CT dated 01/31/2016 FINDINGS: Brain: Moderate age-related atrophy and chronic microvascular ischemic changes. There is no acute intracranial hemorrhage. No mass effect or midline shift. No extra-axial fluid collection. Old right cerebellar infarct as well as a smaller left cerebral old infarct noted. There is a partially calcified left posterior parietal meningioma measuring approximately 20 x 20 x 9 mm similar to prior CT. Vascular: No hyperdense vessel or unexpected calcification. Skull: Normal. Negative for fracture or focal lesion. Sinuses/Orbits: No acute finding. Other: Bilateral cataract surgeries. IMPRESSION: 1. No acute intracranial hemorrhage. 2. Age-related atrophy and chronic microvascular ischemic changes. Old cerebellar infarcts. 3. Stable left posterior parietal meningioma. Electronically Signed   By: Anner Crete M.D.   On: 04/20/2019 02:03   Pertinent labs & imaging results that were available during my care  of the patient were reviewed by me and considered in my medical decision making (see chart for details).  Medications Ordered in ED Medications  metoprolol tartrate (LOPRESSOR) tablet 12.5 mg (has no administration in time range)  predniSONE (DELTASONE) tablet 10 mg (has no administration in time range)  apixaban (ELIQUIS) tablet 2.5 mg (has no administration in time range)  Albuterol Sulfate AEPB 1 puff (has no administration in time range)  sodium chloride flush (NS) 0.9 % injection 3 mL (has no administration in time range)  sodium chloride flush (NS) 0.9 % injection 3 mL (has no administration in time range)  sodium chloride flush (NS) 0.9 % injection 3 mL (has no administration in time range)  0.9 %  sodium chloride infusion (has no administration in time range)  acetaminophen (TYLENOL) tablet 650 mg (has no administration in time range)    Or  acetaminophen (TYLENOL) suppository 650 mg (has no administration in time range)  HYDROcodone-acetaminophen (NORCO/VICODIN) 5-325 MG per tablet 1 tablet (has no administration in time range)  polyethylene glycol (MIRALAX / GLYCOLAX) packet 17 g (has no administration in time range)  ondansetron (ZOFRAN) tablet 4 mg (has no administration in time range)    Or  ondansetron (ZOFRAN) injection 4 mg (has no administration in time range)                                                                                                                                    Procedures Procedures  (including critical care time)  Medical Decision Making / ED Course I have reviewed the  nursing notes for this encounter and the patient's prior records (if available in EHR or on provided paperwork).    Patient presents with intermittent hallucinations.  Multiple etiologies including early onset dementia with psychosis, prednisone related, depression related.  Patient was sent here to rule out ICH from fall several weeks ago.  CT head without acute process. No  electrolyte derangements.  We did note AKI that worsened from last BMP 7 days ago.   BNP, Trop at baseline. CXR w/o PNA or edema. Stable cardiomegaly with pleural effusions.  Due to AKI in the setting of diuresing, will need to admit. Since we are admitting, we can get MRI brain to rule out stroke as cause of hallucinations.   Final Clinical Impression(s) / ED Diagnoses Final diagnoses:  SOB (shortness of breath)  Auditory hallucinations  AKI (acute kidney injury) ([place])      This chart was dictated using voice recognition software.  Despite best efforts to proofread,  errors can occur which can change the documentation meaning.   Fatima Blank, MD 04/20/19 [phone]

## 2019-04-20 NOTE — H&P (Signed)
History and Physical    Arthur [name] PJK:[id number] DOB: 1925/09/21 DOA: 04/19/2019  PCP: Biagio Borg, MD   Patient coming from: Home   Chief Complaint: Confusion, hallucinations, swelling   HPI: Arthur [name] is a 85 y.o. male with medical history significant for coronary artery disease, atrial fibrillation on Eliquis, autoimmune hepatitis on prednisone chronically, chronic kidney disease stage III, COPD, and chronic diastolic CHF, now presenting to the emergency department for evaluation of confusion, hallucinations, and swelling.  He is accompanied by his daughter who assists with the history.  Patient unfortunately lost his wife approximately 1 month ago and has been suffering with visual hallucinations, intermittent confusion, and insomnia since shortly after that.  He has also had marked worsening in his chronic lower extremity edema that has improved with recent increase in his Lasix.  Patient acknowledges that he has been confused at times and sees things that are not there, but denies any headache, fevers or chills, change in vision or hearing, focal numbness or weakness, chest pain, or shortness of breath.  ED Course: Upon arrival to the ED, patient is found to be afebrile, saturating adequately on room air, slightly tachypneic, and with other vitals normal.  EKG features atrial fibrillation with PVCs and LBBB.  Chest x-ray is negative for acute cardiopulmonary disease.  Head CT is negative for acute intracranial hemorrhage.  Chemistry panel is notable for creatinine of 2.39, up from 1.48 a week earlier.  CBC is notable for a mild macrocytosis without anemia.  Troponin is elevated to 0.10 and BNP elevated to 636.  Patient was screened for COVID-19 and this was negative.  He remains hemodynamically stable and will be observed in the hospital for ongoing evaluation and management.  Review of Systems:  All other systems reviewed and apart from HPI, are negative.  Past Medical History:   Diagnosis Date  . Arrhythmia    1995  . Atrial fibrillation ([place])   . CHF (congestive heart failure) ([place]) 10/13/2016  . Coronary artery disease    MI, atherectomy 1993  . DDD (degenerative disc disease)   . Eczema   . Elevated LFTs   . History of gallstones   . History of transient ischemic attack (TIA) 2005   double vision  . Hyperlipidemia   . Hypertension   . Pneumonia    long time ago  . Skin cancer    melanoma in remission  . Stroke [facility])     Past Surgical History:  Procedure Laterality Date  . AMPUTATION Right 05/10/2013   Procedure: RIGHT SECOND TOE AMPUTATION;  Surgeon: Hessie Dibble, MD;  Location: [facility];  Service: Orthopedics;  Laterality: Right;  . APPENDECTOMY    . CARDIAC CATHETERIZATION  04/08/99   LAD 70% INTER 40-50% RCA 30-40%  . CHEST TUBE INSERTION Right 02/16/2017   Procedure: INSERTION PLEURAL DRAINAGE CATHETER;  Surgeon: Grace Isaac, MD;  Location: [place];  Service: Thoracic;  Laterality: Right;  . CORONARY ANGIOPLASTY  04/08/99   LAD  . CORONARY ANGIOPLASTY  10/17/97   RCA   . CORONARY ANGIOPLASTY WITH STENT PLACEMENT  04/08/99   LAD  . IR THORACENTESIS ASP PLEURAL SPACE W/IMG GUIDE  11/22/2018  . MELANOMA EXCISION  2013   lt arm  . MELANOMA EXCISION Left 08/23/2013   Procedure: MELANOMA EXCISION;  Surgeon: Pedro Earls, MD;  Location: [facility];  Service: General;  Laterality: Left;  . REMOVAL OF PLEURAL DRAINAGE CATHETER Right 05/07/2017   Procedure:  REMOVAL OF PLEURAL DRAINAGE CATHETER;  Surgeon: Grace Isaac, MD;  Location: [place];  Service: Thoracic;  Laterality: Right;  . TONSILLECTOMY    . VIDEO BRONCHOSCOPY Bilateral 05/07/2015   Procedure: VIDEO BRONCHOSCOPY WITHOUT FLUORO;  Surgeon: Rigoberto Noel, MD;  Location: [facility];  Service: Cardiopulmonary;  Laterality: Bilateral;     reports that he quit smoking about 51 years ago. His smoking use included cigars and pipe. He quit after 7.00 years of use. He has  never used smokeless tobacco. He reports current alcohol use of about 1.0 standard drinks of alcohol per week. He reports that he does not use drugs.  Allergies  Allergen Reactions  . Penicillins Hives    Has patient had a PCN reaction causing immediate rash, facial/tongue/throat swelling, SOB or lightheadedness with hypotension:Yes Has patient had a PCN reaction causing severe rash involving mucus membranes or skin necrosis: No Has patient had a PCN reaction that required hospitalization: No Has patient had a PCN reaction occurring within the last 10 years: No If all of the above answers are "NO", then may proceed with Cephalosporin use.     Family History  Problem Relation Age of Onset  . Heart attack Mother 98       MI  . Pneumonia Father      Prior to Admission medications   Medication Sig Start Date End Date Taking? Authorizing Provider  acetaminophen (TYLENOL) 500 MG tablet Take 500 mg by mouth daily.   Yes [provider]  Albuterol Sulfate (PROAIR RESPICLICK) 527 (90 Base) MCG/ACT AEPB Inhale 1 puff into the lungs every 4 (four) hours as needed (SOB). 11/25/18  Yes Ghimire, Henreitta Leber, MD  apixaban (ELIQUIS) 2.5 MG TABS tablet Take 1 tablet (2.5 mg total) by mouth 2 (two) times daily. 11/14/18  Yes Shelly Coss, MD  Ascorbic Acid (VITAMIN C PO) Take 1 tablet by mouth daily.   Yes [provider]  Cholecalciferol (VITAMIN D3) 5000 units CAPS Take 5,000 Units by mouth daily.   Yes [provider]  doxycycline (VIBRAMYCIN) 100 MG capsule Take 1 capsule (100 mg total) by mouth 2 (two) times daily. 04/13/19  Yes [name], NP  furosemide (LASIX) 40 MG tablet Take 1 tablet (40 mg total) by mouth every morning. Patient taking differently: Take 40 mg by mouth daily.  12/30/18 12/25/19 Yes Sherren Mocha, MD  metoprolol tartrate (LOPRESSOR) 25 MG tablet Take 0.5 tablets (12.5 mg total) by mouth 2 (two) times daily. 04/19/19  Yes [name], NP   predniSONE (DELTASONE) 5 MG tablet Take 2 tablets (10 mg total) by mouth daily with breakfast. NEEDS APPT 04/14/19  Yes Irene Shipper, MD  VITAMIN A PO Take 1 capsule by mouth daily.   Yes [provider]  furosemide (LASIX) 20 MG tablet Take 1 tablet (20 mg total) by mouth every evening. Patient not taking: Reported on 04/20/2019 12/30/18 12/25/19  Sherren Mocha, MD    Physical Exam: Vitals:   04/20/19 0300 04/20/19 0330 04/20/19 0430 04/20/19 0500  BP: (!) 155/96 (!) 132/91 (!) 149/100 140/88  Pulse: (!) 57 64 61 (!) 43  Resp: (!) 27 (!) 29 (!) 26 (!) 34  Temp:      TempSrc:      SpO2: 100% (!) 80% 97% (!) 88%    Constitutional: NAD, calm  Eyes: PERTLA, lids and conjunctivae normal ENMT: Mucous membranes are moist. Posterior pharynx clear of any exudate or lesions.   Neck: normal, supple, no  masses, no thyromegaly Respiratory: Mild tachypnea, dyspnea with speech, no wheezing, no crackles. No accessory muscle use.  Cardiovascular: Rate ~80 and irregular. Pitting pretibial edema bilaterally. Abdomen: No distension, no tenderness, soft. Bowel sounds normal.  Musculoskeletal: no clubbing / cyanosis. No joint deformity upper and lower extremities.   Skin: Chronic-appearing ulcerations to bilaterally LE's. Warm, dry, well-perfused. Neurologic: CN 2-12 grossly intact. Sensation intact. Strength 5/5 in all 4 limbs.  Psychiatric: Alert and oriented to person, place, and situation, but intermittently disoriented and distracted. Calm, cooperative.     Labs on Admission: I have personally reviewed following labs and imaging studies  CBC: Recent Labs  Lab 04/13/19 1028 04/20/19 0055  WBC 9.4 8.3  NEUTROABS  --  4.5  HGB 12.6* 13.6  HCT 38.1 43.5  MCV 99* 102.4*  PLT 168 825   Basic Metabolic Panel: Recent Labs  Lab 04/13/19 1028 04/20/19 0055  NA 141 143  K 4.3 4.2  CL 96 98  CO2 28 29  GLUCOSE 124* 104*  BUN 34 51*  CREATININE 1.48* 2.39*  CALCIUM 9.3 9.5   GFR:  Estimated Creatinine Clearance: 20.7 mL/min (A) (by C-G formula based on SCr of 2.39 mg/dL (H)). Liver Function Tests: Recent Labs  Lab 04/20/19 0055  AST 30  ALT 18  ALKPHOS 82  BILITOT 1.1  PROT 7.2  ALBUMIN 3.4*   No results for input(s): LIPASE, AMYLASE in the last 168 hours. No results for input(s): AMMONIA in the last 168 hours. Coagulation Profile: No results for input(s): INR, PROTIME in the last 168 hours. Cardiac Enzymes: Recent Labs  Lab 04/20/19 0055  TROPONINI 0.10*   BNP (last 3 results) Recent Labs    08/09/18 1152 08/20/18 1148 04/13/19 1028  PROBNP 5,129* 4,156* 6,505*   HbA1C: No results for input(s): HGBA1C in the last 72 hours. CBG: No results for input(s): GLUCAP in the last 168 hours. Lipid Profile: No results for input(s): CHOL, HDL, LDLCALC, TRIG, CHOLHDL, LDLDIRECT in the last 72 hours. Thyroid Function Tests: Recent Labs    04/20/19 0055  TSH 2.415  FREET4 1.24   Anemia Panel: No results for input(s): VITAMINB12, FOLATE, FERRITIN, TIBC, IRON, RETICCTPCT in the last 72 hours. Urine analysis:    Component Value Date/Time   COLORURINE YELLOW 11/21/2018 1745   APPEARANCEUR Clear 04/13/2019 1029   LABSPEC 1.015 11/21/2018 1745   PHURINE 5.0 11/21/2018 1745   GLUCOSEU Negative 04/13/2019 1029   GLUCOSEU NEGATIVE 08/18/2016 1534   HGBUR NEGATIVE 11/21/2018 1745   HGBUR negative 10/29/2010 0807   BILIRUBINUR Negative 04/13/2019 [place] 11/21/2018 1745   PROTEINUR Trace 04/13/2019 1029   PROTEINUR NEGATIVE 11/21/2018 1745   UROBILINOGEN 0.2 08/18/2016 1534   NITRITE Negative 04/13/2019 1029   NITRITE NEGATIVE 11/21/2018 1745   LEUKOCYTESUR Negative 04/13/2019 1029   Sepsis Labs: @LABRCNTIP (procalcitonin:4,lacticidven:4) ) Recent Results (from the past 240 hour(s))  Urine Culture     Status: None   Collection Time: 04/13/19 10:28 AM  Result Value Ref Range Status   Urine Culture, Routine Final report  Final    Organism ID, Bacteria Comment  Final    Comment: Mixed urogenital flora 10,000-25,000 colony forming units per mL      Radiological Exams on Admission: Dg Chest 2 View  Result Date: 04/20/2019 CLINICAL DATA:  Shortness of breath EXAM: CHEST - 2 VIEW COMPARISON:  04/13/2019 FINDINGS: The heart size is significantly enlarged. There are small bilateral pleural effusions. There is mild generalized volume overload. There  is no acute osseous abnormality. There are chronic degenerative changes of both shoulders. There is no pneumothorax. No large area of consolidation. Bibasilar airspace opacities are again noted and are favored to represent atelectasis. IMPRESSION: 1. No acute cardiopulmonary process. 2. Persistent cardiomegaly with stable bilateral pleural effusions. Electronically Signed   By: Constance Holster M.D.   On: 04/20/2019 02:01   Ct Head Wo Contrast  Result Date: 04/20/2019 CLINICAL DATA:  85 year old male with hallucination. EXAM: CT HEAD WITHOUT CONTRAST TECHNIQUE: Contiguous axial images were obtained from the base of the skull through the vertex without intravenous contrast. COMPARISON:  Head CT dated 01/31/2016 FINDINGS: Brain: Moderate age-related atrophy and chronic microvascular ischemic changes. There is no acute intracranial hemorrhage. No mass effect or midline shift. No extra-axial fluid collection. Old right cerebellar infarct as well as a smaller left cerebral old infarct noted. There is a partially calcified left posterior parietal meningioma measuring approximately 20 x 20 x 9 mm similar to prior CT. Vascular: No hyperdense vessel or unexpected calcification. Skull: Normal. Negative for fracture or focal lesion. Sinuses/Orbits: No acute finding. Other: Bilateral cataract surgeries. IMPRESSION: 1. No acute intracranial hemorrhage. 2. Age-related atrophy and chronic microvascular ischemic changes. Old cerebellar infarcts. 3. Stable left posterior parietal meningioma. Electronically  Signed   By: Anner Crete M.D.   On: 04/20/2019 02:03    EKG: Independently reviewed. Atrial fibrillation, PVC's, LBBB.   Assessment/Plan   1. Acute kidney injury superimposed on CKD stage III  - SCr is 2.39 on admission, up from a baseline of 1.2-1.4  - Likely prerenal azotemia in light of poor appetite and recent increase in Lasix  - Check UA, calculate FEUrea, check renal US, renally-dose medications, avoid nephrotoxins, repeat chem panel in am    2. Confusion, hallucinations  - Patient has had visual hallucinations, insomnia, and intermittent confusion for several wks now and family is working to establish 24/7 care for him  - There is no focal neurologic deficit identified and no acute intracranial findings on head CT; TSH and free T4 are normal in ED  - He has hx of autoimmune hepatitis and possible cirrhosis, so will check ammonia; he is a former alcoholic with macrocytosis and B12 and folate levels will be assessed; will also check RPR  - Consult with PT, consider MRI brain if his condition persists and pending labs are not revealing    3. Atrial fibrillation  - In rate-controlled a fib on admission  - CHADS-VASc is at least 51 (age x2, CVA x2, CHF) - Continue Eliquis and metoprolol    4. Chronic diastolic CHF  - There is marked leg swelling that has reportedly been improving, he is dyspneic with speech but no rales noted  - SLIV, hold Lasix in light of acute on chronic renal failure, follow daily wt and I/O's, continue beta-blocker as tolerated    5. Chronic leg wounds  - Do not appear acutely infected, continue wound care    6. COPD  - He is dyspneic with speech in ED, but no wheezing or cough  - Continue as-needed albuterol    7. Autoimmune hepatitis  - Managed with prednisone for many years, will continue  - He is due for GI follow-up   8. Elevated troponin  - Troponin is elevated to 0.10 in ED without any chest pain; EKG similar to recent rhythm strip  - Repeat  troponin, continue cardiac monitoring for now, continue beta-blocker      PPE: Mask, face shield. Patient  wearing mask.  DVT prophylaxis: Eliquis  Code Status: Full Family Communication: Daughter updated at bedside Consults called: None Admission status: Observation     Vianne Bulls, MD [facility] Pager [phone]  If 7PM-7AM, please contact night-coverage [email or web address] Password [email or web address]  04/20/2019, 5:30 AM

## 2019-04-20 NOTE — ED Notes (Signed)
Pt and pt's daughter have complained intermittently about the monitor beeping while pt is trying to sleep. Advised that I cannot turn the monitor off. Pt states that he refuses to wear monitor, so equip removed.

## 2019-04-20 NOTE — ED Notes (Signed)
Patient transported to Ultrasound 

## 2019-04-20 NOTE — ED Notes (Signed)
Pt demanding that IV be removed- site does look like bleeding around it and has skin tear near it. RN used adhesive remover and removed IV and placed tegaderm. RN attempted to call daughter- pt wants to leave. Pt reports he is aware his wife died 3 weeks ago- he knows it is June and he was able to tell me how many years married and how many children and grandchildren he has.

## 2019-04-20 NOTE — ED Notes (Signed)
[facility] home health nurse called. They will be providing 24 hour care from now on. Pt's wife did all the cooking, cleaning, and caregiving for patient and he does not know how to do any of those things for himself. RN informed them about new skin tear on R forearm.

## 2019-04-20 NOTE — ED Notes (Addendum)
Pt has taken off leads and in chair in room. States he is leaving. RN called Dyanne Carrel and discussed. He can leave AMA. Does need lasix. RN will attempt to give before he goes home. RN called speech to see if they can do evaluation before he elopes

## 2019-04-21 LAB — FOLATE RBC
Folate, Hemolysate: >620 ng/mL
Folate, RBC: >1649 ng/mL (ref >498)
Hematocrit: 37.6 % (ref 37.5–51.0)

## 2019-04-21 LAB — UREA NITROGEN, URINE: Urea Nitrogen, Ur: 955 mg/dL

## 2019-04-22 ENCOUNTER — Telehealth: Payer: Self-pay | Admitting: *Deleted

## 2019-04-22 NOTE — Telephone Encounter (Signed)
Transition Care Management Follow-up Telephone Call   Date discharged? 04/20/19   How have you been since you were released from the hospital? Spoke w/daughter she states Arthur [name] is doing ok. He is still asleep this morning   Do you understand why you were in the hospital? YES   Do you understand the discharge instructions? YES   Where were you discharged to? Home   Items Reviewed:  Medications reviewed: YES, she also states Dr. Jenny [name] rx Seroquel which he have started on so far no complications w/med  Allergies reviewed: YES  Dietary changes reviewed: YES  Referrals reviewed: No referral recommeded   Functional Questionnaire:   Activities of Daily Living (ADLs):   She states he are independent in the following: bathing and hygiene, feeding, continence, grooming, toileting and dressing States he require assistance with the following: ambulation has a cane    Any transportation issues/concerns?: NO   Any patient concerns? NO   Confirmed importance and date/time of follow-up visits scheduled YES, (virtual) 04/27/19  Provider Appointment booked with Dr. Jenny [name]  Confirmed with patient if condition begins to worsen call PCP or go to the ER.  Patient was given the office number and encouraged to call back with question or concerns.  : NO

## 2019-04-25 ENCOUNTER — Other Ambulatory Visit: Payer: Self-pay

## 2019-04-25 ENCOUNTER — Telehealth: Payer: Self-pay | Admitting: Cardiovascular Disease

## 2019-04-25 ENCOUNTER — Other Ambulatory Visit: Payer: Self-pay | Admitting: Nurse Practitioner

## 2019-04-25 ENCOUNTER — Encounter: Payer: Self-pay | Admitting: Physician Assistant

## 2019-04-25 ENCOUNTER — Ambulatory Visit: Payer: Medicare Other | Admitting: Podiatry

## 2019-04-25 DIAGNOSIS — R0602 Shortness of breath: Secondary | ICD-10-CM

## 2019-04-25 NOTE — Progress Notes (Addendum)
   Truitt Merle, NP reached out to me as she is working remotely and I am physically in the office. She has been in extensive discussions with the patient and family members about Mr. Ritzel's decline. There are discussions being had about aggressive care versus palliative approach. He has had continued shortness of breath. Per her discussions with the family, she states the decision has been made to trial him on 2L of O2 continuously via nasal cannula for shortness of breath. He is at home and high risk for exposure in the community during this pandemic, so unable to test O2 levels, but has had prior documented hypoxia. She requested I help facililtate prescription to be faxed to [place] since she is not personally able to while out of the office. This will be faxed to [phone] along with a copy of this message. She requests all correspondence come back to her if needed regarding the O2.    Addendum: Fax number does not work despite multiple tries. I reviewed with Lenice Llamas RN to give her physical handoff of paper rx to try again tomorrow since I won't be physically back in the office until 6/23. She knows of a way to hopefully achieve this in Epic. She will help facilitate. Greatly appreciated!   Dayna Dunn PA-C

## 2019-04-25 NOTE — Progress Notes (Signed)
O2 order faxed to [phone]. Order placed in Epic and [place] notifed to pull order as well.

## 2019-04-25 NOTE — Telephone Encounter (Signed)
New Message    Shraddha from [facility] takes care of patient at his residence and states he's been had a breathing issue and it's getting worse.  The family wanted her to call to see if maybe oxygen can be ordered for patient as a PRN.  States it would have to be ordered from [employer] if the doctor decides to prescribe it for him.

## 2019-04-25 NOTE — Telephone Encounter (Signed)
04/25/19   I had a 40 minute conference call with all 5 siblings yesterday (Sunday June 7th) afternoon to discuss Gloria's health/current condition. He is failing in health - he is becoming more short of breath, more confused and more agitated. Still not really sleeping. Family has 24/7 help starting this week.   We have discussed Donaldson's prognosis - I feel it is poor. I do not think he will return to the functional capacity he had 6 months ago - his overall functional status has been quite poor over the past year.   Son Aaron Edelman is the executor/POA/HCPOA - he notes his father's wishes are to "do anything and everything to keep him alive and to spare no expense".  It was unclear to me as to what to do in the event Demarie was "incompetant" and could no longer make decisions. They are wanting to consider Hospice and asking what all that involves if their is more of a focus towards comfort care. Their father remains a full code as well.   I reiterated numerous times that all 5 of them need to be in agreement going forward with any and all decisions being made. I recommended for them to ask Benjamen - "do you want to go back to the hospital" and see what his response is. They also have the priest from [facility] coming to see him either today or tomorrow to help with an end of life discussion.   They were to be in touch with me once they all reached an agreement. I am happy to refer to Hospice if that is what they desire.   I have had Melina Copa, PA who is physically located in the office at [facility] today to send in RX for oxygen at 2liters/min - can use continuously if needed for shortness of breath with an "inability to test" to see if he qualified. Further correspondence should be sent my way. This order has been faxed to [phone]  I have alerted Santiago Glad, his daughter that this order has been placed.   [name], RN, [place] [address]  [place], [place]  [address] [phone]

## 2019-04-26 ENCOUNTER — Other Ambulatory Visit: Payer: Self-pay | Admitting: Nurse Practitioner

## 2019-04-26 ENCOUNTER — Telehealth: Payer: Self-pay | Admitting: Nurse Practitioner

## 2019-04-26 DIAGNOSIS — R0602 Shortness of breath: Secondary | ICD-10-CM

## 2019-04-26 NOTE — Telephone Encounter (Addendum)
Have received text from daughter Arthur [name] does not wish to go to the hospital - his healthcare directive has been legally changed to allow brother Arthur [name] to act as healthcare agent and make decisions now regarding life prolonging measures.   Arthur [name] is declining - he is more short of breath - we attempted to get oxygen delivered without testing (ok if they had to self pay) - but have not been successful. More confused and disoriented. Family asking for Hospice services.   Referral called to Hospice. I will serve as his attending. Oxygen can be arranged.  Hospice is trying to see him this afternoon. I do have a DNR form in my possession if needed.   [name], RN, [place] [address] [place], [place]  [phone]

## 2019-04-26 NOTE — Telephone Encounter (Signed)
  [name] is calling to let us know that they believe the patient is terminal and they would like an order to get morphine 20mg  per ml, 5mg  every four hours as needed. Family uses [facility]

## 2019-04-26 NOTE — Progress Notes (Signed)
See phone note  [name], RN, [place] [address] [place], [place]  [address] [phone]

## 2019-04-26 NOTE — Telephone Encounter (Signed)
Spoke with [name], I advised her to contact the APP on call. She states she is not sure if the pt will need morphine tonight, but she wanted to have it on hand just in case. She states if pt begins to decline, she will contact our on call APP for orders.

## 2019-04-26 NOTE — Telephone Encounter (Signed)
I have spoken with [name] with Hospice - [place] has been made a DNR - Dr. Lyman Speller has signed. Oxygen and nebulizer being delivered tonight. She will pass on to the nurse tomorrow about the Morphine - will ask Dr. Lyman Speller to fax order in since I am working remotely from home.   [name] notes that his hands are black, lips are cyanotic and he has cheyne-stokes respirations. He remains confused and hallucinating.   Will be available as needed.   [name]

## 2019-04-27 ENCOUNTER — Inpatient Hospital Stay: Payer: Medicare Other | Admitting: Internal Medicine

## 2019-04-28 ENCOUNTER — Telehealth: Payer: Self-pay | Admitting: Cardiovascular Disease

## 2019-05-02 ENCOUNTER — Telehealth: Payer: Self-pay | Admitting: Nurse Practitioner

## 2019-05-02 NOTE — Telephone Encounter (Signed)
Death certificate received 05/02/19 from Richrd Humbles, Truitt Merle, NP to sign.

## 2019-05-16 ENCOUNTER — Ambulatory Visit: Payer: Medicare Other | Admitting: Family Medicine

## 2019-05-18 NOTE — Telephone Encounter (Signed)
New Message     Sreejesh from [facility] is calling to let Dr Burt Knack know they are discharging the pt from the services  He is under Hospice care

## 2019-05-18 NOTE — Telephone Encounter (Signed)
Noted  

## 2019-05-18 DEATH — deceased

## 2019-05-24 ENCOUNTER — Ambulatory Visit: Payer: Medicare Other | Admitting: Adult Health

## 2019-06-02 ENCOUNTER — Ambulatory Visit: Payer: Medicare Other | Admitting: Internal Medicine

## 2019-06-29 ENCOUNTER — Ambulatory Visit: Payer: Medicare Other | Admitting: Nurse Practitioner

## 2019-10-24 IMAGING — CR CHEST - 2 VIEW
2 series · 2 of 2 positions shown · non-contrast
Comparison: 03/17/2019

CLINICAL DATA: Confusion with shortness of breath

EXAM:
CHEST - 2 VIEW

[w chest lat]
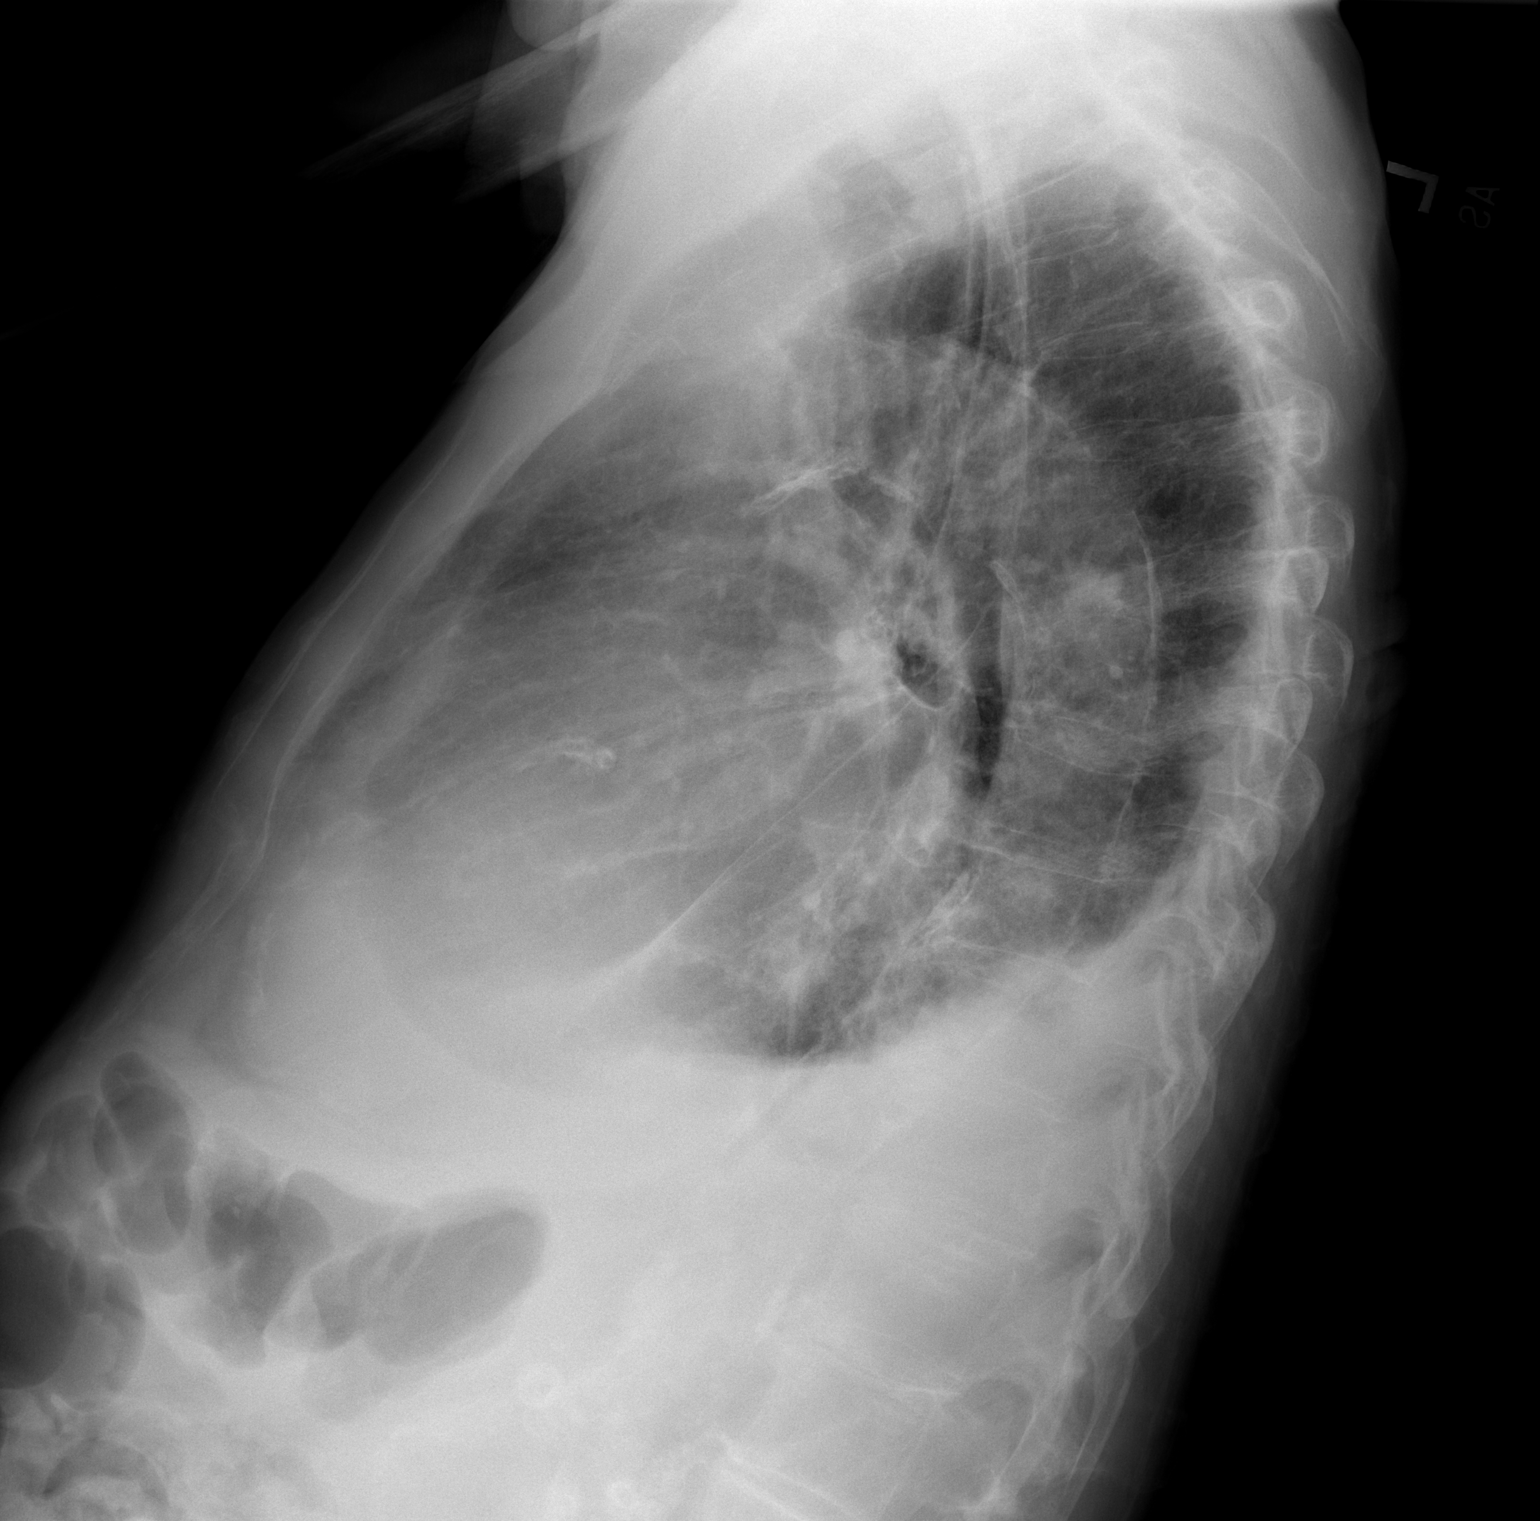

[w chest ap]
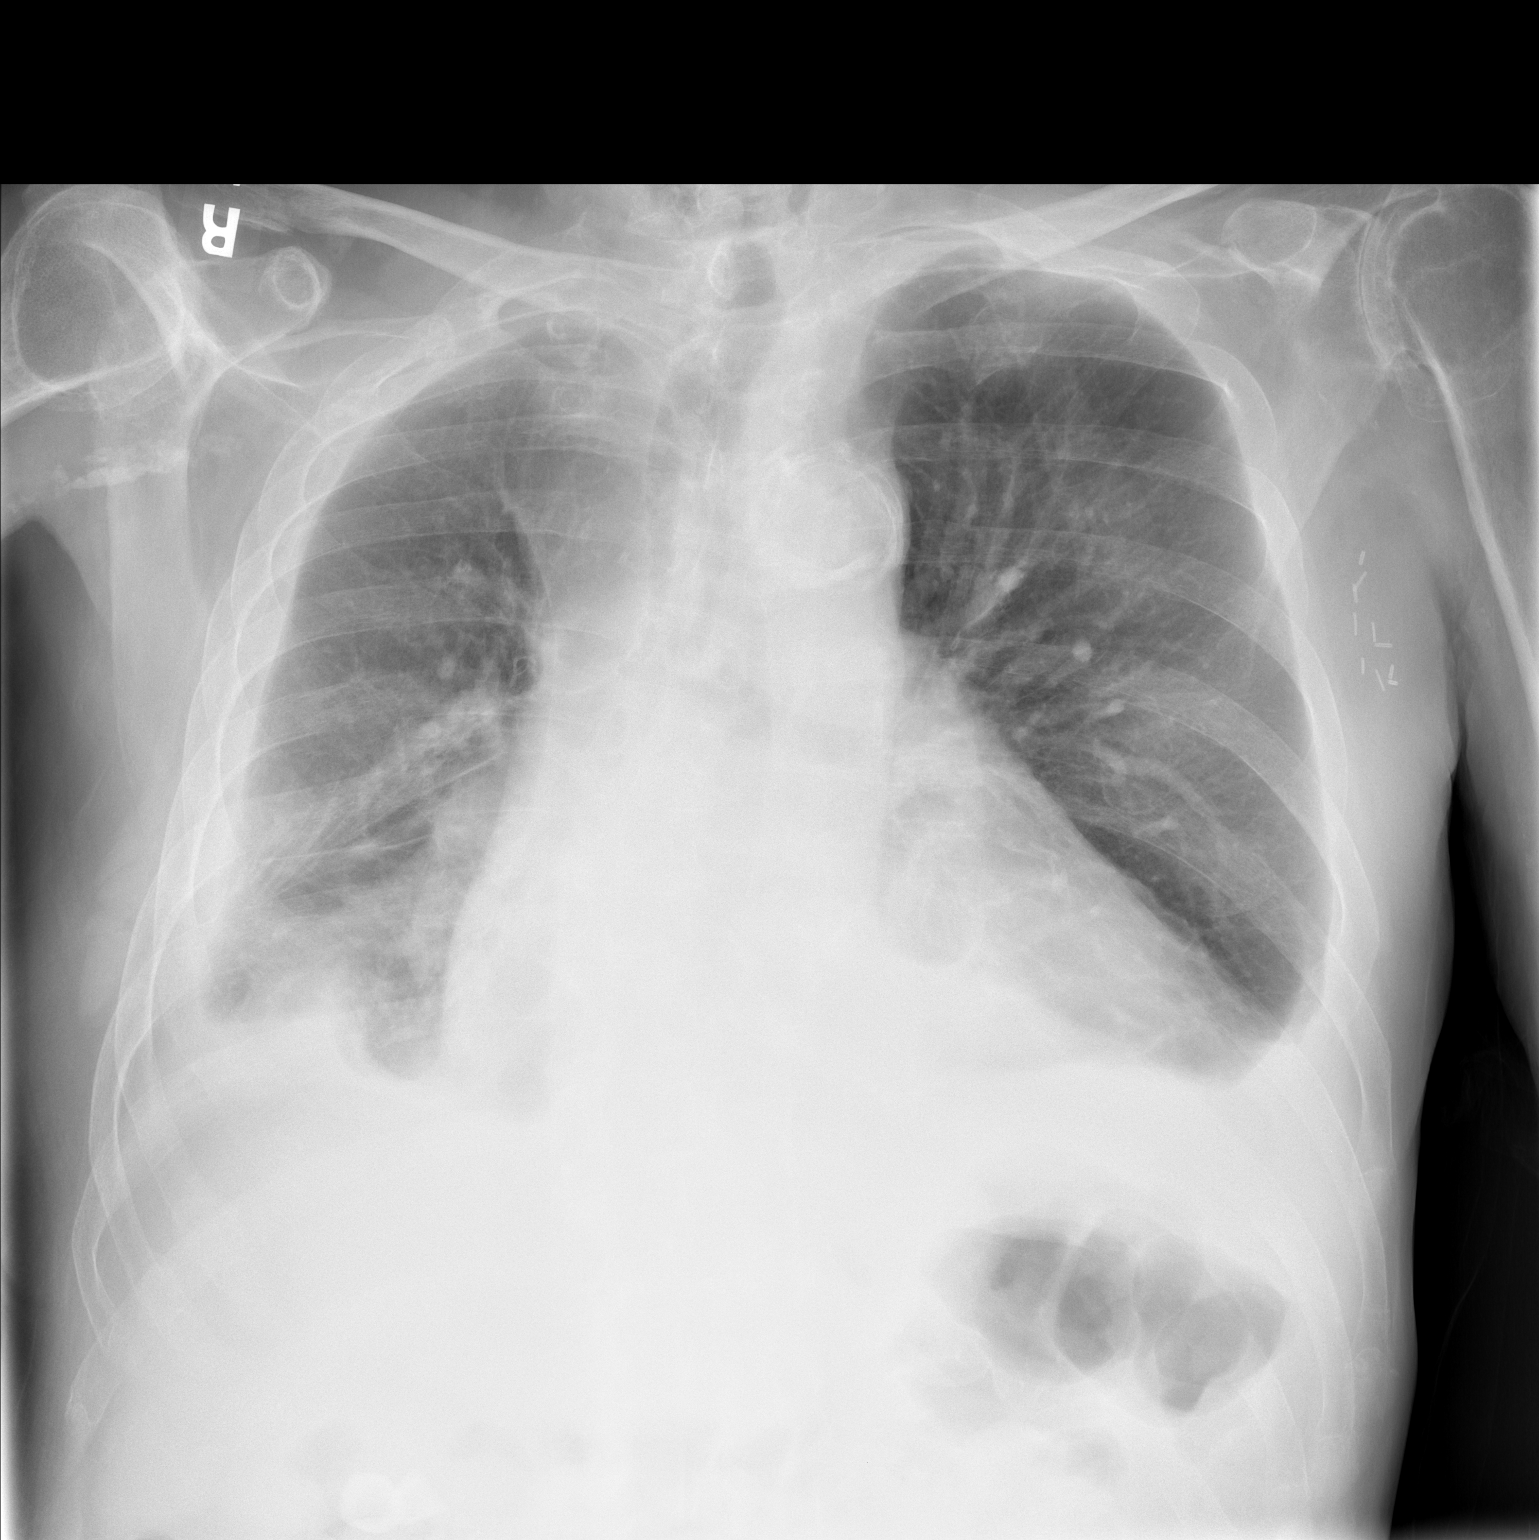

[2 of 2 positions shown; findings below may reference images not displayed]

FINDINGS: Chronic right pleural effusion with underlying chest opacification
reflecting collapse/scar based on [DATE] chest CT. There is a
small left pleural effusion. Cardiomegaly with vascular pedicle
widening and coronary atherosclerotic calcification. No superimposed
edema. No acute lung opacity.
IMPRESSION: Chronic left more than right pleural effusion and right lower lobe
opacification. No acute finding when compared to prior.
